# Patient Record
Sex: Male | Born: 1965 | Race: Black or African American | Hispanic: No | Marital: Single | Smoking: Former smoker
Health system: Southern US, Community
[De-identification: ages and names within clinical notes are randomized; demographics above are authoritative.]

## PROBLEM LIST (undated history)

## (undated) DIAGNOSIS — Z8679 Personal history of other diseases of the circulatory system: Secondary | ICD-10-CM

## (undated) DIAGNOSIS — I639 Cerebral infarction, unspecified: Secondary | ICD-10-CM

## (undated) DIAGNOSIS — I429 Cardiomyopathy, unspecified: Secondary | ICD-10-CM

## (undated) DIAGNOSIS — Z9889 Other specified postprocedural states: Secondary | ICD-10-CM

## (undated) DIAGNOSIS — I4729 Other ventricular tachycardia: Secondary | ICD-10-CM

## (undated) DIAGNOSIS — I10 Essential (primary) hypertension: Secondary | ICD-10-CM

## (undated) DIAGNOSIS — E785 Hyperlipidemia, unspecified: Secondary | ICD-10-CM

## (undated) HISTORY — DX: Cerebral infarction, unspecified: I63.9

## (undated) HISTORY — DX: Other ventricular tachycardia: I47.29

## (undated) HISTORY — DX: Cardiomyopathy, unspecified: I42.9

## (undated) HISTORY — DX: Personal history of other diseases of the circulatory system: Z98.890

## (undated) HISTORY — DX: Personal history of other diseases of the circulatory system: Z86.79

---

## 1998-10-24 ENCOUNTER — Emergency Department (HOSPITAL_COMMUNITY): Admission: EM | Admit: 1998-10-24 | Discharge: 1998-10-24 | Payer: Self-pay | Admitting: Emergency Medicine

## 1999-05-28 ENCOUNTER — Encounter: Payer: Self-pay | Admitting: Internal Medicine

## 1999-05-28 ENCOUNTER — Emergency Department (HOSPITAL_COMMUNITY): Admission: EM | Admit: 1999-05-28 | Discharge: 1999-05-28 | Payer: Self-pay | Admitting: Emergency Medicine

## 2010-05-07 ENCOUNTER — Ambulatory Visit (HOSPITAL_COMMUNITY)
Admission: RE | Admit: 2010-05-07 | Discharge: 2010-05-07 | Disposition: A | Discharge status: Home / Self Care | Payer: BC Managed Care – PPO | Source: Ambulatory Visit | Attending: Family Medicine | Admitting: Family Medicine

## 2010-05-07 ENCOUNTER — Other Ambulatory Visit (HOSPITAL_COMMUNITY): Payer: Self-pay | Admitting: Family Medicine

## 2010-05-07 DIAGNOSIS — R05 Cough: Secondary | ICD-10-CM

## 2010-05-07 DIAGNOSIS — R0600 Dyspnea, unspecified: Secondary | ICD-10-CM

## 2010-05-07 DIAGNOSIS — R059 Cough, unspecified: Secondary | ICD-10-CM

## 2010-05-07 DIAGNOSIS — R079 Chest pain, unspecified: Secondary | ICD-10-CM | POA: Insufficient documentation

## 2010-05-07 DIAGNOSIS — F17210 Nicotine dependence, cigarettes, uncomplicated: Secondary | ICD-10-CM

## 2010-05-07 DIAGNOSIS — R0602 Shortness of breath: Secondary | ICD-10-CM | POA: Insufficient documentation

## 2016-11-02 ENCOUNTER — Other Ambulatory Visit: Payer: Self-pay | Admitting: Podiatry

## 2016-11-04 NOTE — Patient Instructions (Addendum)
Erik Parker  11/04/2016     @   Your procedure is scheduled on  11/10/2016   Report to [facility] at  850  A.M.  Call this number if you have problems the morning of surgery:  [phone]   Remember:  Do not eat food or drink liquids after midnight.  Take these medicines the morning of surgery with A SIP OF WATER  Amlodipine   Do not wear jewelry, make-up or nail polish.  Do not wear lotions, powders, or perfumes, or deoderant.  Do not shave 48 hours prior to surgery.  Men may shave face and neck.  Do not bring valuables to the hospital.  [facility] is not responsible for any belongings or valuables.  Contacts, dentures or bridgework may not be worn into surgery.  Leave your suitcase in the car.  After surgery it may be brought to your room.  For patients admitted to the hospital, discharge time will be determined by your treatment team.  Patients discharged the day of surgery will not be allowed to drive home.   Name and phone number of your driver:   family Special instructions:  None  Please read over the following fact sheets that you were given. Anesthesia Post-op Instructions and Care and Recovery After Surgery       Heel Spur A heel spur is a bony growth that forms on the bottom of your heel bone (calcaneus). Heel spurs are common and do not always cause pain. However, heel spurs often cause inflammation in the strong band of tissue that runs underneath the bone of your foot (plantar fascia). When this happens, you may feel pain on the bottom of your foot, near your heel. What are the causes? The cause of heel spurs is not completely understood. They may be caused by pressure on the heel. Or, they may stem from the muscle attachments (tendons) near the spur pulling on the heel. What increases the risk? You may be at risk for a heel spur if you:  Are older than 40.  Are overweight.  Have wear and tear arthritis  (osteoarthritis).  Have plantar fascia inflammation.  What are the signs or symptoms? Some people have heel spurs but no symptoms. If you do have symptoms, they may include:  Pain in the bottom of your heel.  Pain that is worse when you first get out of bed.  Pain that gets worse after walking or standing.  How is this diagnosed? Your health care provider may diagnose a heel spur based on your symptoms and a physical exam. You may also have an X-ray of your foot to check for a bony growth coming from the calcaneus. How is this treated? Treatment aims to relieve the pain from the heel spur. This may include:  Stretching exercises.  Losing weight.  Wearing specific shoes, inserts, or orthotics for comfort and support.  Wearing splints at night to properly position your feet.  Taking over-the-counter medicine to relieve pain.  Being treated with high-intensity sound waves to break up the heel spur (extracorporeal shock wave therapy).  Getting steroid injections in your heel to reduce swelling and ease pain.  Having surgery if your heel spur causes long-term (chronic) pain.  Follow these instructions at home:  Take medicines only as directed by your health care provider.  Ask your health care provider if you should use ice or cold packs on the painful areas  of your heel or foot.  Avoid activities that cause you pain until you recover or as directed by your health care provider.  Stretch before exercising or being physically active.  Wear supportive shoes that fit well as directed by your health care provider. You might need to buy new shoes. Wearing old shoes or shoes that do not fit correctly may not provide the support that you need.  Lose weight if your health care provider thinks you should. This can relieve pressure on your foot that may be causing pain and discomfort. Contact a health care provider if:  Your pain continues or gets worse. This information is not  intended to replace advice given to you by your health care provider. Make sure you discuss any questions you have with your health care provider. Document Released: 03/24/2005 Document Revised: 07/24/2015 Document Reviewed: 04/18/2013 Elsevier Interactive Patient Education  2018 [employer].  [place] of the Foot, Care After This sheet gives you information about how to care for yourself after your procedure. Your health care provider may also give you more specific instructions. If you have problems or questions, contact your health care provider. What can I expect after the procedure? After the procedure, it is common to have:  Pain.  Swelling.  Stiffness.  Follow these instructions at home: If you have a splint or supportive shoe:  Wear the splint or shoe as told by your health care provider. Remove it only as told by your health care provider.  Loosen the splint or shoe if your toes tingle, become numb, or turn cold and blue.  Keep the splint or shoe clean.  If the splint or shoe is not waterproof: ? Do not let it get wet. ? Cover it with a watertight covering when you take a bath or a shower. If you have a cast:  Do not stick anything inside the cast to scratch your skin. Doing that increases your risk of infection.  Check the skin around the cast every day. Tell your health care provider about any concerns.  You may put lotion on dry skin around the edges of the cast. Do not put lotion on the skin underneath the cast.  Keep the cast clean.  If the cast is not waterproof: ? Do not let it get wet. ? Cover it with a watertight covering when you take a bath or a shower. Bathing  Do not take baths, swim, or use a hot tub until your health care provider approves. Ask your health care provider if you may take showers. You may only be allowed to take sponge baths for bathing.  If your cast or splint is not waterproof, cover it with a watertight covering when you take a  bath or a shower.  Keep the bandage (dressing) dry until your health care provider says it can be removed. Incision care  Follow instructions from your health care provider about how to take care of your incision. Make sure you: ? Wash your hands with soap and water before you change your bandage (dressing). If soap and water are not available, use hand sanitizer. ? Change your dressing as told by your health care provider. ? Leave stitches (sutures), skin glue, or adhesive strips in place. These skin closures may need to stay in place for 2 weeks or longer. If adhesive strip edges start to loosen and curl up, you may trim the loose edges. Do not remove adhesive strips completely unless your health care provider tells you to  do that.  Check your incision area every day for signs of infection. Check for: ? Redness, swelling, or pain. ? Fluid or blood. ? Warmth. ? Pus or a bad smell. Managing pain, stiffness, and swelling  If directed, put ice on the affected area. ? If you have a removable splint or shoe, remove it as told by your health care provider. ? Put ice in a plastic bag. ? Place a towel between your skin and the bag or between your cast and the bag. ? Leave the ice on for 20 minutes, 2-3 times a day.  Move your foot and toes often to avoid stiffness and to lessen swelling.  Raise (elevate) your foot area above the level of your heart while you are sitting or lying down. Driving  Do not drive or use heavy machinery while taking prescription pain medicine.  Ask your health care provider when it is safe to drive if you have a cast, splint, or supportive shoe on your foot. Activity  Return to your normal activities as told by your health care provider. Ask your health care provider what activities are safe for you.  Do exercises as told by your health care provider. Safety  Do not use your foot to support your body weight until your health care provider says that you  can.  Use your crutches or walker as told by your health care provider. General instructions  Do not put pressure on any part of the cast or splint until it is fully hardened. This may take several hours.  Do not use any products that contain nicotine or tobacco, such as cigarettes and e-cigarettes. These can delay bone healing. If you need help quitting, ask your health care provider.  Take over-the-counter and prescription medicines only as told by your health care provider.  Keep all follow-up visits as told by your health care provider. This is important. Contact a health care provider if:  You have chills or a fever.  Your pain is not controlled by your pain medicine.  You have redness, swelling, or pain around your incision.  You have fluid or blood coming from your incision.  Your incision feels warm to the touch.  You have pus or a bad smell coming from your incision. Get help right away if:  You have severe pain.  You have new pain, warmth, and swelling in your leg.  You have chest pain or difficulty breathing. Summary  After the procedure, it is common to have some pain, swelling, and stiffness.  Follow instructions from your health care provider about how to take care of your incision. Check the incision area every day for signs of infection.  Do not use your foot to support your body weight until your health care provider says that you can.  Move your foot and toes often to avoid stiffness and to lessen swelling. This information is not intended to replace advice given to you by your health care provider. Make sure you discuss any questions you have with your health care provider. Document Released: 04/06/2016 Document Revised: 04/06/2016 Document Reviewed: 04/06/2016 Elsevier Interactive Patient Education  2018 [employer]  Monitored Anesthesia Care Anesthesia is a term that refers to techniques, procedures, and medicines that help a person stay safe and  comfortable during a medical procedure. Monitored anesthesia care, or sedation, is one type of anesthesia. Your anesthesia specialist may recommend sedation if you will be having a procedure that does not require you to be unconscious, such as:  Cataract surgery.  A dental procedure.  A biopsy.  A colonoscopy.  During the procedure, you may receive a medicine to help you relax (sedative). There are three levels of sedation:  Mild sedation. At this level, you may feel awake and relaxed. You will be able to follow directions.  Moderate sedation. At this level, you will be sleepy. You may not remember the procedure.  Deep sedation. At this level, you will be asleep. You will not remember the procedure.  The more medicine you are given, the deeper your level of sedation will be. Depending on how you respond to the procedure, the anesthesia specialist may change your level of sedation or the type of anesthesia to fit your needs. An anesthesia specialist will monitor you closely during the procedure. Let your health care provider know about:  Any allergies you have.  All medicines you are taking, including vitamins, herbs, eye drops, creams, and over-the-counter medicines.  Any use of steroids (by mouth or as a cream).  Any problems you or family members have had with sedatives and anesthetic medicines.  Any blood disorders you have.  Any surgeries you have had.  Any medical conditions you have, such as sleep apnea.  Whether you are pregnant or may be pregnant.  Any use of cigarettes, alcohol, or street drugs. What are the risks? Generally, this is a safe procedure. However, problems may occur, including:  Getting too much medicine (oversedation).  Nausea.  Allergic reaction to medicines.  Trouble breathing. If this happens, a breathing tube may be used to help with breathing. It will be removed when you are awake and breathing on your own.  Heart trouble.  Lung  trouble.  Before the procedure Staying hydrated Follow instructions from your health care provider about hydration, which may include:  Up to 2 hours before the procedure - you may continue to drink clear liquids, such as water, clear fruit juice, black coffee, and plain tea.  Eating and drinking restrictions Follow instructions from your health care provider about eating and drinking, which may include:  8 hours before the procedure - stop eating heavy meals or foods such as meat, fried foods, or fatty foods.  6 hours before the procedure - stop eating light meals or foods, such as toast or cereal.  6 hours before the procedure - stop drinking milk or drinks that contain milk.  2 hours before the procedure - stop drinking clear liquids.  Medicines Ask your health care provider about:  Changing or stopping your regular medicines. This is especially important if you are taking diabetes medicines or blood thinners.  Taking medicines such as aspirin and ibuprofen. These medicines can thin your blood. Do not take these medicines before your procedure if your health care provider instructs you not to.  Tests and exams  You will have a physical exam.  You may have blood tests done to show: ? How well your kidneys and liver are working. ? How well your blood can clot.  General instructions  Plan to have someone take you home from the hospital or clinic.  If you will be going home right after the procedure, plan to have someone with you for 24 hours.  What happens during the procedure?  Your blood pressure, heart rate, breathing, level of pain and overall condition will be monitored.  An IV tube will be inserted into one of your veins.  Your anesthesia specialist will give you medicines as needed to keep you comfortable during the  procedure. This may mean changing the level of sedation.  The procedure will be performed. After the procedure  Your blood pressure, heart rate,  breathing rate, and blood oxygen level will be monitored until the medicines you were given have worn off.  Do not drive for 24 hours if you received a sedative.  You may: ? Feel sleepy, clumsy, or nauseous. ? Feel forgetful about what happened after the procedure. ? Have a sore throat if you had a breathing tube during the procedure. ? Vomit. This information is not intended to replace advice given to you by your health care provider. Make sure you discuss any questions you have with your health care provider. Document Released: 11/11/2004 Document Revised: 07/25/2015 Document Reviewed: 06/08/2015 Elsevier Interactive Patient Education  2018 [employer] Monitored Anesthesia Care, Care After These instructions provide you with information about caring for yourself after your procedure. Your health care provider may also give you more specific instructions. Your treatment has been planned according to current medical practices, but problems sometimes occur. Call your health care provider if you have any problems or questions after your procedure. What can I expect after the procedure? After your procedure, it is common to:  Feel sleepy for several hours.  Feel clumsy and have poor balance for several hours.  Feel forgetful about what happened after the procedure.  Have poor judgment for several hours.  Feel nauseous or vomit.  Have a sore throat if you had a breathing tube during the procedure.  Follow these instructions at home: For at least 24 hours after the procedure:   Do not: ? Participate in activities in which you could fall or become injured. ? Drive. ? Use heavy machinery. ? Drink alcohol. ? Take sleeping pills or medicines that cause drowsiness. ? Make important decisions or sign legal documents. ? Take care of children on your own.  Rest. Eating and drinking  Follow the diet that is recommended by your health care provider.  If you vomit, drink water,  juice, or soup when you can drink without vomiting.  Make sure you have little or no nausea before eating solid foods. General instructions  Have a responsible adult stay with you until you are awake and alert.  Take over-the-counter and prescription medicines only as told by your health care provider.  If you smoke, do not smoke without supervision.  Keep all follow-up visits as told by your health care provider. This is important. Contact a health care provider if:  You keep feeling nauseous or you keep vomiting.  You feel light-headed.  You develop a rash.  You have a fever. Get help right away if:  You have trouble breathing. This information is not intended to replace advice given to you by your health care provider. Make sure you discuss any questions you have with your health care provider. Document Released: 06/08/2015 Document Revised: 10/08/2015 Document Reviewed: 06/08/2015 Elsevier Interactive Patient Education  [employer].

## 2016-11-08 ENCOUNTER — Ambulatory Visit (HOSPITAL_COMMUNITY)
Admission: RE | Admit: 2016-11-08 | Discharge: 2016-11-08 | Disposition: A | Discharge status: Home / Self Care | Payer: No Typology Code available for payment source | Source: Ambulatory Visit | Attending: Podiatry | Admitting: Podiatry

## 2016-11-08 ENCOUNTER — Encounter (HOSPITAL_COMMUNITY): Payer: Self-pay

## 2016-11-08 ENCOUNTER — Encounter (HOSPITAL_COMMUNITY)
Admission: RE | Admit: 2016-11-08 | Discharge: 2016-11-08 | Disposition: A | Discharge status: Home / Self Care | Payer: No Typology Code available for payment source | Source: Ambulatory Visit | Attending: Podiatry | Admitting: Podiatry

## 2016-11-08 DIAGNOSIS — Z01810 Encounter for preprocedural cardiovascular examination: Secondary | ICD-10-CM | POA: Diagnosis not present

## 2016-11-08 DIAGNOSIS — Z01812 Encounter for preprocedural laboratory examination: Secondary | ICD-10-CM | POA: Diagnosis present

## 2016-11-08 DIAGNOSIS — M19071 Primary osteoarthritis, right ankle and foot: Secondary | ICD-10-CM | POA: Diagnosis not present

## 2016-11-08 DIAGNOSIS — R52 Pain, unspecified: Secondary | ICD-10-CM

## 2016-11-08 DIAGNOSIS — Z01818 Encounter for other preprocedural examination: Secondary | ICD-10-CM

## 2016-11-08 HISTORY — DX: Essential (primary) hypertension: I10

## 2016-11-08 HISTORY — DX: Hyperlipidemia, unspecified: E78.5

## 2016-11-08 LAB — CBC WITH DIFFERENTIAL/PLATELET
Basophils Absolute: 0 10*3/uL (ref 0.0–0.1)
Basophils Relative: 0 %
EOS PCT: 0 %
Eosinophils Absolute: 0 10*3/uL (ref 0.0–0.7)
HEMATOCRIT: 45 % (ref 39.0–52.0)
Hemoglobin: 15.1 g/dL (ref 13.0–17.0)
LYMPHS PCT: 31 %
Lymphs Abs: 2.7 10*3/uL (ref 0.7–4.0)
MCH: 29.3 pg (ref 26.0–34.0)
MCHC: 33.6 g/dL (ref 30.0–36.0)
MCV: 87.4 fL (ref 78.0–100.0)
MONO ABS: 0.4 10*3/uL (ref 0.1–1.0)
Monocytes Relative: 5 %
NEUTROS ABS: 5.6 10*3/uL (ref 1.7–7.7)
Neutrophils Relative %: 64 %
PLATELETS: 366 10*3/uL (ref 150–400)
RBC: 5.15 MIL/uL (ref 4.22–5.81)
RDW: 13.4 % (ref 11.5–15.5)
WBC: 8.7 10*3/uL (ref 4.0–10.5)

## 2016-11-08 LAB — BASIC METABOLIC PANEL
Anion gap: 12 (ref 5–15)
BUN: 16 mg/dL (ref 6–20)
CO2: 24 mmol/L (ref 22–32)
Calcium: 9.4 mg/dL (ref 8.9–10.3)
Chloride: 100 mmol/L — ABNORMAL LOW (ref 101–111)
Creatinine, Ser: 0.85 mg/dL (ref 0.61–1.24)
GFR calc Af Amer: >60 mL/min (ref >60)
GLUCOSE: 138 mg/dL — AB (ref 65–99)
Potassium: 3.5 mmol/L (ref 3.5–5.1)
Sodium: 136 mmol/L (ref 135–145)

## 2016-11-08 MED ORDER — CEFAZOLIN SODIUM-DEXTROSE 2-4 GM/100ML-% IV SOLN: 2.0000 g | INTRAVENOUS | Status: DC

## 2016-11-10 ENCOUNTER — Ambulatory Visit (HOSPITAL_COMMUNITY): Payer: Managed Care, Other (non HMO) | Admitting: Anesthesiology

## 2016-11-10 ENCOUNTER — Ambulatory Visit (HOSPITAL_COMMUNITY)
Admission: RE | Admit: 2016-11-10 | Discharge: 2016-11-10 | Disposition: A | Discharge status: Home / Self Care | Payer: Managed Care, Other (non HMO) | Source: Ambulatory Visit | Attending: Podiatry | Admitting: Podiatry

## 2016-11-10 ENCOUNTER — Encounter (HOSPITAL_COMMUNITY)
Admission: RE | Disposition: A | Discharge status: Home / Self Care | Payer: Self-pay | Source: Ambulatory Visit | Attending: Podiatry

## 2016-11-10 ENCOUNTER — Encounter (HOSPITAL_COMMUNITY): Payer: Self-pay | Admitting: *Deleted

## 2016-11-10 ENCOUNTER — Ambulatory Visit (HOSPITAL_COMMUNITY): Payer: Managed Care, Other (non HMO)

## 2016-11-10 DIAGNOSIS — Z9889 Other specified postprocedural states: Secondary | ICD-10-CM

## 2016-11-10 DIAGNOSIS — Z87891 Personal history of nicotine dependence: Secondary | ICD-10-CM | POA: Insufficient documentation

## 2016-11-10 DIAGNOSIS — M7661 Achilles tendinitis, right leg: Secondary | ICD-10-CM | POA: Insufficient documentation

## 2016-11-10 DIAGNOSIS — I10 Essential (primary) hypertension: Secondary | ICD-10-CM | POA: Diagnosis not present

## 2016-11-10 DIAGNOSIS — M9261 Juvenile osteochondrosis of tarsus, right ankle: Secondary | ICD-10-CM | POA: Diagnosis not present

## 2016-11-10 DIAGNOSIS — M7731 Calcaneal spur, right foot: Secondary | ICD-10-CM | POA: Insufficient documentation

## 2016-11-10 HISTORY — PX: ACHILLES TENDON SURGERY: SHX542

## 2016-11-10 HISTORY — PX: HEEL SPUR RESECTION: SHX6410

## 2016-11-10 SURGERY — EXCISION, BONE SPUR, CALCANEUS
Anesthesia: General | Laterality: Right

## 2016-11-10 MED ORDER — LACTATED RINGERS IV SOLN
INTRAVENOUS | Status: DC
  Administered 2016-11-10 (×3): via INTRAVENOUS

## 2016-11-10 MED ORDER — FENTANYL CITRATE (PF) 100 MCG/2ML IJ SOLN
INTRAMUSCULAR | Status: AC
  Filled 2016-11-10: qty 4

## 2016-11-10 MED ORDER — CEFAZOLIN SODIUM-DEXTROSE 1-4 GM/50ML-% IV SOLN
INTRAVENOUS | Status: AC
  Filled 2016-11-10: qty 50

## 2016-11-10 MED ORDER — LIDOCAINE HCL (PF) 1 % IJ SOLN
INTRAMUSCULAR | Status: AC
  Filled 2016-11-10: qty 30

## 2016-11-10 MED ORDER — ROCURONIUM BROMIDE 50 MG/5ML IV SOLN
INTRAVENOUS | Status: AC
  Filled 2016-11-10: qty 1

## 2016-11-10 MED ORDER — CEFAZOLIN SODIUM-DEXTROSE 2-4 GM/100ML-% IV SOLN
2.0000 g | Freq: Once | INTRAVENOUS | Status: AC
  Administered 2016-11-10: 1 g via INTRAVENOUS
  Administered 2016-11-10: 2 g via INTRAVENOUS
  Filled 2016-11-10: qty 100

## 2016-11-10 MED ORDER — FENTANYL CITRATE (PF) 100 MCG/2ML IJ SOLN
INTRAMUSCULAR | Status: DC | PRN
  Administered 2016-11-10 (×8): 50 ug via INTRAVENOUS

## 2016-11-10 MED ORDER — ONDANSETRON HCL 4 MG/2ML IJ SOLN
4.0000 mg | Freq: Once | INTRAMUSCULAR | Status: AC
  Administered 2016-11-10: 4 mg via INTRAVENOUS

## 2016-11-10 MED ORDER — FENTANYL CITRATE (PF) 100 MCG/2ML IJ SOLN
INTRAMUSCULAR | Status: AC
  Filled 2016-11-10: qty 2

## 2016-11-10 MED ORDER — LIDOCAINE HCL 1 % IJ SOLN
INTRAMUSCULAR | Status: DC | PRN
  Administered 2016-11-10: 18 mL

## 2016-11-10 MED ORDER — FENTANYL CITRATE (PF) 100 MCG/2ML IJ SOLN
25.0000 ug | INTRAMUSCULAR | Status: DC | PRN
  Administered 2016-11-10: 50 ug via INTRAVENOUS
  Filled 2016-11-10: qty 2

## 2016-11-10 MED ORDER — PROPOFOL 10 MG/ML IV BOLUS
INTRAVENOUS | Status: AC
  Filled 2016-11-10: qty 20

## 2016-11-10 MED ORDER — DEXTROSE 5 % IV SOLN: 2000.0000 mg | Freq: Once | INTRAVENOUS | Status: DC

## 2016-11-10 MED ORDER — PROPOFOL 10 MG/ML IV BOLUS
INTRAVENOUS | Status: DC | PRN
  Administered 2016-11-10: 50 mg via INTRAVENOUS
  Administered 2016-11-10: 200 mg via INTRAVENOUS

## 2016-11-10 MED ORDER — ROCURONIUM BROMIDE 100 MG/10ML IV SOLN
INTRAVENOUS | Status: DC | PRN
  Administered 2016-11-10: 10 mg via INTRAVENOUS
  Administered 2016-11-10: 30 mg via INTRAVENOUS
  Administered 2016-11-10: 10 mg via INTRAVENOUS

## 2016-11-10 MED ORDER — MIDAZOLAM HCL 2 MG/2ML IJ SOLN
INTRAMUSCULAR | Status: AC
  Filled 2016-11-10: qty 2

## 2016-11-10 MED ORDER — ONDANSETRON HCL 4 MG/2ML IJ SOLN
INTRAMUSCULAR | Status: AC
  Filled 2016-11-10: qty 2

## 2016-11-10 MED ORDER — SEVOFLURANE IN SOLN
RESPIRATORY_TRACT | Status: AC
  Filled 2016-11-10: qty 250

## 2016-11-10 MED ORDER — SUGAMMADEX SODIUM 200 MG/2ML IV SOLN
INTRAVENOUS | Status: DC | PRN
  Administered 2016-11-10: 245.8 mg via INTRAVENOUS

## 2016-11-10 MED ORDER — GLYCOPYRROLATE 0.2 MG/ML IJ SOLN
INTRAMUSCULAR | Status: DC | PRN
  Administered 2016-11-10: 0.2 mg via INTRAVENOUS

## 2016-11-10 MED ORDER — SUCCINYLCHOLINE CHLORIDE 20 MG/ML IJ SOLN
INTRAMUSCULAR | Status: DC | PRN
  Administered 2016-11-10: 180 mg via INTRAVENOUS

## 2016-11-10 MED ORDER — BUPIVACAINE HCL (PF) 0.5 % IJ SOLN
INTRAMUSCULAR | Status: AC
  Filled 2016-11-10: qty 30

## 2016-11-10 MED ORDER — MIDAZOLAM HCL 2 MG/2ML IJ SOLN
1.0000 mg | INTRAMUSCULAR | Status: AC
  Administered 2016-11-10 (×2): 2 mg via INTRAVENOUS
  Filled 2016-11-10: qty 2

## 2016-11-10 MED ORDER — SUGAMMADEX SODIUM 500 MG/5ML IV SOLN
INTRAVENOUS | Status: AC
  Filled 2016-11-10: qty 5

## 2016-11-10 SURGICAL SUPPLY — 57 items
BAG HAMPER (MISCELLANEOUS) ×3 IMPLANT
BANDAGE ACE 4 STERILE (GAUZE/BANDAGES/DRESSINGS) ×6 IMPLANT
BANDAGE ACE 6X5 VEL STRL LF (GAUZE/BANDAGES/DRESSINGS) ×3 IMPLANT
BANDAGE ELASTIC 4 VELCRO NS (GAUZE/BANDAGES/DRESSINGS) IMPLANT
BANDAGE ESMARK 4X12 BL STRL LF (DISPOSABLE) ×1 IMPLANT
BENZOIN TINCTURE PRP APPL 2/3 (GAUZE/BANDAGES/DRESSINGS) ×3 IMPLANT
BLADE 15 SAFETY STRL DISP (BLADE) IMPLANT
BLADE AVERAGE 25MMX9MM (BLADE) ×1
BLADE AVERAGE 25X9 (BLADE) ×2 IMPLANT
BLADE OSC/SAG 18.5X9 THN (BLADE) IMPLANT
BNDG CONFORM 2 STRL LF (GAUZE/BANDAGES/DRESSINGS) IMPLANT
BNDG ESMARK 4X12 BLUE STRL LF (DISPOSABLE) ×3
BNDG GAUZE ELAST 4 BULKY (GAUZE/BANDAGES/DRESSINGS) ×3 IMPLANT
BNDG GAUZE ROLL STR 2.25X3YD (GAUZE/BANDAGES/DRESSINGS) ×3 IMPLANT
BOOT STEPPER DURA LG (SOFTGOODS) IMPLANT
BOOT STEPPER DURA MED (SOFTGOODS) IMPLANT
BOOT STEPPER DURA SM (SOFTGOODS) IMPLANT
BOOT STEPPER DURA XLG (SOFTGOODS) ×3 IMPLANT
CHLORAPREP W/TINT 26ML (MISCELLANEOUS) ×3 IMPLANT
CLOSURE WOUND 1/2 X4 (GAUZE/BANDAGES/DRESSINGS)
CLOTH BEACON ORANGE TIMEOUT ST (SAFETY) ×3 IMPLANT
COVER LIGHT HANDLE STERIS (MISCELLANEOUS) ×6 IMPLANT
CUFF TOURNIQUET SINGLE 18IN (TOURNIQUET CUFF) IMPLANT
CUFF TOURNIQUET SINGLE 34IN LL (TOURNIQUET CUFF) ×3 IMPLANT
DECANTER SPIKE VIAL GLASS SM (MISCELLANEOUS) ×6 IMPLANT
DRAPE OEC MINIVIEW 54X84 (DRAPES) ×3 IMPLANT
DRSG ADAPTIC 3X8 NADH LF (GAUZE/BANDAGES/DRESSINGS) ×3 IMPLANT
ELECT REM PT RETURN 9FT ADLT (ELECTROSURGICAL) ×3
ELECTRODE REM PT RTRN 9FT ADLT (ELECTROSURGICAL) ×1 IMPLANT
GAUZE SPONGE 4X4 12PLY STRL (GAUZE/BANDAGES/DRESSINGS) ×3 IMPLANT
GAUZE SPONGE 4X4 12PLY STRL LF (GAUZE/BANDAGES/DRESSINGS) ×3 IMPLANT
GLOVE BIO SURGEON STRL SZ7.5 (GLOVE) ×6 IMPLANT
GLOVE BIOGEL PI IND STRL 7.0 (GLOVE) ×1 IMPLANT
GLOVE BIOGEL PI INDICATOR 7.0 (GLOVE) ×2
GOWN STRL REUS W/TWL LRG LVL3 (GOWN DISPOSABLE) ×12 IMPLANT
IMPL SYS BIOCOMP ACH SPEED (Anchor) ×1 IMPLANT
IMPLANT SYS BIOCOMP ACH SPEED (Anchor) ×3 IMPLANT
KIT ROOM TURNOVER AP CYSTO (KITS) IMPLANT
KIT ROOM TURNOVER APOR (KITS) ×3 IMPLANT
MANIFOLD NEPTUNE II (INSTRUMENTS) ×3 IMPLANT
NEEDLE HYPO 18GX1.5 BLUNT FILL (NEEDLE) IMPLANT
NEEDLE HYPO 27GX1-1/4 (NEEDLE) IMPLANT
NS IRRIG 1000ML POUR BTL (IV SOLUTION) ×3 IMPLANT
PACK BASIC LIMB (CUSTOM PROCEDURE TRAY) ×3 IMPLANT
PAD ARMBOARD 7.5X6 YLW CONV (MISCELLANEOUS) ×3 IMPLANT
RASP SM TEAR CROSS CUT (RASP) ×3 IMPLANT
SET BASIN LINEN APH (SET/KITS/TRAYS/PACK) ×3 IMPLANT
SPONGE LAP 18X18 X RAY DECT (DISPOSABLE) ×3 IMPLANT
STAPLER VISISTAT (STAPLE) ×3 IMPLANT
STRIP CLOSURE SKIN 1/2X4 (GAUZE/BANDAGES/DRESSINGS) IMPLANT
SUT ETHIBOND 3 0 (SUTURE) IMPLANT
SUT PROLENE 4 0 PS 2 18 (SUTURE) ×6 IMPLANT
SUT VIC AB 2-0 CT2 27 (SUTURE) ×3 IMPLANT
SUT VIC AB 4-0 PS2 27 (SUTURE) ×3 IMPLANT
SUT VICRYL AB 3-0 FS1 BRD 27IN (SUTURE) ×3 IMPLANT
SYR CONTROL 10ML LL (SYRINGE) ×9 IMPLANT
TOWEL OR 17X26 4PK STRL BLUE (TOWEL DISPOSABLE) ×3 IMPLANT

## 2016-11-10 NOTE — Op Note (Signed)
2:03 PM  PATIENT:  Erik Parker  51 y.o. male  PRE-OPERATIVE DIAGNOSIS:  painful haglunds deformity right foot, achilles tendonitis right foot, retrocalcaneal heel spur right foot  POST-OPERATIVE DIAGNOSIS:  painful haglunds deformity right foot, achilles tendonitis right foot, retrocalcaneal heel spur right foot  PROCEDURE:  Procedure(s): HEEL SPUR RESECTION (HAGLUNDS DEFORMITY) (Right) ACHILLES TENDON REPAIR VIA SPEED BRIDGE (Right)  SURGEON:  Surgeon(s) and Role:    * [name], DPM - Primary    * Ferman Hamming, DPM - Assisting  PHYSICIAN ASSISTANT:   ASSISTANTS: none   ANESTHESIA:   general  EBL:  Total I/O In: 1600 [I.V.:1600] Out: 2 [Blood:2]  BLOOD ADMINISTERED:none  DRAINS: none   LOCAL MEDICATIONS USED:  BUPIVICAINE   SPECIMEN:  No Specimen  DISPOSITION OF SPECIMEN:  N/A  COUNTS:  YES  TOURNIQUET:   Total Tourniquet Time Documented: area (laterality) - 105 minutes Total: area (laterality) - 105 minutes   DICTATION: .Dragon Dictation  PLAN OF CARE: Discharge to home after PACU  PATIENT DISPOSITION:  PACU - hemodynamically stable.   Delay start of Pharmacological VTE agent (>24hrs) due to surgical blood loss or risk of bleeding: no  Patient was brought into the operating room. Following IV sedation and General anesthesia patient was laid prone on to the operating table. Care was made to pad all the bony prominence.  Thigh tourniquet was applied to the surgical extremity. The foot and ankle were the prepped, scrubbed and draped in aseptic manner. Using an esmarch band the tourniquet on the surgical site was inflatted at .   Attention was directed toward right posterior heel. There was prominent bump visualized with skin discoloration at the posterior superior aspect of the calcaneus. A midline incision approach was planned. A cm midline incision to achilles tendon was made down to skin and subcutaneous tissue with full flap at the  inferior aspect of the incision. Care was made to preserve the paratenon. Next the paratenon was dissected off carefully from achilles tendon. There was fragments of bony exostosis felt with the tendon and most inferior part of the incision. The achilles tendon was now visualized and thickened at the most inferior part. Achilles tendon was split in midline to the incision in full thickness, from posterior to anterior.  All tendinopathic tissue was removed from the tendon and around the tendon.  Achilles tendon distally was now reflected medially and laterally and expose the whole calcaneal tuberosity with a Haglund's prominence. Care was maintained to save medial  and lateral attachments to assist with the accurate restoration of the Achilles' length. At this time the Haglund's prominence using a microsagittal saw. Power rasp was used to rasp down any bony prominence. Fluoroscopy was used to see if any bony prominent left. At this time surgical site was flushed with normal saline.  At this bone was ready to insert the speedbride and reattach the achilles tendon.  Two (2) 4.75 mm BioComposite SwiveLock anchors were drilled about 1cm apart from each other using drill guide from the set. The 2 holes were about 1 cm proximal to the distal insertion of the Achilles tendon and central to each half of the tendon. Then 4.75 mm SwiveLock tap was used to prepare the holes for the 4.75 mm SwiveLock anchors. Inserted two (2) 4.75 mm BioComposite SwiveLock anchors loaded with FiberTape suture, 1 blue and 1 white/black, into the proximal holes. Anchors were noted to be flushed with bone. The anchor driver was then removed and needle attached to  the 2mm fiber tape were sutured through the achilles tendon on each side. At this time the distal holes were drilled with 3.5 mm drill in the same manner as the proximal holes. Holes were tapped using 4.6275mm SwiveLock anchors. After cutting the swedged portion on each proximal  anchor, retrieved 1 FiberTape suture tail from each proximal anchor (1 blue and 1 white/black) and preloaded them through the distal SwiveLock anchor eyelet. Tension of the FiberTape suture was adjusted and inserted the 4.75 mm SwiveLock anchor into the prepared distal bone socket until the anchor body contacts bone. Anchors were flushed to the bone. The tails on the distal row were cut and anchor was flushed to the bone. Fluoroscopy was used to confirm the position of the anchors. Tendon was firmly attached and strength was tested on the table. Remaining achilles tendon was suture using 2-0 fiber tape from the speedbridge set. The paratenon was closed using 4-0 Vicryl in running fashion. Sub cuticular stiches were done to close the skin using 3-0 Vicryl. Staples were also used for the skin. 18cc of mixture of 1:1 1% plain lidocaine with 0.5%marcaine was injected into the surgical site. Dry sterile dressing was applied. Fibreglass posterior splint was applied to the surgical extremity in plantar flexion position. Tourniquet was deflated. Capillary refill was immediate to all the toes. Patient was extubated and transferred to PACU. Vitals are stable. Patient to be nonweightbearing for 3 weeks and using crutches on surgical extremity.

## 2016-11-10 NOTE — Anesthesia Preprocedure Evaluation (Signed)
Anesthesia Evaluation  Patient identified by MRN, date of birth, ID band Patient awake    Reviewed: Allergy & Precautions, NPO status , Patient's Chart, lab work & pertinent test results  Airway Mallampati: II  TM Distance: >3 FB     Dental  (+) Teeth Intact   Pulmonary former smoker,    breath sounds clear to auscultation       Cardiovascular hypertension, Pt. on medications  Rhythm:Regular Rate:Normal     Neuro/Psych negative neurological ROS  negative psych ROS   GI/Hepatic negative GI ROS, Neg liver ROS,   Endo/Other  negative endocrine ROS  Renal/GU negative Renal ROS     Musculoskeletal   Abdominal   Peds  Hematology negative hematology ROS (+)   Anesthesia Other Findings   Reproductive/Obstetrics                             Anesthesia Physical Anesthesia Plan  ASA: II  Anesthesia Plan: General   Post-op Pain Management:    Induction: Intravenous  PONV Risk Score and Plan:   Airway Management Planned: Oral ETT  Additional Equipment:   Intra-op Plan:   Post-operative Plan: Extubation in OR  Informed Consent: I have reviewed the patients History and Physical, chart, labs and discussed the procedure including the risks, benefits and alternatives for the proposed anesthesia with the patient or authorized representative who has indicated his/her understanding and acceptance.     Plan Discussed with:   Anesthesia Plan Comments: (Prone position)        Anesthesia Quick Evaluation

## 2016-11-10 NOTE — Anesthesia Procedure Notes (Signed)
Procedure Name: Intubation Date/Time: 11/10/2016 11:17 AM Performed by: Pernell DupreADAMS, Pattricia Weiher A Pre-anesthesia Checklist: Patient identified, Patient being monitored, Timeout performed, Emergency Drugs available and Suction available Patient Re-evaluated:Patient Re-evaluated prior to induction Oxygen Delivery Method: Circle System Utilized Preoxygenation: Pre-oxygenation with 100% oxygen Induction Type: IV induction, Rapid sequence and Cricoid Pressure applied Laryngoscope Size: Miller and 3 Grade View: Grade III Tube type: Oral Tube size: 7.0 mm Number of attempts: 1 Airway Equipment and Method: Stylet Placement Confirmation: ETT inserted through vocal cords under direct vision,  positive ETCO2 and breath sounds checked- equal and bilateral Secured at: 21 cm Tube secured with: Tape Dental Injury: Teeth and Oropharynx as per pre-operative assessment

## 2016-11-10 NOTE — Transfer of Care (Signed)
Immediate Anesthesia Transfer of Care Note  Patient: Erik Parker  Procedure(s) Performed: Procedure(s): HEEL SPUR RESECTION (HAGLUNDS DEFORMITY) (Right) ACHILLES TENDON REPAIR VIA SPEED BRIDGE (Right)  Patient Location: PACU  Anesthesia Type:General  Level of Consciousness: awake, oriented and patient cooperative  Airway & Oxygen Therapy: Patient Spontanous Breathing and Patient connected to nasal cannula oxygen  Post-op Assessment: Report given to RN and Post -op Vital signs reviewed and stable  Post vital signs: Reviewed and stable  Last Vitals:  Vitals:   11/10/16 1100 11/10/16 1105  BP: 124/81 120/79  Pulse:    Resp: (!) 31 (!) 24  Temp:    SpO2: 98% 97%    Last Pain:  Vitals:   11/10/16 0935  TempSrc: Oral  PainSc: 6          Complications: No apparent anesthesia complications

## 2016-11-10 NOTE — H&P (Signed)
.   HISTORY AND PHYSICAL INTERVAL NOTE:  11/10/2016  10:21 AM  Leonides Grillsony V Hufnagle  has presented today for surgery, with the diagnosis of painful haglunds deformity right foot, achilles tendonitis right foot, retrocalcaneal heel spur right foot.  The various methods of treatment have been discussed with the patient.  No guarantees were given.  After consideration of risks, benefits and other options for treatment, the patient has consented to surgery.  I have reviewed the patients' chart and labs.    Patient Vitals for the past 24 hrs:  BP Temp Temp src Pulse SpO2 Height Weight  11/10/16 0935 132/86 98.5 F (36.9 C) Oral 82 98 % 6' (1.829 m) 122.9 kg (271 lb)    A history and physical examination was performed in my office.  The patient was reexamined.  There have been no changes to this history and physical examination.  Erskine EmeryPrayashkumar Gaspard Isbell, DPM

## 2016-11-10 NOTE — Discharge Instructions (Signed)
These instructions will give you an idea of what to expect after surgery and how to manage issues that may arise before your first post op office visit. ° °Pain Management °Pain is best managed by “staying ahead” of it. If pain gets out of control, it is difficult to get it back under control. Local anesthesia that lasts 6-8 hours is used to numb the foot and decrease pain.  For the best pain control, take the pain medication every 4 hours for the first 2 days post op. On the third day pain medication can be taken as needed.  ° °Post Op Nausea °Nausea is common after surgery, so it is managed proactively.  °If prescribed, use the prescribed nausea medication regularly for the first 2 days post op. ° °Bandages °Do not worry if there is blood on the bandage. What looks like a lot of blood on the bandage is actually a small amount. Blood on the dressing spreads out as it is absorbed by the gauze, the same way a drop of water spreads out on a paper towel.  °If the bandages feel wet or dry, stiff and uncomfortable, call the office during office hours and we will schedule a time for you to have the bandage changed.  °Unless you are specifically told otherwise, we will do the first bandage change in the office.  °Keep your bandage dry. If the bandage becomes wet or soiled, notify the office and we will schedule a time to change the bandage. ° °Activity °It is best to spend most of the first 2 days after surgery lying down with the foot elevated above the level of your heart. °You may put weight on your heel while wearing the surgical shoe.   °You may only get up to go to the restroom. ° °Driving °Do not drive until you are able to respond in an emergency (i.e. slam on the brakes). This usually occurs after the bone has healed - 6 to 8 weeks. ° °Call the Office °If you have a fever over 101°F.  °If you have increasing pain after the initial post op pain has settled down.  °If you have increasing redness, swelling, or  drainage.  °If you have any questions or concerns.  ° ° °PATIENT INSTRUCTIONS °POST-ANESTHESIA ° °IMMEDIATELY FOLLOWING SURGERY:  Do not drive or operate machinery for the first twenty four hours after surgery.  Do not make any important decisions for twenty four hours after surgery or while taking narcotic pain medications or sedatives.  If you develop intractable nausea and vomiting or a severe headache please notify your doctor immediately. ° °FOLLOW-UP:  Please make an appointment with your surgeon as instructed. You do not need to follow up with anesthesia unless specifically instructed to do so. ° °WOUND CARE INSTRUCTIONS (if applicable):  Keep a dry clean dressing on the anesthesia/puncture wound site if there is drainage.  Once the wound has quit draining you may leave it open to air.  Generally you should leave the bandage intact for twenty four hours unless there is drainage.  If the epidural site drains for more than 36-48 hours please call the anesthesia department. ° °QUESTIONS?:  Please feel free to call your physician or the hospital operator if you have any questions, and they will be happy to assist you.    ° ° ° °

## 2016-11-10 NOTE — Brief Op Note (Signed)
11/10/2016  2:03 PM  PATIENT:  Erik Parker  51 y.o. male  PRE-OPERATIVE DIAGNOSIS:  painful haglunds deformity right foot, achilles tendonitis right foot, retrocalcaneal heel spur right foot  POST-OPERATIVE DIAGNOSIS:  painful haglunds deformity right foot, achilles tendonitis right foot, retrocalcaneal heel spur right foot  PROCEDURE:  Procedure(s): HEEL SPUR RESECTION (HAGLUNDS DEFORMITY) (Right) ACHILLES TENDON REPAIR VIA SPEED BRIDGE (Right)  SURGEON:  Surgeon(s) and Role:    * Erskine EmeryPatel, Kaitlyn Skowron, DPM - Primary    * Ferman HammingMcKinney, Benjamin, DPM - Assisting  PHYSICIAN ASSISTANT:   ASSISTANTS: none   ANESTHESIA:   general  EBL:  Total I/O In: 1600 [I.V.:1600] Out: 2 [Blood:2]  BLOOD ADMINISTERED:none  DRAINS: none   LOCAL MEDICATIONS USED:  BUPIVICAINE   SPECIMEN:  No Specimen  DISPOSITION OF SPECIMEN:  N/A  COUNTS:  YES  TOURNIQUET:   Total Tourniquet Time Documented: area (laterality) - 105 minutes Total: area (laterality) - 105 minutes   DICTATION: .Dragon Dictation  PLAN OF CARE: Discharge to home after PACU  PATIENT DISPOSITION:  PACU - hemodynamically stable.   Delay start of Pharmacological VTE agent (>24hrs) due to surgical blood loss or risk of bleeding: no

## 2016-11-10 NOTE — Anesthesia Postprocedure Evaluation (Signed)
Anesthesia Post Note Late Entry 1440  Patient: Erik Parker  Procedure(s) Performed: Procedure(s) (LRB): HEEL SPUR RESECTION (HAGLUNDS DEFORMITY) (Right) ACHILLES TENDON REPAIR VIA SPEED BRIDGE (Right)  Patient location during evaluation: PACU Anesthesia Type: General Level of consciousness: awake and alert, oriented and patient cooperative Pain management: pain level controlled Vital Signs Assessment: post-procedure vital signs reviewed and stable Respiratory status: spontaneous breathing Cardiovascular status: stable Postop Assessment: no signs of nausea or vomiting Anesthetic complications: no     Last Vitals:  Vitals:   11/10/16 1430 11/10/16 1445  BP: 134/86 136/82  Pulse: 79 78  Resp: 20 17  Temp:    SpO2: 95% 97%    Last Pain:  Vitals:   11/10/16 1445  TempSrc:   PainSc: 4                  [name] A

## 2016-11-10 NOTE — Brief Op Note (Signed)
11/10/2016  2:01 PM  PATIENT:  Erik Parker  51 y.o. male  PRE-OPERATIVE DIAGNOSIS:  painful haglunds deformity right foot, achilles tendonitis right foot, retrocalcaneal heel spur right foot  POST-OPERATIVE DIAGNOSIS:  painful haglunds deformity right foot, achilles tendonitis right foot, retrocalcaneal heel spur right foot  PROCEDURE:  Procedure(s): HEEL SPUR RESECTION (HAGLUNDS DEFORMITY) (Right) ACHILLES TENDON REPAIR VIA SPEED BRIDGE (Right)  SURGEON:  Surgeon(s) and Role:    * Erskine EmeryPatel, Sinthia Karabin, DPM - Primary    * Ferman HammingMcKinney, Benjamin, DPM - Assisting  PHYSICIAN ASSISTANT:   ASSISTANTS: none   ANESTHESIA:   general  EBL:  Total I/O In: 1600 [I.V.:1600] Out: 2 [Blood:2]  BLOOD ADMINISTERED:none  DRAINS: none   LOCAL MEDICATIONS USED:  BUPIVICAINE   SPECIMEN:  No Specimen  DISPOSITION OF SPECIMEN:  N/A  COUNTS:  YES  TOURNIQUET:   Total Tourniquet Time Documented: area (laterality) - 105 minutes Total: area (laterality) - 105 minutes   DICTATION: .Dragon Dictation  PLAN OF CARE: Discharge to home after PACU  PATIENT DISPOSITION:  PACU - hemodynamically stable.   Delay start of Pharmacological VTE agent (>24hrs) due to surgical blood loss or risk of bleeding: no

## 2016-11-11 ENCOUNTER — Encounter (HOSPITAL_COMMUNITY): Payer: Self-pay | Admitting: Podiatry

## 2018-11-20 ENCOUNTER — Emergency Department (HOSPITAL_COMMUNITY)
Admission: EM | Admit: 2018-11-20 | Discharge: 2018-11-20 | Disposition: A | Discharge status: Home / Self Care | Payer: 59 | Attending: Emergency Medicine | Admitting: Emergency Medicine

## 2018-11-20 ENCOUNTER — Emergency Department (HOSPITAL_COMMUNITY): Payer: 59

## 2018-11-20 ENCOUNTER — Other Ambulatory Visit: Payer: Self-pay

## 2018-11-20 ENCOUNTER — Encounter (HOSPITAL_COMMUNITY): Payer: Self-pay | Admitting: Emergency Medicine

## 2018-11-20 DIAGNOSIS — Z79899 Other long term (current) drug therapy: Secondary | ICD-10-CM | POA: Diagnosis not present

## 2018-11-20 DIAGNOSIS — I10 Essential (primary) hypertension: Secondary | ICD-10-CM | POA: Diagnosis not present

## 2018-11-20 DIAGNOSIS — M79651 Pain in right thigh: Secondary | ICD-10-CM | POA: Insufficient documentation

## 2018-11-20 DIAGNOSIS — Z87891 Personal history of nicotine dependence: Secondary | ICD-10-CM | POA: Diagnosis not present

## 2018-11-20 DIAGNOSIS — M79604 Pain in right leg: Secondary | ICD-10-CM

## 2018-11-20 LAB — CBC WITH DIFFERENTIAL/PLATELET
Abs Immature Granulocytes: 0.07 10*3/uL (ref 0.00–0.07)
Basophils Absolute: 0.1 10*3/uL (ref 0.0–0.1)
Basophils Relative: 1 %
Eosinophils Absolute: 0.1 10*3/uL (ref 0.0–0.5)
Eosinophils Relative: 1 %
HCT: 46.7 % (ref 39.0–52.0)
Hemoglobin: 14.9 g/dL (ref 13.0–17.0)
Immature Granulocytes: 1 %
Lymphocytes Relative: 17 %
Lymphs Abs: 1.9 10*3/uL (ref 0.7–4.0)
MCH: 27.7 pg (ref 26.0–34.0)
MCHC: 31.9 g/dL (ref 30.0–36.0)
MCV: 87 fL (ref 80.0–100.0)
Monocytes Absolute: 0.7 10*3/uL (ref 0.1–1.0)
Monocytes Relative: 7 %
Neutro Abs: 8.3 10*3/uL — ABNORMAL HIGH (ref 1.7–7.7)
Neutrophils Relative %: 73 %
Platelets: 407 10*3/uL — ABNORMAL HIGH (ref 150–400)
RBC: 5.37 MIL/uL (ref 4.22–5.81)
RDW: 13.2 % (ref 11.5–15.5)
WBC: 11.1 10*3/uL — ABNORMAL HIGH (ref 4.0–10.5)
nRBC: 0 % (ref 0.0–0.2)

## 2018-11-20 LAB — BASIC METABOLIC PANEL
Anion gap: 7 (ref 5–15)
BUN: 15 mg/dL (ref 6–20)
CO2: 25 mmol/L (ref 22–32)
Calcium: 9.3 mg/dL (ref 8.9–10.3)
Chloride: 106 mmol/L (ref 98–111)
Creatinine, Ser: 0.68 mg/dL (ref 0.61–1.24)
GFR calc Af Amer: >60 mL/min (ref >60)
GFR calc non Af Amer: >60 mL/min (ref >60)
Glucose, Bld: 132 mg/dL — ABNORMAL HIGH (ref 70–99)
Potassium: 3.9 mmol/L (ref 3.5–5.1)
Sodium: 138 mmol/L (ref 135–145)

## 2018-11-20 MED ORDER — HYDROMORPHONE HCL 1 MG/ML IJ SOLN
1.0000 mg | Freq: Once | INTRAMUSCULAR | Status: AC
  Administered 2018-11-20: 1 mg via INTRAMUSCULAR
  Filled 2018-11-20: qty 1

## 2018-11-20 MED ORDER — MELOXICAM 15 MG PO TABS: 15.0000 mg | ORAL_TABLET | Freq: Every day | ORAL | 0 refills | Status: DC | PRN

## 2018-11-20 NOTE — ED Notes (Signed)
Pt to ultrasound

## 2018-11-20 NOTE — ED Triage Notes (Signed)
Pt states he thought he pulled a muscle in his R leg a week ago but that pain has gotten worse and travels from his thigh to his ankle.

## 2018-11-20 NOTE — ED Notes (Signed)
Have notified pt about delay of ultrasound. Verbalized understanding.

## 2018-11-20 NOTE — ED Provider Notes (Signed)
[facility] EMERGENCY DEPARTMENT Provider Note   CSN: [id number] Arrival date & time: 11/20/18  0554     History   Chief Complaint Chief Complaint  Patient presents with  . Leg Injury    HPI Erik Parker is a 53 y.o. male.     The history is provided by the patient.  Leg Pain Location:  Leg Leg location:  R upper leg Pain details:    Quality:  Aching   Radiates to:  R leg   Severity:  Severe   Onset quality:  Sudden   Timing:  Constant   Progression:  Worsening Chronicity:  New Relieved by:  Nothing Worsened by:  Bearing weight Associated symptoms: no back pain, no fatigue, no fever and no muscle weakness   Patient with history of hyperlipidemia hypertension presents with right thigh pain.  He reports he got off work last night without any issue.  He woke up in overnight with severe pain in his right anterior thigh that radiates this is lower leg.  No falls or trauma.  No fevers or vomiting.  No Chest pain/shortness of breath.  Reports it hurts to walk.  He has been using crutches to help with ambulation No h/o CAD/VTE Past Medical History:  Diagnosis Date  . Hyperlipidemia   . Hypertension     There are no active problems to display for this patient.   Past Surgical History:  Procedure Laterality Date  . ACHILLES TENDON SURGERY Right 11/10/2016   Procedure: ACHILLES TENDON REPAIR VIA SPEED BRIDGE;  Surgeon: [name], DPM;  Location: [facility];  Service: Podiatry;  Laterality: Right;  . HEEL SPUR RESECTION Right 11/10/2016   Procedure: HEEL SPUR RESECTION (HAGLUNDS DEFORMITY);  Surgeon: [name], DPM;  Location: [facility];  Service: Podiatry;  Laterality: Right;        Home Medications    Prior to Admission medications   Medication Sig Start Date End Date Taking? Authorizing Provider  amLODipine (NORVASC) 10 MG tablet Take 10 mg by mouth daily. 10/12/16   [provider]  hydrochlorothiazide (HYDRODIURIL) 25 MG tablet Take 25 mg by  mouth daily.    [provider]  lovastatin (MEVACOR) 20 MG tablet Take 20 mg by mouth at bedtime.    [provider]    Family History No family history on file.  Social History Social History   Tobacco Use  . Smoking status: Former Smoker    Packs/day: 0.50    Years: 9.00    Pack years: 4.50    Types: Cigarettes    Quit date: 10/08/2016    Years since quitting: 2.1  . Smokeless tobacco: Never Used  Substance Use Topics  . Alcohol use: Yes    Comment: occ  . Drug use: No     Allergies   Patient has no known allergies.   Review of Systems Review of Systems  Constitutional: Negative for fatigue and fever.  Respiratory: Negative for shortness of breath.   Cardiovascular: Negative for chest pain.  Musculoskeletal: Positive for arthralgias and myalgias. Negative for back pain.  Neurological: Negative for weakness.  All other systems reviewed and are negative.    Physical Exam Updated Vital Signs BP (!) 137/94   Pulse 79   Temp 98.2 F (36.8 C)   Resp 18   Ht 1.854 m (6\' 1" )   Wt 114.3 kg   SpO2 99%   BMI 33.25 kg/m   Physical Exam CONSTITUTIONAL: Well developed/well nourished HEAD: Normocephalic/atraumatic EYES: EOMI  ENMT: Mask in place NECK: supple no meningeal signs SPINE/BACK:entire spine nontender CV: S1/S2 noted, no murmurs/rubs/gallops noted LUNGS: Lungs are clear to auscultation bilaterally, no apparent distress ABDOMEN: soft, nontender, no rebound or guarding, bowel sounds noted throughout abdomen, no RLQ tenderness GU:no cva tenderness NEURO: Pt is awake/alert/appropriate, moves all extremitiesx4.  No facial droop.  He has appropriate strength with dorsiflexion and plantar flexion in right foot. He has limitation in range of motion of right hip and right knee due to significant pain in right thigh EXTREMITIES: pulses normal/equal, full ROM, equal femoral pulses noted without any thrill noted.  Distal pulses noted in right foot.   There is significant tenderness throughout the right anterior thigh, but no erythema or overlying skin changes.  No crepitus.  No signs of trauma, no deformities No joint effusions to the right lower extremity No lower extremity edema SKIN: warm, color normal PSYCH: no abnormalities of mood noted, alert and oriented to situation   ED Treatments / Results  Labs (all labs ordered are listed, but only abnormal results are displayed) Labs Reviewed  CBC WITH DIFFERENTIAL/PLATELET - Abnormal; Notable for the following components:      Result Value   WBC 11.1 (*)    Platelets 407 (*)    Neutro Abs 8.3 (*)    All other components within normal limits  BASIC METABOLIC PANEL    EKG None  Radiology No results found.  Procedures Procedures  Medications Ordered in ED Medications  HYDROmorphone (DILAUDID) injection 1 mg (has no administration in time range)     Initial Impression / Assessment and Plan / ED Course  I have reviewed the triage vital signs and the nursing notes.        6:43 AM Patient presents for atraumatic right thigh pain.  He has significant tenderness throughout the thigh.  He is neurovascularly intact Will proceed with DVT study 7:03 AM Signed out to dr Juleen Chinakohut at shift change to f/u on DVT study  Final Clinical Impressions(s) / ED Diagnoses   Final diagnoses:  None    ED Discharge Orders    None       Zadie RhineWickline, Ekaterina Denise, MD 11/20/18 [phone]

## 2019-02-26 ENCOUNTER — Telehealth: Payer: Self-pay | Admitting: Neurology

## 2019-02-26 ENCOUNTER — Other Ambulatory Visit: Payer: Self-pay

## 2019-02-26 ENCOUNTER — Encounter: Payer: Self-pay | Admitting: Neurology

## 2019-02-26 ENCOUNTER — Ambulatory Visit (INDEPENDENT_AMBULATORY_CARE_PROVIDER_SITE_OTHER): Payer: 59 | Admitting: Neurology

## 2019-02-26 VITALS — BP 130/73 | HR 72 | Temp 97.9°F | Ht 73.0 in | Wt 253.0 lb

## 2019-02-26 DIAGNOSIS — R200 Anesthesia of skin: Secondary | ICD-10-CM | POA: Insufficient documentation

## 2019-02-26 DIAGNOSIS — R29898 Other symptoms and signs involving the musculoskeletal system: Secondary | ICD-10-CM

## 2019-02-26 NOTE — Patient Instructions (Signed)
Carpal Tunnel Syndrome  Carpal tunnel syndrome is a condition that causes pain in your hand and arm. The carpal tunnel is a narrow area that is on the palm side of your wrist. Repeated wrist motion or certain diseases may cause swelling in the tunnel. This swelling can pinch the main nerve in the wrist (median nerve). What are the causes? This condition may be caused by:  Repeated wrist motions.  Wrist injuries.  Arthritis.  A sac of fluid (cyst) or abnormal growth (tumor) in the carpal tunnel.  Fluid buildup during pregnancy. Sometimes the cause is not known. What increases the risk? The following factors may make you more likely to develop this condition:  Having a job in which you move your wrist in the same way many times. This includes jobs like being a butcher or a cashier.  Being a woman.  Having other health conditions, such as: ? Diabetes. ? Obesity. ? A thyroid gland that is not active enough (hypothyroidism). ? Kidney failure. What are the signs or symptoms? Symptoms of this condition include:  A tingling feeling in your fingers.  Tingling or a loss of feeling (numbness) in your hand.  Pain in your entire arm. This pain may get worse when you bend your wrist and elbow for a long time.  Pain in your wrist that goes up your arm to your shoulder.  Pain that goes down into your palm or fingers.  A weak feeling in your hands. You may find it hard to grab and hold items. You may feel worse at night. How is this diagnosed? This condition is diagnosed with a medical history and physical exam. You may also have tests, such as:  Electromyogram (EMG). This test checks the signals that the nerves send to the muscles.  Nerve conduction study. This test checks how well signals pass through your nerves.  Imaging tests, such as X-rays, ultrasound, and MRI. These tests check for what might be the cause of your condition. How is this treated? This condition may be treated  with:  Lifestyle changes. You will be asked to stop or change the activity that caused your problem.  Doing exercise and activities that make bones and muscles stronger (physical therapy).  Learning how to use your hand again (occupational therapy).  Medicines for pain and swelling (inflammation). You may have injections in your wrist.  A wrist splint.  Surgery. Follow these instructions at home: If you have a splint:  Wear the splint as told by your doctor. Remove it only as told by your doctor.  Loosen the splint if your fingers: ? Tingle. ? Lose feeling (become numb). ? Turn cold and blue.  Keep the splint clean.  If the splint is not waterproof: ? Do not let it get wet. ? Cover it with a watertight covering when you take a bath or a shower. Managing pain, stiffness, and swelling   If told, put ice on the painful area: ? If you have a removable splint, remove it as told by your doctor. ? Put ice in a plastic bag. ? Place a towel between your skin and the bag. ? Leave the ice on for 20 minutes, 2-3 times per day. General instructions  Take over-the-counter and prescription medicines only as told by your doctor.  Rest your wrist from any activity that may cause pain. If needed, talk with your boss at work about changes that can help your wrist heal.  Do any exercises as told by your doctor,   physical therapist, or occupational therapist.  Keep all follow-up visits as told by your doctor. This is important. Contact a doctor if:  You have new symptoms.  Medicine does not help your pain.  Your symptoms get worse. Get help right away if:  You have very bad numbness or tingling in your wrist or hand. Summary  Carpal tunnel syndrome is a condition that causes pain in your hand and arm.  It is often caused by repeated wrist motions.  Lifestyle changes and medicines are used to treat this problem. Surgery may help in very bad cases.  Follow your doctor's  instructions about wearing a splint, resting your wrist, keeping follow-up visits, and calling for help. This information is not intended to replace advice given to you by your health care provider. Make sure you discuss any questions you have with your health care provider. Document Released: 02/04/2011 Document Revised: 06/24/2017 Document Reviewed: 06/24/2017 [employer] Patient Education  2020 [employer]  

## 2019-02-26 NOTE — Progress Notes (Signed)
Provider:  Melvyn Novasarmen  Conley Pawling, M D  Referring Provider: [facility] Care Physician:  [facility].   Chief Complaint  Patient presents with  . New Patient (Initial Visit)    pt alone, rm 10. pt states he has developed pain in hand, he has decreased in grip strength. this all started about a couple weeks ago. the 2 middle on bilateral hands feels tight  and numbness in bilateral hands.     HPI:  Erik Grillsony V Parker is a 53 y.o. male  Is seen here as a referral from Dr. Lenise HeraldBenjamin Mann at [facility].   Mr. Manson PasseyBrown presents with a rather acute onset of inability to completely make a fist and flex his fingers on both hands.  He has not noted any kind of muscle atrophy, fever, there was no rash.   The only new medication that he takes was given to him on 21 December and that is prednisone 5 mg as a Dosepak- total daily dose for over 3 or 4 days prior to his primary care visit on 15 December. There has been no cough, not fever before this started, just muscle ache and no joint pain. No headaches. Nobody in his family had Coronavirus infection- to his knowledge.   He has been on Tadenafil, sildenafil, lovastatin, hydrochlorothiazide, aspirin and amlodipine for many many months.  There have been no recent change in dose prescription medications.  He did feel however that his pian improved a little with steroids- not much- the pain is worse at night- and tolerable at daytime .    Family and social history. 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  Review of Systems: Out of a complete 14 system review, the patient complains of only the following symptoms, and all other  reviewed systems are negative.  muslcle pain in hands, swelling, inability to make a fist.   Social History   Socioeconomic History  . Marital status: Single    Spouse name: Not on file  . Number of children: Not on file  . Years of education: Not on file  . Highest education level: Not on file  Occupational History  . Not on file  Tobacco Use  . Smoking status: Former Smoker    Packs/day: 0.50    Years: 9.00    Pack years: 4.50    Types: Cigarettes    Quit date: 10/08/2016    Years since quitting: 2.3  . Smokeless tobacco: Never Used  Substance and Sexual Activity  . Alcohol use: Yes    Comment: occ  . Drug use: No  . Sexual activity: Not on file  Other Topics Concern  . Not on file  Social History Narrative  . Not on file   Social Determinants of Health   Financial Resource Strain:   . Difficulty of Paying Living Expenses: Not on file  Food Insecurity:   . Worried About Programme researcher, broadcasting/film/videounning Out of Food in the Last Year: Not on file  . Ran Out of Food in the Last Year: Not on file  Transportation Needs:   . Lack of Transportation (Medical): Not on file  . Lack of Transportation (Non-Medical): Not  on file  Physical Activity:   . Days of Exercise per Week: Not on file  . Minutes of Exercise per Session: Not on file  Stress:   . Feeling of Stress : Not on file  Social Connections:   . Frequency of Communication with Friends and Family: Not on file  . Frequency of Social Gatherings with Friends and Family: Not on file  . Attends Religious Services: Not on file  . Active Member of Clubs or Organizations: Not on file  . Attends Archivist Meetings: Not on file  . Marital Status: Not on file  Intimate Partner Violence:   . Fear of Current or Ex-Partner: Not on file  . Emotionally Abused: Not on file  . Physically Abused: Not on file  . Sexually Abused: Not on file    No family history on file.  Past Medical History:  Diagnosis Date  . Hyperlipidemia   .  Hypertension     Past Surgical History:  Procedure Laterality Date  . ACHILLES TENDON SURGERY Right 11/10/2016   Procedure: ACHILLES TENDON REPAIR VIA SPEED BRIDGE;  Surgeon: [name], DPM;  Location: [facility];  Service: Podiatry;  Laterality: Right;  . HEEL SPUR RESECTION Right 11/10/2016   Procedure: HEEL SPUR RESECTION (HAGLUNDS DEFORMITY);  Surgeon: [name], DPM;  Location: [facility];  Service: Podiatry;  Laterality: Right;    Current Outpatient Medications  Medication Sig Dispense Refill  . amLODipine (NORVASC) 10 MG tablet Take 10 mg by mouth daily.  0  . aspirin EC 81 MG tablet Take 81 mg by mouth daily.    . hydrochlorothiazide (HYDRODIURIL) 25 MG tablet Take 25 mg by mouth daily.    Marland Kitchen lovastatin (MEVACOR) 40 MG tablet Take 20 mg by mouth at bedtime.     . predniSONE (DELTASONE) 5 MG tablet TAKE 6 TABS X 2 DAYS, 5 TAB X 2 DAYS, 4 TAB X 2 DAYS, 3 TAB X 2 DAYS, 2 TAB X 2 DAYS, 1 TAB X 2 DAYS     No current facility-administered medications for this visit.    Allergies as of 02/26/2019  . (No Known Allergies)    Vitals: BP 130/73   Pulse 72   Temp 97.9 F (36.6 C)   Ht 6\' 1"  (1.854 m)   Wt 253 lb (114.8 kg)   BMI 33.38 kg/m  Last Weight:  Wt Readings from Last 1 Encounters:  02/26/19 253 lb (114.8 kg)   Last Height:   Ht Readings from Last 1 Encounters:  02/26/19 6\' 1"  (1.854 m)    Physical exam:  General: The patient is awake, alert and appears not in acute distress. The patient is well groomed. Head: Normocephalic, atraumatic. Neck is supple. Cardiovascular:  Regular rate and rhythm, without  murmurs or carotid bruit, and without distended neck veins. Respiratory: Lungs are clear to auscultation. Skin:  Without evidence of edema, or rash - fingers appear swollen.  Trunk: BMI is 33,  elevated and patient  has normal posture.  Neurologic exam : The patient is awake and alert, oriented to place and time.  Memory subjective  described as intact.  There is a normal attention span & concentration ability. Speech is fluent without dysarthria, dysphonia or aphasia. Mood and affect are appropriate.  Cranial nerves: No LOSS of smell or taste reported.  Pupils are equal and briskly reactive to light. Funduscopic exam deferred. Extraocular movements  in vertical and horizontal planes intact and without nystagmus.  Visual fields by finger perimetry  are intact. Hearing to finger rub intact.  Facial sensation intact to fine touch. Facial motor strength is symmetric and tongue and uvula move midline. Tongue protrusion into either cheek is normal. Shoulder shrug is normal.   Motor exam:   Normal tone ,muscle bulk and symmetric strength in all proximal extremities, and both feet. Restricted grip strength. .  Sensory:  Fine touch, pinprick and vibration were tested in all extremities. Both hands are numb to fine touch and feel hot to the touch, he reports pain when I closed his hand, passive ROM is not restricted but painful. Proprioception was normal.  Coordination: Rapid alternating movements in the fingers/hands were slightly slowed. Finger-to-nose maneuver normal without evidence of ataxia, dysmetria or tremor.  Gait and station: Patient walks without assistive device and is able unassisted to climb up to the exam table. Strength within normal limits. Stance is stable and normal. Tandem gait is unfragmented. Romberg testing is negative   Deep tendon reflexes: in the  upper and lower extremities are symmetric and intact. Babinski maneuver response is downgoing.   Assessment:  After physical and neurologic examination, review of laboratory studies, imaging, neurophysiology testing and pre-existing records, assessment is that of :   Bilateral hand swelling and numbness, painful restriction of movements. Myositis?  Has positive  C-tunnel symptoms, tinnel sign  but he can move freely at both wrists - concerning is the acute onset in all 5 fingers , both  hands.   Nightly pain shooting up to the area below elbow- this patient  is right side dominant.   He has taken a multivitamin and now is still on prednisone. He had started on 6 tabs of 5 mg , reduced to 5 tabs and then 4  tab of 5 mg, now on 3 of 5 mg in AM only.    Plan:  Treatment plan and additional workup :  Soft wrist braces at night, may be at work.  Continue and finish steroids.  EMG and NCV for both hands ASAP.  CK CKMB and sed rate < C reactive protein.  Thyroid function and CMET , ruling out endocrinological disorder.       Porfirio Mylar Reyanna Baley MD 02/26/2019

## 2019-02-26 NOTE — Telephone Encounter (Signed)
[facility] called and relayed CT CERVICAL SPINE W WO CONTRAST  Only can be done with out contrast . If any question's you can call Wells Guiles and [facility] CT Tech's [phone] .  Please re- order if Dr. Brett Fairy agree's . Thanks Hinton Dyer

## 2019-02-27 ENCOUNTER — Other Ambulatory Visit: Payer: Self-pay | Admitting: Neurology

## 2019-02-27 DIAGNOSIS — R29898 Other symptoms and signs involving the musculoskeletal system: Secondary | ICD-10-CM

## 2019-02-27 DIAGNOSIS — R200 Anesthesia of skin: Secondary | ICD-10-CM

## 2019-02-27 LAB — COMPREHENSIVE METABOLIC PANEL
ALT: 38 IU/L (ref 0–44)
AST: 16 IU/L (ref 0–40)
Albumin/Globulin Ratio: 1.6 (ref 1.2–2.2)
Albumin: 4.8 g/dL (ref 3.8–4.9)
Alkaline Phosphatase: 98 IU/L (ref 39–117)
BUN/Creatinine Ratio: 21 — ABNORMAL HIGH (ref 9–20)
BUN: 16 mg/dL (ref 6–24)
Bilirubin Total: 0.3 mg/dL (ref 0.0–1.2)
CO2: 23 mmol/L (ref 20–29)
Calcium: 10.2 mg/dL (ref 8.7–10.2)
Chloride: 99 mmol/L (ref 96–106)
Creatinine, Ser: 0.77 mg/dL (ref 0.76–1.27)
GFR calc Af Amer: 121 mL/min/{1.73_m2} (ref >59)
GFR calc non Af Amer: 105 mL/min/{1.73_m2} (ref >59)
Globulin, Total: 3 g/dL (ref 1.5–4.5)
Glucose: 112 mg/dL — ABNORMAL HIGH (ref 65–99)
Potassium: 4.5 mmol/L (ref 3.5–5.2)
Sodium: 140 mmol/L (ref 134–144)
Total Protein: 7.8 g/dL (ref 6.0–8.5)

## 2019-02-27 LAB — SEDIMENTATION RATE: Sed Rate: 45 mm/hr — ABNORMAL HIGH (ref 0–30)

## 2019-02-27 LAB — C-REACTIVE PROTEIN: CRP: 6 mg/L (ref 0–10)

## 2019-02-27 NOTE — Telephone Encounter (Signed)
I have corrected the order to reflect CT cervical neck without contrast.

## 2019-02-28 ENCOUNTER — Telehealth: Payer: Self-pay | Admitting: Neurology

## 2019-02-28 NOTE — Telephone Encounter (Signed)
-----   Message from Larey Seat, MD sent at 02/27/2019 12:03 PM EST ----- Elevated sed rate, normal kidney function.   Please share with PCP.

## 2019-02-28 NOTE — Telephone Encounter (Signed)
Called the patient to inform that lab work results were completed and read. Advised that kidney function looked good but the patient sed rate was elevated. Advised that Dr Brett Fairy wanted me to send the results to A PCP and he informed me to send the results to Dr Collene Mares at [facility]. Pt also has a apt coming up with rheumatology.

## 2019-03-07 ENCOUNTER — Ambulatory Visit
Admission: RE | Admit: 2019-03-07 | Discharge: 2019-03-07 | Disposition: A | Discharge status: Home / Self Care | Payer: 59 | Source: Ambulatory Visit | Attending: Neurology | Admitting: Neurology

## 2019-03-07 ENCOUNTER — Other Ambulatory Visit: Payer: Self-pay

## 2019-03-07 DIAGNOSIS — R29898 Other symptoms and signs involving the musculoskeletal system: Secondary | ICD-10-CM

## 2019-03-07 DIAGNOSIS — R200 Anesthesia of skin: Secondary | ICD-10-CM

## 2019-03-09 ENCOUNTER — Telehealth: Payer: Self-pay | Admitting: Neurology

## 2019-03-09 NOTE — Telephone Encounter (Signed)
Called the patient. There was no answer. LVM detailed informing the result of the CT findings and encouraged the patient to call back with any questions he may have.

## 2019-03-09 NOTE — Telephone Encounter (Signed)
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 Mild left foraminal narrowing C7-T1 due to facet hypertrophy and spurring. Expected findings in a 54 year old male.

## 2019-04-12 ENCOUNTER — Encounter (INDEPENDENT_AMBULATORY_CARE_PROVIDER_SITE_OTHER): Payer: 59 | Admitting: Diagnostic Neuroimaging

## 2019-04-12 ENCOUNTER — Ambulatory Visit (INDEPENDENT_AMBULATORY_CARE_PROVIDER_SITE_OTHER): Payer: 59 | Admitting: Diagnostic Neuroimaging

## 2019-04-12 ENCOUNTER — Other Ambulatory Visit: Payer: Self-pay

## 2019-04-12 DIAGNOSIS — R29898 Other symptoms and signs involving the musculoskeletal system: Secondary | ICD-10-CM

## 2019-04-12 DIAGNOSIS — Z0289 Encounter for other administrative examinations: Secondary | ICD-10-CM

## 2019-04-12 DIAGNOSIS — R200 Anesthesia of skin: Secondary | ICD-10-CM

## 2019-04-12 NOTE — Procedures (Signed)
[facility]  NCS (NERVE CONDUCTION STUDY) WITH EMG (ELECTROMYOGRAPHY) REPORT   STUDY DATE: 04/12/19 PATIENT NAME: Erik Parker DOB: 02/23/1966 MRN: [id number]  ORDERING CLINICIAN: C Dohmeier  TECHNOLOGIST: Sherre Scarlet ELECTROMYOGRAPHER: Earlean Polka. Aryannah Mohon, MD  CLINICAL INFORMATION: 54 year old male with bilateral hand numbness.  FINDINGS: NERVE CONDUCTION STUDY:  Bilateral median motor responses are prolonged distal latencies (right 11.1 ms, left 9.1 ms, normal less than equal to 4.4 ms). Right median motor response has decreased amplitude and slow conduction velocity.  Left median motor response has normal amplitude and conduction velocity.  Bilateral ulnar motor responses are normal.  Bilateral ulnar F-wave latencies are normal.  Bilateral median sensory sponsors could not be obtained.  Bilateral ulnar sensory responses are normal.   NEEDLE ELECTROMYOGRAPHY:  Needle examination of right upper extremity is normal.   IMPRESSION:   Abnormal study demonstrating: - Bilateral median neuropathies at the wrist consistent with bilateral carpal tunnel syndrome.   INTERPRETING PHYSICIAN:  Penni Bombard, MD Certified in Neurology, Neurophysiology and Neuroimaging  [facility] [address] [place], [place] [address] [phone]  [facility]    Nerve / Sites Muscle Latency Ref. Amplitude Ref. Rel Amp Segments Distance Velocity Ref. Area    ms ms mV mV %  cm m/s m/s mVms  L Median - APB     Wrist APB 9.1 ?4.4 4.8 ?4.0 100 Wrist - APB 7   16.9     Upper arm APB 14.0  4.6  94.1 Upper arm - Wrist 24 49 ?49 15.7  R Median - APB     Wrist APB 11.1 ?4.4 0.8 ?4.0 100 Wrist - APB 7   2.3     Upper arm APB 16.4  0.9  119 Upper arm - Wrist 24 45 ?49 2.9  L Ulnar - ADM     Wrist ADM 2.6 ?3.3 10.5 ?6.0 100 Wrist - ADM 7   33.3     B.Elbow ADM 6.9  9.7  91.6 B.Elbow - Wrist 24 57 ?49 32.8     A.Elbow ADM 8.7  9.1  94.7 A.Elbow - B.Elbow  10 55 ?49 32.1         A.Elbow - Wrist      R Ulnar - ADM     Wrist ADM 2.7 ?3.3 11.0 ?6.0 100 Wrist - ADM 7   31.6     B.Elbow ADM 6.7  10.6  96.4 B.Elbow - Wrist 24 60 ?49 30.5     A.Elbow ADM 8.3  10.1  95.1 A.Elbow - B.Elbow 10 60 ?49 30.1         A.Elbow - Wrist                 SNC    Nerve / Sites Rec. Site Peak Lat Ref.  Amp Ref. Segments Distance    ms ms V V  cm  L Median - Orthodromic (Dig II, Mid palm)     Dig II Wrist NR ?3.4 NR ?10 Dig II - Wrist 13  R Median - Orthodromic (Dig II, Mid palm)     Dig II Wrist NR ?3.4 NR ?10 Dig II - Wrist 13  L Ulnar - Orthodromic, (Dig V, Mid palm)     Dig V Wrist 2.8 ?3.1 9 ?5 Dig V - Wrist 11  R Ulnar - Orthodromic, (Dig V, Mid palm)     Dig V Wrist 2.7 ?3.1 7 ?5 Dig V - Wrist 11  F  Wave    Nerve F Lat Ref.   ms ms  L Ulnar - ADM 31.3 ?32.0  R Ulnar - ADM 30.7 ?32.0         EMG Summary Table    Spontaneous MUAP Recruitment  Muscle IA Fib PSW Fasc Other Amp Dur. Poly Pattern  R. Deltoid Normal None None None _______ Normal Normal Normal Normal  R. Biceps brachii Normal None None None _______ Normal Normal Normal Normal  R. Triceps brachii Normal None None None _______ Normal Normal Normal Normal  R. Flexor carpi radialis Normal None None None _______ Normal Normal Normal Normal  R. First dorsal interosseous Normal None None None _______ Normal Normal Normal Normal

## 2019-04-13 NOTE — Progress Notes (Signed)
Bilateral median neuropathies at the wrist consistent with  bilateral carpal tunnel syndrome.   I recommend wrist splints, no need for surgery or other intervention.

## 2019-04-16 ENCOUNTER — Telehealth: Payer: Self-pay | Admitting: Neurology

## 2019-04-16 NOTE — Telephone Encounter (Signed)
Called the patient to review the Nerve conduction study results. There was no answer. LVM asking for the patient to call back.   **If pt calls back please advise that the nerve conduction study did show that he had areas on both wrist areas that appear to be consistent with carpal tunnel syndrome. At this time she doesn't feel that surgery is needed but would recommend the patient purchases splints for both wrist to help with this. We will review in more detail at his follow up visit 2/25 with Amy.

## 2019-04-16 NOTE — Telephone Encounter (Signed)
-----   Message from Melvyn Novas, MD sent at 04/13/2019  9:33 AM EST ----- Bilateral median neuropathies at the wrist consistent with  bilateral carpal tunnel syndrome.   I recommend wrist splints, no need for surgery or other intervention.

## 2019-04-26 ENCOUNTER — Ambulatory Visit (INDEPENDENT_AMBULATORY_CARE_PROVIDER_SITE_OTHER): Payer: 59 | Admitting: Family Medicine

## 2019-04-26 ENCOUNTER — Encounter: Payer: Self-pay | Admitting: Family Medicine

## 2019-04-26 ENCOUNTER — Other Ambulatory Visit: Payer: Self-pay

## 2019-04-26 VITALS — BP 130/81 | HR 65 | Temp 97.4°F | Ht 72.0 in | Wt 262.2 lb

## 2019-04-26 DIAGNOSIS — G5603 Carpal tunnel syndrome, bilateral upper limbs: Secondary | ICD-10-CM

## 2019-04-26 NOTE — Patient Instructions (Signed)
Wear wrist brace as often as you can. I recommend referral to neurosurgery for consideration of carpal tunnel release  Follow up as needed   Carpal Tunnel Syndrome  Carpal tunnel syndrome is a condition that causes pain in your hand and arm. The carpal tunnel is a narrow area that is on the palm side of your wrist. Repeated wrist motion or certain diseases may cause swelling in the tunnel. This swelling can pinch the main nerve in the wrist (median nerve). What are the causes? This condition may be caused by:  Repeated wrist motions.  Wrist injuries.  Arthritis.  A sac of fluid (cyst) or abnormal growth (tumor) in the carpal tunnel.  Fluid buildup during pregnancy. Sometimes the cause is not known. What increases the risk? The following factors may make you more likely to develop this condition:  Having a job in which you move your wrist in the same way many times. This includes jobs like being a Software engineer or a Scientist, water quality.  Being a woman.  Having other health conditions, such as: ? Diabetes. ? Obesity. ? A thyroid gland that is not active enough (hypothyroidism). ? Kidney failure. What are the signs or symptoms? Symptoms of this condition include:  A tingling feeling in your fingers.  Tingling or a loss of feeling (numbness) in your hand.  Pain in your entire arm. This pain may get worse when you bend your wrist and elbow for a long time.  Pain in your wrist that goes up your arm to your shoulder.  Pain that goes down into your palm or fingers.  A weak feeling in your hands. You may find it hard to grab and hold items. You may feel worse at night. How is this diagnosed? This condition is diagnosed with a medical history and physical exam. You may also have tests, such as:  Electromyogram (EMG). This test checks the signals that the nerves send to the muscles.  Nerve conduction study. This test checks how well signals pass through your nerves.  Imaging tests, such as  X-rays, ultrasound, and MRI. These tests check for what might be the cause of your condition. How is this treated? This condition may be treated with:  Lifestyle changes. You will be asked to stop or change the activity that caused your problem.  Doing exercise and activities that make bones and muscles stronger (physical therapy).  Learning how to use your hand again (occupational therapy).  Medicines for pain and swelling (inflammation). You may have injections in your wrist.  A wrist splint.  Surgery. Follow these instructions at home: If you have a splint:  Wear the splint as told by your doctor. Remove it only as told by your doctor.  Loosen the splint if your fingers: ? Tingle. ? Lose feeling (become numb). ? Turn cold and blue.  Keep the splint clean.  If the splint is not waterproof: ? Do not let it get wet. ? Cover it with a watertight covering when you take a bath or a shower. Managing pain, stiffness, and swelling   If told, put ice on the painful area: ? If you have a removable splint, remove it as told by your doctor. ? Put ice in a plastic bag. ? Place a towel between your skin and the bag. ? Leave the ice on for 20 minutes, 2-3 times per day. General instructions  Take over-the-counter and prescription medicines only as told by your doctor.  Rest your wrist from any activity that may cause  pain. If needed, talk with your boss at work about changes that can help your wrist heal.  Do any exercises as told by your doctor, physical therapist, or occupational therapist.  Keep all follow-up visits as told by your doctor. This is important. Contact a doctor if:  You have new symptoms.  Medicine does not help your pain.  Your symptoms get worse. Get help right away if:  You have very bad numbness or tingling in your wrist or hand. Summary  Carpal tunnel syndrome is a condition that causes pain in your hand and arm.  It is often caused by repeated  wrist motions.  Lifestyle changes and medicines are used to treat this problem. Surgery may help in very bad cases.  Follow your doctor's instructions about wearing a splint, resting your wrist, keeping follow-up visits, and calling for help. This information is not intended to replace advice given to you by your health care provider. Make sure you discuss any questions you have with your health care provider. Document Revised: 06/24/2017 Document Reviewed: 06/24/2017 Elsevier Patient Education  2020 [employer].

## 2019-04-26 NOTE — Progress Notes (Signed)
PATIENT: Erik Parker DOB: [birth year]  REASON FOR VISIT: follow up HISTORY FROM: patient  Chief Complaint  Patient presents with  . Follow-up    Rm 7. alone. still having numbness in BL hands. States that it seems like he is holding something but cannot feel it.     HISTORY OF PRESENT ILLNESS: Today 04/26/19 Erik Parker is a 54 y.o. male here today for follow up of bilateral hand numbness. EMG/NCS showed bilateral carpal tunnel syndrome. MR cervical spine showed "Mild disc degeneration throughout the cervical spine. Mild left foraminal narrowing C7-T1 due to facet hypertrophy and spurring.". He reports that prednisone did not help at all. He is wearing wrist braces every day and when he sleeps that have not helped much at all. He does report taking a "pain medication" given by rheumatology that has helped with pain but numbness seems to be worsening. Right > left. He is having difficulty buttoning his shirts and tying his shoes. He feels that is grip is weakening.    HISTORY: (copied from Dr Dohmeier's note on 02/26/2019)  HPI:  Erik Parker is a 54 y.o. male  Is seen here as a referral from Dr. Lenise Herald at [facility].   Erik Parker presents with a rather acute onset of inability to completely make a fist and flex his fingers on both hands.  He has not noted any kind of muscle atrophy, fever, there was no rash.   The only new medication that he takes was given to him on 21 December and that is prednisone 5 mg as a Dosepak- total daily dose for over 3 or 4 days prior to his primary care visit on 15 December. There has been no cough, not fever before this started, just muscle ache and no joint pain. No headaches. Nobody in his family had Coronavirus infection- to his knowledge.   He has been on Tadenafil, sildenafil, lovastatin, hydrochlorothiazide, aspirin and amlodipine for many many months.  There have been no recent change in dose prescription medications.  He  did feel however that his pian improved a little with steroids- not much- the pain is worse at night- and tolerable at daytime .    Family and social history. The patient is married with 3 children lives with his spouse his youngest child is still living at home, pets are present in the house, he is a full-time full employed in a Associate Professor, Merchandiser, retail, the Writer.e is a current smoker, but he drinks very little alcohol, caffeine use restricted to 1 coffee in the morning no sodas nor tea.   REVIEW OF SYSTEMS: Out of a complete 14 system review of symptoms, the patient complains only of the following symptoms, bilateral hand numbness and all other reviewed systems are negative.  ALLERGIES: No Known Allergies  HOME MEDICATIONS: Outpatient Medications Prior to Visit  Medication Sig Dispense Refill  . amLODipine (NORVASC) 10 MG tablet Take 10 mg by mouth daily.  0  . aspirin EC 81 MG tablet Take 81 mg by mouth daily.    . folic acid (FOLVITE) 1 MG tablet Take 1 mg by mouth daily.    . hydrochlorothiazide (HYDRODIURIL) 25 MG tablet Take 25 mg by mouth daily.    Marland Kitchen lovastatin (MEVACOR) 40 MG tablet Take 20 mg by mouth at bedtime.     . Methotrexate Sodium (METHOTREXATE, PF,) 50 MG/2ML injection INJECT 0.6 ML ONCE A WEEK    . predniSONE (DELTASONE) 5 MG  tablet TAKE 6 TABS X 2 DAYS, 5 TAB X 2 DAYS, 4 TAB X 2 DAYS, 3 TAB X 2 DAYS, 2 TAB X 2 DAYS, 1 TAB X 2 DAYS     No facility-administered medications prior to visit.    PAST MEDICAL HISTORY: Past Medical History:  Diagnosis Date  . Hyperlipidemia   . Hypertension     PAST SURGICAL HISTORY: Past Surgical History:  Procedure Laterality Date  . ACHILLES TENDON SURGERY Right 11/10/2016   Procedure: ACHILLES TENDON REPAIR VIA SPEED BRIDGE;  Surgeon: [name], DPM;  Location: [facility];  Service: Podiatry;  Laterality: Right;  . HEEL SPUR RESECTION Right 11/10/2016   Procedure: HEEL SPUR RESECTION (HAGLUNDS  DEFORMITY);  Surgeon: [name], DPM;  Location: [facility];  Service: Podiatry;  Laterality: Right;    FAMILY HISTORY: No family history on file.  SOCIAL HISTORY: Social History   Socioeconomic History  . Marital status: Single    Spouse name: Not on file  . Number of children: Not on file  . Years of education: Not on file  . Highest education level: Not on file  Occupational History  . Not on file  Tobacco Use  . Smoking status: Former Smoker    Packs/day: 0.50    Years: 9.00    Pack years: 4.50    Types: Cigarettes    Quit date: 10/08/2016    Years since quitting: 2.5  . Smokeless tobacco: Never Used  Substance and Sexual Activity  . Alcohol use: Yes    Comment: occ  . Drug use: No  . Sexual activity: Not on file  Other Topics Concern  . Not on file  Social History Narrative  . Not on file   Social Determinants of Health   Financial Resource Strain:   . Difficulty of Paying Living Expenses: Not on file  Food Insecurity:   . Worried About Charity fundraiser in the Last Year: Not on file  . Ran Out of Food in the Last Year: Not on file  Transportation Needs:   . Lack of Transportation (Medical): Not on file  . Lack of Transportation (Non-Medical): Not on file  Physical Activity:   . Days of Exercise per Week: Not on file  . Minutes of Exercise per Session: Not on file  Stress:   . Feeling of Stress : Not on file  Social Connections:   . Frequency of Communication with Friends and Family: Not on file  . Frequency of Social Gatherings with Friends and Family: Not on file  . Attends Religious Services: Not on file  . Active Member of Clubs or Organizations: Not on file  . Attends Archivist Meetings: Not on file  . Marital Status: Not on file  Intimate Partner Violence:   . Fear of Current or Ex-Partner: Not on file  . Emotionally Abused: Not on file  . Physically Abused: Not on file  . Sexually Abused: Not on file      PHYSICAL  EXAM  Vitals:   04/26/19 0918  BP: 130/81  Pulse: 65  Temp: (!) 97.4 F (36.3 C)  Weight: 262 lb 3.2 oz (118.9 kg)  Height: 6' (1.829 m)   Body mass index is 35.56 kg/m.  Generalized: Well developed, in no acute distress  Cardiology: normal rate and rhythm, no murmur noted Respiratory: clear to auscultation bilaterally  Neurological examination  Mentation: Alert oriented to time, place, history taking. Follows all commands speech and language fluent Cranial nerve II-XII:  Pupils were equal round reactive to light. Extraocular movements were full, visual field were full  Motor: The motor testing reveals 5 over 5 strength of all 4 extremities. Good symmetric motor tone is noted throughout.  Sensory: Sensory testing is intact to soft touch on all 4 extremities with exception of decreased bilateral thumb, 1 and 2 digit. No obvious worsening with flexion of wrists, negative tinel today, No evidence of extinction is noted.  Gait and station: Gait is normal.   DIAGNOSTIC DATA (LABS, IMAGING, TESTING) - I reviewed patient records, labs, notes, testing and imaging myself where available.  No flowsheet data found.   Lab Results  Component Value Date   WBC 11.1 (H) 11/20/2018   HGB 14.9 11/20/2018   HCT 46.7 11/20/2018   MCV 87.0 11/20/2018   PLT 407 (H) 11/20/2018      Component Value Date/Time   NA 140 02/26/2019 1107   K 4.5 02/26/2019 1107   CL 99 02/26/2019 1107   CO2 23 02/26/2019 1107   GLUCOSE 112 (H) 02/26/2019 1107   GLUCOSE 132 (H) 11/20/2018 0648   BUN 16 02/26/2019 1107   CREATININE 0.77 02/26/2019 1107   CALCIUM 10.2 02/26/2019 1107   PROT 7.8 02/26/2019 1107   ALBUMIN 4.8 02/26/2019 1107   AST 16 02/26/2019 1107   ALT 38 02/26/2019 1107   ALKPHOS 98 02/26/2019 1107   BILITOT 0.3 02/26/2019 1107   GFRNONAA 105 02/26/2019 1107   GFRAA 121 02/26/2019 1107   No results found for: CHOL, HDL, LDLCALC, LDLDIRECT, TRIG, CHOLHDL No results found for: SEGB1D No  results found for: VITAMINB12 No results found for: TSH     ASSESSMENT AND PLAN 54 y.o. year old male  has a past medical history of Hyperlipidemia and Hypertension. here with     ICD-10-CM   1. Bilateral carpal tunnel syndrome  G56.03 Ambulatory referral to Neurosurgery    Mr. Hetland continues to experience bilateral upper extremity numbness.  Cervical spine MRI with mild degenerative changes.  Nerve conduction study/EMG consistent with bilateral carpal tunnel.  Mr. Vickerman reports that although pain has improved, numbness seems to be worsening.  He is also concerned as he feels like grip strength is decreasing.  He is having more difficulty buttoning his shirt or tying his shoes.  He works with his Firefighter as a Location manager.  We have discussed conservative treatment options with continuing to wear braces as much as possible.  We have also discussed considering gabapentin.  He does not feel that gabapentin would be beneficial at this point and request to have a surgical consultation.  That this is reasonable considering the progressive nature and interference with performing ADLs.  We will place an order today to neurosurgery for evaluation.  He was advised to continue using wrist braces as often as possible, specifically when he sleeps.  He will call with any worsening of symptoms.  He will follow-up with me as needed pending neurosurgical evaluation.  He verbalizes understanding and agreement with this plan.   Orders Placed This Encounter  Procedures  . Ambulatory referral to Neurosurgery    Referral Priority:   Routine    Referral Type:   Surgical    Referral Reason:   Specialty Services Required    Requested Specialty:   Neurosurgery    Number of Visits Requested:   1     No orders of the defined types were placed in this encounter.     I spent 15 minutes with the  patient. 50% of this time was spent counseling and educating patient on plan of care and medications.    Shawnie Dapper,  FNP-C 04/26/2019, 10:00 AM [facility] [address], [address] [place], [place] [address] [phone]

## 2019-04-30 ENCOUNTER — Emergency Department (HOSPITAL_COMMUNITY)
Admission: EM | Admit: 2019-04-30 | Discharge: 2019-04-30 | Disposition: A | Discharge status: Home / Self Care | Payer: 59 | Source: Home / Self Care | Attending: Emergency Medicine | Admitting: Emergency Medicine

## 2019-04-30 ENCOUNTER — Encounter (HOSPITAL_COMMUNITY): Payer: Self-pay | Admitting: Emergency Medicine

## 2019-04-30 ENCOUNTER — Emergency Department (HOSPITAL_COMMUNITY): Payer: 59

## 2019-04-30 ENCOUNTER — Other Ambulatory Visit: Payer: Self-pay

## 2019-04-30 DIAGNOSIS — R079 Chest pain, unspecified: Secondary | ICD-10-CM | POA: Diagnosis not present

## 2019-04-30 DIAGNOSIS — Z79899 Other long term (current) drug therapy: Secondary | ICD-10-CM | POA: Insufficient documentation

## 2019-04-30 DIAGNOSIS — R0789 Other chest pain: Secondary | ICD-10-CM

## 2019-04-30 DIAGNOSIS — R0602 Shortness of breath: Secondary | ICD-10-CM | POA: Insufficient documentation

## 2019-04-30 DIAGNOSIS — F17210 Nicotine dependence, cigarettes, uncomplicated: Secondary | ICD-10-CM | POA: Insufficient documentation

## 2019-04-30 DIAGNOSIS — I10 Essential (primary) hypertension: Secondary | ICD-10-CM | POA: Insufficient documentation

## 2019-04-30 DIAGNOSIS — Z7982 Long term (current) use of aspirin: Secondary | ICD-10-CM | POA: Insufficient documentation

## 2019-04-30 DIAGNOSIS — I7101 Dissection of thoracic aorta: Secondary | ICD-10-CM | POA: Diagnosis not present

## 2019-04-30 LAB — CBC WITH DIFFERENTIAL/PLATELET
Abs Immature Granulocytes: 0.6 10*3/uL — ABNORMAL HIGH (ref 0.00–0.07)
Basophils Absolute: 0.1 10*3/uL (ref 0.0–0.1)
Basophils Relative: 1 %
Eosinophils Absolute: 0 10*3/uL (ref 0.0–0.5)
Eosinophils Relative: 0 %
HCT: 45.4 % (ref 39.0–52.0)
Hemoglobin: 14.9 g/dL (ref 13.0–17.0)
Immature Granulocytes: 4 %
Lymphocytes Relative: 19 %
Lymphs Abs: 3 10*3/uL (ref 0.7–4.0)
MCH: 28.7 pg (ref 26.0–34.0)
MCHC: 32.8 g/dL (ref 30.0–36.0)
MCV: 87.5 fL (ref 80.0–100.0)
Monocytes Absolute: 0.8 10*3/uL (ref 0.1–1.0)
Monocytes Relative: 5 %
Neutro Abs: 11.1 10*3/uL — ABNORMAL HIGH (ref 1.7–7.7)
Neutrophils Relative %: 71 %
Platelets: 307 10*3/uL (ref 150–400)
RBC: 5.19 MIL/uL (ref 4.22–5.81)
RDW: 16.2 % — ABNORMAL HIGH (ref 11.5–15.5)
WBC: 15.6 10*3/uL — ABNORMAL HIGH (ref 4.0–10.5)
nRBC: 0 % (ref 0.0–0.2)

## 2019-04-30 LAB — COMPREHENSIVE METABOLIC PANEL
ALT: 33 U/L (ref 0–44)
AST: 20 U/L (ref 15–41)
Albumin: 4.3 g/dL (ref 3.5–5.0)
Alkaline Phosphatase: 52 U/L (ref 38–126)
Anion gap: 10 (ref 5–15)
BUN: 18 mg/dL (ref 6–20)
CO2: 22 mmol/L (ref 22–32)
Calcium: 9.4 mg/dL (ref 8.9–10.3)
Chloride: 106 mmol/L (ref 98–111)
Creatinine, Ser: 0.8 mg/dL (ref 0.61–1.24)
GFR calc Af Amer: >60 mL/min (ref >60)
GFR calc non Af Amer: >60 mL/min (ref >60)
Glucose, Bld: 123 mg/dL — ABNORMAL HIGH (ref 70–99)
Potassium: 3.7 mmol/L (ref 3.5–5.1)
Sodium: 138 mmol/L (ref 135–145)
Total Bilirubin: 0.6 mg/dL (ref 0.3–1.2)
Total Protein: 7.2 g/dL (ref 6.5–8.1)

## 2019-04-30 LAB — TROPONIN I (HIGH SENSITIVITY)
Troponin I (High Sensitivity): 7 ng/L (ref <18)
Troponin I (High Sensitivity): 8 ng/L (ref <18)

## 2019-04-30 LAB — PROTIME-INR
INR: 1 (ref 0.8–1.2)
Prothrombin Time: 12.6 seconds (ref 11.4–15.2)

## 2019-04-30 LAB — BRAIN NATRIURETIC PEPTIDE: B Natriuretic Peptide: 31 pg/mL (ref 0.0–100.0)

## 2019-04-30 LAB — LIPASE, BLOOD: Lipase: 47 U/L (ref 11–51)

## 2019-04-30 LAB — MAGNESIUM: Magnesium: 1.8 mg/dL (ref 1.7–2.4)

## 2019-04-30 LAB — CBG MONITORING, ED: Glucose-Capillary: 121 mg/dL — ABNORMAL HIGH (ref 70–99)

## 2019-04-30 MED ORDER — FAMOTIDINE IN NACL 20-0.9 MG/50ML-% IV SOLN
20.0000 mg | Freq: Once | INTRAVENOUS | Status: AC
  Administered 2019-04-30: 20 mg via INTRAVENOUS
  Filled 2019-04-30: qty 50

## 2019-04-30 MED ORDER — NITROGLYCERIN 0.4 MG SL SUBL
0.4000 mg | SUBLINGUAL_TABLET | SUBLINGUAL | Status: DC | PRN
  Administered 2019-04-30: 0.4 mg via SUBLINGUAL
  Filled 2019-04-30: qty 1

## 2019-04-30 MED ORDER — FENTANYL CITRATE (PF) 100 MCG/2ML IJ SOLN
50.0000 ug | Freq: Once | INTRAMUSCULAR | Status: AC
  Administered 2019-04-30: 50 ug via INTRAVENOUS
  Filled 2019-04-30: qty 2

## 2019-04-30 MED ORDER — ASPIRIN 81 MG PO CHEW
324.0000 mg | CHEWABLE_TABLET | Freq: Once | ORAL | Status: AC
  Administered 2019-04-30: 243 mg via ORAL
  Filled 2019-04-30: qty 4

## 2019-04-30 MED ORDER — FAMOTIDINE 20 MG PO TABS: 20.0000 mg | ORAL_TABLET | Freq: Two times a day (BID) | ORAL | 0 refills | Status: DC

## 2019-04-30 MED ORDER — SUCRALFATE 1 GM/10ML PO SUSP
1.0000 g | Freq: Once | ORAL | Status: AC
  Administered 2019-04-30: 1 g via ORAL
  Filled 2019-04-30: qty 10

## 2019-04-30 MED ORDER — SUCRALFATE 1 G PO TABS: 1.0000 g | ORAL_TABLET | Freq: Three times a day (TID) | ORAL | 0 refills | Status: DC

## 2019-04-30 NOTE — ED Provider Notes (Signed)
[facility] EMERGENCY DEPARTMENT Provider Note   CSN: [id number] Arrival date & time: 04/30/19  1232     History Chief Complaint  Patient presents with  . Chest Pain    Erik Parker is a 54 y.o. male.  HPI     Patient presents with chest pain.  Patient notes that he is generally well was so, at work, when he developed sternal discomfort.  Pain is there, nonradiating, not improved with OTC medication/Tums. Pain is possibly worse with walking, but also with palpation.  No associated dyspnea, no fever, no other complaints. Since onset, beyond Tums, no other medication taken for relief.  He denies a history of cardiac disease, prior evaluation with stress test, echocardiogram, catheterization.  Past Medical History:  Diagnosis Date  . Hyperlipidemia   . Hypertension     Patient Active Problem List   Diagnosis Date Noted  . Bilateral hand numbness 02/26/2019  . Decreased grip strength 02/26/2019    Past Surgical History:  Procedure Laterality Date  . ACHILLES TENDON SURGERY Right 11/10/2016   Procedure: ACHILLES TENDON REPAIR VIA SPEED BRIDGE;  Surgeon: [name], DPM;  Location: [facility];  Service: Podiatry;  Laterality: Right;  . HEEL SPUR RESECTION Right 11/10/2016   Procedure: HEEL SPUR RESECTION (HAGLUNDS DEFORMITY);  Surgeon: [name], DPM;  Location: [facility];  Service: Podiatry;  Laterality: Right;       History reviewed. No pertinent family history.  Social History   Tobacco Use  . Smoking status: Current Every Day Smoker    Packs/day: 0.50    Years: 9.00    Pack years: 4.50    Types: Cigarettes  . Smokeless tobacco: Never Used  Substance Use Topics  . Alcohol use: Yes    Comment: occ  . Drug use: No    Home Medications Prior to Admission medications   Medication Sig Start Date End Date Taking? Authorizing Provider  amLODipine (NORVASC) 10 MG tablet Take 10 mg by mouth daily. 10/12/16   [provider]  aspirin EC 81 MG  tablet Take 81 mg by mouth daily.    [provider]  folic acid (FOLVITE) 1 MG tablet Take 1 mg by mouth daily. 03/22/19   [provider]  hydrochlorothiazide (HYDRODIURIL) 25 MG tablet Take 25 mg by mouth daily.    [provider]  lovastatin (MEVACOR) 40 MG tablet Take 20 mg by mouth at bedtime.     [provider]  Methotrexate Sodium (METHOTREXATE, PF,) 50 MG/2ML injection INJECT 0.6 ML ONCE A WEEK 03/22/19   [provider]  predniSONE (DELTASONE) 5 MG tablet TAKE 6 TABS X 2 DAYS, 5 TAB X 2 DAYS, 4 TAB X 2 DAYS, 3 TAB X 2 DAYS, 2 TAB X 2 DAYS, 1 TAB X 2 DAYS 02/19/19   [provider]    Allergies    Patient has no known allergies.  Review of Systems   Review of Systems  Constitutional:       Per HPI, otherwise negative  HENT:       Per HPI, otherwise negative  Respiratory:       Per HPI, otherwise negative  Cardiovascular:       Per HPI, otherwise negative  Gastrointestinal: Negative for vomiting.  Endocrine:       Negative aside from HPI  Genitourinary:       Neg aside from HPI   Musculoskeletal:       Per HPI, otherwise negative  Skin: Negative.  Neurological: Negative for syncope.    Physical Exam Updated Vital Signs BP (!) 103/52   Pulse (!) 56   Temp (!) 97.5 F (36.4 C) (Oral)   Resp 18   Ht 6' (1.829 m)   Wt 115.7 kg   SpO2 95%   BMI 34.58 kg/m   Physical Exam Vitals and nursing note reviewed.  Constitutional:      General: He is not in acute distress.    Appearance: He is well-developed.  HENT:     Head: Normocephalic and atraumatic.  Eyes:     Conjunctiva/sclera: Conjunctivae normal.  Cardiovascular:     Rate and Rhythm: Normal rate and regular rhythm.  Pulmonary:     Effort: Pulmonary effort is normal. No respiratory distress.     Breath sounds: No stridor.     Comments: Ttp, sternum and epigastrum Abdominal:     General: There is no distension.  Skin:    General: Skin is warm and  dry.  Neurological:     Mental Status: He is alert and oriented to person, place, and time.     ED Results / Procedures / Treatments   Labs (all labs ordered are listed, but only abnormal results are displayed) Labs Reviewed  COMPREHENSIVE METABOLIC PANEL - Abnormal; Notable for the following components:      Result Value   Glucose, Bld 123 (*)    All other components within normal limits  CBC WITH DIFFERENTIAL/PLATELET - Abnormal; Notable for the following components:   WBC 15.6 (*)    RDW 16.2 (*)    Neutro Abs 11.1 (*)    Abs Immature Granulocytes 0.60 (*)    All other components within normal limits  CBG MONITORING, ED - Abnormal; Notable for the following components:   Glucose-Capillary 121 (*)    All other components within normal limits  MAGNESIUM  BRAIN NATRIURETIC PEPTIDE  PROTIME-INR  LIPASE, BLOOD  TROPONIN I (HIGH SENSITIVITY)  TROPONIN I (HIGH SENSITIVITY)    EKG EKG Interpretation  Date/Time:  Monday April 30 2019 12:41:15 EST Ventricular Rate:  58 PR Interval:    QRS Duration: 101 QT Interval:  430 QTC Calculation: 423 R Axis:   -11 Text Interpretation: Sinus rhythm LAE, consider biatrial enlargement Left ventricular hypertrophy Abnormal ECG Confirmed by Gerhard Munch [phone]) on 04/30/2019 12:43:59 PM   Radiology DG Chest Portable 1 View  Result Date: 04/30/2019 CLINICAL DATA:  Onset chest pain today. EXAM: PORTABLE CHEST 1 VIEW COMPARISON:  PA and lateral chest 05/07/2010. FINDINGS: Lungs clear. Heart size normal. No pneumothorax or pleural fluid. No acute or focal bony abnormality. IMPRESSION: Negative chest. Electronically Signed   By: Drusilla Kanner M.D.   On: 04/30/2019 13:03    Procedures Procedures (including critical care time)  Medications Ordered in ED Medications  nitroGLYCERIN (NITROSTAT) SL tablet 0.4 mg (0.4 mg Sublingual Given 04/30/19 1326)  aspirin chewable tablet 324 mg (243 mg Oral Given 04/30/19 1302)  fentaNYL (SUBLIMAZE)  injection 50 mcg (50 mcg Intravenous Given 04/30/19 1356)  sucralfate (CARAFATE) 1 GM/10ML suspension 1 g (1 g Oral Given 04/30/19 1435)  famotidine (PEPCID) IVPB 20 mg premix (20 mg Intravenous New Bag/Given 04/30/19 1436)    ED Course  I have reviewed the triage vital signs and the nursing notes.  Pertinent labs & imaging results that were available during my care of the patient were reviewed by me and considered in my medical decision making (see chart for details).  Update: Patient appears calm, is hemodynamically unremarkable  initial troponin unremarkable. 3:53 PM Patient sleeping, awakens easily.  He notes that his pain is improved. After initial evaluation patient received Pepcid, Carafate.  Now, 2 normal troponin, nonischemic EKG, no smoking history, and some improvement after GI directed therapy, patient is appropriate for discharge.  Discussed return precautions and follow-up instructions.  Only complains of chest pain, the burning sensation, improvement here reassuring, low suspicion for atypical ACS, no evidence of PE, pneumothorax, pneumonia or other infectious etiology either. Final Clinical Impression(s) / ED Diagnoses Final diagnoses:  Atypical chest pain    Rx / DC Orders ED Discharge Orders         Ordered    sucralfate (CARAFATE) 1 g tablet  3 times daily with meals & bedtime    Note to Pharmacy: Take for one week   04/30/19 1555    famotidine (PEPCID) 20 MG tablet  2 times daily     04/30/19 1555           Carmin Muskrat, MD 04/30/19 1556

## 2019-04-30 NOTE — Discharge Instructions (Signed)
As discussed, your evaluation today has been largely reassuring.  But, it is important that you monitor your condition carefully, and do not hesitate to return to the ED if you develop new, or concerning changes in your condition. ? ?Otherwise, please follow-up with your physician for appropriate ongoing care. ? ?

## 2019-04-30 NOTE — ED Triage Notes (Signed)
PT reports about 2 hours ago he was spraying the floor at work and began having pain in center of chest that felt like heartburn. Took some tums with no relief and decided to come be evaluated.

## 2019-05-01 ENCOUNTER — Encounter (HOSPITAL_COMMUNITY)
Admission: EM | Disposition: A | Discharge status: Rehabilitation Facility | Payer: Self-pay | Source: Home / Self Care | Attending: Cardiothoracic Surgery

## 2019-05-01 ENCOUNTER — Emergency Department (HOSPITAL_COMMUNITY): Payer: 59 | Admitting: Certified Registered Nurse Anesthetist

## 2019-05-01 ENCOUNTER — Emergency Department (HOSPITAL_COMMUNITY): Payer: 59

## 2019-05-01 ENCOUNTER — Encounter (HOSPITAL_COMMUNITY): Payer: Self-pay | Admitting: Cardiothoracic Surgery

## 2019-05-01 ENCOUNTER — Inpatient Hospital Stay (HOSPITAL_COMMUNITY): Payer: 59

## 2019-05-01 ENCOUNTER — Other Ambulatory Visit: Payer: Self-pay

## 2019-05-01 ENCOUNTER — Inpatient Hospital Stay (HOSPITAL_COMMUNITY)
Admission: EM | Admit: 2019-05-01 | Discharge: 2019-05-09 | DRG: 219 | Disposition: A | Discharge status: Rehabilitation Facility | Payer: 59 | Attending: Cardiothoracic Surgery | Admitting: Cardiothoracic Surgery

## 2019-05-01 DIAGNOSIS — R079 Chest pain, unspecified: Secondary | ICD-10-CM

## 2019-05-01 DIAGNOSIS — F17210 Nicotine dependence, cigarettes, uncomplicated: Secondary | ICD-10-CM | POA: Diagnosis present

## 2019-05-01 DIAGNOSIS — R0682 Tachypnea, not elsewhere classified: Secondary | ICD-10-CM

## 2019-05-01 DIAGNOSIS — I6340 Cerebral infarction due to embolism of unspecified cerebral artery: Secondary | ICD-10-CM | POA: Insufficient documentation

## 2019-05-01 DIAGNOSIS — R1030 Lower abdominal pain, unspecified: Secondary | ICD-10-CM | POA: Diagnosis not present

## 2019-05-01 DIAGNOSIS — I2510 Atherosclerotic heart disease of native coronary artery without angina pectoris: Secondary | ICD-10-CM | POA: Diagnosis present

## 2019-05-01 DIAGNOSIS — D72829 Elevated white blood cell count, unspecified: Secondary | ICD-10-CM | POA: Diagnosis not present

## 2019-05-01 DIAGNOSIS — Z79899 Other long term (current) drug therapy: Secondary | ICD-10-CM

## 2019-05-01 DIAGNOSIS — I69320 Aphasia following cerebral infarction: Secondary | ICD-10-CM | POA: Diagnosis not present

## 2019-05-01 DIAGNOSIS — R109 Unspecified abdominal pain: Secondary | ICD-10-CM

## 2019-05-01 DIAGNOSIS — I309 Acute pericarditis, unspecified: Secondary | ICD-10-CM | POA: Diagnosis not present

## 2019-05-01 DIAGNOSIS — Z720 Tobacco use: Secondary | ICD-10-CM | POA: Diagnosis not present

## 2019-05-01 DIAGNOSIS — I472 Ventricular tachycardia: Secondary | ICD-10-CM | POA: Diagnosis not present

## 2019-05-01 DIAGNOSIS — I7100 Dissection of unspecified site of aorta: Secondary | ICD-10-CM | POA: Diagnosis present

## 2019-05-01 DIAGNOSIS — Z95828 Presence of other vascular implants and grafts: Secondary | ICD-10-CM

## 2019-05-01 DIAGNOSIS — E669 Obesity, unspecified: Secondary | ICD-10-CM | POA: Diagnosis present

## 2019-05-01 DIAGNOSIS — Z7982 Long term (current) use of aspirin: Secondary | ICD-10-CM

## 2019-05-01 DIAGNOSIS — Z1389 Encounter for screening for other disorder: Secondary | ICD-10-CM

## 2019-05-01 DIAGNOSIS — R1310 Dysphagia, unspecified: Secondary | ICD-10-CM | POA: Diagnosis present

## 2019-05-01 DIAGNOSIS — K219 Gastro-esophageal reflux disease without esophagitis: Secondary | ICD-10-CM | POA: Diagnosis present

## 2019-05-01 DIAGNOSIS — R29810 Facial weakness: Secondary | ICD-10-CM | POA: Diagnosis present

## 2019-05-01 DIAGNOSIS — R1312 Dysphagia, oropharyngeal phase: Secondary | ICD-10-CM | POA: Diagnosis not present

## 2019-05-01 DIAGNOSIS — D62 Acute posthemorrhagic anemia: Secondary | ICD-10-CM | POA: Diagnosis not present

## 2019-05-01 DIAGNOSIS — I7101 Dissection of thoracic aorta: Secondary | ICD-10-CM

## 2019-05-01 DIAGNOSIS — E785 Hyperlipidemia, unspecified: Secondary | ICD-10-CM | POA: Diagnosis not present

## 2019-05-01 DIAGNOSIS — G8321 Monoplegia of upper limb affecting right dominant side: Secondary | ICD-10-CM | POA: Diagnosis present

## 2019-05-01 DIAGNOSIS — R4701 Aphasia: Secondary | ICD-10-CM | POA: Diagnosis present

## 2019-05-01 DIAGNOSIS — Z9889 Other specified postprocedural states: Secondary | ICD-10-CM | POA: Diagnosis not present

## 2019-05-01 DIAGNOSIS — Z20822 Contact with and (suspected) exposure to covid-19: Secondary | ICD-10-CM | POA: Diagnosis present

## 2019-05-01 DIAGNOSIS — I10 Essential (primary) hypertension: Secondary | ICD-10-CM | POA: Diagnosis not present

## 2019-05-01 DIAGNOSIS — R29707 NIHSS score 7: Secondary | ICD-10-CM | POA: Diagnosis present

## 2019-05-01 DIAGNOSIS — I63412 Cerebral infarction due to embolism of left middle cerebral artery: Secondary | ICD-10-CM | POA: Diagnosis not present

## 2019-05-01 DIAGNOSIS — I71019 Dissection of thoracic aorta, unspecified: Secondary | ICD-10-CM

## 2019-05-01 DIAGNOSIS — Z6834 Body mass index (BMI) 34.0-34.9, adult: Secondary | ICD-10-CM

## 2019-05-01 DIAGNOSIS — I69351 Hemiplegia and hemiparesis following cerebral infarction affecting right dominant side: Secondary | ICD-10-CM | POA: Diagnosis not present

## 2019-05-01 DIAGNOSIS — Z4659 Encounter for fitting and adjustment of other gastrointestinal appliance and device: Secondary | ICD-10-CM

## 2019-05-01 DIAGNOSIS — I639 Cerebral infarction, unspecified: Secondary | ICD-10-CM | POA: Diagnosis not present

## 2019-05-01 DIAGNOSIS — I319 Disease of pericardium, unspecified: Secondary | ICD-10-CM

## 2019-05-01 DIAGNOSIS — I308 Other forms of acute pericarditis: Secondary | ICD-10-CM | POA: Diagnosis not present

## 2019-05-01 HISTORY — PX: REPAIR OF ACUTE ASCENDING THORACIC AORTIC DISSECTION: SHX6323

## 2019-05-01 LAB — POCT I-STAT, CHEM 8
BUN: 18 mg/dL (ref 6–20)
BUN: 15 mg/dL (ref 6–20)
BUN: 16 mg/dL (ref 6–20)
BUN: 17 mg/dL (ref 6–20)
BUN: 19 mg/dL (ref 6–20)
BUN: 17 mg/dL (ref 6–20)
BUN: 18 mg/dL (ref 6–20)
BUN: 17 mg/dL (ref 6–20)
Calcium, Ion: 1.29 mmol/L (ref 1.15–1.40)
Calcium, Ion: 1.14 mmol/L — ABNORMAL LOW (ref 1.15–1.40)
Calcium, Ion: 1.24 mmol/L (ref 1.15–1.40)
Calcium, Ion: 1.18 mmol/L (ref 1.15–1.40)
Calcium, Ion: 1.2 mmol/L (ref 1.15–1.40)
Calcium, Ion: 1.12 mmol/L — ABNORMAL LOW (ref 1.15–1.40)
Calcium, Ion: 1.33 mmol/L (ref 1.15–1.40)
Calcium, Ion: 1.31 mmol/L (ref 1.15–1.40)
Chloride: 103 mmol/L (ref 98–111)
Chloride: 104 mmol/L (ref 98–111)
Chloride: 105 mmol/L (ref 98–111)
Chloride: 102 mmol/L (ref 98–111)
Chloride: 103 mmol/L (ref 98–111)
Chloride: 104 mmol/L (ref 98–111)
Chloride: 106 mmol/L (ref 98–111)
Chloride: 108 mmol/L (ref 98–111)
Creatinine, Ser: 0.7 mg/dL (ref 0.61–1.24)
Creatinine, Ser: 0.6 mg/dL — ABNORMAL LOW (ref 0.61–1.24)
Creatinine, Ser: 0.7 mg/dL (ref 0.61–1.24)
Creatinine, Ser: 0.7 mg/dL (ref 0.61–1.24)
Creatinine, Ser: 0.9 mg/dL (ref 0.61–1.24)
Creatinine, Ser: 0.7 mg/dL (ref 0.61–1.24)
Creatinine, Ser: 0.7 mg/dL (ref 0.61–1.24)
Creatinine, Ser: 0.7 mg/dL (ref 0.61–1.24)
Glucose, Bld: 119 mg/dL — ABNORMAL HIGH (ref 70–99)
Glucose, Bld: 111 mg/dL — ABNORMAL HIGH (ref 70–99)
Glucose, Bld: 114 mg/dL — ABNORMAL HIGH (ref 70–99)
Glucose, Bld: 122 mg/dL — ABNORMAL HIGH (ref 70–99)
Glucose, Bld: 162 mg/dL — ABNORMAL HIGH (ref 70–99)
Glucose, Bld: 137 mg/dL — ABNORMAL HIGH (ref 70–99)
Glucose, Bld: 153 mg/dL — ABNORMAL HIGH (ref 70–99)
Glucose, Bld: 140 mg/dL — ABNORMAL HIGH (ref 70–99)
HCT: 40 % (ref 39.0–52.0)
HCT: 33 % — ABNORMAL LOW (ref 39.0–52.0)
HCT: 36 % — ABNORMAL LOW (ref 39.0–52.0)
HCT: 31 % — ABNORMAL LOW (ref 39.0–52.0)
HCT: 33 % — ABNORMAL LOW (ref 39.0–52.0)
HCT: 32 % — ABNORMAL LOW (ref 39.0–52.0)
HCT: 33 % — ABNORMAL LOW (ref 39.0–52.0)
HCT: 36 % — ABNORMAL LOW (ref 39.0–52.0)
Hemoglobin: 13.6 g/dL (ref 13.0–17.0)
Hemoglobin: 11.2 g/dL — ABNORMAL LOW (ref 13.0–17.0)
Hemoglobin: 12.2 g/dL — ABNORMAL LOW (ref 13.0–17.0)
Hemoglobin: 10.5 g/dL — ABNORMAL LOW (ref 13.0–17.0)
Hemoglobin: 11.2 g/dL — ABNORMAL LOW (ref 13.0–17.0)
Hemoglobin: 10.9 g/dL — ABNORMAL LOW (ref 13.0–17.0)
Hemoglobin: 11.2 g/dL — ABNORMAL LOW (ref 13.0–17.0)
Hemoglobin: 12.2 g/dL — ABNORMAL LOW (ref 13.0–17.0)
Potassium: 4.1 mmol/L (ref 3.5–5.1)
Potassium: 4.1 mmol/L (ref 3.5–5.1)
Potassium: 4.2 mmol/L (ref 3.5–5.1)
Potassium: 3.8 mmol/L (ref 3.5–5.1)
Potassium: 4.1 mmol/L (ref 3.5–5.1)
Potassium: 4.1 mmol/L (ref 3.5–5.1)
Potassium: 4.4 mmol/L (ref 3.5–5.1)
Potassium: 4.5 mmol/L (ref 3.5–5.1)
Sodium: 139 mmol/L (ref 135–145)
Sodium: 138 mmol/L (ref 135–145)
Sodium: 139 mmol/L (ref 135–145)
Sodium: 135 mmol/L (ref 135–145)
Sodium: 136 mmol/L (ref 135–145)
Sodium: 137 mmol/L (ref 135–145)
Sodium: 139 mmol/L (ref 135–145)
Sodium: 137 mmol/L (ref 135–145)
TCO2: 27 mmol/L (ref 22–32)
TCO2: 29 mmol/L (ref 22–32)
TCO2: 29 mmol/L (ref 22–32)
TCO2: 27 mmol/L (ref 22–32)
TCO2: 31 mmol/L (ref 22–32)
TCO2: 27 mmol/L (ref 22–32)
TCO2: 27 mmol/L (ref 22–32)
TCO2: 24 mmol/L (ref 22–32)

## 2019-05-01 LAB — CBC
HCT: 42.7 % (ref 39.0–52.0)
HCT: 37.4 % — ABNORMAL LOW (ref 39.0–52.0)
HCT: 35.9 % — ABNORMAL LOW (ref 39.0–52.0)
Hemoglobin: 13.9 g/dL (ref 13.0–17.0)
Hemoglobin: 12.1 g/dL — ABNORMAL LOW (ref 13.0–17.0)
Hemoglobin: 11.7 g/dL — ABNORMAL LOW (ref 13.0–17.0)
MCH: 28.5 pg (ref 26.0–34.0)
MCH: 28.3 pg (ref 26.0–34.0)
MCH: 28.5 pg (ref 26.0–34.0)
MCHC: 32.6 g/dL (ref 30.0–36.0)
MCHC: 32.4 g/dL (ref 30.0–36.0)
MCHC: 32.6 g/dL (ref 30.0–36.0)
MCV: 87.5 fL (ref 80.0–100.0)
MCV: 87.4 fL (ref 80.0–100.0)
MCV: 87.6 fL (ref 80.0–100.0)
Platelets: 281 10*3/uL (ref 150–400)
Platelets: 166 10*3/uL (ref 150–400)
Platelets: 211 10*3/uL (ref 150–400)
RBC: 4.88 MIL/uL (ref 4.22–5.81)
RBC: 4.28 MIL/uL (ref 4.22–5.81)
RBC: 4.1 MIL/uL — ABNORMAL LOW (ref 4.22–5.81)
RDW: 16.6 % — ABNORMAL HIGH (ref 11.5–15.5)
RDW: 16.3 % — ABNORMAL HIGH (ref 11.5–15.5)
RDW: 16.4 % — ABNORMAL HIGH (ref 11.5–15.5)
WBC: 16.2 10*3/uL — ABNORMAL HIGH (ref 4.0–10.5)
WBC: 17.7 10*3/uL — ABNORMAL HIGH (ref 4.0–10.5)
WBC: 18.3 10*3/uL — ABNORMAL HIGH (ref 4.0–10.5)
nRBC: 0 % (ref 0.0–0.2)
nRBC: 0 % (ref 0.0–0.2)
nRBC: 0 % (ref 0.0–0.2)

## 2019-05-01 LAB — POCT I-STAT 7, (LYTES, BLD GAS, ICA,H+H)
Acid-base deficit: 3 mmol/L — ABNORMAL HIGH (ref 0.0–2.0)
Acid-base deficit: 1 mmol/L (ref 0.0–2.0)
Acid-base deficit: 4 mmol/L — ABNORMAL HIGH (ref 0.0–2.0)
Bicarbonate: 25.1 mmol/L (ref 20.0–28.0)
Bicarbonate: 25.2 mmol/L (ref 20.0–28.0)
Bicarbonate: 24.9 mmol/L (ref 20.0–28.0)
Bicarbonate: 22.1 mmol/L (ref 20.0–28.0)
Calcium, Ion: 1.14 mmol/L — ABNORMAL LOW (ref 1.15–1.40)
Calcium, Ion: 1.19 mmol/L (ref 1.15–1.40)
Calcium, Ion: 1.37 mmol/L (ref 1.15–1.40)
Calcium, Ion: 1.27 mmol/L (ref 1.15–1.40)
HCT: 35 % — ABNORMAL LOW (ref 39.0–52.0)
HCT: 34 % — ABNORMAL LOW (ref 39.0–52.0)
HCT: 33 % — ABNORMAL LOW (ref 39.0–52.0)
HCT: 42 % (ref 39.0–52.0)
Hemoglobin: 11.9 g/dL — ABNORMAL LOW (ref 13.0–17.0)
Hemoglobin: 11.6 g/dL — ABNORMAL LOW (ref 13.0–17.0)
Hemoglobin: 11.2 g/dL — ABNORMAL LOW (ref 13.0–17.0)
Hemoglobin: 14.3 g/dL (ref 13.0–17.0)
O2 Saturation: 100 %
O2 Saturation: 100 %
O2 Saturation: 100 %
O2 Saturation: 95 %
Patient temperature: 35.3
Potassium: 4.1 mmol/L (ref 3.5–5.1)
Potassium: 4 mmol/L (ref 3.5–5.1)
Potassium: 4.1 mmol/L (ref 3.5–5.1)
Potassium: 4.5 mmol/L (ref 3.5–5.1)
Sodium: 139 mmol/L (ref 135–145)
Sodium: 136 mmol/L (ref 135–145)
Sodium: 139 mmol/L (ref 135–145)
Sodium: 140 mmol/L (ref 135–145)
TCO2: 26 mmol/L (ref 22–32)
TCO2: 27 mmol/L (ref 22–32)
TCO2: 26 mmol/L (ref 22–32)
TCO2: 23 mmol/L (ref 22–32)
pCO2 arterial: 41.5 mmHg (ref 32.0–48.0)
pCO2 arterial: 59.7 mmHg — ABNORMAL HIGH (ref 32.0–48.0)
pCO2 arterial: 48.5 mmHg — ABNORMAL HIGH (ref 32.0–48.0)
pCO2 arterial: 38.3 mmHg (ref 32.0–48.0)
pH, Arterial: 7.39 (ref 7.350–7.450)
pH, Arterial: 7.233 — ABNORMAL LOW (ref 7.350–7.450)
pH, Arterial: 7.319 — ABNORMAL LOW (ref 7.350–7.450)
pH, Arterial: 7.362 (ref 7.350–7.450)
pO2, Arterial: 253 mmHg — ABNORMAL HIGH (ref 83.0–108.0)
pO2, Arterial: 430 mmHg — ABNORMAL HIGH (ref 83.0–108.0)
pO2, Arterial: 244 mmHg — ABNORMAL HIGH (ref 83.0–108.0)
pO2, Arterial: 72 mmHg — ABNORMAL LOW (ref 83.0–108.0)

## 2019-05-01 LAB — I-STAT CHEM 8, ED
BUN: 19 mg/dL (ref 6–20)
Calcium, Ion: 1.24 mmol/L (ref 1.15–1.40)
Chloride: 106 mmol/L (ref 98–111)
Creatinine, Ser: 0.8 mg/dL (ref 0.61–1.24)
Glucose, Bld: 132 mg/dL — ABNORMAL HIGH (ref 70–99)
HCT: 43 % (ref 39.0–52.0)
Hemoglobin: 14.6 g/dL (ref 13.0–17.0)
Potassium: 3.9 mmol/L (ref 3.5–5.1)
Sodium: 139 mmol/L (ref 135–145)
TCO2: 24 mmol/L (ref 22–32)

## 2019-05-01 LAB — HEMOGLOBIN AND HEMATOCRIT, BLOOD
HCT: 32 % — ABNORMAL LOW (ref 39.0–52.0)
Hemoglobin: 10.5 g/dL — ABNORMAL LOW (ref 13.0–17.0)

## 2019-05-01 LAB — PROTIME-INR
INR: 1 (ref 0.8–1.2)
INR: 1.3 — ABNORMAL HIGH (ref 0.8–1.2)
Prothrombin Time: 13.1 seconds (ref 11.4–15.2)
Prothrombin Time: 15.6 seconds — ABNORMAL HIGH (ref 11.4–15.2)

## 2019-05-01 LAB — PREPARE RBC (CROSSMATCH)

## 2019-05-01 LAB — COMPREHENSIVE METABOLIC PANEL
ALT: 27 U/L (ref 0–44)
AST: 15 U/L (ref 15–41)
Albumin: 3.9 g/dL (ref 3.5–5.0)
Alkaline Phosphatase: 47 U/L (ref 38–126)
Anion gap: 9 (ref 5–15)
BUN: 20 mg/dL (ref 6–20)
CO2: 24 mmol/L (ref 22–32)
Calcium: 9.4 mg/dL (ref 8.9–10.3)
Chloride: 105 mmol/L (ref 98–111)
Creatinine, Ser: 0.82 mg/dL (ref 0.61–1.24)
GFR calc Af Amer: >60 mL/min (ref >60)
GFR calc non Af Amer: >60 mL/min (ref >60)
Glucose, Bld: 137 mg/dL — ABNORMAL HIGH (ref 70–99)
Potassium: 3.9 mmol/L (ref 3.5–5.1)
Sodium: 138 mmol/L (ref 135–145)
Total Bilirubin: 0.9 mg/dL (ref 0.3–1.2)
Total Protein: 6.9 g/dL (ref 6.5–8.1)

## 2019-05-01 LAB — DIFFERENTIAL
Abs Immature Granulocytes: 0.47 10*3/uL — ABNORMAL HIGH (ref 0.00–0.07)
Basophils Absolute: 0.1 10*3/uL (ref 0.0–0.1)
Basophils Relative: 1 %
Eosinophils Absolute: 0 10*3/uL (ref 0.0–0.5)
Eosinophils Relative: 0 %
Immature Granulocytes: 3 %
Lymphocytes Relative: 24 %
Lymphs Abs: 3.8 10*3/uL (ref 0.7–4.0)
Monocytes Absolute: 1.5 10*3/uL — ABNORMAL HIGH (ref 0.1–1.0)
Monocytes Relative: 9 %
Neutro Abs: 10.4 10*3/uL — ABNORMAL HIGH (ref 1.7–7.7)
Neutrophils Relative %: 63 %

## 2019-05-01 LAB — BASIC METABOLIC PANEL
Anion gap: 8 (ref 5–15)
BUN: 17 mg/dL (ref 6–20)
CO2: 20 mmol/L — ABNORMAL LOW (ref 22–32)
Calcium: 8.5 mg/dL — ABNORMAL LOW (ref 8.9–10.3)
Chloride: 111 mmol/L (ref 98–111)
Creatinine, Ser: 0.76 mg/dL (ref 0.61–1.24)
GFR calc Af Amer: >60 mL/min (ref >60)
GFR calc non Af Amer: >60 mL/min (ref >60)
Glucose, Bld: 156 mg/dL — ABNORMAL HIGH (ref 70–99)
Potassium: 4.5 mmol/L (ref 3.5–5.1)
Sodium: 139 mmol/L (ref 135–145)

## 2019-05-01 LAB — GLUCOSE, CAPILLARY
Glucose-Capillary: 122 mg/dL — ABNORMAL HIGH (ref 70–99)
Glucose-Capillary: 133 mg/dL — ABNORMAL HIGH (ref 70–99)
Glucose-Capillary: 121 mg/dL — ABNORMAL HIGH (ref 70–99)

## 2019-05-01 LAB — CBG MONITORING, ED: Glucose-Capillary: 145 mg/dL — ABNORMAL HIGH (ref 70–99)

## 2019-05-01 LAB — PLATELET COUNT: Platelets: 169 10*3/uL (ref 150–400)

## 2019-05-01 LAB — TRIGLYCERIDES: Triglycerides: 101 mg/dL (ref <150)

## 2019-05-01 LAB — APTT
aPTT: 27 seconds (ref 24–36)
aPTT: 30 seconds (ref 24–36)

## 2019-05-01 LAB — FIBRINOGEN: Fibrinogen: 269 mg/dL (ref 210–475)

## 2019-05-01 LAB — RESPIRATORY PANEL BY RT PCR (FLU A&B, COVID)
Influenza A by PCR: NEGATIVE
Influenza B by PCR: NEGATIVE
SARS Coronavirus 2 by RT PCR: NEGATIVE

## 2019-05-01 LAB — MAGNESIUM: Magnesium: 2.6 mg/dL — ABNORMAL HIGH (ref 1.7–2.4)

## 2019-05-01 LAB — ETHANOL: Alcohol, Ethyl (B): <10 mg/dL (ref <10)

## 2019-05-01 LAB — ABO/RH: ABO/RH(D): AB POS

## 2019-05-01 SURGERY — REPAIR, AORTIC DISSECTION, ASCENDING
Anesthesia: General | Site: Chest

## 2019-05-01 MED ORDER — PROTAMINE SULFATE 10 MG/ML IV SOLN
INTRAVENOUS | Status: AC
  Filled 2019-05-01: qty 5

## 2019-05-01 MED ORDER — PLASMA-LYTE 148 IV SOLN
INTRAVENOUS | Status: DC
  Filled 2019-05-01 (×2): qty 2.5

## 2019-05-01 MED ORDER — FAMOTIDINE 20 MG PO TABS: 20.0000 mg | ORAL_TABLET | Freq: Two times a day (BID) | ORAL | Status: DC

## 2019-05-01 MED ORDER — SODIUM CHLORIDE 0.9% FLUSH: 3.0000 mL | INTRAVENOUS | Status: DC | PRN

## 2019-05-01 MED ORDER — ONDANSETRON HCL 4 MG/2ML IJ SOLN
4.0000 mg | Freq: Four times a day (QID) | INTRAMUSCULAR | Status: DC | PRN
  Administered 2019-05-06: 4 mg via INTRAVENOUS
  Filled 2019-05-01: qty 2

## 2019-05-01 MED ORDER — LIDOCAINE 2% (20 MG/ML) 5 ML SYRINGE
INTRAMUSCULAR | Status: AC
  Filled 2019-05-01: qty 5

## 2019-05-01 MED ORDER — METOPROLOL TARTRATE 12.5 MG HALF TABLET: 12.5000 mg | ORAL_TABLET | Freq: Two times a day (BID) | ORAL | Status: DC

## 2019-05-01 MED ORDER — MIDAZOLAM HCL 5 MG/5ML IJ SOLN
INTRAMUSCULAR | Status: DC | PRN
  Administered 2019-05-01 (×4): 2 mg via INTRAVENOUS

## 2019-05-01 MED ORDER — SODIUM CHLORIDE 0.9 % IV SOLN
1.5000 g | INTRAVENOUS | Status: AC
  Administered 2019-05-01: 1.5 g via INTRAVENOUS
  Filled 2019-05-01 (×5): qty 1.5

## 2019-05-01 MED ORDER — ETOMIDATE 2 MG/ML IV SOLN
INTRAVENOUS | Status: DC | PRN
  Administered 2019-05-01: 20 mg via INTRAVENOUS

## 2019-05-01 MED ORDER — METOPROLOL TARTRATE 5 MG/5ML IV SOLN: 2.5000 mg | INTRAVENOUS | Status: DC | PRN

## 2019-05-01 MED ORDER — VANCOMYCIN HCL 1000 MG IV SOLR
INTRAVENOUS | Status: DC | PRN
  Administered 2019-05-01: 1000 mg
  Administered 2019-05-01: 2000 mg

## 2019-05-01 MED ORDER — PLASMA-LYTE 148 IV SOLN
INTRAVENOUS | Status: DC | PRN
  Administered 2019-05-01: 500 mL via INTRAVASCULAR

## 2019-05-01 MED ORDER — HEMOSTATIC AGENTS (NO CHARGE) OPTIME
TOPICAL | Status: DC | PRN
  Administered 2019-05-01 (×4): 1 via TOPICAL

## 2019-05-01 MED ORDER — BISACODYL 10 MG RE SUPP
10.0000 mg | Freq: Every day | RECTAL | Status: DC
  Filled 2019-05-01: qty 1

## 2019-05-01 MED ORDER — TRANEXAMIC ACID 1000 MG/10ML IV SOLN
1.5000 mg/kg/h | INTRAVENOUS | Status: AC
  Administered 2019-05-01 (×2): 1.5 mg/kg/h via INTRAVENOUS
  Filled 2019-05-01 (×2): qty 25

## 2019-05-01 MED ORDER — ALBUMIN HUMAN 5 % IV SOLN: INTRAVENOUS | Status: DC | PRN

## 2019-05-01 MED ORDER — MIDAZOLAM HCL (PF) 10 MG/2ML IJ SOLN
INTRAMUSCULAR | Status: AC
  Filled 2019-05-01: qty 2

## 2019-05-01 MED ORDER — 0.9 % SODIUM CHLORIDE (POUR BTL) OPTIME
TOPICAL | Status: DC | PRN
  Administered 2019-05-01: 6000 mL

## 2019-05-01 MED ORDER — MORPHINE SULFATE (PF) 2 MG/ML IV SOLN
1.0000 mg | INTRAVENOUS | Status: DC | PRN
  Administered 2019-05-01 – 2019-05-05 (×19): 2 mg via INTRAVENOUS
  Filled 2019-05-01: qty 2
  Filled 2019-05-01 (×18): qty 1

## 2019-05-01 MED ORDER — FOLIC ACID 1 MG PO TABS
1.0000 mg | ORAL_TABLET | Freq: Every day | ORAL | Status: DC
  Administered 2019-05-04 – 2019-05-09 (×6): 1 mg via ORAL
  Filled 2019-05-01 (×6): qty 1

## 2019-05-01 MED ORDER — NOREPINEPHRINE 4 MG/250ML-% IV SOLN
0.0000 ug/min | INTRAVENOUS | Status: DC
  Filled 2019-05-01: qty 250

## 2019-05-01 MED ORDER — LACTATED RINGERS IV SOLN: INTRAVENOUS | Status: DC

## 2019-05-01 MED ORDER — SODIUM CHLORIDE 0.9 % IV SOLN
INTRAVENOUS | Status: DC
  Filled 2019-05-01 (×2): qty 30

## 2019-05-01 MED ORDER — ALBUMIN HUMAN 5 % IV SOLN
250.0000 mL | INTRAVENOUS | Status: AC | PRN
  Administered 2019-05-01 (×4): 12.5 g via INTRAVENOUS
  Filled 2019-05-01: qty 500

## 2019-05-01 MED ORDER — TRANEXAMIC ACID 1000 MG/10ML IV SOLN
1.5000 mg/kg/h | INTRAVENOUS | Status: DC
  Filled 2019-05-01: qty 25

## 2019-05-01 MED ORDER — POTASSIUM CHLORIDE 2 MEQ/ML IV SOLN
80.0000 meq | INTRAVENOUS | Status: DC
  Filled 2019-05-01 (×2): qty 40

## 2019-05-01 MED ORDER — MILRINONE LACTATE IN DEXTROSE 20-5 MG/100ML-% IV SOLN
0.3000 ug/kg/min | INTRAVENOUS | Status: DC
  Filled 2019-05-01 (×2): qty 100

## 2019-05-01 MED ORDER — SODIUM CHLORIDE 0.9 % IV SOLN: 250.0000 mL | INTRAVENOUS | Status: DC

## 2019-05-01 MED ORDER — LACTATED RINGERS IV SOLN: INTRAVENOUS | Status: DC | PRN

## 2019-05-01 MED ORDER — PROPOFOL 1000 MG/100ML IV EMUL
INTRAVENOUS | Status: AC
  Administered 2019-05-01: 20 ug/kg/min via INTRAVENOUS
  Filled 2019-05-01: qty 100

## 2019-05-01 MED ORDER — CHLORHEXIDINE GLUCONATE CLOTH 2 % EX PADS
6.0000 | MEDICATED_PAD | Freq: Every day | CUTANEOUS | Status: DC
  Administered 2019-05-01 – 2019-05-08 (×7): 6 via TOPICAL

## 2019-05-01 MED ORDER — LACTATED RINGERS IV SOLN
500.0000 mL | Freq: Once | INTRAVENOUS | Status: AC | PRN
  Administered 2019-05-02: 500 mL via INTRAVENOUS

## 2019-05-01 MED ORDER — FENTANYL CITRATE (PF) 250 MCG/5ML IJ SOLN
INTRAMUSCULAR | Status: AC
  Filled 2019-05-01: qty 25

## 2019-05-01 MED ORDER — TRAMADOL HCL 50 MG PO TABS
50.0000 mg | ORAL_TABLET | ORAL | Status: DC | PRN
  Administered 2019-05-05 – 2019-05-06 (×4): 100 mg via ORAL
  Filled 2019-05-01 (×4): qty 2

## 2019-05-01 MED ORDER — ORAL CARE MOUTH RINSE
15.0000 mL | OROMUCOSAL | Status: DC
  Administered 2019-05-01 (×2): 15 mL via OROMUCOSAL

## 2019-05-01 MED ORDER — MIDAZOLAM HCL 2 MG/2ML IJ SOLN: 2.0000 mg | INTRAMUSCULAR | Status: DC | PRN

## 2019-05-01 MED ORDER — VANCOMYCIN HCL 1000 MG IV SOLR
INTRAVENOUS | Status: AC
  Filled 2019-05-01: qty 3000

## 2019-05-01 MED ORDER — DEXMEDETOMIDINE HCL IN NACL 400 MCG/100ML IV SOLN
0.1000 ug/kg/h | INTRAVENOUS | Status: AC
  Administered 2019-05-01 (×2): .7 ug/kg/h via INTRAVENOUS
  Filled 2019-05-01 (×2): qty 100

## 2019-05-01 MED ORDER — ROCURONIUM BROMIDE 10 MG/ML (PF) SYRINGE
PREFILLED_SYRINGE | INTRAVENOUS | Status: DC | PRN
  Administered 2019-05-01: 100 mg via INTRAVENOUS
  Administered 2019-05-01: 30 mg via INTRAVENOUS
  Administered 2019-05-01: 10 mg via INTRAVENOUS
  Administered 2019-05-01: 20 mg via INTRAVENOUS
  Administered 2019-05-01 (×2): 30 mg via INTRAVENOUS
  Administered 2019-05-01 (×2): 50 mg via INTRAVENOUS

## 2019-05-01 MED ORDER — PROTAMINE SULFATE 10 MG/ML IV SOLN
INTRAVENOUS | Status: DC | PRN
  Administered 2019-05-01: 400 mg via INTRAVENOUS

## 2019-05-01 MED ORDER — ROCURONIUM BROMIDE 10 MG/ML (PF) SYRINGE
PREFILLED_SYRINGE | INTRAVENOUS | Status: AC
  Filled 2019-05-01: qty 10

## 2019-05-01 MED ORDER — SODIUM CHLORIDE 0.9% FLUSH
3.0000 mL | Freq: Two times a day (BID) | INTRAVENOUS | Status: DC
  Administered 2019-05-02 – 2019-05-08 (×14): 3 mL via INTRAVENOUS

## 2019-05-01 MED ORDER — SODIUM CHLORIDE 0.9% IV SOLUTION: Freq: Once | INTRAVENOUS | Status: DC

## 2019-05-01 MED ORDER — VANCOMYCIN HCL IN DEXTROSE 1-5 GM/200ML-% IV SOLN
1000.0000 mg | Freq: Once | INTRAVENOUS | Status: AC
  Administered 2019-05-01: 1000 mg via INTRAVENOUS
  Filled 2019-05-01: qty 200

## 2019-05-01 MED ORDER — PHENYLEPHRINE HCL-NACL 20-0.9 MG/250ML-% IV SOLN
30.0000 ug/min | INTRAVENOUS | Status: AC
  Administered 2019-05-01: 25 ug/min via INTRAVENOUS
  Administered 2019-05-01: 5 ug/min via INTRAVENOUS
  Filled 2019-05-01 (×2): qty 250

## 2019-05-01 MED ORDER — FAMOTIDINE IN NACL 20-0.9 MG/50ML-% IV SOLN
20.0000 mg | Freq: Two times a day (BID) | INTRAVENOUS | Status: AC
  Administered 2019-05-01 – 2019-05-02 (×2): 20 mg via INTRAVENOUS
  Filled 2019-05-01 (×2): qty 50

## 2019-05-01 MED ORDER — PRAVASTATIN SODIUM 40 MG PO TABS
40.0000 mg | ORAL_TABLET | Freq: Every day | ORAL | Status: DC
  Administered 2019-05-03: 40 mg via ORAL
  Filled 2019-05-01: qty 1

## 2019-05-01 MED ORDER — OXYCODONE HCL 5 MG PO TABS
5.0000 mg | ORAL_TABLET | ORAL | Status: DC | PRN
  Administered 2019-05-03 – 2019-05-04 (×3): 5 mg via ORAL
  Administered 2019-05-05 – 2019-05-09 (×11): 10 mg via ORAL
  Filled 2019-05-01: qty 1
  Filled 2019-05-01 (×10): qty 2
  Filled 2019-05-01: qty 1
  Filled 2019-05-01: qty 2
  Filled 2019-05-01 (×2): qty 1

## 2019-05-01 MED ORDER — HEPARIN SODIUM (PORCINE) 1000 UNIT/ML IJ SOLN
INTRAMUSCULAR | Status: AC
  Filled 2019-05-01: qty 1

## 2019-05-01 MED ORDER — ACETAMINOPHEN 500 MG PO TABS
1000.0000 mg | ORAL_TABLET | Freq: Four times a day (QID) | ORAL | Status: AC
  Administered 2019-05-03 – 2019-05-06 (×10): 1000 mg via ORAL
  Filled 2019-05-01 (×10): qty 2

## 2019-05-01 MED ORDER — SODIUM CHLORIDE 0.45 % IV SOLN: INTRAVENOUS | Status: DC | PRN

## 2019-05-01 MED ORDER — BISACODYL 5 MG PO TBEC
10.0000 mg | DELAYED_RELEASE_TABLET | Freq: Every day | ORAL | Status: DC
  Administered 2019-05-04 – 2019-05-09 (×5): 10 mg via ORAL
  Filled 2019-05-01 (×6): qty 2

## 2019-05-01 MED ORDER — NITROGLYCERIN IN D5W 200-5 MCG/ML-% IV SOLN
2.0000 ug/min | INTRAVENOUS | Status: AC
  Administered 2019-05-01: 10 ug/min via INTRAVENOUS
  Filled 2019-05-01 (×2): qty 250

## 2019-05-01 MED ORDER — DEXMEDETOMIDINE HCL IN NACL 400 MCG/100ML IV SOLN
0.0000 ug/kg/h | INTRAVENOUS | Status: DC
  Administered 2019-05-01: 0.4 ug/kg/h via INTRAVENOUS
  Administered 2019-05-01: 0.7 ug/kg/h via INTRAVENOUS
  Filled 2019-05-01: qty 100

## 2019-05-01 MED ORDER — FENTANYL CITRATE (PF) 250 MCG/5ML IJ SOLN
INTRAMUSCULAR | Status: DC | PRN
  Administered 2019-05-01: 150 ug via INTRAVENOUS
  Administered 2019-05-01: 100 ug via INTRAVENOUS
  Administered 2019-05-01: 150 ug via INTRAVENOUS
  Administered 2019-05-01: 100 ug via INTRAVENOUS
  Administered 2019-05-01: 50 ug via INTRAVENOUS
  Administered 2019-05-01: 250 ug via INTRAVENOUS
  Administered 2019-05-01: 50 ug via INTRAVENOUS
  Administered 2019-05-01: 100 ug via INTRAVENOUS
  Administered 2019-05-01 (×2): 150 ug via INTRAVENOUS

## 2019-05-01 MED ORDER — PROTAMINE SULFATE 10 MG/ML IV SOLN
INTRAVENOUS | Status: AC
  Filled 2019-05-01: qty 25

## 2019-05-01 MED ORDER — SUCRALFATE 1 G PO TABS: 1.0000 g | ORAL_TABLET | Freq: Three times a day (TID) | ORAL | Status: DC

## 2019-05-01 MED ORDER — ASPIRIN EC 325 MG PO TBEC
325.0000 mg | DELAYED_RELEASE_TABLET | Freq: Every day | ORAL | Status: DC
  Administered 2019-05-05 – 2019-05-09 (×3): 325 mg via ORAL
  Filled 2019-05-01 (×3): qty 1

## 2019-05-01 MED ORDER — EPINEPHRINE HCL 5 MG/250ML IV SOLN IN NS
0.0000 ug/min | INTRAVENOUS | Status: AC
  Administered 2019-05-01: 3 ug/min via INTRAVENOUS
  Filled 2019-05-01 (×2): qty 250

## 2019-05-01 MED ORDER — ACETAMINOPHEN 650 MG RE SUPP: 650.0000 mg | Freq: Once | RECTAL | Status: DC

## 2019-05-01 MED ORDER — NITROGLYCERIN IN D5W 200-5 MCG/ML-% IV SOLN
0.0000 ug/min | INTRAVENOUS | Status: DC
  Administered 2019-05-01: 10 ug/min via INTRAVENOUS
  Filled 2019-05-01: qty 250

## 2019-05-01 MED ORDER — MANNITOL 20 % IV SOLN
INTRAVENOUS | Status: DC
  Filled 2019-05-01: qty 13

## 2019-05-01 MED ORDER — DOCUSATE SODIUM 100 MG PO CAPS
200.0000 mg | ORAL_CAPSULE | Freq: Every day | ORAL | Status: DC
  Administered 2019-05-06 – 2019-05-09 (×4): 200 mg via ORAL
  Filled 2019-05-01 (×5): qty 2

## 2019-05-01 MED ORDER — VANCOMYCIN HCL 1500 MG/300ML IV SOLN
1500.0000 mg | INTRAVENOUS | Status: AC
  Administered 2019-05-01: 1500 mg via INTRAVENOUS
  Filled 2019-05-01 (×2): qty 300

## 2019-05-01 MED ORDER — TRANEXAMIC ACID 1000 MG/10ML IV SOLN
1.5000 mg/kg/h | Freq: Once | INTRAVENOUS | Status: AC
  Administered 2019-05-01: 1.5 mg/kg/h via INTRAVENOUS

## 2019-05-01 MED ORDER — ASPIRIN 81 MG PO CHEW
324.0000 mg | CHEWABLE_TABLET | Freq: Every day | ORAL | Status: DC
  Administered 2019-05-02 – 2019-05-08 (×5): 324 mg
  Filled 2019-05-01 (×7): qty 4

## 2019-05-01 MED ORDER — SODIUM CHLORIDE 0.9 % IV SOLN
20.0000 ug | Freq: Once | INTRAVENOUS | Status: AC
  Administered 2019-05-01: 20 ug via INTRAVENOUS
  Filled 2019-05-01: qty 5

## 2019-05-01 MED ORDER — METOCLOPRAMIDE HCL 5 MG/ML IJ SOLN
10.0000 mg | Freq: Four times a day (QID) | INTRAMUSCULAR | Status: AC
  Administered 2019-05-01 – 2019-05-02 (×3): 10 mg via INTRAVENOUS
  Filled 2019-05-01 (×3): qty 2

## 2019-05-01 MED ORDER — DEXTROSE 50 % IV SOLN: 0.0000 mL | INTRAVENOUS | Status: DC | PRN

## 2019-05-01 MED ORDER — PROPOFOL 1000 MG/100ML IV EMUL
5.0000 ug/kg/min | INTRAVENOUS | Status: DC
  Filled 2019-05-01: qty 100

## 2019-05-01 MED ORDER — PHENYLEPHRINE HCL-NACL 20-0.9 MG/250ML-% IV SOLN: 0.0000 ug/min | INTRAVENOUS | Status: DC

## 2019-05-01 MED ORDER — ACETAMINOPHEN 160 MG/5ML PO SOLN
1000.0000 mg | Freq: Four times a day (QID) | ORAL | Status: AC
  Administered 2019-05-01 – 2019-05-05 (×3): 1000 mg
  Filled 2019-05-01 (×4): qty 40.6

## 2019-05-01 MED ORDER — METOPROLOL TARTRATE 25 MG/10 ML ORAL SUSPENSION: 12.5000 mg | Freq: Two times a day (BID) | ORAL | Status: DC

## 2019-05-01 MED ORDER — PANTOPRAZOLE SODIUM 40 MG PO TBEC: 40.0000 mg | DELAYED_RELEASE_TABLET | Freq: Every day | ORAL | Status: DC

## 2019-05-01 MED ORDER — ACETAMINOPHEN 160 MG/5ML PO SOLN: 650.0000 mg | Freq: Once | ORAL | Status: DC

## 2019-05-01 MED ORDER — VECURONIUM BROMIDE 10 MG IV SOLR: INTRAVENOUS | Status: DC | PRN

## 2019-05-01 MED ORDER — SODIUM CHLORIDE (PF) 0.9 % IJ SOLN
INTRAMUSCULAR | Status: AC
  Filled 2019-05-01: qty 10

## 2019-05-01 MED ORDER — HEPARIN SODIUM (PORCINE) 1000 UNIT/ML IJ SOLN
INTRAMUSCULAR | Status: DC | PRN
  Administered 2019-05-01: 35000 [IU] via INTRAVENOUS
  Administered 2019-05-01: 5000 [IU] via INTRAVENOUS

## 2019-05-01 MED ORDER — DEXMEDETOMIDINE HCL IN NACL 200 MCG/50ML IV SOLN
INTRAVENOUS | Status: AC
  Filled 2019-05-01: qty 50

## 2019-05-01 MED ORDER — ETOMIDATE 2 MG/ML IV SOLN
INTRAVENOUS | Status: AC
  Filled 2019-05-01: qty 20

## 2019-05-01 MED ORDER — NITROGLYCERIN 0.2 MG/ML ON CALL CATH LAB
INTRAVENOUS | Status: DC | PRN
  Administered 2019-05-01 (×2): 40 ug via INTRAVENOUS
  Administered 2019-05-01: 20 ug via INTRAVENOUS
  Administered 2019-05-01: 40 ug via INTRAVENOUS
  Administered 2019-05-01: 60 ug via INTRAVENOUS
  Administered 2019-05-01 (×2): 40 ug via INTRAVENOUS

## 2019-05-01 MED ORDER — POTASSIUM CHLORIDE 10 MEQ/50ML IV SOLN: 10.0000 meq | INTRAVENOUS | Status: AC

## 2019-05-01 MED ORDER — INSULIN REGULAR(HUMAN) IN NACL 100-0.9 UT/100ML-% IV SOLN
INTRAVENOUS | Status: AC
  Administered 2019-05-01: 1 [IU]/h via INTRAVENOUS
  Filled 2019-05-01: qty 100

## 2019-05-01 MED ORDER — MAGNESIUM SULFATE 50 % IJ SOLN
40.0000 meq | INTRAMUSCULAR | Status: DC
  Filled 2019-05-01 (×2): qty 9.85

## 2019-05-01 MED ORDER — TRANEXAMIC ACID (OHS) BOLUS VIA INFUSION
15.0000 mg/kg | INTRAVENOUS | Status: AC
  Administered 2019-05-01: 1725 mg via INTRAVENOUS
  Filled 2019-05-01 (×2): qty 1725

## 2019-05-01 MED ORDER — NICARDIPINE HCL IN NACL 20-0.86 MG/200ML-% IV SOLN
3.0000 mg/h | INTRAVENOUS | Status: DC
  Filled 2019-05-01: qty 200

## 2019-05-01 MED ORDER — CHLORHEXIDINE GLUCONATE 0.12 % MT SOLN
15.0000 mL | OROMUCOSAL | Status: AC
  Administered 2019-05-01: 15 mL via OROMUCOSAL

## 2019-05-01 MED ORDER — MAGNESIUM SULFATE 4 GM/100ML IV SOLN
4.0000 g | Freq: Once | INTRAVENOUS | Status: AC
  Administered 2019-05-01: 4 g via INTRAVENOUS
  Filled 2019-05-01: qty 100

## 2019-05-01 MED ORDER — SODIUM CHLORIDE 0.9 % IV SOLN: INTRAVENOUS | Status: DC | PRN

## 2019-05-01 MED ORDER — SODIUM CHLORIDE 0.9 % IV SOLN: INTRAVENOUS | Status: DC

## 2019-05-01 MED ORDER — METHYLPREDNISOLONE SODIUM SUCC 125 MG IJ SOLR
125.0000 mg | INTRAMUSCULAR | Status: AC
  Administered 2019-05-01: 125 mg via INTRAVENOUS
  Filled 2019-05-01: qty 2

## 2019-05-01 MED ORDER — SODIUM CHLORIDE 0.9 % IV SOLN
1.5000 g | Freq: Two times a day (BID) | INTRAVENOUS | Status: AC
  Administered 2019-05-01 – 2019-05-03 (×4): 1.5 g via INTRAVENOUS
  Filled 2019-05-01 (×4): qty 1.5

## 2019-05-01 MED ORDER — SODIUM CHLORIDE 0.9 % IV SOLN
750.0000 mg | INTRAVENOUS | Status: AC
  Administered 2019-05-01: 750 mg via INTRAVENOUS
  Filled 2019-05-01 (×2): qty 750

## 2019-05-01 MED ORDER — TRANEXAMIC ACID (OHS) PUMP PRIME SOLUTION
2.0000 mg/kg | INTRAVENOUS | Status: DC
  Filled 2019-05-01 (×2): qty 2.3

## 2019-05-01 MED ORDER — INSULIN REGULAR(HUMAN) IN NACL 100-0.9 UT/100ML-% IV SOLN
INTRAVENOUS | Status: DC
  Administered 2019-05-01: 1.3 [IU]/h via INTRAVENOUS

## 2019-05-01 MED ORDER — PROPOFOL 10 MG/ML IV BOLUS
INTRAVENOUS | Status: DC | PRN
  Administered 2019-05-01 (×3): 50 mg via INTRAVENOUS
  Administered 2019-05-01: 20 mg via INTRAVENOUS

## 2019-05-01 MED ORDER — CHLORHEXIDINE GLUCONATE 0.12% ORAL RINSE (MEDLINE KIT): 15.0000 mL | Freq: Two times a day (BID) | OROMUCOSAL | Status: DC

## 2019-05-01 MED ORDER — IOHEXOL 350 MG/ML SOLN
150.0000 mL | Freq: Once | INTRAVENOUS | Status: AC | PRN
  Administered 2019-05-01: 150 mL via INTRAVENOUS

## 2019-05-01 MED ORDER — SODIUM CHLORIDE (PF) 0.9 % IJ SOLN
OROMUCOSAL | Status: DC | PRN
  Administered 2019-05-01 (×3): 4 mL via TOPICAL

## 2019-05-01 MED ORDER — ROCURONIUM BROMIDE 10 MG/ML (PF) SYRINGE
PREFILLED_SYRINGE | INTRAVENOUS | Status: AC
  Filled 2019-05-01: qty 40

## 2019-05-01 SURGICAL SUPPLY — 123 items
ADAPTER CARDIO PERF ANTE/RETRO (ADAPTER) ×4 IMPLANT
APPLICATOR TIP STD SYR BGAT-SY (MISCELLANEOUS) ×2 IMPLANT
BAG DECANTER FOR FLEXI CONT (MISCELLANEOUS) ×4 IMPLANT
BASKET HEART  (ORDER IN 25'S) (MISCELLANEOUS)
BASKET HEART (ORDER IN 25'S) (MISCELLANEOUS)
BASKET HEART (ORDER IN 25S) (MISCELLANEOUS) ×2 IMPLANT
BATTERY MAXDRIVER (MISCELLANEOUS) ×2 IMPLANT
BLADE CLIPPER SURG (BLADE) ×4 IMPLANT
BLADE STERNUM SYSTEM 6 (BLADE) ×4 IMPLANT
BNDG ELASTIC 4X5.8 VLCR STR LF (GAUZE/BANDAGES/DRESSINGS) ×2 IMPLANT
BNDG ELASTIC 6X5.8 VLCR STR LF (GAUZE/BANDAGES/DRESSINGS) ×2 IMPLANT
BNDG GAUZE ELAST 4 BULKY (GAUZE/BANDAGES/DRESSINGS) ×2 IMPLANT
CANISTER SUCT 3000ML PPV (MISCELLANEOUS) ×4 IMPLANT
CANNULA SUMP PERICARDIAL (CANNULA) ×2 IMPLANT
CATH CPB KIT HENDRICKSON (MISCELLANEOUS) ×4 IMPLANT
CATH HEART VENT LEFT (CATHETERS) IMPLANT
CATH ROBINSON RED A/P 18FR (CATHETERS) ×10 IMPLANT
CLIP RETRACTION 3.0MM CORONARY (MISCELLANEOUS) ×2 IMPLANT
CLIP VESOCCLUDE LG 6/CT (CLIP) ×2 IMPLANT
CLIP VESOCCLUDE SM WIDE 24/CT (CLIP) ×6 IMPLANT
CONN ST 1/4X3/8  BEN (MISCELLANEOUS) ×3
CONN ST 1/4X3/8 BEN (MISCELLANEOUS) IMPLANT
DERMABOND ADHESIVE PROPEN (GAUZE/BANDAGES/DRESSINGS) ×2
DERMABOND ADVANCED (GAUZE/BANDAGES/DRESSINGS) ×4
DERMABOND ADVANCED .7 DNX12 (GAUZE/BANDAGES/DRESSINGS) ×2 IMPLANT
DERMABOND ADVANCED .7 DNX6 (GAUZE/BANDAGES/DRESSINGS) IMPLANT
DRAIN CHANNEL 28F RND 3/8 FF (WOUND CARE) ×8 IMPLANT
DRAIN CHANNEL 32F RND 10.7 FF (WOUND CARE) ×4 IMPLANT
DRAPE CARDIOVASCULAR INCISE (DRAPES) ×4
DRAPE SLUSH/WARMER DISC (DRAPES) ×4 IMPLANT
DRAPE SRG 135X102X78XABS (DRAPES) ×2 IMPLANT
DRSG AQUACEL AG ADV 3.5X14 (GAUZE/BANDAGES/DRESSINGS) ×4 IMPLANT
ELECT BLADE 6.5 EXT (BLADE) ×2 IMPLANT
ELECT CAUTERY BLADE 6.4 (BLADE) ×4 IMPLANT
ELECT REM PT RETURN 9FT ADLT (ELECTROSURGICAL) ×8
ELECTRODE REM PT RTRN 9FT ADLT (ELECTROSURGICAL) ×4 IMPLANT
FELT TEFLON 1X6 (MISCELLANEOUS) ×12 IMPLANT
FELT TEFLON 6X6 (MISCELLANEOUS) ×2 IMPLANT
GAUZE SPONGE 4X4 12PLY STRL (GAUZE/BANDAGES/DRESSINGS) ×6 IMPLANT
GAUZE SPONGE 4X4 12PLY STRL LF (GAUZE/BANDAGES/DRESSINGS) ×2 IMPLANT
GLOVE BIO SURGEON STRL SZ 6.5 (GLOVE) ×3 IMPLANT
GLOVE BIO SURGEON STRL SZ8.5 (GLOVE) ×2 IMPLANT
GLOVE BIO SURGEONS STRL SZ 6.5 (GLOVE) ×3
GLOVE BIOGEL PI IND STRL 6.5 (GLOVE) IMPLANT
GLOVE BIOGEL PI IND STRL 7.5 (GLOVE) IMPLANT
GLOVE BIOGEL PI INDICATOR 6.5 (GLOVE) ×4
GLOVE BIOGEL PI INDICATOR 7.5 (GLOVE) ×2
GLOVE NEODERM STRL 7.5  LF PF (GLOVE) ×10
GLOVE NEODERM STRL 7.5 LF PF (GLOVE) ×6 IMPLANT
GLOVE SURG NEODERM 7.5  LF PF (GLOVE) ×10
GOWN STRL REUS W/ TWL LRG LVL3 (GOWN DISPOSABLE) ×8 IMPLANT
GOWN STRL REUS W/TWL LRG LVL3 (GOWN DISPOSABLE) ×36
GRAFT HEMASHIELD 10MM (Graft) ×4 IMPLANT
GRAFT VASC STRG 30X10STRL (Graft) IMPLANT
GRAFT WOVEN D/V 28DX30L (Vascular Products) ×2 IMPLANT
HEMOSTAT POWDER SURGIFOAM 1G (HEMOSTASIS) ×10 IMPLANT
INSERT FOGARTY SM (MISCELLANEOUS) ×2 IMPLANT
INSERT FOGARTY XLG (MISCELLANEOUS) ×2 IMPLANT
IV ADAPTER SYR DOUBLE MALE LL (MISCELLANEOUS) ×2 IMPLANT
KIT BASIN OR (CUSTOM PROCEDURE TRAY) ×4 IMPLANT
KIT SUCTION CATH 14FR (SUCTIONS) ×4 IMPLANT
KIT TURNOVER KIT B (KITS) ×4 IMPLANT
KIT VASOVIEW HEMOPRO 2 VH 4000 (KITS) ×2 IMPLANT
LINE VENT (MISCELLANEOUS) ×2 IMPLANT
MARKER GRAFT CORONARY BYPASS (MISCELLANEOUS) ×6 IMPLANT
NDL 18GX1X1/2 (RX/OR ONLY) (NEEDLE) ×2 IMPLANT
NEEDLE 18GX1X1/2 (RX/OR ONLY) (NEEDLE) IMPLANT
NS IRRIG 1000ML POUR BTL (IV SOLUTION) ×22 IMPLANT
PACK E OPEN HEART (SUTURE) ×4 IMPLANT
PACK OPEN HEART (CUSTOM PROCEDURE TRAY) ×4 IMPLANT
PACK SPY-PHI (KITS) IMPLANT
PAD ARMBOARD 7.5X6 YLW CONV (MISCELLANEOUS) ×8 IMPLANT
PAD ELECT DEFIB RADIOL ZOLL (MISCELLANEOUS) ×4 IMPLANT
PENCIL BUTTON HOLSTER BLD 10FT (ELECTRODE) ×4 IMPLANT
PLATE STERNAL 2.3X208 14H 2-PK (Plate) ×2 IMPLANT
POSITIONER HEAD DONUT 9IN (MISCELLANEOUS) ×4 IMPLANT
POWDER SURGICEL 3.0 GRAM (HEMOSTASIS) ×2 IMPLANT
SCREW LOCKING TI 2.3X13MM (Screw) ×4 IMPLANT
SCREW STERNAL 2.3X17MM (Screw) ×12 IMPLANT
SCREW STERNAL LOCK 2.3MM (Screw) ×8 IMPLANT
SEALANT SURG COSEAL 8ML (VASCULAR PRODUCTS) ×2 IMPLANT
SET CARDIOPLEGIA MPS 5001102 (MISCELLANEOUS) ×2 IMPLANT
SPONGE LAP 18X18 RF (DISPOSABLE) ×2 IMPLANT
SPONGE LAP 4X18 RFD (DISPOSABLE) ×2 IMPLANT
SUT BONE WAX W31G (SUTURE) ×4 IMPLANT
SUT ETHIBOND X763 2 0 SH 1 (SUTURE) ×4 IMPLANT
SUT MNCRL AB 3-0 PS2 18 (SUTURE) ×10 IMPLANT
SUT PDS AB 1 CTX 36 (SUTURE) ×8 IMPLANT
SUT PROLENE 3 0 SH 48 (SUTURE) ×6 IMPLANT
SUT PROLENE 3 0 SH DA (SUTURE) ×14 IMPLANT
SUT PROLENE 4 0 RB 1 (SUTURE) ×32
SUT PROLENE 4 0 SH DA (SUTURE) ×4 IMPLANT
SUT PROLENE 4-0 RB1 .5 CRCL 36 (SUTURE) IMPLANT
SUT PROLENE 5 0 C 1 36 (SUTURE) ×2 IMPLANT
SUT PROLENE 6 0 C 1 30 (SUTURE) ×16 IMPLANT
SUT PROLENE 8 0 BV175 6 (SUTURE) IMPLANT
SUT PROLENE BLUE 7 0 (SUTURE) ×4 IMPLANT
SUT SILK 2 0 SH CR/8 (SUTURE) IMPLANT
SUT SILK 3 0 SH CR/8 (SUTURE) IMPLANT
SUT STEEL 6MS V (SUTURE) ×4 IMPLANT
SUT STEEL SZ 6 DBL 3X14 BALL (SUTURE) ×6 IMPLANT
SUT VIC AB 2-0 CT1 27 (SUTURE) ×4
SUT VIC AB 2-0 CT1 TAPERPNT 27 (SUTURE) IMPLANT
SUT VIC AB 2-0 CTX 27 (SUTURE) IMPLANT
SUT VIC AB 3-0 X1 27 (SUTURE) IMPLANT
SYR 10ML KIT SKIN ADHESIVE (MISCELLANEOUS) ×2 IMPLANT
SYR 10ML LL (SYRINGE) IMPLANT
SYR 30ML LL (SYRINGE) ×2 IMPLANT
SYR 3ML LL SCALE MARK (SYRINGE) ×2 IMPLANT
SYSTEM SAHARA CHEST DRAIN ATS (WOUND CARE) ×4 IMPLANT
TAPE CLOTH SURG 4X10 WHT LF (GAUZE/BANDAGES/DRESSINGS) ×4 IMPLANT
TAPE PAPER 2X10 WHT MICROPORE (GAUZE/BANDAGES/DRESSINGS) ×2 IMPLANT
TOWEL GREEN STERILE (TOWEL DISPOSABLE) ×4 IMPLANT
TOWEL GREEN STERILE FF (TOWEL DISPOSABLE) ×4 IMPLANT
TRAY FOLEY SLVR 16FR TEMP STAT (SET/KITS/TRAYS/PACK) ×4 IMPLANT
TUBE SUCT INTRACARD DLP 20F (MISCELLANEOUS) ×2 IMPLANT
TUBING ART PRESS 48 MALE/FEM (TUBING) ×4 IMPLANT
TUBING LAP HI FLOW INSUFFLATIO (TUBING) ×2 IMPLANT
UNDERPAD 30X30 (UNDERPADS AND DIAPERS) ×4 IMPLANT
VENT LEFT HEART 12002 (CATHETERS) ×4
WATER STERILE IRR 1000ML POUR (IV SOLUTION) ×8 IMPLANT
WATER STERILE IRR 1000ML UROMA (IV SOLUTION) IMPLANT
YANKAUER SUCT BULB TIP NO VENT (SUCTIONS) ×2 IMPLANT

## 2019-05-01 NOTE — ED Notes (Signed)
Patient left unit with [facility] 

## 2019-05-01 NOTE — Anesthesia Preprocedure Evaluation (Addendum)
Anesthesia Evaluation  Patient identified by MRN, date of birth, ID band Patient confused    Reviewed: Allergy & Precautions, NPO status , Patient's Chart, lab work & pertinent test results, Unable to perform ROS - Chart review onlyPreop documentation limited or incomplete due to emergent nature of procedure.  Airway Mallampati: II  TM Distance: >3 FB Neck ROM: Full    Dental  (+) Teeth Intact, Dental Advisory Given   Pulmonary Current Smoker and Patient abstained from smoking.,    Pulmonary exam normal breath sounds clear to auscultation       Cardiovascular hypertension, Pt. on medications Normal cardiovascular exam Rhythm:Regular Rate:Normal  AORTIC DISSECTION   Neuro/Psych negative neurological ROS     GI/Hepatic Neg liver ROS, GERD  Medicated,  Endo/Other  Obesity   Renal/GU negative Renal ROS     Musculoskeletal negative musculoskeletal ROS (+)   Abdominal   Peds  Hematology negative hematology ROS (+)   Anesthesia Other Findings Day of surgery medications reviewed with the patient.  Reproductive/Obstetrics                            Anesthesia Physical Anesthesia Plan  ASA: IV and emergent  Anesthesia Plan: General   Post-op Pain Management:    Induction: Intravenous  PONV Risk Score and Plan: 1 and Treatment may vary due to age or medical condition  Airway Management Planned: Oral ETT  Additional Equipment: Arterial line, CVP, PA Cath, TEE and Ultrasound Guidance Line Placement  Intra-op Plan:   Post-operative Plan: Post-operative intubation/ventilation  Informed Consent:     History available from chart only and Only emergency history available  Plan Discussed with: CRNA  Anesthesia Plan Comments: (Emergency consent. Patient non-verbal. Ardelia Mems to contact wife. Lines placed emergently in pre-op and patient taken to OR.)       Anesthesia Quick Evaluation

## 2019-05-01 NOTE — Anesthesia Procedure Notes (Signed)
Procedure Name: Intubation Date/Time: 05/01/2019 10:03 AM Performed by: Colin Benton, CRNA Pre-anesthesia Checklist: Patient identified, Emergency Drugs available, Suction available and Patient being monitored Patient Re-evaluated:Patient Re-evaluated prior to induction Oxygen Delivery Method: Circle system utilized Preoxygenation: Pre-oxygenation with 100% oxygen Induction Type: IV induction Ventilation: Mask ventilation without difficulty Laryngoscope Size: Mac and 4 Grade View: Grade I Tube type: Oral Tube size: 8.0 mm Number of attempts: 1 Airway Equipment and Method: Stylet Placement Confirmation: ETT inserted through vocal cords under direct vision,  positive ETCO2 and breath sounds checked- equal and bilateral Secured at: 23 cm Tube secured with: Tape Dental Injury: Teeth and Oropharynx as per pre-operative assessment

## 2019-05-01 NOTE — Brief Op Note (Signed)
05/01/2019  4:31 PM  PATIENT:  Erik Parker  54 y.o. 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   Delay start of Pharmacological VTE agent (>24hrs) due to surgical blood loss or risk of bleeding: yes

## 2019-05-01 NOTE — Progress Notes (Signed)
Initiated heart wean per protocol after patient has rested some and become more awake.  40% and rate of 4 started at this time.

## 2019-05-01 NOTE — Progress Notes (Signed)
This note also relates to the following rows which could not be included: SpO2 - Cannot attach notes to unvalidated device data  Patient started on Heart Wean Step 1.

## 2019-05-01 NOTE — Anesthesia Procedure Notes (Signed)
Central Venous Catheter Insertion Performed by: Catalina Gravel, MD, anesthesiologist Start/End3/04/2019 9:15 AM, 05/01/2019 9:25 AM Patient location: Pre-op. Emergency situation Preanesthetic checklist: patient identified, IV checked, site marked, risks and benefits discussed, surgical consent, monitors and equipment checked, pre-op evaluation, timeout performed and anesthesia consent Position: Trendelenburg Lidocaine 1% used for infiltration and patient sedated Hand hygiene performed , maximum sterile barriers used  and Seldinger technique used Catheter size: 9 Fr Central line was placed.MAC introducer Procedure performed using ultrasound guided technique. Ultrasound Notes:anatomy identified, needle tip was noted to be adjacent to the nerve/plexus identified, no ultrasound evidence of intravascular and/or intraneural injection and image(s) printed for medical record Attempts: 1 Following insertion, line sutured, dressing applied and Biopatch. Post procedure assessment: free fluid flow, blood return through all ports and no air  Patient tolerated the procedure well with no immediate complications.

## 2019-05-01 NOTE — Consult Note (Addendum)
[facility] Consult Services   Date of Service:   05/01/2019 07:11:00  Impression:     .  I37.048 - Cerebrovascular accident (CVA) due to thrombosis of left middle cerebral artery (HCCC)  Comments/Sign-Out: Pt with hypertension and history of chest pain yesterday is nonverbal but has intact comprehension. Last well at bedtime yesterday 1800. Out of window for alteplase. CTA/P ordered and are pending, will review for LVO. If no LVO admission and addition of plavix to ASA recommended, MRI brain, TTE, telemetry.  Metrics: Last Known Well: 04/30/2019 18:00:00 [facility] Notification Time: 05/01/2019 07:11:00 Arrival Time: 05/01/2019 06:56:00 Stamp Time: 05/01/2019 07:11:00 Time First Login Attempt: 05/01/2019 07:14:44 Symptoms: speech changes, right sided weakness NIHSS Start Assessment Time: 05/01/2019 07:20:59 Patient is not a candidate for Alteplase/Activase. Alteplase Medical Decision: 05/01/2019 07:20:00 Patient was not deemed candidate for Alteplase/Activase thrombolytics because of Last Well Known Above 4.5 Hours.  CT head was reviewed and results were: IMPRESSION: 1. Left frontal operculum acute nonhemorrhagic infarct. 2. ASPECTS is 8/10 3. CTA head and neck and CT perfusion would be useful for further evaluation potential for intervention in this patient with an acute infarct.    Lower Likelihood of Large Vessel Occlusion but Following Stat Studies are Recommended  CTA Head and Neck. CT Perfusion- pending, will review when completed.    ED Physician notified of diagnostic impression and management plan on 05/01/2019 07:30:39  Our recommendations are outlined below.  Recommendations:     .  Activate Stroke Protocol Admission/Order Set     .  Stroke/Telemetry Floor     .  Neuro Checks     .  Bedside Swallow Eval     .  DVT Prophylaxis     .  IV Fluids, Normal Saline     .  Head of Bed 30 Degrees     .  Euglycemia and Avoid  Hyperthermia (PRN Acetaminophen)     .  Initiate Plavix 75 MG Daily     .  The etiology of the patient's symptoms is likely an acute cerebral infarction (stroke) in the left frontal region as identified on CT head. CTA/P pending, will review for LVO/NIR options.     .  Admission for further evaluation and workup is recommended, including MRI brain, Transthoracic Echocardiogram (TTE), fasting lipids, telemetry, blood pressure monitoring, swallow evaluation for dysphagia, and neurological followup.     .  Additionally an antiplatelet and high doses statin are recommended for stroke/TIA prophylaxis.     Marland Kitchen  Permissive hypertension is recommended for 24 hours followed by a goal of normotension.     .  Stroke risk factor modification should include a goal of LDL <70, BP <130/80, a heart-healthy diet, evaluation for the possibility of sleep apnea, and at least 40 mins of cardiovascular exercise 5 days per week.  Routine Consultation with Inhouse Neurology for Follow up Care  Sign Out:     .  Discussed with Emergency Department Provider    ------------------------------------------------------------------------------  History of Present Illness: Patient is a 54 year old Male.  Patient was brought by private transportation with symptoms of speech changes, right sided weakness  Pt awoke at 0730 with right sided weakness this morning, last known well at 6 p.m. yesterday when he went to bed. He had chest pain yesterday and was seen at the ER for that but no neurological symptoms. He has a history of hypertension and takes ASA. No prior similar symptoms.   Past Medical History:     .  Hypertension     . Hyperlipidemia     . There is NO history of Diabetes Mellitus     . There is NO history of Atrial Fibrillation     . There is NO history of Stroke   Antiplatelet use: asa 81    Examination: BP(127/51), Pulse(60), Blood Glucose(145) 1A: Level of Consciousness - Alert; keenly responsive +  0 1B: Ask Month and Age - Both Questions Right + 0 1C: Blink Eyes & Squeeze Hands - Performs Both Tasks + 0 2: Test Horizontal Extraocular Movements - Normal + 0 3: Test Visual Fields - No Visual Loss + 0 4: Test Facial Palsy (Use Grimace if Obtunded) - Partial paralysis (lower face) + 2 5A: Test Left Arm Motor Drift - No Drift for 10 Seconds + 0 5B: Test Right Arm Motor Drift - Drift, but doesn't hit bed + 1 6A: Test Left Leg Motor Drift - No Drift for 5 Seconds + 0 6B: Test Right Leg Motor Drift - No Drift for 5 Seconds + 0 7: Test Limb Ataxia (FNF/Heel-Shin) - No Ataxia + 0 8: Test Sensation - Normal; No sensory loss + 0 9: Test Language/Aphasia - Severe Aphasia: Fragmentary Expression, Inference Needed, Cannot Identify Materials + 2 10: Test Dysarthria - Mute/Anarthric + 2 11: Test Extinction/Inattention - No abnormality + 0  NIHSS Score: 7  Pre-Morbid Modified Ranking Scale: 0 Points = No symptoms at all   Patient/Family was informed the Neurology Consult would occur via TeleHealth consult by way of interactive audio and video telecommunications and consented to receiving care in this manner.   Due to the immediate potential for life-threatening deterioration due to underlying acute neurologic illness, I spent 40 minutes providing critical care. This time includes time for face to face visit via telemedicine, review of medical records, imaging studies and discussion of findings with providers, the patient and/or family.   Dr Jeralene Huff   [facility] [phone]  Case [id number]  *Addendum: CTA/P reviewed. Aortic dissection present with dissection involving bilateral carotid arteries resulting in high grade stenosis vs occlusion right carotid. CTP withTmax> 6 seconds 23 ml in left frontal region. Discussed with ER MD and pt will be transferred for CV surgery.

## 2019-05-01 NOTE — Progress Notes (Signed)
  Echocardiogram Echocardiogram Transesophageal has been performed.  Gerda Diss 05/01/2019, 10:42 AM

## 2019-05-01 NOTE — ED Triage Notes (Signed)
Patient arrived via [facility] due to right side facial droop, right side weakness.

## 2019-05-01 NOTE — Progress Notes (Signed)
CT surgery p.m. Rounds  Patient sedated on ventilator after emergency repair of type a dissection Chest tubes 100 cc/h Hemodynamics stable with good urine output Patient's preoperative neuro status was with right upper extremity weakness and aphasia. With sedation wearing off patient's blood pressure became very high- will be cautious about weaning sedation associated with vent wean tonight

## 2019-05-01 NOTE — Progress Notes (Signed)
CODE STROKE CT TIMES 0638 CALL TIME 0648 BEEPER TIME 0658 EXAM STARTED 0659 EXAM FINISHED 700 IMAGES SENT TO SOC 700 EXAM COMPLETED 701 [facility] CALLED.

## 2019-05-01 NOTE — Progress Notes (Signed)
Wife called and updated told abput visiting only one person per hospital stay.  Passcode obtained. . Patient awoke an hour after OR moved all extremities. Had a high BP, decreased with combination of NTG, Morphine and Propofol. Dr. Donata Clay on tonight. Wants to be notified before awaking patient and weaning. Increase in CT output ( he is awakre) will monitor . Night shfit aware of plan with awaking patient and weaning.

## 2019-05-01 NOTE — Brief Op Note (Signed)
05/01/2019  7:41 AM  PATIENT:  Erik Parker  54 y.o. male  PRE-OPERATIVE DIAGNOSIS:  AORTIC DISSECTION  POST-OPERATIVE DIAGNOSIS:  AORTIC DISSECTION  PROCEDURE:  Procedure(s):  REPAIR OF ACUTE ASCENDING THORACIC AORTIC DISSECTION  - Hypothermic Circulatory Arrest - Replacement of Ascending Aorta with a 28 mm Hemashield Graft with Hemi Arch Replacement  SURGEON:  Surgeon(s) and Role:    * Linden Dolin, MD - Primary  PHYSICIAN ASSISTANT: Harlow Basley PA-C  ANESTHESIA:   general  EBL:  2131 mL   BLOOD ADMINISTERED:  DRAINS:  Mediastinal Chest Drains    LOCAL MEDICATIONS USED:  NONE  SPECIMEN:  Source of Specimen:  Ascening Aorta  DISPOSITION OF SPECIMEN:  PATHOLOGY  COUNTS:  YES  TOURNIQUET:  * No tourniquets in log *  DICTATION: .Dragon Dictation  PLAN OF CARE: Admit to inpatient   PATIENT DISPOSITION:  ICU - intubated and hemodynamically stable.   Delay start of Pharmacological VTE agent (>24hrs) due to surgical blood loss or risk of bleeding: yes

## 2019-05-01 NOTE — ED Provider Notes (Signed)
[facility] EMERGENCY DEPARTMENT Provider Note   CSN: [id number] Arrival date & time: 05/01/19  2725     History Chief Complaint  Patient presents with  . Facial Droop    right    Erik Parker is a 54 y.o. male.  5 caveat secondary to nonverbal.  He has a history of hypertension hyperlipidemia who is here yesterday with chest pain.  Ultimately was discharged.  Last known well was last night when wife left to go to work on third shift.  Unclear what time this was.  Wife found him this morning with right facial droop inability to speak right arm weakness.  Patient denies any headache.  Denies chest pain.  The history is provided by the EMS personnel and the patient. The history is limited by a language barrier.  Cerebrovascular Accident This is a new problem. The current episode started 6 to 12 hours ago. The problem occurs constantly. The problem has not changed since onset.Pertinent negatives include no chest pain, no abdominal pain, no headaches and no shortness of breath. Nothing aggravates the symptoms. Nothing relieves the symptoms. He has tried nothing for the symptoms. The treatment provided no relief.       Past Medical History:  Diagnosis Date  . Hyperlipidemia   . Hypertension     Patient Active Problem List   Diagnosis Date Noted  . Bilateral hand numbness 02/26/2019  . Decreased grip strength 02/26/2019    Past Surgical History:  Procedure Laterality Date  . ACHILLES TENDON SURGERY Right 11/10/2016   Procedure: ACHILLES TENDON REPAIR VIA SPEED BRIDGE;  Surgeon: [name], DPM;  Location: [facility];  Service: Podiatry;  Laterality: Right;  . HEEL SPUR RESECTION Right 11/10/2016   Procedure: HEEL SPUR RESECTION (HAGLUNDS DEFORMITY);  Surgeon: [name], DPM;  Location: [facility];  Service: Podiatry;  Laterality: Right;       No family history on file.  Social History   Tobacco Use  . Smoking status: Current Every Day Smoker    Packs/day: 0.50    Years: 9.00    Pack years: 4.50    Types: Cigarettes  . Smokeless tobacco: Never Used  Substance Use Topics  . Alcohol use: Yes    Comment: occ  . Drug use: No    Home Medications Prior to Admission medications   Medication Sig Start Date End Date Taking? Authorizing Provider  amLODipine (NORVASC) 10 MG tablet Take 10 mg by mouth daily. 10/12/16   [provider]  aspirin EC 81 MG tablet Take 81 mg by mouth daily.    [provider]  famotidine (PEPCID) 20 MG tablet Take 1 tablet (20 mg total) by mouth 2 (two) times daily. 04/30/19   Gerhard Munch, MD  folic acid (FOLVITE) 1 MG tablet Take 1 mg by mouth daily. 03/22/19   [provider]  hydrochlorothiazide (HYDRODIURIL) 25 MG tablet Take 25 mg by mouth daily.    [provider]  lovastatin (MEVACOR) 40 MG tablet Take 20 mg by mouth at bedtime.     [provider]  Methotrexate Sodium (METHOTREXATE, PF,) 50 MG/2ML injection INJECT 0.6 ML ONCE A WEEK 03/22/19   [provider]  predniSONE (DELTASONE) 5 MG tablet TAKE 6 TABS X 2 DAYS, 5 TAB X 2 DAYS, 4 TAB X 2 DAYS, 3 TAB X 2 DAYS, 2 TAB X 2 DAYS, 1 TAB X 2 DAYS 02/19/19   [provider]  sucralfate (CARAFATE) 1 g tablet Take 1 tablet (1  g total) by mouth 4 (four) times daily -  with meals and at bedtime. 04/30/19   Gerhard Munch, MD    Allergies    Patient has no known allergies.  Review of Systems   Review of Systems  Unable to perform ROS: Patient nonverbal  Respiratory: Negative for shortness of breath.   Cardiovascular: Negative for chest pain.  Gastrointestinal: Negative for abdominal pain.  Neurological: Negative for headaches.    Physical Exam Updated Vital Signs BP (!) 127/51   Pulse (!) 57   Temp 97.7 F (36.5 C) (Oral)   Resp 20   Ht 6' (1.829 m)   Wt 115 kg   SpO2 99%   BMI 34.38 kg/m   Physical Exam Vitals and nursing note reviewed.  Constitutional:      Appearance: He is well-developed.    HENT:     Head: Normocephalic and atraumatic.  Eyes:     Conjunctiva/sclera: Conjunctivae normal.  Cardiovascular:     Rate and Rhythm: Normal rate and regular rhythm.     Heart sounds: No murmur.  Pulmonary:     Effort: Pulmonary effort is normal. No respiratory distress.     Breath sounds: Normal breath sounds.  Abdominal:     Palpations: Abdomen is soft.     Tenderness: There is no abdominal tenderness.  Musculoskeletal:        General: No deformity or signs of injury. Normal range of motion.     Cervical back: Neck supple.  Skin:    General: Skin is warm and dry.     Capillary Refill: Capillary refill takes less than 2 seconds.  Neurological:     Mental Status: He is alert.     Comments: Patient is awake and alert following commands.  Right-sided facial droop.  Extraocular movements intact and visual fields intact.  Weakness right arm with some drift.  Weakness right leg.  No gross neglect.     ED Results / Procedures / Treatments   Labs (all labs ordered are listed, but only abnormal results are displayed) Labs Reviewed  CBC - Abnormal; Notable for the following components:      Result Value   WBC 16.2 (*)    RDW 16.6 (*)    All other components within normal limits  DIFFERENTIAL - Abnormal; Notable for the following components:   Neutro Abs 10.4 (*)    Monocytes Absolute 1.5 (*)    Abs Immature Granulocytes 0.47 (*)    All other components within normal limits  COMPREHENSIVE METABOLIC PANEL - Abnormal; Notable for the following components:   Glucose, Bld 137 (*)    All other components within normal limits  I-STAT CHEM 8, ED - Abnormal; Notable for the following components:   Glucose, Bld 132 (*)    All other components within normal limits  CBG MONITORING, ED - Abnormal; Notable for the following components:   Glucose-Capillary 145 (*)    All other components within normal limits  RESPIRATORY PANEL BY RT PCR (FLU A&B, COVID)  ETHANOL  PROTIME-INR  APTT   RAPID URINE DRUG SCREEN, HOSP PERFORMED  URINALYSIS, ROUTINE W REFLEX MICROSCOPIC    EKG EKG Interpretation  Date/Time:  Tuesday May 01 2019 07:04:43 EST Ventricular Rate:  59 PR Interval:    QRS Duration: 101 QT Interval:  427 QTC Calculation: 423 R Axis:   3 Text Interpretation: Sinus rhythm ST elev, probable normal early repol pattern No significant change since prior yesterday Confirmed by Meridee Score [phone]) on  05/01/2019 7:12:26 AM   Radiology CT Angio Head W or Wo Contrast  Result Date: 05/01/2019 CLINICAL DATA:  54 year old male code stroke presentation with right side weakness. EXAM: CT ANGIOGRAPHY HEAD AND NECK CT PERFUSION BRAIN TECHNIQUE: Multidetector CT imaging of the head and neck was performed using the standard protocol during bolus administration of intravenous contrast. Multiplanar CT image reconstructions and MIPs were obtained to evaluate the vascular anatomy. Carotid stenosis measurements (when applicable) are obtained utilizing NASCET criteria, using the distal internal carotid diameter as the denominator. Multiphase CT imaging of the brain was performed following IV bolus contrast injection. Subsequent parametric perfusion maps were calculated using RAPID software. CONTRAST:  OMNIPAQUE IOHEXOL 350 MG/ML SOLN COMPARISON:  Plain head CT 0658 hours today. FINDINGS: CT Brain Perfusion Findings: ASPECTS: 8/10 CBF (<30%) Volume: 34mL (underestimated) Perfusion (Tmax>6.0s) volume: 21mL - which appears to correspond to the area of left operculum cytotoxic edema by plain CT. Mismatch Volume: Unclear Infarction Location:Left operculum CTA NECK Skeleton: No acute osseous abnormality identified. Upper chest: No superior mediastinal lymphadenopathy. Negative visible upper lungs. Other neck: Negative for neck mass or lymphadenopathy. Aortic arch: Positive for a Type A Aortic Dissection, with intimal flap in the ascending aorta on series 5, image 184. the dissection continues  through the arch into the visible proximal descending aorta, and involves although great vessel origins. Right carotid system: Dissected brachiocephalic artery and subtotal thrombosed right CCA origin (series 5, image 155). Dissection and/or thrombus related RADIOGRAPHIC STRING SIGN stenosis of the right CCA at the level of the supraglottic larynx (series 5, image 119). However, the dissection then appears to terminates proximal to the right carotid bifurcation which appears widely patent. Some soft plaque suspected in the proximal right ICA, no definite right ICA dissection and no significant stenosis. Left carotid system: Dissection tracks through the origin of the left CCA and through the left carotid bifurcation into the left ICA (series 5, image 103). The flap then appears to terminates at a level just distal to the left ICA bulb. Overall moderate stenosis of the left CCA and ICA results. The left ICA appears fairly normal at the skull base. Vertebral arteries: The right subclavian artery origin is less affected by the dissection. Normal right vertebral artery origin. The right vertebral artery appears patent and normal to the skull base. The dissection tracks through the proximal left subclavian artery but appears to terminates proximal to the left vertebral artery origin. The left vertebral origin appears within normal limits. And the left vertebral artery appears patent and normal to the skull base. CTA HEAD Posterior circulation: Codominant distal vertebral arteries with no stenosis. Patent vertebrobasilar junction. Both a ICAs appear dominant. Patent basilar artery without stenosis. Patent SCA origins. Fetal type left PCA. Patent right PCA origin. Diminutive right posterior communicating artery. Bilateral PCA branches are within normal limits. Anterior circulation: Both ICA siphons are patent. On the left there is no significant plaque or stenosis with normal left ophthalmic and posterior communicating  artery origins. On the right there is mild ICA siphon plaque but no significant stenosis. Normal right ophthalmic and posterior communicating artery origins. Patent carotid termini. Anterior communicating artery and bilateral ACA branches are within normal limits. Right MCA M1 segment and bifurcation are patent without stenosis. Right MCA branches are within normal limits. Left MCA M1 segment and bifurcation are patent without stenosis. No left MCA M2 branch occlusion is identified. There is a subtle paucity of left anterior operculum branches, but no discrete branch occlusion can  be identified. Venous sinuses: Patent. Anatomic variants: Fetal type left PCA origin. Review of the MIP images confirms the above findings IMPRESSION: 1. Positive for a Stanford Type A Aortic Dissection, which severely involves both carotid arteries. The dissection extends extending farther into the Left Carotid (left ICA bulb) but results in a Radiographic-String-Sign stenosis of the Right CCA. Critical Value/emergent results were called by telephone to Dr. Meridee ScoreMICHAEL Celestino Ackerman on 05/01/2019 at 08:12 . 2. No superimposed intracranial emergent large vessel occlusion. 3. CT Brain Perfusion may be unreliable secondary to #1, but does not suggest ischemic penumbra outside of the left operculum core infarct visible by plain CT (ASPECTS 8). Electronically Signed   By: Odessa FlemingH  Hall M.D.   On: 05/01/2019 08:26   CT Angio Neck W and/or Wo Contrast  Result Date: 05/01/2019 CLINICAL DATA:  54 year old male code stroke presentation with right side weakness. EXAM: CT ANGIOGRAPHY HEAD AND NECK CT PERFUSION BRAIN TECHNIQUE: Multidetector CT imaging of the head and neck was performed using the standard protocol during bolus administration of intravenous contrast. Multiplanar CT image reconstructions and MIPs were obtained to evaluate the vascular anatomy. Carotid stenosis measurements (when applicable) are obtained utilizing NASCET criteria, using the distal  internal carotid diameter as the denominator. Multiphase CT imaging of the brain was performed following IV bolus contrast injection. Subsequent parametric perfusion maps were calculated using RAPID software. CONTRAST:  150mL OMNIPAQUE IOHEXOL 350 MG/ML SOLN COMPARISON:  Plain head CT 0658 hours today. FINDINGS: CT Brain Perfusion Findings: ASPECTS: 8/10 CBF (<30%) Volume: 0mL (underestimated) Perfusion (Tmax>6.0s) volume: 23mL - which appears to correspond to the area of left operculum cytotoxic edema by plain CT. Mismatch Volume: Unclear Infarction Location:Left operculum CTA NECK Skeleton: No acute osseous abnormality identified. Upper chest: No superior mediastinal lymphadenopathy. Negative visible upper lungs. Other neck: Negative for neck mass or lymphadenopathy. Aortic arch: Positive for a Type A Aortic Dissection, with intimal flap in the ascending aorta on series 5, image 184. the dissection continues through the arch into the visible proximal descending aorta, and involves although great vessel origins. Right carotid system: Dissected brachiocephalic artery and subtotal thrombosed right CCA origin (series 5, image 155). Dissection and/or thrombus related RADIOGRAPHIC STRING SIGN stenosis of the right CCA at the level of the supraglottic larynx (series 5, image 119). However, the dissection then appears to terminates proximal to the right carotid bifurcation which appears widely patent. Some soft plaque suspected in the proximal right ICA, no definite right ICA dissection and no significant stenosis. Left carotid system: Dissection tracks through the origin of the left CCA and through the left carotid bifurcation into the left ICA (series 5, image 103). The flap then appears to terminates at a level just distal to the left ICA bulb. Overall moderate stenosis of the left CCA and ICA results. The left ICA appears fairly normal at the skull base. Vertebral arteries: The right subclavian artery origin is less  affected by the dissection. Normal right vertebral artery origin. The right vertebral artery appears patent and normal to the skull base. The dissection tracks through the proximal left subclavian artery but appears to terminates proximal to the left vertebral artery origin. The left vertebral origin appears within normal limits. And the left vertebral artery appears patent and normal to the skull base. CTA HEAD Posterior circulation: Codominant distal vertebral arteries with no stenosis. Patent vertebrobasilar junction. Both a ICAs appear dominant. Patent basilar artery without stenosis. Patent SCA origins. Fetal type left PCA. Patent right PCA origin. Diminutive right  posterior communicating artery. Bilateral PCA branches are within normal limits. Anterior circulation: Both ICA siphons are patent. On the left there is no significant plaque or stenosis with normal left ophthalmic and posterior communicating artery origins. On the right there is mild ICA siphon plaque but no significant stenosis. Normal right ophthalmic and posterior communicating artery origins. Patent carotid termini. Anterior communicating artery and bilateral ACA branches are within normal limits. Right MCA M1 segment and bifurcation are patent without stenosis. Right MCA branches are within normal limits. Left MCA M1 segment and bifurcation are patent without stenosis. No left MCA M2 branch occlusion is identified. There is a subtle paucity of left anterior operculum branches, but no discrete branch occlusion can be identified. Venous sinuses: Patent. Anatomic variants: Fetal type left PCA origin. Review of the MIP images confirms the above findings IMPRESSION: 1. Positive for a Stanford Type A Aortic Dissection, which severely involves both carotid arteries. The dissection extends extending farther into the Left Carotid (left ICA bulb) but results in a Radiographic-String-Sign stenosis of the Right CCA. Critical Value/emergent results were  called by telephone to Dr. Meridee Score on 05/01/2019 at 08:12 . 2. No superimposed intracranial emergent large vessel occlusion. 3. CT Brain Perfusion may be unreliable secondary to #1, but does not suggest ischemic penumbra outside of the left operculum core infarct visible by plain CT (ASPECTS 8). Electronically Signed   By: Odessa Fleming M.D.   On: 05/01/2019 08:26   CT CEREBRAL PERFUSION W CONTRAST  Result Date: 05/01/2019 CLINICAL DATA:  54 year old male code stroke presentation with right side weakness. EXAM: CT ANGIOGRAPHY HEAD AND NECK CT PERFUSION BRAIN TECHNIQUE: Multidetector CT imaging of the head and neck was performed using the standard protocol during bolus administration of intravenous contrast. Multiplanar CT image reconstructions and MIPs were obtained to evaluate the vascular anatomy. Carotid stenosis measurements (when applicable) are obtained utilizing NASCET criteria, using the distal internal carotid diameter as the denominator. Multiphase CT imaging of the brain was performed following IV bolus contrast injection. Subsequent parametric perfusion maps were calculated using RAPID software. CONTRAST:  OMNIPAQUE IOHEXOL 350 MG/ML SOLN COMPARISON:  Plain head CT 0658 hours today. FINDINGS: CT Brain Perfusion Findings: ASPECTS: 8/10 CBF (<30%) Volume: 79mL (underestimated) Perfusion (Tmax>6.0s) volume: 53mL - which appears to correspond to the area of left operculum cytotoxic edema by plain CT. Mismatch Volume: Unclear Infarction Location:Left operculum CTA NECK Skeleton: No acute osseous abnormality identified. Upper chest: No superior mediastinal lymphadenopathy. Negative visible upper lungs. Other neck: Negative for neck mass or lymphadenopathy. Aortic arch: Positive for a Type A Aortic Dissection, with intimal flap in the ascending aorta on series 5, image 184. the dissection continues through the arch into the visible proximal descending aorta, and involves although great vessel origins.  Right carotid system: Dissected brachiocephalic artery and subtotal thrombosed right CCA origin (series 5, image 155). Dissection and/or thrombus related RADIOGRAPHIC STRING SIGN stenosis of the right CCA at the level of the supraglottic larynx (series 5, image 119). However, the dissection then appears to terminates proximal to the right carotid bifurcation which appears widely patent. Some soft plaque suspected in the proximal right ICA, no definite right ICA dissection and no significant stenosis. Left carotid system: Dissection tracks through the origin of the left CCA and through the left carotid bifurcation into the left ICA (series 5, image 103). The flap then appears to terminates at a level just distal to the left ICA bulb. Overall moderate stenosis of the left CCA and  ICA results. The left ICA appears fairly normal at the skull base. Vertebral arteries: The right subclavian artery origin is less affected by the dissection. Normal right vertebral artery origin. The right vertebral artery appears patent and normal to the skull base. The dissection tracks through the proximal left subclavian artery but appears to terminates proximal to the left vertebral artery origin. The left vertebral origin appears within normal limits. And the left vertebral artery appears patent and normal to the skull base. CTA HEAD Posterior circulation: Codominant distal vertebral arteries with no stenosis. Patent vertebrobasilar junction. Both a ICAs appear dominant. Patent basilar artery without stenosis. Patent SCA origins. Fetal type left PCA. Patent right PCA origin. Diminutive right posterior communicating artery. Bilateral PCA branches are within normal limits. Anterior circulation: Both ICA siphons are patent. On the left there is no significant plaque or stenosis with normal left ophthalmic and posterior communicating artery origins. On the right there is mild ICA siphon plaque but no significant stenosis. Normal right  ophthalmic and posterior communicating artery origins. Patent carotid termini. Anterior communicating artery and bilateral ACA branches are within normal limits. Right MCA M1 segment and bifurcation are patent without stenosis. Right MCA branches are within normal limits. Left MCA M1 segment and bifurcation are patent without stenosis. No left MCA M2 branch occlusion is identified. There is a subtle paucity of left anterior operculum branches, but no discrete branch occlusion can be identified. Venous sinuses: Patent. Anatomic variants: Fetal type left PCA origin. Review of the MIP images confirms the above findings IMPRESSION: 1. Positive for a Stanford Type A Aortic Dissection, which severely involves both carotid arteries. The dissection extends extending farther into the Left Carotid (left ICA bulb) but results in a Radiographic-String-Sign stenosis of the Right CCA. Critical Value/emergent results were called by telephone to Dr. Meridee ScoreMICHAEL Maximilliano Kersh on 05/01/2019 at 08:12 . 2. No superimposed intracranial emergent large vessel occlusion. 3. CT Brain Perfusion may be unreliable secondary to #1, but does not suggest ischemic penumbra outside of the left operculum core infarct visible by plain CT (ASPECTS 8). Electronically Signed   By: Odessa FlemingH  Hall M.D.   On: 05/01/2019 08:26   DG Chest Portable 1 View  Result Date: 04/30/2019 CLINICAL DATA:  Onset chest pain today. EXAM: PORTABLE CHEST 1 VIEW COMPARISON:  PA and lateral chest 05/07/2010. FINDINGS: Lungs clear. Heart size normal. No pneumothorax or pleural fluid. No acute or focal bony abnormality. IMPRESSION: Negative chest. Electronically Signed   By: Drusilla Kannerhomas  Dalessio M.D.   On: 04/30/2019 13:03   CT HEAD CODE STROKE WO CONTRAST  Result Date: 05/01/2019 CLINICAL DATA:  Code stroke.  Right-sided weakness and facial droop. EXAM: CT HEAD WITHOUT CONTRAST TECHNIQUE: Contiguous axial images were obtained from the base of the skull through the vertex without intravenous  contrast. COMPARISON:  None. FINDINGS: Brain: Focal loss of gray-white differentiation is present in the left frontal operculum both the level the basal ganglia and the super ganglionic level. The insular ribbon is intact. Basal ganglia are normal. No acute hemorrhage or mass lesion is present. Ventricles are normal. No significant extraaxial fluid collection is present. The brainstem and cerebellum are within normal limits. Vascular: No hyperdense vessel or unexpected calcification. Skull: Calvarium is intact. No focal lytic or blastic lesions are present. No significant extracranial soft tissue lesion is present. Sinuses/Orbits: The paranasal sinuses and mastoid air cells are clear. The globes and orbits are within normal limits. ASPECTS [facility]([place] Stroke Program Early CT Score) - Ganglionic level infarction (caudate,  lentiform nuclei, internal capsule, insula, M1-M3 cortex): 6/7 - Supraganglionic infarction (M4-M6 cortex): 2/3 Total score (0-10 with 10 being normal): 8/10 IMPRESSION: 1. Left frontal operculum acute nonhemorrhagic infarct. 2. ASPECTS is 8/10 3. CTA head and neck and CT perfusion would be useful for further evaluation potential for intervention in this patient with an acute infarct. These results were called by telephone at the time of interpretation on 05/01/2019 at 7:17 am to provider Dr. Dayna Barker, who verbally acknowledged these results. Electronically Signed   By: San Morelle M.D.   On: 05/01/2019 07:17    Procedures .Critical Care Performed by: Hayden Rasmussen, MD Authorized by: Hayden Rasmussen, MD   Critical care provider statement:    Critical care time (minutes):  80   Critical care time was exclusive of:  Separately billable procedures and treating other patients   Critical care was necessary to treat or prevent imminent or life-threatening deterioration of the following conditions:  CNS failure or compromise and circulatory failure   Critical care was time spent  personally by me on the following activities:  Discussions with consultants, evaluation of patient's response to treatment, examination of patient, ordering and performing treatments and interventions, ordering and review of laboratory studies, ordering and review of radiographic studies, pulse oximetry, re-evaluation of patient's condition, obtaining history from patient or surrogate, review of old charts and development of treatment plan with patient or surrogate   I assumed direction of critical care for this patient from another provider in my specialty: no     (including critical care time)  Medications Ordered in ED Medications - No data to display  ED Course  I have reviewed the triage vital signs and the nursing notes.  Pertinent labs & imaging results that were available during my care of the patient were reviewed by me and considered in my medical decision making (see chart for details).  Clinical Course as of Apr 30 1699  Tue May 01, 4358  715 54 year old male was here yesterday for chest pain here now with strokelike symptoms.  Probably outside window for TPA but could be considered a van candidate.  Differential includes dissection, bleed, ischemic stroke, embolic   [MB]  3419 Code stroke activation on arrival.  Initial CT noncontrast radiology called with results of the left frontal operculum infarct.  Teleneurology at bedside interviewing and examining patient.  Dr. Vickki Muff recommending CTA head and neck along with CT perfusion.  This is to see if patient has any large vessel occlusion and may benefit from interventional radiology.   [MB]  [id number] Labs showing a elevation of white blood cell count of 16.2. Glucose mildly elevated at 137. Otherwise unremarkable.   [MB]  [id number] Received stat call from radiology.  Patient has a type a dissection that goes up each carotid.  Emergently paged cardiothoracic surgery Dr. Darcey Nora who is accepting the patient to [facility] preop holding area.   I updated the patient on the new diagnosis.  Currently heart rate in the 50s blood pressure 120/47.  [facility] here beginning to package patient.   [MB]  [phone] Was able to update his wife Helene Kelp and gave her the current information and his transfer to [facility].   [MB]    Clinical Course User Index [MB] Hayden Rasmussen, MD   MDM Rules/Calculators/A&P                       Final Clinical Impression(s) / ED Diagnoses Final diagnoses:  Dissection of thoracic aorta (HCC)  Acute CVA (cerebrovascular accident) [facility])    Rx / DC Orders ED Discharge Orders    None       Terrilee Files, MD 05/01/19 1702

## 2019-05-01 NOTE — Op Note (Signed)
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 Lorayne Marek, MD  Assistant: Raford Pitcher, E PA-C  Anesthesia: get  Operative Findings:  normal biventricular systolic function pre/post bypass  Primary tear at the level of anterior STJ  Moderate-to-severe severe AI pre-repair; no AI after procedure    BRIEF CLINICAL NOTE AND INDICATIONS FOR SURGERY  54 yo man with h/o HTN presented yesterday with nonspecific chest pain and was discharged home. Returned early this am with aphasia, new onset. Evaluation demonstrated left brain CVA and dissection within ascending aorta on lower cuts of head/neck CT. Transferred urgently for repair of Stanford Type A dissection.   DETAILS OF THE OPERATIVE PROCEDURE  Preparation:  The patient is brought to the operating room on the above mentioned date and central monitoring was established by the anesthesia team including placement of Swan-Ganz catheter and bilateral radial arterial lines. The patient is placed in the supine position on the operating table.  Intravenous antibiotics are administered. General endotracheal anesthesia is induced uneventfully. A Foley catheter is placed.  Baseline transesophageal echocardiogram was performed.  Findings were notable for moderate to severe aortic valve insufficiency with a dissection flap near the STJ.   The patient's chest, abdomen, both groins, and both lower extremities are prepared and draped in a sterile manner. A time out procedure is  performed.   Surgical Approach:  An incision was made overlying the right deltopectoral groove. Dissection was carried down through the subcutaneous tissue with electrocautery. The pectoral muscles were divided. The right axillary artery was encountered. 5,000 units of unfractionated heparin were delivered centrally. A 10-mm Dacron graft was sutured end-to-side to the opened axillary artery with a running suture of 5-0 prolene. The graft was then de-aired and attached to the arterial limb of the cardiopulmonary bypass instrument. A  median sternotomy incision was performed. The chest was entered safely. The pericardium was opened and there was a moderate, serous effusion encountered. The ascending aorta is grossly abnormal in appearance and a palpable flap appreciated as well. Pericardial sutures are placed to create a well. Full dose heparin is delivered centrally. The right atrium is cannulated for cardiopulmonary bypass.  Adequate heparinization is verified.   The entire pre-bypass portion of the operation was notable for stable hemodynamics.  Cardiopulmonary bypass was begun and the surface of the heart is inspected.  A retrograde cardioplegia cannula is placed through the right atrium into the coronary sinus. The patient is  cooled  to goal of 22C bladder temperature.A left ventricular vent is placed through the right superior pulmonary vein. Soon after beginning to cool, the heart fibrillated as expected. The aortic cross clamp is gently applied and cold blood cardioplegia is delivered initially in an retrograde fashion as the ascending aorta is opened sharply. Coronary ostial cardioplegia is then given with Spencer cannulae such that myocardial arrest is achieved in short order.  Iced saline slush is applied for topical hypothermia.  Repeat doses of cardioplegia are administered intermittently throughout the entire cross clamp portion of the operation through the coronary sinus catheter in order to  maintain completely flat electrocardiogram. CO2 was blown into the field.   Replacement  of ascending aorta: The ascending aorta is resected to approximately 5 mm above the aortic commissures, taking care to leave enough margin above each coronary ostium for subsequent graft implantation. The aortic valve is trileaflet and normal in appearance. 4-0 prolene sutures are placed just above each aortic commissure. The aortic annulus is sized at approximately 25 mm, so a 28 mm straight dacron graft is selected for repair. Neo-media creation is performed between the left and right commissure due to extension of the false lumen in this area. The 28 mm Dacron graft is sutured end-to-end to the remnant ascending aorta with running 3-0 prolene. When this is completed, the patient had been cooling for one hour and the core temperature was 22 deg C.  Attention was then turned to the distal ascending repair. Circulatory arrest was undertaken after delivery of IV steroids and packing the head in ice.  The Innominate artery and left carotic artery were clamped as perfusion through the right axillary artery was resumed at approximately [phone] mL/min as antegrade cerebral perfusion. Blood was seen to vigorously return to the arch via the left subclavian artery. The remainder of the ascending aorta was resected to the level underneath the arch. A second tear was identified in the proximal descending aorta, approximately 4 cm distal to the left subclavian artery orifice. There was no evidence of flap within the orifice of the left carotid or innominate artery. A beveled piece of the 28 mm dacron graft was sewn to the underside of the arch with 3-0 prolene to create a hemi-arch reconstruction. Once completed, deairing of the arch was performed, and the aortic cross clamp was reapplied to the graft just proximal to the hemiarch anastomosis. Rewarming was undertaken after flow to the body was fully reestablished. Graft-to-graft  anastomosis was performed with running 4-0 prolene. A hot shot dose of cardioplegia was delivered antegrade and retrograde; deairing procedures were performed, and the aortic cross clamp was removed.    Procedure Completion:   Epicardial pacing wires are fixed to the right ventricular outflow tract and to the right atrial appendage. The patient is rewarmed to 36C temperature. The patient is weaned and disconnected from cardiopulmonary bypass.  The patient's rhythm at separation from bypass was sinus bradycardia.  The patient was weaned from cardiopulmonary bypass with low-dose inotropic support.  Followup transesophageal echocardiogram performed after separation from bypass revealed good biventricular function and no aortic valve insufficiency.  The axillary and venous cannula were removed uneventfully. Protamine was administered to reverse the anticoagulation. The mediastinum was inspected for hemostasis and irrigated with saline solution. The mediastinum was drained using 32 Fr fluted chest tubes placed through separate stab incisions inferiorly.  The soft tissues anterior to the aorta were reapproximated loosely. The sternum is closed in a rigid sternal manner with linear plating system and with double strength sternal wire. The soft tissues anterior to the sternum were closed in multiple layers and the skin is closed with a running subcuticular skin closure.  The post-bypass portion of the operation was notable for stable rhythm and hemodynamics.    Disposition:  The patient tolerated the procedure well and is transported to the surgical intensive care in stable condition. There are no intraoperative complications. All sponge instrument and needle counts are verified correct at completion of the operation.    Jayme Cloud, MD 05/01/2019 5:06 PM

## 2019-05-01 NOTE — Anesthesia Procedure Notes (Signed)
Central Venous Catheter Insertion Performed by: Cecile Hearing, MD, anesthesiologist Start/End3/04/2019 9:25 AM, 05/01/2019 9:30 AM Patient location: Pre-op. Emergency situation Preanesthetic checklist: patient identified, IV checked, site marked, risks and benefits discussed, surgical consent, monitors and equipment checked, pre-op evaluation and timeout performed Position: Trendelenburg Hand hygiene performed  and maximum sterile barriers used  Total catheter length 100. PA cath was placed.Swan type:thermodilution PA Cath depth:58 Procedure performed without using ultrasound guided technique. Attempts: 1 Patient tolerated the procedure well with no immediate complications.

## 2019-05-01 NOTE — Anesthesia Procedure Notes (Signed)
Arterial Line Insertion Start/End3/04/2019 9:25 AM, 05/01/2019 9:30 AM Performed by: Sheppard Evens, CRNA, CRNA  Patient location: Pre-op. Preanesthetic checklist: patient identified, IV checked, monitors and equipment checked and pre-op evaluation Lidocaine 1% used for infiltration and patient sedated Right, radial was placed Catheter size: 20 G Hand hygiene performed  and maximum sterile barriers used   Attempts: 1 Procedure performed without using ultrasound guided technique. Ultrasound Notes:anatomy identified Following insertion, Biopatch and dressing applied.

## 2019-05-01 NOTE — Transfer of Care (Signed)
Immediate Anesthesia Transfer of Care Note  Patient: Erik Parker  Procedure(s) Performed: REPAIR OF ACUTE ASCENDING THORACIC AORTIC DISSECTION USING HEMOSHIELD GRAFT SIZE (N/A Chest)  Patient Location: ICU  Anesthesia Type:General  Level of Consciousness: sedated and Patient remains intubated per anesthesia plan  Airway & Oxygen Therapy: Patient remains intubated per anesthesia plan and Patient placed on Ventilator (see vital sign flow sheet for setting)  Post-op Assessment: Report given to RN and Post -op Vital signs reviewed and stable  Post vital signs: Reviewed and stable  Last Vitals:  Vitals Value Taken Time  BP    Temp    Pulse    Resp    SpO2 99 % 05/01/19 1643    Last Pain:  Vitals:   05/01/19 0704  TempSrc:   PainSc: 0-No pain         Complications: No apparent anesthesia complications

## 2019-05-01 NOTE — Anesthesia Procedure Notes (Signed)
Arterial Line Insertion Start/End3/04/2019 9:15 AM, 11/19/2019 9:20 AM Performed by: Sheppard Evens, CRNA, CRNA  Patient location: Pre-op. Preanesthetic checklist: patient identified, IV checked, monitors and equipment checked and pre-op evaluation Lidocaine 1% used for infiltration and patient sedated Left, radial was placed Catheter size: 20 G Hand hygiene performed  and maximum sterile barriers used   Attempts: 1 Procedure performed without using ultrasound guided technique. Ultrasound Notes:anatomy identified Following insertion, Biopatch and dressing applied.

## 2019-05-02 ENCOUNTER — Inpatient Hospital Stay (HOSPITAL_COMMUNITY): Payer: 59

## 2019-05-02 ENCOUNTER — Encounter (HOSPITAL_COMMUNITY): Payer: Self-pay | Admitting: Cardiothoracic Surgery

## 2019-05-02 LAB — CBC
HCT: 33.8 % — ABNORMAL LOW (ref 39.0–52.0)
HCT: 34.4 % — ABNORMAL LOW (ref 39.0–52.0)
Hemoglobin: 10.9 g/dL — ABNORMAL LOW (ref 13.0–17.0)
Hemoglobin: 11.1 g/dL — ABNORMAL LOW (ref 13.0–17.0)
MCH: 28.5 pg (ref 26.0–34.0)
MCH: 28.2 pg (ref 26.0–34.0)
MCHC: 32.2 g/dL (ref 30.0–36.0)
MCHC: 32.3 g/dL (ref 30.0–36.0)
MCV: 88.5 fL (ref 80.0–100.0)
MCV: 87.5 fL (ref 80.0–100.0)
Platelets: 195 10*3/uL (ref 150–400)
Platelets: 221 10*3/uL (ref 150–400)
RBC: 3.82 MIL/uL — ABNORMAL LOW (ref 4.22–5.81)
RBC: 3.93 MIL/uL — ABNORMAL LOW (ref 4.22–5.81)
RDW: 16.6 % — ABNORMAL HIGH (ref 11.5–15.5)
RDW: 16.5 % — ABNORMAL HIGH (ref 11.5–15.5)
WBC: 16.3 10*3/uL — ABNORMAL HIGH (ref 4.0–10.5)
WBC: 18.9 10*3/uL — ABNORMAL HIGH (ref 4.0–10.5)
nRBC: 0 % (ref 0.0–0.2)
nRBC: 0 % (ref 0.0–0.2)

## 2019-05-02 LAB — POCT I-STAT 7, (LYTES, BLD GAS, ICA,H+H)
Acid-base deficit: 1 mmol/L (ref 0.0–2.0)
Acid-base deficit: 2 mmol/L (ref 0.0–2.0)
Acid-base deficit: 2 mmol/L (ref 0.0–2.0)
Bicarbonate: 21.4 mmol/L (ref 20.0–28.0)
Bicarbonate: 21.8 mmol/L (ref 20.0–28.0)
Bicarbonate: 22.1 mmol/L (ref 20.0–28.0)
Bicarbonate: 23.1 mmol/L (ref 20.0–28.0)
Calcium, Ion: 1.2 mmol/L (ref 1.15–1.40)
Calcium, Ion: 1.2 mmol/L (ref 1.15–1.40)
Calcium, Ion: 1.2 mmol/L (ref 1.15–1.40)
Calcium, Ion: 1.22 mmol/L (ref 1.15–1.40)
HCT: 33 % — ABNORMAL LOW (ref 39.0–52.0)
HCT: 33 % — ABNORMAL LOW (ref 39.0–52.0)
HCT: 34 % — ABNORMAL LOW (ref 39.0–52.0)
HCT: 35 % — ABNORMAL LOW (ref 39.0–52.0)
Hemoglobin: 11.2 g/dL — ABNORMAL LOW (ref 13.0–17.0)
Hemoglobin: 11.2 g/dL — ABNORMAL LOW (ref 13.0–17.0)
Hemoglobin: 11.6 g/dL — ABNORMAL LOW (ref 13.0–17.0)
Hemoglobin: 11.9 g/dL — ABNORMAL LOW (ref 13.0–17.0)
O2 Saturation: 89 %
O2 Saturation: 95 %
O2 Saturation: 96 %
O2 Saturation: 97 %
Patient temperature: 37.5
Patient temperature: 37.6
Patient temperature: 37.8
Patient temperature: 37.8
Potassium: 3.8 mmol/L (ref 3.5–5.1)
Potassium: 4 mmol/L (ref 3.5–5.1)
Potassium: 4.2 mmol/L (ref 3.5–5.1)
Potassium: 4.2 mmol/L (ref 3.5–5.1)
Sodium: 140 mmol/L (ref 135–145)
Sodium: 140 mmol/L (ref 135–145)
Sodium: 140 mmol/L (ref 135–145)
Sodium: 141 mmol/L (ref 135–145)
TCO2: 22 mmol/L (ref 22–32)
TCO2: 23 mmol/L (ref 22–32)
TCO2: 23 mmol/L (ref 22–32)
TCO2: 24 mmol/L (ref 22–32)
pCO2 arterial: 31.4 mmHg — ABNORMAL LOW (ref 32.0–48.0)
pCO2 arterial: 33.1 mmHg (ref 32.0–48.0)
pCO2 arterial: 34.1 mmHg (ref 32.0–48.0)
pCO2 arterial: 34.6 mmHg (ref 32.0–48.0)
pH, Arterial: 7.411 (ref 7.350–7.450)
pH, Arterial: 7.421 (ref 7.350–7.450)
pH, Arterial: 7.441 (ref 7.350–7.450)
pH, Arterial: 7.458 — ABNORMAL HIGH (ref 7.350–7.450)
pO2, Arterial: 55 mmHg — ABNORMAL LOW (ref 83.0–108.0)
pO2, Arterial: 73 mmHg — ABNORMAL LOW (ref 83.0–108.0)
pO2, Arterial: 79 mmHg — ABNORMAL LOW (ref 83.0–108.0)
pO2, Arterial: 97 mmHg (ref 83.0–108.0)

## 2019-05-02 LAB — PREPARE FRESH FROZEN PLASMA
Unit division: 0
Unit division: 0

## 2019-05-02 LAB — BASIC METABOLIC PANEL
Anion gap: 9 (ref 5–15)
Anion gap: 7 (ref 5–15)
BUN: 13 mg/dL (ref 6–20)
BUN: 10 mg/dL (ref 6–20)
CO2: 21 mmol/L — ABNORMAL LOW (ref 22–32)
CO2: 23 mmol/L (ref 22–32)
Calcium: 8.5 mg/dL — ABNORMAL LOW (ref 8.9–10.3)
Calcium: 8.4 mg/dL — ABNORMAL LOW (ref 8.9–10.3)
Chloride: 107 mmol/L (ref 98–111)
Chloride: 106 mmol/L (ref 98–111)
Creatinine, Ser: 0.71 mg/dL (ref 0.61–1.24)
Creatinine, Ser: 0.71 mg/dL (ref 0.61–1.24)
GFR calc Af Amer: >60 mL/min (ref >60)
GFR calc Af Amer: >60 mL/min (ref >60)
GFR calc non Af Amer: >60 mL/min (ref >60)
GFR calc non Af Amer: >60 mL/min (ref >60)
Glucose, Bld: 123 mg/dL — ABNORMAL HIGH (ref 70–99)
Glucose, Bld: 145 mg/dL — ABNORMAL HIGH (ref 70–99)
Potassium: 3.9 mmol/L (ref 3.5–5.1)
Potassium: 3.8 mmol/L (ref 3.5–5.1)
Sodium: 137 mmol/L (ref 135–145)
Sodium: 136 mmol/L (ref 135–145)

## 2019-05-02 LAB — BPAM PLATELET PHERESIS
Blood Product Expiration Date: 202103042359
ISSUE DATE / TIME: 202103021407
Unit Type and Rh: 6200

## 2019-05-02 LAB — PREPARE PLATELET PHERESIS: Unit division: 0

## 2019-05-02 LAB — BPAM FFP
Blood Product Expiration Date: 202103072359
Blood Product Expiration Date: 202103072359
ISSUE DATE / TIME: 202103021435
ISSUE DATE / TIME: 202103021440
Unit Type and Rh: 8400
Unit Type and Rh: 8400

## 2019-05-02 LAB — MRSA PCR SCREENING: MRSA by PCR: NEGATIVE

## 2019-05-02 LAB — SURGICAL PATHOLOGY

## 2019-05-02 LAB — GLUCOSE, CAPILLARY
Glucose-Capillary: 116 mg/dL — ABNORMAL HIGH (ref 70–99)
Glucose-Capillary: 118 mg/dL — ABNORMAL HIGH (ref 70–99)
Glucose-Capillary: 127 mg/dL — ABNORMAL HIGH (ref 70–99)
Glucose-Capillary: 129 mg/dL — ABNORMAL HIGH (ref 70–99)
Glucose-Capillary: 130 mg/dL — ABNORMAL HIGH (ref 70–99)
Glucose-Capillary: 131 mg/dL — ABNORMAL HIGH (ref 70–99)
Glucose-Capillary: 133 mg/dL — ABNORMAL HIGH (ref 70–99)
Glucose-Capillary: 134 mg/dL — ABNORMAL HIGH (ref 70–99)
Glucose-Capillary: 139 mg/dL — ABNORMAL HIGH (ref 70–99)
Glucose-Capillary: 149 mg/dL — ABNORMAL HIGH (ref 70–99)

## 2019-05-02 LAB — MAGNESIUM
Magnesium: 2.1 mg/dL (ref 1.7–2.4)
Magnesium: 2.1 mg/dL (ref 1.7–2.4)

## 2019-05-02 MED ORDER — PNEUMOCOCCAL VAC POLYVALENT 25 MCG/0.5ML IJ INJ: 0.5000 mL | INJECTION | INTRAMUSCULAR | Status: DC | PRN

## 2019-05-02 MED ORDER — ACETAMINOPHEN 10 MG/ML IV SOLN
1000.0000 mg | Freq: Four times a day (QID) | INTRAVENOUS | Status: AC
  Administered 2019-05-02 – 2019-05-03 (×4): 1000 mg via INTRAVENOUS
  Filled 2019-05-02 (×3): qty 100

## 2019-05-02 MED ORDER — SODIUM CHLORIDE 0.9% FLUSH: 10.0000 mL | INTRAVENOUS | Status: DC | PRN

## 2019-05-02 MED ORDER — ORAL CARE MOUTH RINSE
15.0000 mL | Freq: Two times a day (BID) | OROMUCOSAL | Status: DC
  Administered 2019-05-02 – 2019-05-05 (×8): 15 mL via OROMUCOSAL

## 2019-05-02 MED ORDER — SODIUM CHLORIDE 0.9% FLUSH
10.0000 mL | Freq: Two times a day (BID) | INTRAVENOUS | Status: DC
  Administered 2019-05-02 – 2019-05-03 (×3): 10 mL
  Administered 2019-05-03: 20 mL
  Administered 2019-05-04 – 2019-05-05 (×3): 10 mL

## 2019-05-02 MED ORDER — SODIUM CHLORIDE 0.9 % IV SOLN
INTRAVENOUS | Status: DC | PRN
  Administered 2019-05-02: 500 mL via INTRAVENOUS

## 2019-05-02 MED ORDER — CHLORHEXIDINE GLUCONATE 0.12 % MT SOLN
15.0000 mL | Freq: Two times a day (BID) | OROMUCOSAL | Status: DC
  Administered 2019-05-02 – 2019-05-05 (×8): 15 mL via OROMUCOSAL
  Filled 2019-05-02 (×6): qty 15

## 2019-05-02 MED ORDER — INFLUENZA VAC SPLIT QUAD 0.5 ML IM SUSY: 0.5000 mL | PREFILLED_SYRINGE | INTRAMUSCULAR | Status: DC | PRN

## 2019-05-02 MED ORDER — IOHEXOL 350 MG/ML SOLN
80.0000 mL | Freq: Once | INTRAVENOUS | Status: AC | PRN
  Administered 2019-05-02: 80 mL via INTRAVENOUS

## 2019-05-02 MED ORDER — PNEUMOCOCCAL VAC POLYVALENT 25 MCG/0.5ML IJ INJ: 0.5000 mL | INJECTION | INTRAMUSCULAR | Status: DC

## 2019-05-02 MED ORDER — INSULIN ASPART 100 UNIT/ML ~~LOC~~ SOLN
0.0000 [IU] | SUBCUTANEOUS | Status: DC
  Administered 2019-05-02 – 2019-05-04 (×8): 2 [IU] via SUBCUTANEOUS

## 2019-05-02 MED ORDER — ORAL CARE MOUTH RINSE
15.0000 mL | Freq: Two times a day (BID) | OROMUCOSAL | Status: DC
  Administered 2019-05-02 (×2): 15 mL via OROMUCOSAL

## 2019-05-02 NOTE — Evaluation (Signed)
Clinical/Bedside Swallow Evaluation Patient Details  Name: Erik Parker MRN: [id number] Date of Birth: 1965-09-21  Today's Date: 05/02/2019 Time: SLP Start Time (ACUTE ONLY): 1525 SLP Stop Time (ACUTE ONLY): 9892 SLP Time Calculation (min) (ACUTE ONLY): 39 min  Past Medical History:  Past Medical History:  Diagnosis Date  . Hyperlipidemia   . Hypertension    Past Surgical History:  Past Surgical History:  Procedure Laterality Date  . ACHILLES TENDON SURGERY Right 11/10/2016   Procedure: ACHILLES TENDON REPAIR VIA SPEED BRIDGE;  Surgeon: [name], DPM;  Location: [facility];  Service: Podiatry;  Laterality: Right;  . HEEL SPUR RESECTION Right 11/10/2016   Procedure: HEEL SPUR RESECTION (HAGLUNDS DEFORMITY);  Surgeon: [name], DPM;  Location: [facility];  Service: Podiatry;  Laterality: Right;   HPI:  Erik Parker is a 54 y.o. male with history of hypertension, hyperlipidemia.  Patient apparently was seen in the emergency department on 05/01/2019 for right facial droop and inability to speak along with right arm weakness. Currently presnts with expressive aphasia. No h/o swallowing difficulties.   Assessment / Plan / Recommendation Clinical Impression   Pt upright in bed for session. Pt's oral mech revealing reduced lingual ROM and weakness (CN XII). Pt observed with ice chips, thin liquids, and purees. Pt initially coughing prior to administering PO's, requiring use of suction. Following all PO trials, pt noted with multiple swallows and inconsistent immediate/delayed coughing. With single cup sips, pt noted to have more consistent immediate coughing. Given s/sx of aspiration, recommend proceeding with MBS, NPO with few ice chips following oral care and only necessary meds crushed in puree until study.   SLP Visit Diagnosis: Dysphagia, unspecified (R13.10)    Aspiration Risk  Mild aspiration risk;Moderate aspiration risk    Diet Recommendation NPO;Ice chips PRN after oral  care   Medication Administration: Other (Comment)(via alternative means; necessary meds crushed with puree) Postural Changes: Seated upright at 90 degrees    Other  Recommendations Oral Care Recommendations: Oral care BID;Oral care prior to ice chip/H20 Other Recommendations: Have oral suction available   Follow up Recommendations Other (comment)(TBA)      Frequency and Duration min 2x/week  2 weeks       Prognosis Prognosis for Safe Diet Advancement: Good      Swallow Study   General Date of Onset: 05/01/19 HPI: Erik Parker is a 54 y.o. male with history of hypertension, hyperlipidemia.  Patient apparently was seen in the emergency department on 05/01/2019 for right facial droop and inability to speak along with right arm weakness. Currently presnts with expressive aphasia. No h/o swallowing difficulties. Type of Study: Bedside Swallow Evaluation Previous Swallow Assessment: none found in chart Diet Prior to this Study: NPO Temperature Spikes Noted: No Respiratory Status: Nasal cannula History of Recent Intubation: Yes Length of Intubations (days): 1 days Date extubated: 05/01/19 Behavior/Cognition: Alert;Cooperative;Pleasant mood Oral Cavity Assessment: Within Functional Limits Oral Care Completed by SLP: No Oral Cavity - Dentition: Adequate natural dentition Vision: Functional for self-feeding Self-Feeding Abilities: Able to feed self Patient Positioning: Upright in bed Baseline Vocal Quality: Aphonic Volitional Swallow: Able to elicit    Oral/Motor/Sensory Function Overall Oral Motor/Sensory Function: Mild impairment Facial ROM: Reduced right Facial Symmetry: Within Functional Limits Facial Strength: Within Functional Limits Facial Sensation: Within Functional Limits Lingual ROM: Reduced right;Reduced left Lingual Symmetry: Within Functional Limits Lingual Strength: Reduced Lingual Sensation: Within Functional Limits Velum: Within Functional Limits Mandible:  Within Functional Limits   Ice Chips Ice chips:  Impaired Presentation: Spoon Pharyngeal Phase Impairments: Multiple swallows;Cough - Delayed   Thin Liquid Thin Liquid: Impaired Presentation: Cup;Self Fed;Spoon Pharyngeal  Phase Impairments: Multiple swallows;Cough - Delayed;Cough - Immediate    Nectar Thick Nectar Thick Liquid: Not tested   Honey Thick Honey Thick Liquid: Not tested   Puree Puree: Impaired Presentation: Spoon Pharyngeal Phase Impairments: Multiple swallows;Cough - Delayed   Solid     Solid: Not tested     Maudry Mayhew, Student SLP Office: [phone]  05/02/2019,4:23 PM

## 2019-05-02 NOTE — Progress Notes (Signed)
Chaplain engaged in initial visit with Erik Parker. Chaplain offered her support to Erik Parker.  Erik Parker was trying to rest, but allowed chaplain to explain spiritual care services she offers.    Chaplain will continue to follow-up.

## 2019-05-02 NOTE — Progress Notes (Signed)
Patient to have a modified barium study tomorrow, speech recommends waiting to place NG tube until after study is completed. Per speech we can give very small bites of applesauce with crushed meds. Dr. Vickey Sages informed of this and stated it was fine to hold off placing NG tube for now.  Leanna Battles, RN

## 2019-05-02 NOTE — Progress Notes (Signed)
Patient passed all weaning parameters NIF -25 VC 875

## 2019-05-02 NOTE — Procedures (Signed)
Extubation Procedure Note  Patient Details:   Name: Erik Parker DOB: May 17, 1965 MRN: [id number]   Airway Documentation:  Airway 8 mm (Active)  Secured at (cm) 23 cm 05/02/19 0000  Measured From Lips 05/02/19 0000  Secured Location Right 05/01/19 2350  Secured By Pink Tape 05/01/19 2350  Site Condition Dry 05/01/19 2016   Vent end date: 05/02/19 Vent end time: 0050   Evaluation  O2 sats: stable throughout Complications: No apparent complications Patient did tolerate procedure well. Bilateral Breath Sounds: Clear, Diminished   Yes  Pt does not speak loudly.  He is oriented to time and place and understands how to do incentive spirometer exercise.  Letitia Caul [facility] 05/02/2019, 12:50 AM

## 2019-05-02 NOTE — Anesthesia Postprocedure Evaluation (Signed)
Anesthesia Post Note  Patient: Erik Parker  Procedure(s) Performed: REPAIR OF ACUTE ASCENDING THORACIC AORTIC DISSECTION USING HEMOSHIELD GRAFT SIZE (N/A Chest)     Patient location during evaluation: SICU Anesthesia Type: General Level of consciousness: awake and alert, patient cooperative and oriented Pain management: pain level controlled Vital Signs Assessment: post-procedure vital signs reviewed and stable Respiratory status: spontaneous breathing, nonlabored ventilation, respiratory function stable and patient connected to nasal cannula oxygen Cardiovascular status: blood pressure returned to baseline and stable Postop Assessment: no apparent nausea or vomiting Anesthetic complications: no Comments: Pt still aphasic, but alert, aware, oriented, moving all extrem    Last Vitals:  Vitals:   05/02/19 0700 05/02/19 0715  BP: 111/71 123/66  Pulse: 81 82  Resp: (!) 21 (!) 25  Temp: 37.8 C 37.8 C  SpO2: 100% 100%    Last Pain:  Vitals:   05/02/19 0400  TempSrc: Core  PainSc:                  Jamonta Goerner,E. Tehilla Coffel

## 2019-05-02 NOTE — Progress Notes (Signed)
1 Day Post-Op Procedure(s) (LRB): REPAIR OF ACUTE ASCENDING THORACIC AORTIC DISSECTION USING HEMOSHIELD GRAFT SIZE (N/A) Subjective: Nods that he is not in pain  Objective: Vital signs in last 24 hours: Temp:  [94.3 F (34.6 C)-100 F (37.8 C)] 100 F (37.8 C) (03/03 0715) Pulse Rate:  [69-116] 82 (03/03 0715) Cardiac Rhythm: Normal sinus rhythm (03/03 0400) Resp:  [10-31] 25 (03/03 0715) BP: (86-132)/(59-89) 123/66 (03/03 0715) SpO2:  [1 %-100 %] 100 % (03/03 0715) Arterial Line BP: (88-171)/(45-104) 144/64 (03/03 0715) FiO2 (%):  [5 %-50 %] 40 % (03/03 0000) Weight:  [117.1 kg] 117.1 kg (03/03 0500)  Hemodynamic parameters for last 24 hours: PAP: (13-37)/(1-19) 14/5 CO:  [5 L/min-7.3 L/min] 7.3 L/min CI:  [2.1 L/min/m2-3.1 L/min/m2] 3.1 L/min/m2  Intake/Output from previous day: 03/02 0701 - 03/03 0700 In: 2706 [I.V.:3903.8; Blood:2077; IV Piggyback:1852.2] Out: 5361 [Urine:2550; Blood:2131; Chest Tube:680] Intake/Output this shift: No intake/output data recorded.  General appearance: alert, cooperative and no distress Neurologic: left facial droop Heart: regular rate and rhythm, S1, S2 normal, no murmur, click, rub or gallop Lungs: clear to auscultation bilaterally Abdomen: soft, non-tender; bowel sounds normal; no masses,  no organomegaly Extremities: edema mild edema; motor intact Wound: dressed, dry  Lab Results: Recent Labs    05/01/19 2229 05/02/19 0032 05/02/19 0405 05/02/19 0405 05/02/19 0409 05/02/19 0706  WBC 18.3*  --  16.3*  --   --   --   HGB 11.7*   < > 10.9*   < > 11.2* 11.6*  HCT 35.9*   < > 33.8*   < > 33.0* 34.0*  PLT 211  --  195  --   --   --    < > = values in this interval not displayed.   BMET:  Recent Labs    05/01/19 2229 05/02/19 0032 05/02/19 0405 05/02/19 0405 05/02/19 0409 05/02/19 0706  NA 139   < > 137   < > 140 140  K 4.5   < > 3.9   < > 4.0 3.8  CL 111  --  107  --   --   --   CO2 20*  --  21*  --   --   --    GLUCOSE 156*  --  123*  --   --   --   BUN 17  --  13  --   --   --   CREATININE 0.76  --  0.71  --   --   --   CALCIUM 8.5*  --  8.5*  --   --   --    < > = values in this interval not displayed.    PT/INR:  Recent Labs    05/01/19 1716  LABPROT 15.6*  INR 1.3*   ABG    Component Value Date/Time   PHART 7.441 05/02/2019 0706   HCO3 23.1 05/02/2019 0706   TCO2 24 05/02/2019 0706   ACIDBASEDEF 1.0 05/02/2019 0409   O2SAT 96.0 05/02/2019 0706   CBG (last 3)  Recent Labs    05/02/19 0302 05/02/19 0501 05/02/19 0656  GLUCAP 118* 131* 130*    Assessment/Plan: S/P Procedure(s) (LRB): REPAIR OF ACUTE ASCENDING THORACIC AORTIC DISSECTION USING HEMOSHIELD GRAFT SIZE (N/A) Mobilize d/c pacing wires neurology consult   [employer] SLP oob to chair after PA catheter removed   LOS: 1 day    Linden Dolin 05/02/2019

## 2019-05-02 NOTE — Plan of Care (Signed)
  Problem: Clinical Measurements: Goal: Ability to maintain clinical measurements within normal limits will improve Outcome: Progressing Goal: Respiratory complications will improve Outcome: Progressing Goal: Cardiovascular complication will be avoided Outcome: Progressing   Problem: Activity: Goal: Risk for activity intolerance will decrease Outcome: Progressing   Problem: Pain Managment: Goal: General experience of comfort will improve Outcome: Progressing   Problem: Coping: Goal: Will identify appropriate support needs Outcome: Progressing   Problem: Nutrition: Goal: Adequate nutrition will be maintained Outcome: Not Progressing   Problem: Self-Care: Goal: Ability to communicate needs accurately will improve Outcome: Not Progressing

## 2019-05-02 NOTE — Evaluation (Signed)
Speech Language Pathology Evaluation Patient Details Name: Erik Parker MRN: [id number] DOB: April 21, 1965 Today's Date: 05/02/2019 Time: 0932-3557 SLP Time Calculation (min) (ACUTE ONLY): 39 min  Problem List:  Patient Active Problem List   Diagnosis Date Noted  . Aortic dissection ([place]) 05/01/2019  . 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 Bilateral hand numbness 02/26/2019  . Decreased grip strength 02/26/2019   Past Medical History:  Past Medical History:  Diagnosis Date  . Hyperlipidemia   . Hypertension    Past Surgical History:  Past Surgical History:  Procedure Laterality Date  . ACHILLES TENDON SURGERY Right 11/10/2016   Procedure: ACHILLES TENDON REPAIR VIA SPEED BRIDGE;  Surgeon: [name], DPM;  Location: [facility];  Service: Podiatry;  Laterality: Right;  . HEEL SPUR RESECTION Right 11/10/2016   Procedure: HEEL SPUR RESECTION (HAGLUNDS DEFORMITY);  Surgeon: [name], DPM;  Location: [facility];  Service: Podiatry;  Laterality: Right;   HPI:  Erik Parker is a 54 y.o. male with history of hypertension, hyperlipidemia.  Patient apparently was seen in the emergency department on 05/01/2019 for right facial droop and inability to speak along with right arm weakness. Currently presnts with expressive aphasia. No h/o swallowing difficulties.   Assessment / Plan / Recommendation Clinical Impression  Pt presents with significantly impaired expressive language but with much more intact receptive language. He follows commands and answers yes/no questions well and can read at least up to the sentence level with accurate comprehension. He cannot read outloud or generate written words. When given the first letter of his first and last name he was able to generate both. He was not able to finish other words in this manner but could copy them. Pt did not produce any clear words, but did seem to approximate his articulators when trying to count and he repeated a few  sounds. Pt will benefit from additional SLP f/u to maximize functional communication.    SLP Assessment  SLP Recommendation/Assessment: Patient needs continued Speech Lanaguage Pathology Services SLP Visit Diagnosis: Aphasia (R47.01)    Follow Up Recommendations  Inpatient Rehab    Frequency and Duration min 2x/week  2 weeks      SLP Evaluation Cognition  Overall Cognitive Status: Difficult to assess(but appears functional)       Comprehension  Auditory Comprehension Overall Auditory Comprehension: Appears within functional limits for tasks assessed Visual Recognition/Discrimination Discrimination: Within Function Limits Reading Comprehension Reading Status: Within funtional limits    Expression Expression Primary Mode of Expression: Verbal Verbal Expression Overall Verbal Expression: Impaired Initiation: Impaired Level of Generative/Spontaneous Verbalization: (sounds) Repetition: Impaired Level of Impairment: Word level Pragmatics: No impairment Interfering Components: Speech intelligibility Non-Verbal Means of Communication: Writing;Communication board Written Expression Written Expression: Exceptions to [facility] Copy Ability: Word Self Formulation Ability: (name) Effective Techniques: Tactile/Visual cues   Oral / Motor  Oral Motor/Sensory Function Overall Oral Motor/Sensory Function: Mild impairment Facial ROM: Reduced right;Suspected CN VII (facial) dysfunction Facial Symmetry: Abnormal symmetry right;Suspected CN VII (facial) dysfunction Facial Strength: Reduced right;Suspected CN VII (facial) dysfunction Facial Sensation: Within Functional Limits Lingual ROM: Reduced right;Reduced left Lingual Symmetry: Within Functional Limits Lingual Strength: Reduced Lingual Sensation: Within Functional Limits Velum: Within Functional Limits Mandible: Within Functional Limits Motor Speech Overall Motor Speech: Impaired Phonation: Normal Articulation: Impaired Level of  Impairment: Word   GO                     Osie Bond., M.A. [place] Acute Environmental education officer [phone] Office (  [phone]  05/02/2019, 5:43 PM

## 2019-05-02 NOTE — Consult Note (Signed)
Neurology Consultation  Reason for Consult: Stroke Referring Physician: Linden Dolin, MD  CC: Stroke and expressive aphasia  History is obtained from: Chart  HPI: Erik Parker is a 54 y.o. male with history of hypertension, hyperlipidemia.  Patient apparently was seen in the emergency department on 05/01/2019 for right facial droop and inability to speak along with right arm weakness.  Per teleneurology's notes patient was last seen normal at 6 PM on 3 /03/2019 at that time teleneurology was called.  CTA head showed a left frontal operculum acute nonhemorrhagic infarct. At that time patient scored a 7 on the NIH stroke scale.    While stroke work-up was being done patient was noted to also have a dissection within the a sending aorta on the lower cuts of the head/neck CT.  Patient was transferred immediately for urgent repair of Stanford type a dissection.  After surgery and patient was stable.  Neurology was consulted for patient's stroke symptoms.  At current time patient is able to understand my commands but has expressive aphasia.  Due to the stroke neurology was consulted today.    ED course  CT head shows-left frontal operculum acute nonhemorrhagic infarct CT angio of head and neck-has a for Stanford type a aortic dissection, which severely involves both carotid arteries.  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  LKW: 6 PM on 3 /03/2019. tpa given?: no, patient was out of window for TPA Premorbid modified Rankin scale (mRS): 0 NIH stroke score: 10   Past Medical  History:  Diagnosis Date  . Hyperlipidemia   . Hypertension     Family History  Problem Relation Age of Onset  . Hypertension Mother   . Hypertension Father    Social History:   reports that he has been smoking cigarettes. He has a 4.50 pack-year smoking history. He has never used smokeless tobacco. He reports current alcohol use. He reports that he does not use drugs.  Medications  Current Facility-Administered Medications:  .  0.45 % sodium chloride infusion, , Intravenous, Continuous PRN, Barrett, Erin R, PA-C, Stopped at 05/02/19 0829 .  0.9 %  sodium chloride infusion (Manually program via Guardrails IV Fluids), , Intravenous, Once, Waynard Edwards, CRNA .  0.9 %  sodium chloride infusion, 250 mL, Intravenous, Continuous, Barrett, Erin R, PA-C .  0.9 %  sodium chloride infusion, , Intravenous, Continuous, Barrett, Erin R, PA-C .  0.9 %  sodium chloride infusion, , Intravenous, PRN, Linden Dolin, MD, Stopped at 05/02/19 0957 .  acetaminophen (TYLENOL) tablet 1,000 mg, 1,000 mg, Oral, Q6H **OR** acetaminophen (TYLENOL) 160 MG/5ML solution 1,000 mg, 1,000 mg, Per Tube, Q6H, Barrett, Erin R, PA-C, 1,000 mg at 05/01/19 2357 .  acetaminophen (TYLENOL) 160 MG/5ML solution 650 mg, 650 mg, Per Tube, Once **OR** acetaminophen (TYLENOL) suppository 650 mg, 650 mg, Rectal, Once, Barrett, Erin R, PA-C .  aspirin EC tablet 325 mg, 325 mg, Oral, Daily **OR** aspirin chewable tablet 324 mg, 324 mg, Per Tube, Daily, Barrett, Erin R, PA-C .  bisacodyl (DULCOLAX) EC tablet 10 mg, 10 mg, Oral, Daily **OR** bisacodyl (DULCOLAX) suppository 10  mg, 10 mg, Rectal, Daily, Barrett, Erin R, PA-C .  cefUROXime (ZINACEF) 1.5 g in sodium chloride 0.9 % 100 mL IVPB, 1.5 g, Intravenous, Q12H, Barrett, Erin R, PA-C, Stopped at 05/01/19 2140 .  Chlorhexidine Gluconate Cloth 2 % PADS 6 each, 6 each, Topical, Daily, Linden Dolin, MD, 6 each at 05/01/19 2209 .  dextrose 50 % solution 0-50 mL, 0-50 mL,  Intravenous, PRN, Barrett, Erin R, PA-C .  docusate sodium (COLACE) capsule 200 mg, 200 mg, Oral, Daily, Barrett, Erin R, PA-C .  famotidine (PEPCID) IVPB 20 mg premix, 20 mg, Intravenous, Q12H, Barrett, Erin R, PA-C, Last Rate: 100 mL/hr at 05/02/19 1000, Rate Verify at 05/02/19 1000 .  folic acid (FOLVITE) tablet 1 mg, 1 mg, Oral, Daily, Barrett, Erin R, PA-C .  CBG monitoring, , , Q4H **AND** insulin aspart (novoLOG) injection 0-24 Units, 0-24 Units, Subcutaneous, Q4H, Atkins, Broadus Z, MD .  insulin regular, human (MYXREDLIN) 100 units/ 100 mL infusion, , Intravenous, Continuous, Barrett, Erin R, PA-C, Last Rate: 1.2 mL/hr at 05/02/19 1000, Rate Verify at 05/02/19 1000 .  lactated ringers infusion, , Intravenous, Continuous, Barrett, Erin R, PA-C .  lactated ringers infusion, , Intravenous, Continuous, Barrett, Erin R, PA-C .  MEDLINE mouth rinse, 15 mL, Mouth Rinse, BID, Atkins, Broadus Z, MD, 15 mL at 05/02/19 1000 .  metoCLOPramide (REGLAN) injection 10 mg, 10 mg, Intravenous, Q6H, Barrett, Erin R, PA-C, 10 mg at 05/02/19 0508 .  metoprolol tartrate (LOPRESSOR) injection 2.5-5 mg, 2.5-5 mg, Intravenous, Q2H PRN, Barrett, Erin R, PA-C .  morphine 2 MG/ML injection 1-4 mg, 1-4 mg, Intravenous, Q1H PRN, Barrett, Erin R, PA-C, 2 mg at 05/02/19 0843 .  nicardipine (CARDENE) 20mg  in 0.86% saline IV infusion (0.1 mg/ml), 3-15 mg/hr, Intravenous, Continuous, Atkins, Broadus Z, MD .  nitroGLYCERIN 50 mg in dextrose 5 % 250 mL (0.2 mg/mL) infusion, 0-100 mcg/min, Intravenous, Titrated, Barrett, Erin R, PA-C, Last Rate: 4.5 mL/hr at 05/02/19 1000, 15 mcg/min at 05/02/19 1000 .  ondansetron (ZOFRAN) injection 4 mg, 4 mg, Intravenous, Q6H PRN, Barrett, Erin R, PA-C .  oxyCODONE (Oxy IR/ROXICODONE) immediate release tablet 5-10 mg, 5-10 mg, Oral, Q3H PRN, Barrett, Erin R, PA-C .  [START ON 05/03/2019] pantoprazole (PROTONIX) EC tablet 40 mg, 40 mg, Oral, Daily, Barrett, Erin R, PA-C .  phenylephrine  (NEOSYNEPHRINE) 20-0.9 MG/250ML-% infusion, 0-100 mcg/min, Intravenous, Titrated, Barrett, Erin R, PA-C, Stopped at 05/02/19 0435 .  pravastatin (PRAVACHOL) tablet 40 mg, 40 mg, Oral, q1800, Barrett, Erin R, PA-C .  propofol (DIPRIVAN) 1000 MG/100ML infusion, 5-80 mcg/kg/min, Intravenous, Titrated, 07/02/19, Vickey Sages, MD, Stopped at 05/01/19 2203 .  sodium chloride flush (NS) 0.9 % injection 10-40 mL, 10-40 mL, Intracatheter, Q12H, Atkins, 08-21-1994, MD, 10 mL at 05/02/19 0958 .  sodium chloride flush (NS) 0.9 % injection 10-40 mL, 10-40 mL, Intracatheter, PRN, Atkins, Broadus Z, MD .  sodium chloride flush (NS) 0.9 % injection 3 mL, 3 mL, Intravenous, Q12H, Barrett, Erin R, PA-C, 3 mL at 05/02/19 0958 .  sodium chloride flush (NS) 0.9 % injection 3 mL, 3 mL, Intravenous, PRN, Barrett, Erin R, PA-C .  traMADol (ULTRAM) tablet 50-100 mg, 50-100 mg, Oral, Q4H PRN, Barrett, Erin R, PA-C  ROS:   Unable to obtain due to patient is aphasic  Exam: Current vital signs: BP 135/76   Pulse 87   Temp 99.9 F (37.7 C)   Resp (!) 29   Ht 6' (1.829 m)   Wt 117.1 kg  SpO2 100%   BMI 35.01 kg/m  Vital signs in last 24 hours: Temp:  [94.3 F (34.6 C)-100 F (37.8 C)] 99.9 F (37.7 C) (03/03 0830) Pulse Rate:  [69-116] 87 (03/03 1000) Resp:  [10-31] 29 (03/03 1000) BP: (86-135)/(59-89) 135/76 (03/03 1000) SpO2:  [1 %-100 %] 100 % (03/03 1000) Arterial Line BP: (88-171)/(45-104) 158/64 (03/03 1000) FiO2 (%):  [5 %-50 %] 40 % (03/03 0000) Weight:  [117.1 kg] 117.1 kg (03/03 0500)   Constitutional: Appears well-developed and well-nourished.  Eyes: No scleral injection HENT: No OP obstrucion Head: Normocephalic.  Cardiovascular: Normal rate and regular rhythm.  Respiratory: Effort normal, non-labored breathing GI: Soft.  No distension. There is no tenderness.  Skin: WDI  Neuro: Mental Status: Patient is awake, alert, able to follow visual and verbal simple comman Patient is able to follow  visual and verbal commands-patient is densely expressively aphasic  Cranial Nerves: II: No blink to threat on the right III,IV, VI: EOMI without ptosis or diploplia. Pupils equal, round and reactive to light V: Facial sensation is symmetric to temperature VII: Right facial droop VIII: hearing is intact to voice X: Palat elevates symmetrically XI: Shoulder shrug is symmetric. XII: tongue is midline without atrophy or fasciculations.  Motor: 5/5 strength on left arm and leg.  4/5 strength on right arm and leg.  No drift noted on bilateral arms or legs Sensory: Sensation is symmetric to light touch and temperature in the arms and legs. DSS Deep Tendon Reflexes: 2+ and symmetric in the biceps and patellae.  Plantars: Toes are downgoing bilaterally.  Cerebellar: FNF and HKS are intact bilaterally  Labs I have reviewed labs in epic and the results pertinent to this consultation are:  CBC    Component Value Date/Time   WBC 16.3 (H) 05/02/2019 0405   RBC 3.82 (L) 05/02/2019 0405   HGB 11.6 (L) 05/02/2019 0706   HCT 34.0 (L) 05/02/2019 0706   PLT 195 05/02/2019 0405   MCV 88.5 05/02/2019 0405   MCH 28.5 05/02/2019 0405   MCHC 32.2 05/02/2019 0405   RDW 16.6 (H) 05/02/2019 0405   LYMPHSABS 3.8 05/01/2019 0700   MONOABS 1.5 (H) 05/01/2019 0700   EOSABS 0.0 05/01/2019 0700   BASOSABS 0.1 05/01/2019 0700    CMP     Component Value Date/Time   NA 140 05/02/2019 0706   NA 140 02/26/2019 1107   K 3.8 05/02/2019 0706   CL 107 05/02/2019 0405   CO2 21 (L) 05/02/2019 0405   GLUCOSE 123 (H) 05/02/2019 0405   BUN 13 05/02/2019 0405   BUN 16 02/26/2019 1107   CREATININE 0.71 05/02/2019 0405   CALCIUM 8.5 (L) 05/02/2019 0405   PROT 6.9 05/01/2019 0700   PROT 7.8 02/26/2019 1107   ALBUMIN 3.9 05/01/2019 0700   ALBUMIN 4.8 02/26/2019 1107   AST 15 05/01/2019 0700   ALT 27 05/01/2019 0700   ALKPHOS 47 05/01/2019 0700   BILITOT 0.9 05/01/2019 0700   BILITOT 0.3 02/26/2019 1107    GFRNONAA >60 05/02/2019 0405   GFRAA >60 05/02/2019 0405    Lipid Panel     Component Value Date/Time   TRIG 101 05/01/2019 2229     Imaging I have reviewed the images obtained:  CT head shows-left frontal operculum acute nonhemorrhagic infarct CT angio of head and neck-has a for Stanford type A aortic dissection, which severely involves both carotid arteries.  The dissection extends farther into the left carotid ICA bulb but results in a  radiographic string sign stenosis of the right CCA CT perfusion-CT brain perfusion may be unreliable secondary to dissection, but does not suggest ischemic penumbra outside of the left operculum core infarct   Felicie Morn PA-C [facility] [phone]  M-F  (9:00 am- 5:00 PM)  05/02/2019, 10:13 AM     Assessment:  This is a 54 year old male with history of hypertension hyperlipidemia.  Initially came to the ED secondary to right-sided weakness and expressive aphasia.  During stroke work-up noted to have a aortic dissection which required immediate intervention.  Last known normal at 6 PM on 04/30/2019.  Patient would not of been a TPA candidate nor a interventional radiology candidate at that time even without aortic dissection.  Exam shows right hemianopsia, 4/5 strength on the right and dense expressive aphasia.  Impression: -Type a aortic dissection which required emergent surgery -Left frontal operculum acute nonhemorrhagic infarct -Expressive aphasia  Recommend -MRI of the brain without contrast-once pacing wires are removed -repeat CT angiogram of the head and neck, if CTA shows dissection flap we will consider nonbolus heparin drip -Continue aspirin 325 mg daily  -Start or continue Atorvastatin 80 mg/other high intensity statin -BP goal: Per cardiothoracic surgery -HBAIC and Lipid profile -Telemetry monitoring -Frequent neuro checks -NPO until passes stroke swallow screen -PT/OT and speech therapy  # please page  stroke NP  Or  PA  Or MD from 8am -4 pm  as this patient from this time will be  followed by the stroke.   You can look them up on [email or web address]  Password [email or web address]  NEUROHOSPITALIST ADDENDUM Performed a face to face diagnostic evaluation.   I have reviewed the contents of history and physical exam as documented by PA/ARNP/Resident and agree with above documentation.  I have discussed and formulated the above plan as documented. Edits to the note have been made as needed.  54 year old male with history of hypertension hyperlipidemia presented to [facility] emergency department around 7 AM after waking up with speech changes progressive weakness.  Was complaining of chest pain the previous day and seen in the ER.  He went to bed at 6 PM woke up with symptoms.  He was evaluated by tele neurology-stat CT head showed a left frontal lobe infarct, CT angiogram was positive for type B aortic dissection in the ascending aorta switch involves both carotid arteries. CT perfusion did not show significant area of salvageable penumbra.  Cardiothoracic surgery was consulted and patient went for emergent surgery and transfer to [facility].  Patient extubated post OR, is awake and alert, sitting in chair, he can follow commands but remains nonverbal.  Significant expressive aphasia.  Minimal weakness in the right arm and the right leg, right facial droop.  Plan Repeat CT angiogram of the head and neck-make sure there is no dissection flap/thrombus.  If so may consider heparin drip. Continue aspirin for now, will need statin Will need PT/OT and speech Obtain A1c and lipid profile  Stroke team to follow  Georgiana Spinner  MD [facility] [phone]   If 7pm to 7am, please call on call as listed on AMION.

## 2019-05-02 NOTE — Progress Notes (Signed)
Pt placed on 4 lpm N/C tolerating well.

## 2019-05-02 NOTE — Progress Notes (Signed)
Patient scheduled to go to MRI at 1400. Around 1500, RN received a call from radiology stated they needed a KUB to verify patient did not have any metal in his abdomen. KUB ordered. MRI and CT angio will be obtained once cleared by radiology and he's able to be placed on the schedule.  Leanna Battles, RN

## 2019-05-02 NOTE — Progress Notes (Signed)
      [address]       [place] [address]             [phone]      POD # 1 Repair aortic dissection  Sleeping.   Expressive aphasia unchanged  MAP around 85, SBP 135 per cuff, 163 per A line  On 30 mcg of nitroglycerin  K= 3.8, creatinine 0.7, Hct 34  Continue present care Permissive hypertension Await MR  Viviann Spare C. Dorris Fetch, MD [facility] [phone]

## 2019-05-03 ENCOUNTER — Inpatient Hospital Stay (HOSPITAL_COMMUNITY): Payer: 59

## 2019-05-03 ENCOUNTER — Encounter: Payer: Self-pay | Admitting: *Deleted

## 2019-05-03 DIAGNOSIS — I7101 Dissection of thoracic aorta: Principal | ICD-10-CM

## 2019-05-03 DIAGNOSIS — Z95828 Presence of other vascular implants and grafts: Secondary | ICD-10-CM

## 2019-05-03 DIAGNOSIS — I6340 Cerebral infarction due to embolism of unspecified cerebral artery: Secondary | ICD-10-CM | POA: Insufficient documentation

## 2019-05-03 DIAGNOSIS — I472 Ventricular tachycardia: Secondary | ICD-10-CM

## 2019-05-03 LAB — ECHOCARDIOGRAM LIMITED
Height: 72 in
Weight: 4014.14 oz

## 2019-05-03 LAB — ECHO INTRAOPERATIVE TEE
Height: 72 in
Weight: 4056.46 oz

## 2019-05-03 LAB — CBC
HCT: 33.6 % — ABNORMAL LOW (ref 39.0–52.0)
Hemoglobin: 11.1 g/dL — ABNORMAL LOW (ref 13.0–17.0)
MCH: 28.9 pg (ref 26.0–34.0)
MCHC: 33 g/dL (ref 30.0–36.0)
MCV: 87.5 fL (ref 80.0–100.0)
Platelets: 197 10*3/uL (ref 150–400)
RBC: 3.84 MIL/uL — ABNORMAL LOW (ref 4.22–5.81)
RDW: 16.4 % — ABNORMAL HIGH (ref 11.5–15.5)
WBC: 16.3 10*3/uL — ABNORMAL HIGH (ref 4.0–10.5)
nRBC: 0 % (ref 0.0–0.2)

## 2019-05-03 LAB — BASIC METABOLIC PANEL
Anion gap: 7 (ref 5–15)
Anion gap: 10 (ref 5–15)
BUN: 7 mg/dL (ref 6–20)
BUN: 9 mg/dL (ref 6–20)
CO2: 25 mmol/L (ref 22–32)
CO2: 22 mmol/L (ref 22–32)
Calcium: 8.6 mg/dL — ABNORMAL LOW (ref 8.9–10.3)
Calcium: 8.6 mg/dL — ABNORMAL LOW (ref 8.9–10.3)
Chloride: 106 mmol/L (ref 98–111)
Chloride: 105 mmol/L (ref 98–111)
Creatinine, Ser: 0.59 mg/dL — ABNORMAL LOW (ref 0.61–1.24)
Creatinine, Ser: 0.73 mg/dL (ref 0.61–1.24)
GFR calc Af Amer: >60 mL/min (ref >60)
GFR calc Af Amer: >60 mL/min (ref >60)
GFR calc non Af Amer: >60 mL/min (ref >60)
GFR calc non Af Amer: >60 mL/min (ref >60)
Glucose, Bld: 135 mg/dL — ABNORMAL HIGH (ref 70–99)
Glucose, Bld: 132 mg/dL — ABNORMAL HIGH (ref 70–99)
Potassium: 3.7 mmol/L (ref 3.5–5.1)
Potassium: 3.7 mmol/L (ref 3.5–5.1)
Sodium: 138 mmol/L (ref 135–145)
Sodium: 137 mmol/L (ref 135–145)

## 2019-05-03 LAB — GLUCOSE, CAPILLARY
Glucose-Capillary: 106 mg/dL — ABNORMAL HIGH (ref 70–99)
Glucose-Capillary: 110 mg/dL — ABNORMAL HIGH (ref 70–99)
Glucose-Capillary: 123 mg/dL — ABNORMAL HIGH (ref 70–99)
Glucose-Capillary: 124 mg/dL — ABNORMAL HIGH (ref 70–99)
Glucose-Capillary: 125 mg/dL — ABNORMAL HIGH (ref 70–99)
Glucose-Capillary: 143 mg/dL — ABNORMAL HIGH (ref 70–99)

## 2019-05-03 LAB — TROPONIN I (HIGH SENSITIVITY)
Troponin I (High Sensitivity): 1949 ng/L (ref <18)
Troponin I (High Sensitivity): 1784 ng/L (ref <18)

## 2019-05-03 MED ORDER — POTASSIUM CHLORIDE 10 MEQ/50ML IV SOLN
10.0000 meq | INTRAVENOUS | Status: AC
  Administered 2019-05-03 (×3): 10 meq via INTRAVENOUS
  Filled 2019-05-03 (×3): qty 50

## 2019-05-03 MED ORDER — MAGNESIUM SULFATE 2 GM/50ML IV SOLN
2.0000 g | Freq: Once | INTRAVENOUS | Status: AC
  Administered 2019-05-03: 2 g via INTRAVENOUS
  Filled 2019-05-03: qty 50

## 2019-05-03 MED ORDER — FUROSEMIDE 10 MG/ML IJ SOLN
40.0000 mg | Freq: Once | INTRAMUSCULAR | Status: AC
  Administered 2019-05-03: 40 mg via INTRAVENOUS
  Filled 2019-05-03: qty 4

## 2019-05-03 MED ORDER — COLCHICINE 0.6 MG PO TABS
0.6000 mg | ORAL_TABLET | Freq: Every day | ORAL | Status: DC
  Administered 2019-05-03 – 2019-05-04 (×2): 0.6 mg via ORAL
  Filled 2019-05-03 (×2): qty 1

## 2019-05-03 MED ORDER — KETOROLAC TROMETHAMINE 15 MG/ML IJ SOLN
15.0000 mg | Freq: Four times a day (QID) | INTRAMUSCULAR | Status: AC
  Administered 2019-05-03 – 2019-05-04 (×5): 15 mg via INTRAVENOUS
  Filled 2019-05-03 (×5): qty 1

## 2019-05-03 MED ORDER — METOPROLOL TARTRATE 5 MG/5ML IV SOLN
5.0000 mg | Freq: Four times a day (QID) | INTRAVENOUS | Status: DC
  Administered 2019-05-03 – 2019-05-05 (×7): 5 mg via INTRAVENOUS
  Filled 2019-05-03 (×7): qty 5

## 2019-05-03 MED ORDER — PANTOPRAZOLE SODIUM 40 MG IV SOLR
40.0000 mg | INTRAVENOUS | Status: DC
  Administered 2019-05-03 – 2019-05-09 (×7): 40 mg via INTRAVENOUS
  Filled 2019-05-03 (×7): qty 40

## 2019-05-03 NOTE — Consult Note (Signed)
Physical Medicine and Rehabilitation Consult Reason for Consult: Decreased functional ability with expressive aphasia Referring Physician: Dr. Julien Girt   HPI: Erik Parker is a 54 y.o. right-handed male with history of hypertension, hyperlipidemia, as well as tobacco abuse.  History taken from chart review due to expressive aphasia.  He presented on 05/01/2019 with chest pain.  Work-up was unremarkable and he was discharged home. Patient again returned with right facial droop and inability to speak with right arm weakness.  CTA head showed a left frontal operculum acute nonhemorrhagic infarct.  Upon further work-up, he was found to have aortic dissection and underwent repair on 05/01/2019 per Dr. Orvan Seen.  MRI of the brain personally reviewed, showing left > right embolic infarcts.  Currently on aspirin for CVA prophylaxis.  Complicated by dysphagia, placed on dysphagia #1 nectar thick liquid diet.  Therapy evaluations pending MD has requested physical medicine rehab consult.  Review of Systems  Unable to perform ROS: Patient nonverbal   Past Medical History:  Diagnosis Date   Hyperlipidemia    Hypertension    Past Surgical History:  Procedure Laterality Date   ACHILLES TENDON SURGERY Right 11/10/2016   Procedure: ACHILLES TENDON REPAIR VIA SPEED BRIDGE;  Surgeon: [name], DPM;  Location: [facility];  Service: Podiatry;  Laterality: Right;   HEEL SPUR RESECTION Right 11/10/2016   Procedure: HEEL SPUR RESECTION (HAGLUNDS DEFORMITY);  Surgeon: [name], DPM;  Location: [facility];  Service: Podiatry;  Laterality: Right;   REPAIR OF ACUTE ASCENDING THORACIC AORTIC DISSECTION N/A 05/01/2019   Procedure: REPAIR OF ACUTE ASCENDING THORACIC AORTIC DISSECTION USING HEMOSHIELD GRAFT SIZE 28MM;  Surgeon: Wonda Olds, MD;  Location: [facility];  Service: Cardiothoracic;  Laterality: N/A;   Family History  Problem Relation Age of Onset   Hypertension Mother    Hypertension Father     Social History:  reports that he has been smoking cigarettes. He has a 4.50 pack-year smoking history. He has never used smokeless tobacco. He reports current alcohol use. He reports that he does not use drugs. Allergies: No Known Allergies Medications Prior to Admission  Medication Sig Dispense Refill   amLODipine (NORVASC) 10 MG tablet Take 10 mg by mouth daily.  0   aspirin EC 81 MG tablet Take 81 mg by mouth daily.     famotidine (PEPCID) 20 MG tablet Take 1 tablet (20 mg total) by mouth 2 (two) times daily. 30 tablet 0   folic acid (FOLVITE) 1 MG tablet Take 1 mg by mouth daily.     hydrochlorothiazide (HYDRODIURIL) 25 MG tablet Take 25 mg by mouth daily.     lovastatin (MEVACOR) 40 MG tablet Take 20 mg by mouth at bedtime.      Methotrexate Sodium (METHOTREXATE, PF,) 50 MG/2ML injection Inject 0.8 mg into the muscle once a week.      predniSONE (DELTASONE) 20 MG tablet Take 20 mg by mouth daily with breakfast.     sucralfate (CARAFATE) 1 g tablet Take 1 tablet (1 g total) by mouth 4 (four) times daily -  with meals and at bedtime. 21 tablet 0    Home: Home Living Family/patient expects to be discharged to:: Private residence Living Arrangements: Spouse/significant other  Functional History:   Functional Status:  Mobility:          ADL:    Cognition: Cognition Overall Cognitive Status: Difficult to assess(but appears functional) Orientation Level: Oriented X4 Cognition Overall Cognitive Status: Difficult to assess(but appears functional)  Blood pressure 116/79, pulse 82, temperature 98.1 F (36.7 C), temperature source Axillary, resp. rate 19, height 6' (1.829 m), weight 113.8 kg, SpO2 97 %. Physical Exam  Vitals reviewed. Constitutional: He appears well-developed.  Obese  HENT:  Head: Normocephalic and atraumatic.  Eyes: Right eye exhibits no discharge. Left eye exhibits no discharge. No scleral icterus.  Neck: No tracheal deviation present. No  thyromegaly present.  Respiratory: Effort normal. No respiratory distress.  GI: He exhibits no distension.  Musculoskeletal:     Comments: No edema or tenderness in extremities  Neurological: He is alert.  Expressive aphasia Motor: Bilateral upper extremities proximally 1+/5, distally 4/5 (?positioning) Bilateral lower extremities: 4/5 proximal distal Sensation appears intact to light touch  Skin: Skin is warm and dry.  Psychiatric:  Unable to assess due to expressive aphasia    Results for orders placed or performed during the hospital encounter of 05/01/19 (from the past 24 hour(s))  Glucose, capillary     Status: Abnormal   Collection Time: 05/03/19  8:39 AM  Result Value Ref Range   Glucose-Capillary 124 (H) 70 - 99 mg/dL  Glucose, capillary     Status: Abnormal   Collection Time: 05/03/19 11:15 AM  Result Value Ref Range   Glucose-Capillary 143 (H) 70 - 99 mg/dL  Glucose, capillary     Status: Abnormal   Collection Time: 05/03/19  3:49 PM  Result Value Ref Range   Glucose-Capillary 110 (H) 70 - 99 mg/dL  Basic metabolic panel     Status: Abnormal   Collection Time: 05/03/19  6:00 PM  Result Value Ref Range   Sodium 137 135 - 145 mmol/L   Potassium 3.7 3.5 - 5.1 mmol/L   Chloride 105 98 - 111 mmol/L   CO2 22 22 - 32 mmol/L   Glucose, Bld 132 (H) 70 - 99 mg/dL   BUN 9 6 - 20 mg/dL   Creatinine, Ser 9.09 0.61 - 1.24 mg/dL   Calcium 8.6 (L) 8.9 - 10.3 mg/dL   GFR calc non Af Amer >60 >60 mL/min   GFR calc Af Amer >60 >60 mL/min   Anion gap 10 5 - 15  Troponin I (High Sensitivity)     Status: Abnormal   Collection Time: 05/03/19  6:00 PM  Result Value Ref Range   Troponin I (High Sensitivity) 1,949 (HH) <18 ng/L  Glucose, capillary     Status: Abnormal   Collection Time: 05/03/19  9:03 PM  Result Value Ref Range   Glucose-Capillary 106 (H) 70 - 99 mg/dL  Troponin I (High Sensitivity)     Status: Abnormal   Collection Time: 05/03/19  9:20 PM  Result Value Ref Range     Troponin I (High Sensitivity) 1,784 (HH) <18 ng/L  Glucose, capillary     Status: Abnormal   Collection Time: 05/04/19 12:08 AM  Result Value Ref Range   Glucose-Capillary 124 (H) 70 - 99 mg/dL  Glucose, capillary     Status: Abnormal   Collection Time: 05/04/19  5:05 AM  Result Value Ref Range   Glucose-Capillary 102 (H) 70 - 99 mg/dL   CT ANGIO HEAD W OR WO CONTRAST  Result Date: 05/03/2019 CLINICAL DATA:  Multifocal stroke.  Vascular dissection. EXAM: CT ANGIOGRAPHY HEAD AND NECK TECHNIQUE: Multidetector CT imaging of the head and neck was performed using the standard protocol during bolus administration of intravenous contrast. Multiplanar CT image reconstructions and MIPs were obtained to evaluate the vascular anatomy. Carotid stenosis measurements (when applicable) are  obtained utilizing NASCET criteria, using the distal internal carotid diameter as the denominator. CONTRAST:  80mL OMNIPAQUE IOHEXOL 350 MG/ML SOLN COMPARISON:  MRI same day.  CT studies yesterday. FINDINGS: CTA NECK FINDINGS Aortic arch: The patient has had interval surgical repair of the aorta. Based on the start of the scan, dissection flap remains visible at the arch and proximal descending aorta. The ascending aorta is not included on this scan. As seen previously, the dissection involves also the innominate artery and left common carotid artery as well as the proximal left subclavian artery. Right carotid system: Dissection extension into the right common carotid artery appears the same, ending proximal to the bifurcation. The true lumen is larger than was seen yesterday. Dissection does not extend into the internal carotid artery. Left carotid system: As seen yesterday, the dissection extends throughout the left common carotid artery. The true lumen is mm or 2 larger than was seen yesterday. The dissection extends into the internal carotid artery as high as the C2 level but does not extend intracranial. Vertebral arteries:  Both vertebral arteries perfuse normally. Skeleton: Ordinary spondylosis. Other neck: Otherwise negative Upper chest: Postoperative changes as expected. Review of the MIP images confirms the above findings CTA HEAD FINDINGS Anterior circulation: Both internal carotid arteries are patent through the siphon regions. The anterior and middle cerebral vessels show flow. No proximal stenosis. No discernible large vessel occlusion presently. Some luxury perfusion in the region of the left operculum ir infarction. Posterior circulation: Both vertebral arteries widely patent to the basilar. No basilar stenosis. Posterior circulation branch vessels are normal. Primary fetal origin of both posterior cerebral arteries. Venous sinuses: Patent and normal. Anatomic variants: None other significant. Review of the MIP images confirms the above findings IMPRESSION: Interval aortic repair, not primarily or completely evaluated. Dissection again extensions into both carotid systems but does not appear to have propagated any further. In fact, true luminal size of the common carotid regions is improved. No intracranial large vessel occlusion demonstrable presently. Small region of luxury perfusion related to the left frontal operculum infarction. Electronically Signed   By: Paulina Fusi M.D.   On: 05/03/2019 00:15   DG Chest 1 View  Result Date: 05/03/2019 CLINICAL DATA:  CABG 2 days ago. EXAM: CHEST  1 VIEW COMPARISON:  One-view chest x-ray 05/02/2019 FINDINGS: The heart is enlarged, exaggerated by low lung volumes. Swan-Ganz catheter was removed. Mediastinal drains remain in place. Right-sided chest tube scratched at left-sided chest tube is stable. Mild bibasilar atelectasis is improving. Surgical clips are present at the right axilla. IMPRESSION: 1. Improving bibasilar atelectasis. 2. Interval removal of Swan-Ganz catheter. Electronically Signed   By: Marin Roberts M.D.   On: 05/03/2019 06:52   CT ANGIO NECK W OR WO  CONTRAST  Result Date: 05/03/2019 CLINICAL DATA:  Multifocal stroke.  Vascular dissection. EXAM: CT ANGIOGRAPHY HEAD AND NECK TECHNIQUE: Multidetector CT imaging of the head and neck was performed using the standard protocol during bolus administration of intravenous contrast. Multiplanar CT image reconstructions and MIPs were obtained to evaluate the vascular anatomy. Carotid stenosis measurements (when applicable) are obtained utilizing NASCET criteria, using the distal internal carotid diameter as the denominator. CONTRAST:  80mL OMNIPAQUE IOHEXOL 350 MG/ML SOLN COMPARISON:  MRI same day.  CT studies yesterday. FINDINGS: CTA NECK FINDINGS Aortic arch: The patient has had interval surgical repair of the aorta. Based on the start of the scan, dissection flap remains visible at the arch and proximal descending aorta. The ascending aorta is  not included on this scan. As seen previously, the dissection involves also the innominate artery and left common carotid artery as well as the proximal left subclavian artery. Right carotid system: Dissection extension into the right common carotid artery appears the same, ending proximal to the bifurcation. The true lumen is larger than was seen yesterday. Dissection does not extend into the internal carotid artery. Left carotid system: As seen yesterday, the dissection extends throughout the left common carotid artery. The true lumen is mm or 2 larger than was seen yesterday. The dissection extends into the internal carotid artery as high as the C2 level but does not extend intracranial. Vertebral arteries: Both vertebral arteries perfuse normally. Skeleton: Ordinary spondylosis. Other neck: Otherwise negative Upper chest: Postoperative changes as expected. Review of the MIP images confirms the above findings CTA HEAD FINDINGS Anterior circulation: Both internal carotid arteries are patent through the siphon regions. The anterior and middle cerebral vessels show flow. No  proximal stenosis. No discernible large vessel occlusion presently. Some luxury perfusion in the region of the left operculum ir infarction. Posterior circulation: Both vertebral arteries widely patent to the basilar. No basilar stenosis. Posterior circulation branch vessels are normal. Primary fetal origin of both posterior cerebral arteries. Venous sinuses: Patent and normal. Anatomic variants: None other significant. Review of the MIP images confirms the above findings IMPRESSION: Interval aortic repair, not primarily or completely evaluated. Dissection again extensions into both carotid systems but does not appear to have propagated any further. In fact, true luminal size of the common carotid regions is improved. No intracranial large vessel occlusion demonstrable presently. Small region of luxury perfusion related to the left frontal operculum infarction. Electronically Signed   By: Paulina Fusi M.D.   On: 05/03/2019 00:15   MR BRAIN WO CONTRAST  Result Date: 05/02/2019 CLINICAL DATA:  Right facial droop and speech disturbance with right arm weakness yesterday. Follow-up. Vascular dissection. Left frontal stroke. EXAM: MRI HEAD WITHOUT CONTRAST TECHNIQUE: Multiplanar, multiecho pulse sequences of the brain and surrounding structures were obtained without intravenous contrast. COMPARISON:  CT studies done yesterday. FINDINGS: Brain: Diffusion imaging shows a 3.5-4 cm region of acute infarction in the left frontal operculum as previously seen. There is additionally a 2 cm acute infarction at the left parietooccipital junction, a punctate acute infarction in the left occipital lobe, and a subcentimeter acute infarction in the right frontal lobe. 1 cm cortical infarction at the left posterior frontal vertex, probably the precentral gyrus. Few other punctate foci in the left hemisphere. No evidence of mass effect or hematoma. Minimal petechial blood products in the largest left frontal operculum stroke.  Elsewhere, the brain appears normal. No pre-existing vascular insults. No hydrocephalus or extra-axial collection. Vascular: Major vessels at the base of the brain show flow. Skull and upper cervical spine: Negative Sinuses/Orbits: Sinuses are clear. Tiny left mastoid effusion. Orbits negative. Other: None IMPRESSION: Scattered areas of acute infarction in both hemispheres, more extensive on the left than the right, consistent with embolic infarctions. The largest area of involvement is a 3.5-4 cm stroke in the left frontal operculum, with mild swelling and minimal petechial blood products. Scattered other strokes in the left hemisphere, next largest measuring about 2 cm at the left parietooccipital junction. Single acute infarction in the right hemisphere measuring less than a cm in size in the right frontal lobe. No brainstem or cerebellar stroke. Electronically Signed   By: Paulina Fusi M.D.   On: 05/02/2019 22:22   DG Chest Port 1  View  Result Date: 05/02/2019 CLINICAL DATA:  54 year old male status post ascending aortic replacement. EXAM: PORTABLE CHEST 1 VIEW COMPARISON:  Chest x-ray 05/01/2019. FINDINGS: Patient has been extubated. Mediastinal/pericardial drains are noted. Right IJ Cordis through which a Swan-Ganz catheter has been passed into a descending branch of the pulmonary artery in the right lower lobe. Low lung volumes. Bibasilar opacities which likely reflect mild postoperative subsegmental atelectasis. No pleural effusions. No evidence of pulmonary edema. No pneumothorax. Cardiopericardial silhouette is mildly enlarged, stable compared to the prior study. Upper mediastinal contours are within normal limits. Status post median sternotomy. Surgical clips project over the right axillary region. IMPRESSION: 1. Postoperative changes and support apparatus, as above. 2. Low lung volumes with probable bibasilar subsegmental atelectasis. Electronically Signed   By: Trudie Reedaniel  Entrikin M.D.   On: 05/02/2019  09:48   DG Abd Portable 1V  Result Date: 05/02/2019 CLINICAL DATA:  Screening for metal prior to MRI EXAM: PORTABLE ABDOMEN - 1 VIEW COMPARISON:  None. FINDINGS: Mild gaseous distension of bowel in the left abdomen, both large and small bowel, favor focal ileus. No radiopaque foreign bodies. No organomegaly or free air. IMPRESSION: No metallic/radiopaque foreign body. Electronically Signed   By: Charlett NoseKevin  Dover M.D.   On: 05/02/2019 16:27   DG Swallowing Func-Speech Pathology  Result Date: 05/03/2019 Objective Swallowing Evaluation: Type of Study: MBS-Modified Barium Swallow Study  Patient Details Name: Leonides Grillsony V Swilling MRN: [id number] Date of Birth: [birth year] Today's Date: 05/03/2019 Time: SLP Start Time (ACUTE ONLY): 1040 -SLP Stop Time (ACUTE ONLY): 1100 SLP Time Calculation (min) (ACUTE ONLY): 20 min Past Medical History: Past Medical History: Diagnosis Date  Hyperlipidemia   Hypertension  Past Surgical History: Past Surgical History: Procedure Laterality Date  ACHILLES TENDON SURGERY Right 11/10/2016  Procedure: ACHILLES TENDON REPAIR VIA SPEED BRIDGE;  Surgeon: Erskine EmeryPatel, Prayashkumar, DPM;  Location: [facility];  Service: Podiatry;  Laterality: Right;  HEEL SPUR RESECTION Right 11/10/2016  Procedure: HEEL SPUR RESECTION (HAGLUNDS DEFORMITY);  Surgeon: Erskine EmeryPatel, Prayashkumar, DPM;  Location: [facility];  Service: Podiatry;  Laterality: Right;  REPAIR OF ACUTE ASCENDING THORACIC AORTIC DISSECTION N/A 05/01/2019  Procedure: REPAIR OF ACUTE ASCENDING THORACIC AORTIC DISSECTION USING HEMOSHIELD GRAFT SIZE 28MM;  Surgeon: Linden DolinAtkins, Broadus Z, MD;  Location[facility] OR;  Service: Cardiothoracic;  Laterality: N/A; HPI: Leonides Grillsony V Sturgill is a 54 y.o. male with history of hypertension, hyperlipidemia.  Patient apparently was seen in the emergency department on 05/01/2019 for right facial droop and inability to speak along with right arm weakness. Currently presnts with expressive aphasia. No h/o swallowing difficulties.  Subjective: alert,  cooperative Assessment / Plan / Recommendation CHL IP CLINICAL IMPRESSIONS 05/03/2019 Clinical Impression Pt was seen in radiology suite for modified barium swallow study. Trials of puree solids, dysphagia 2 solids, regular texture solids, thin liquids, and nectar thick liquids via cup and straw were administered. Pt presents with moderate oropharyngeal dysphagia characterized by impaired bolus control, prolonged mastication, a pharyngeal delay, and reduced anterior laryngeal movement. Bolus manipulation was reduced during mastication and mastication was inadequately thorough. He demonstrated premature spillage to the level of valleculae and pyriform sinuses. The swallow was often triggered with the head of the bolus at the level of the pyriform sinuses with thin liquids and at the level of the valleculae with other consistencies. Aspiration (PAS 7) was noted with thin liquids after the swallow and was likely due to residue in the pyriform sinuses. However, this cannot be conclusively stated since the aspiration event occurred between swallows  when the fluoro was off. Coughing was noted throughout the study even in the absence of laryngeal invasion but coughing was ineffective in expelling the aspirant. A puree diet with nectar thick liquids may be started at this time with strict observance of swallowing precautions. However, considering the level of fatigue that he demonstrated and endorsed having at the end of the study, his endurance during meals is questioned and supplemental non-oral nutrition may therefore be needed. SLP will follow to assess diet tolerance and for treatment.  SLP Visit Diagnosis Dysphagia, unspecified (R13.10) Attention and concentration deficit following -- Frontal lobe and executive function deficit following -- Impact on safety and function Mild aspiration risk;Moderate aspiration risk   CHL IP TREATMENT RECOMMENDATION 05/03/2019 Treatment Recommendations Therapy as outlined in treatment plan  below   Prognosis 05/03/2019 Prognosis for Safe Diet Advancement Good Barriers to Reach Goals Language deficits;Severity of deficits Barriers/Prognosis Comment -- CHL IP DIET RECOMMENDATION 05/03/2019 SLP Diet Recommendations Dysphagia 1 (Puree) solids;Nectar thick liquid Liquid Administration via Cup;Straw Medication Administration Crushed with puree Compensations Slow rate;Small sips/bites Postural Changes Remain semi-upright after after feeds/meals (Comment);Seated upright at 90 degrees   CHL IP OTHER RECOMMENDATIONS 05/03/2019 Recommended Consults -- Oral Care Recommendations Oral care BID;Oral care before and after PO Other Recommendations Have oral suction available   CHL IP FOLLOW UP RECOMMENDATIONS 05/03/2019 Follow up Recommendations (No Data)   CHL IP FREQUENCY AND DURATION 05/03/2019 Speech Therapy Frequency (ACUTE ONLY) min 2x/week Treatment Duration 2 weeks      CHL IP ORAL PHASE 05/03/2019 Oral Phase Impaired Oral - Pudding Teaspoon -- Oral - Pudding Cup -- Oral - Honey Teaspoon -- Oral - Honey Cup -- Oral - Nectar Teaspoon -- Oral - Nectar Cup Premature spillage;Decreased bolus cohesion;Delayed oral transit Oral - Nectar Straw Premature spillage;Decreased bolus cohesion;Delayed oral transit Oral - Thin Teaspoon -- Oral - Thin Cup Premature spillage;Decreased bolus cohesion;Delayed oral transit Oral - Thin Straw -- Oral - Puree Decreased bolus cohesion;Delayed oral transit;Piecemeal swallowing Oral - Mech Soft Impaired mastication;Delayed oral transit;Weak lingual manipulation Oral - Regular Impaired mastication;Decreased bolus cohesion Oral - Multi-Consistency -- Oral - Pill -- Oral Phase - Comment --  CHL IP PHARYNGEAL PHASE 05/03/2019 Pharyngeal Phase Impaired Pharyngeal- Pudding Teaspoon -- Pharyngeal -- Pharyngeal- Pudding Cup -- Pharyngeal -- Pharyngeal- Honey Teaspoon -- Pharyngeal -- Pharyngeal- Honey Cup -- Pharyngeal -- Pharyngeal- Nectar Teaspoon -- Pharyngeal -- Pharyngeal- Nectar Cup -- Pharyngeal --  Pharyngeal- Nectar Straw Delayed swallow initiation-vallecula;Pharyngeal residue - pyriform Pharyngeal -- Pharyngeal- Thin Teaspoon -- Pharyngeal -- Pharyngeal- Thin Cup Delayed swallow initiation-pyriform sinuses;Reduced anterior laryngeal mobility Pharyngeal -- Pharyngeal- Thin Straw Delayed swallow initiation-pyriform sinuses;Penetration/Apiration after swallow;Reduced anterior laryngeal mobility Pharyngeal Material enters airway, passes BELOW cords and not ejected out despite cough attempt by patient Pharyngeal- Puree Reduced anterior laryngeal mobility Pharyngeal -- Pharyngeal- Mechanical Soft Reduced anterior laryngeal mobility Pharyngeal -- Pharyngeal- Regular -- Pharyngeal -- Pharyngeal- Multi-consistency -- Pharyngeal -- Pharyngeal- Pill -- Pharyngeal -- Pharyngeal Comment --  CHL IP CERVICAL ESOPHAGEAL PHASE 05/03/2019 Cervical Esophageal Phase WFL Pudding Teaspoon -- Pudding Cup -- Honey Teaspoon -- Honey Cup -- Nectar Teaspoon -- Nectar Cup -- Nectar Straw -- Thin Teaspoon -- Thin Cup -- Thin Straw -- Puree -- Mechanical Soft -- Regular -- Multi-consistency -- Pill -- Cervical Esophageal Comment -- Shanika I. Vear Clock, MS, CCC-SLP Acute Rehabilitation Services Office number [phone] Pager [phone] Scheryl Marten 05/03/2019, 12:23 PM              ECHOCARDIOGRAM LIMITED  Result Date: 05/03/2019    ECHOCARDIOGRAM LIMITED REPORT   Patient Name:   OAK DOREY Date of Exam: 05/03/2019 Medical Rec #:  [id number]    Height:       72.0 in Accession #:    [id number]   Weight:       250.9 lb Date of Birth:  07-18-1965   BSA:          2.346 m Patient Age:    53 years     BP:           134/75 mmHg Patient Gender: M            HR:           83 bpm. Exam Location:  Inpatient Procedure: Limited Echo                      STAT ECHO Reported to: Dr Nicholes Mango on 05/03/2019 6:05:00 PM       Dr. Gala Romney reviewed echo at bedside. Indications:    Ventricular Tachycardia I47.2  History:        Patient has prior  history of Echocardiogram examinations, most                 recent 05/01/2019. Risk Factors:Current Smoker. Aortic dissection.                 S/p ascending aortic replacement.  Sonographer:    Ross Ludwig RDCS (AE) Referring Phys: [id number] DANIEL R BENSIMHON  Sonographer Comments: No subcostal window. Drainage bandages in place subcostally. Large bandage over sternum. IMPRESSIONS  1. Prominent paradoxical septal motion and respiratory displacement of the intraventricular septum is seen. Left ventricular ejection fraction, by estimation, is 45 to 50%. The left ventricle has mildly decreased function. The left ventricle demonstrates global hypokinesis. There is mild concentric left ventricular hypertrophy. Left ventricular diastolic parameters were normal.  2. Right ventricular systolic function not well seen, probably mildly reduced. The right ventricular size is normal. Tricuspid regurgitation signal is inadequate for assessing PA pressure.  3. The mitral valve is normal in structure and function. No evidence of mitral valve regurgitation.  4. Native aortic valve preserved/repaired. The aortic valve is tricuspid. Aortic valve regurgitation is not visualized. Aortic valve mean gradient measures 1.7 mmHg.  5. 28 mm tube graft seen in the ascending aorta, with intact anastomoses. Residual dissection is not identified (on limited aortic views). 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 FINDINGS  Left Ventricle: Prominent paradoxical septal motion and respiratory displacement of the intraventricular septum is seen. Left ventricular ejection fraction, by estimation, is 45 to 50%. The left ventricle has mildly decreased function. The left ventricle demonstrates global hypokinesis. The left ventricular internal cavity size was normal in size. There is mild concentric left ventricular hypertrophy. Abnormal (paradoxical) septal motion consistent with post-operative status. Indeterminate filling pressures. Right  Ventricle: The right ventricular size is normal. Right ventricular systolic function not well seen, probably mildly reduced. Tricuspid regurgitation signal is inadequate for assessing PA pressure. Left Atrium: Left atrial size was normal in size. Right Atrium: Right atrial size was not well visualized. Pericardium: There is no evidence of pericardial effusion. Mitral Valve: The mitral valve is normal in structure and function. Tricuspid Valve: The tricuspid valve is not well visualized. Tricuspid valve regurgitation is not demonstrated. Aortic Valve: Native aortic valve preserved/repaired. The aortic valve is tricuspid. Aortic valve regurgitation is not visualized. Aortic valve mean gradient measures 1.7 mmHg. Aortic valve peak gradient  measures 3.3 mmHg. Aortic valve area, by VTI measures 3.31 cm. Pulmonic Valve: The pulmonic valve was grossly normal. Pulmonic valve regurgitation is not visualized. Aorta: 28 mm tube graft seen in the ascending aorta, with intact anastomoses. Residual dissection is not identified (on limited aortic views). 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  LEFT VENTRICLE PLAX 2D LVIDd:         4.30 cm  Diastology LVIDs:         3.70 cm  LV e' lateral:   7.51 cm/s LV PW:         1.37 cm  LV E/e' lateral: 10.1 LV IVS:        1.34 cm  LV e' medial:    8.49 cm/s LVOT diam:     2.00 cm  LV E/e' medial:  8.9 LV SV:         47 LV SV Index:   20 LVOT Area:     3.14 cm  LEFT ATRIUM         Index LA diam:    3.00 cm 1.28 cm/m  AORTIC VALVE AV Area (Vmax):    3.19 cm AV Area (Vmean):   3.01 cm AV Area (VTI):     3.31 cm AV Vmax:           90.37 cm/s AV Vmean:          60.400 cm/s AV VTI:            0.141 m AV Peak Grad:      3.3 mmHg AV Mean Grad:      1.7 mmHg LVOT Vmax:         91.90 cm/s LVOT Vmean:        57.900 cm/s LVOT VTI:          0.149 m LVOT/AV VTI ratio: 1.05  AORTA Ao Root diam: 3.90 cm MITRAL VALVE MV Area (PHT): 3.53 cm    SHUNTS MV Decel Time: 215 msec     Systemic VTI:  0.15 m MV E velocity: 75.80 cm/s  Systemic Diam: 2.00 cm MV A velocity: 47.10 cm/s MV E/A ratio:  1.61 Mihai Croitoru MD Electronically signed by Thurmon FairMihai Croitoru MD Signature Date/Time: 05/03/2019/7:29:59 PM    Final     Assessment/Plan: 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   Stroke: Continue secondary stroke prophylaxis and Risk Factor Modification listed below:   Antiplatelet therapy:   Blood Pressure Management:  Continue current medication with prn's with permisive HTN per primary team Statin Agent:   Tobacco abuse: Counseled Motor recovery: Fluoxetine Labs and imaging (see above) independently reviewed.  Records reviewed and summated above.  1. Does the need for close, 24 hr/day medical supervision in concert with the patient's rehab needs make it unreasonable for this patient to be served in a less intensive setting? Yes 2. Co-Morbidities requiring supervision/potential complications: HTN (monitor and provide prns in accordance with increased physical exertion and pain), hyperlipidemia, tobacco abuse (counsel), tachypnea (monitor RR and O2 Sats with increased physical exertion), leukocytosis (repeat labs, cont to monitor for signs and symptoms of infection, further workup if indicated) 3. Due to safety, disease management, medication administration and patient education, does the patient require 24 hr/day rehab nursing? Yes 4. Does the patient require coordinated care of a physician, rehab nurse, therapy disciplines of PT/OT/SLP to address physical and functional deficits in the context of the above medical diagnosis(es)? Yes Addressing deficits in the following areas: balance, endurance, locomotion, strength, transferring, bathing, dressing, toileting,  speech, language, swallowing and psychosocial support 5. Can the patient actively participate in an intensive therapy program of at least 3 hrs of therapy per day at least 5 days per week? Likely 6. The potential for patient  to make measurable gains while on inpatient rehab is excellent 7. Anticipated functional outcomes upon discharge from inpatient rehab are TBD  with PT, TBD with OT, supervision with SLP. 8. Estimated rehab length of stay to reach the above functional goals is: TBD 9. Anticipated discharge destination: Home 10. Overall Rehab/Functional Prognosis: good  RECOMMENDATIONS: This patient's condition is appropriate for continued rehabilitative care in the following setting: We will need to await completion of therapy evaluation CIR once medically stable. Patient has agreed to participate in recommended program. Potentially Note that insurance prior authorization may be required for reimbursement for recommended care.  Comment: Rehab Admissions Coordinator to follow up.  I have personally performed a face to face diagnostic evaluation, including, but not limited to relevant history and physical exam findings, of this patient and developed relevant assessment and plan.  Additionally, I have reviewed and concur with the physician assistant's documentation above.   Maryla Morrow, MD, ABPMR Mcarthur Rossetti Angiulli, PA-C 05/04/2019

## 2019-05-03 NOTE — Consult Note (Signed)
Advanced Heart Failure Team Consult Note   Primary Physician: [facility] PCP-Cardiologist:  No primary care provider on file.   Referring: Dr. Vickey Sages  Reason for Consultation: VT  HPI:    Erik Parker is seen today for evaluation of VT at the request of Dr. Vickey Sages.   54 y/o h/o HTN and HL. Presented to [facility] on 3/2 with acute CVA. CTA head showed a left frontal operculum acute nonhemorrhagic infarct. Patient was noted to also have a dissection within the asending aorta on the lower cuts of the head/neck CT.  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 Vickey Sages. (No pre-op cath)  Pre-op TEE reviewed personally EF 55% with LVH. Flap in ascending aorta. No regional WMA. Pre-op hstrop 8.   Has been stable post-op though continues to have severe expressive aphasia and is moaning in bed. BP allowed to drift up with recent CVA.   This evening had 23 beat run VT. ECG obtained and mild inferolateral ST elevation suggestive of acute pericarditis. Started on colchicine.   Given VT and abnormal ECG we were consulted foe help with further management.   Patient is unable to provide any history due to aphasia. K 3.7. mag pending. I ordered stat echo. EF ~50% with post surgical septal changes. Inferior wall moves well. hstrop stable at 1,700-1,900 post-op    Review of Systems: unable to obtain due to aphasia   Home Medications Prior to Admission medications   Medication Sig Start Date End Date Taking? Authorizing Provider  amLODipine (NORVASC) 10 MG tablet Take 10 mg by mouth daily. 10/12/16  Yes [provider]  aspirin EC 81 MG tablet Take 81 mg by mouth daily.   Yes [provider]  famotidine (PEPCID) 20 MG tablet Take 1 tablet (20 mg total) by mouth 2 (two) times daily. 04/30/19  Yes Gerhard Munch, MD  folic acid (FOLVITE) 1 MG tablet Take 1 mg by mouth daily. 03/22/19  Yes  [provider]  hydrochlorothiazide (HYDRODIURIL) 25 MG tablet Take 25 mg by mouth daily.   Yes [provider]  lovastatin (MEVACOR) 40 MG tablet Take 20 mg by mouth at bedtime.    Yes [provider]  Methotrexate Sodium (METHOTREXATE, PF,) 50 MG/2ML injection Inject 0.8 mg into the muscle once a week.  03/22/19  Yes [provider]  predniSONE (DELTASONE) 20 MG tablet Take 20 mg by mouth daily with breakfast.   Yes [provider]  sucralfate (CARAFATE) 1 g tablet Take 1 tablet (1 g total) by mouth 4 (four) times daily -  with meals and at bedtime. 04/30/19  Yes Gerhard Munch, MD    Past Medical History: Past Medical History:  Diagnosis Date  . Hyperlipidemia   . Hypertension     Past Surgical History: Past Surgical History:  Procedure Laterality Date  . ACHILLES TENDON SURGERY Right 11/10/2016   Procedure: ACHILLES TENDON REPAIR VIA SPEED BRIDGE;  Surgeon: [name], DPM;  Location: [facility];  Service: Podiatry;  Laterality: Right;  . HEEL SPUR RESECTION Right 11/10/2016   Procedure: HEEL SPUR RESECTION (HAGLUNDS DEFORMITY);  Surgeon: [name], DPM;  Location: [facility];  Service: Podiatry;  Laterality: Right;  . REPAIR OF ACUTE ASCENDING THORACIC AORTIC DISSECTION N/A 05/01/2019   Procedure: REPAIR OF ACUTE ASCENDING THORACIC AORTIC DISSECTION USING HEMOSHIELD GRAFT SIZE ;  Surgeon: Linden Dolin, MD;  Location: [facility] OR;  Service:  Cardiothoracic;  Laterality: N/A;    Family History: Family History  Problem Relation Age of Onset  . Hypertension Mother   . Hypertension Father     Social History: Social History   Socioeconomic History  . Marital status: Single    Spouse name: Not on file  . Number of children: Not on file  . Years of education: Not on file  . Highest education level: Not on file  Occupational History  . Not on file  Tobacco Use  . Smoking status: Current Every Day Smoker    Packs/day: 0.50     Years: 9.00    Pack years: 4.50    Types: Cigarettes  . Smokeless tobacco: Never Used  Substance and Sexual Activity  . Alcohol use: Yes    Comment: occ  . Drug use: No  . Sexual activity: Not on file  Other Topics Concern  . Not on file  Social History Narrative  . Not on file   Social Determinants of Health   Financial Resource Strain:   . Difficulty of Paying Living Expenses: Not on file  Food Insecurity:   . Worried About Charity fundraiser in the Last Year: Not on file  . Ran Out of Food in the Last Year: Not on file  Transportation Needs:   . Lack of Transportation (Medical): Not on file  . Lack of Transportation (Non-Medical): Not on file  Physical Activity:   . Days of Exercise per Week: Not on file  . Minutes of Exercise per Session: Not on file  Stress:   . Feeling of Stress : Not on file  Social Connections:   . Frequency of Communication with Friends and Family: Not on file  . Frequency of Social Gatherings with Friends and Family: Not on file  . Attends Religious Services: Not on file  . Active Member of Clubs or Organizations: Not on file  . Attends Archivist Meetings: Not on file  . Marital Status: Not on file    Allergies:  No Known Allergies  Objective:    Vital Signs:   Temp:  [96.8 F (36 C)-99.1 F (37.3 C)] 99.1 F (37.3 C) (03/04 2112) Pulse Rate:  [80-98] 87 (03/04 2230) Resp:  [14-31] 21 (03/04 2230) BP: (103-147)/(66-82) 107/80 (03/04 2230) SpO2:  [92 %-99 %] 95 % (03/04 2230) Arterial Line BP: (107-164)/(52-77) 134/60 (03/04 2230) Weight:  [113.8 kg] 113.8 kg (03/04 0500)    Weight change: Filed Weights   05/01/19 0704 05/02/19 0500 05/03/19 0500  Weight: 115 kg 117.1 kg 113.8 kg    Intake/Output:   Intake/Output Summary (Last 24 hours) at 05/03/2019 2251 Last data filed at 05/03/2019 2200 Gross per 24 hour  Intake 948.17 ml  Output 2250 ml  Net -1301.83 ml      Physical Exam    General:  Patient lying in  bed moaning. Does not follow commands well  HEENT: normal Neck: supple. JVP 8-9. Carotids 2+ bilat; no bruits. No lymphadenopathy or thyromegaly appreciated. Cor: Sternal dressing ok PMI nondisplaced. Regular rate & rhythm. No rubs, gallops or murmurs. Lungs: clear Abdomen: soft, nontender, + distended. No hepatosplenomegaly. No bruits or masses. Good bowel sounds. Extremities: no cyanosis, clubbing, rash, edema Neuro: Patient lying in bed moaning. Aphasic Does not follow commands well    Telemetry   Sinus rhythm 23 beat run monomorphic VT Personally reviewed   EKG    NSR with up-sloping inferolateral ST elevation suggestive of acute pericarditis. Personally reviewed  Labs   Basic Metabolic Panel: Recent Labs  Lab 04/30/19 1257 05/01/19 0700 05/01/19 2229 05/02/19 0032 05/02/19 0405 05/02/19 0405 05/02/19 0409 05/02/19 0706 05/02/19 1624 05/03/19 0435 05/03/19 1800  NA 138   < > 139   < > 137   < > 140 140 136 138 137  K 3.7   < > 4.5   < > 3.9   < > 4.0 3.8 3.8 3.7 3.7  CL 106   < > 111  --  107  --   --   --  106 106 105  CO2 22   < > 20*  --  21*  --   --   --  23 25 22   GLUCOSE 123*   < > 156*  --  123*  --   --   --  145* 135* 132*  BUN 18   < > 17  --  13  --   --   --  10 7 9   CREATININE 0.80   < > 0.76  --  0.71  --   --   --  0.71 0.59* 0.73  CALCIUM 9.4   < > 8.5*   < > 8.5*   < >  --   --  8.4* 8.6* 8.6*  MG 1.8  --  2.6*  --  2.1  --   --   --  2.1  --   --    < > = values in this interval not displayed.    Liver Function Tests: Recent Labs  Lab 04/30/19 1257 05/01/19 0700  AST 20 15  ALT 33 27  ALKPHOS 52 47  BILITOT 0.6 0.9  PROT 7.2 6.9  ALBUMIN 4.3 3.9   Recent Labs  Lab 04/30/19 1432  LIPASE 47   No results for input(s): AMMONIA in the last 168 hours.  CBC: Recent Labs  Lab 04/30/19 1257 04/30/19 1257 05/01/19 0700 05/01/19 0716 05/01/19 1716 05/01/19 1807 05/01/19 2229 05/02/19 0032 05/02/19 0405 05/02/19 0409  05/02/19 0706 05/02/19 1624 05/03/19 0435  WBC 15.6*   < > 16.2*   < > 17.7*  --  18.3*  --  16.3*  --   --  18.9* 16.3*  NEUTROABS 11.1*  --  10.4*  --   --   --   --   --   --   --   --   --   --   HGB 14.9   < > 13.9   < > 12.1*   < > 11.7*   < > 10.9* 11.2* 11.6* 11.1* 11.1*  HCT 45.4   < > 42.7   < > 37.4*   < > 35.9*   < > 33.8* 33.0* 34.0* 34.4* 33.6*  MCV 87.5   < > 87.5   < > 87.4  --  87.6  --  88.5  --   --  87.5 87.5  PLT 307   < > 281   < > 166  --  211  --  195  --   --  221 197   < > = values in this interval not displayed.    Cardiac Enzymes: No results for input(s): CKTOTAL, CKMB, CKMBINDEX, TROPONINI in the last 168 hours.  BNP: BNP (last 3 results) Recent Labs    04/30/19 1257  BNP 31.0    ProBNP (last 3 results) No results for input(s): PROBNP in the last 8760 hours.   CBG: Recent Labs  Lab 05/03/19 0434 05/03/19 0839 05/03/19 1115 05/03/19 1549 05/03/19 2103  GLUCAP 125* 124* 143* 110* 106*    Coagulation Studies: Recent Labs    05/01/19 0700 05/01/19 1716  LABPROT 13.1 15.6*  INR 1.0 1.3*     Imaging   CT ANGIO HEAD W OR WO CONTRAST  Result Date: 05/03/2019 CLINICAL DATA:  Multifocal stroke.  Vascular dissection. EXAM: CT ANGIOGRAPHY HEAD AND NECK TECHNIQUE: Multidetector CT imaging of the head and neck was performed using the standard protocol during bolus administration of intravenous contrast. Multiplanar CT image reconstructions and MIPs were obtained to evaluate the vascular anatomy. Carotid stenosis measurements (when applicable) are obtained utilizing NASCET criteria, using the distal internal carotid diameter as the denominator. CONTRAST:  80mL OMNIPAQUE IOHEXOL 350 MG/ML SOLN COMPARISON:  MRI same day.  CT studies yesterday. FINDINGS: CTA NECK FINDINGS Aortic arch: The patient has had interval surgical repair of the aorta. Based on the start of the scan, dissection flap remains visible at the arch and proximal descending aorta. The  ascending aorta is not included on this scan. As seen previously, the dissection involves also the innominate artery and left common carotid artery as well as the proximal left subclavian artery. Right carotid system: Dissection extension into the right common carotid artery appears the same, ending proximal to the bifurcation. The true lumen is larger than was seen yesterday. Dissection does not extend into the internal carotid artery. Left carotid system: As seen yesterday, the dissection extends throughout the left common carotid artery. The true lumen is mm or 2 larger than was seen yesterday. The dissection extends into the internal carotid artery as high as the C2 level but does not extend intracranial. Vertebral arteries: Both vertebral arteries perfuse normally. Skeleton: Ordinary spondylosis. Other neck: Otherwise negative Upper chest: Postoperative changes as expected. Review of the MIP images confirms the above findings CTA HEAD FINDINGS Anterior circulation: Both internal carotid arteries are patent through the siphon regions. The anterior and middle cerebral vessels show flow. No proximal stenosis. No discernible large vessel occlusion presently. Some luxury perfusion in the region of the left operculum ir infarction. Posterior circulation: Both vertebral arteries widely patent to the basilar. No basilar stenosis. Posterior circulation branch vessels are normal. Primary fetal origin of both posterior cerebral arteries. Venous sinuses: Patent and normal. Anatomic variants: None other significant. Review of the MIP images confirms the above findings IMPRESSION: Interval aortic repair, not primarily or completely evaluated. Dissection again extensions into both carotid systems but does not appear to have propagated any further. In fact, true luminal size of the common carotid regions is improved. No intracranial large vessel occlusion demonstrable presently. Small region of luxury perfusion related to the  left frontal operculum infarction. Electronically Signed   By: Paulina FusiMark  Shogry M.D.   On: 05/03/2019 00:15   DG Chest 1 View  Result Date: 05/03/2019 CLINICAL DATA:  CABG 2 days ago. EXAM: CHEST  1 VIEW COMPARISON:  One-view chest x-ray 05/02/2019 FINDINGS: The heart is enlarged, exaggerated by low lung volumes. Swan-Ganz catheter was removed. Mediastinal drains remain in place. Right-sided chest tube scratched at left-sided chest tube is stable. Mild bibasilar atelectasis is improving. Surgical clips are present at the right axilla. IMPRESSION: 1. Improving bibasilar atelectasis. 2. Interval removal of Swan-Ganz catheter. Electronically Signed   By: Marin Robertshristopher  Mattern M.D.   On: 05/03/2019 06:52   CT ANGIO NECK W OR WO CONTRAST  Result Date: 05/03/2019 CLINICAL DATA:  Multifocal stroke.  Vascular dissection. EXAM: CT  ANGIOGRAPHY HEAD AND NECK TECHNIQUE: Multidetector CT imaging of the head and neck was performed using the standard protocol during bolus administration of intravenous contrast. Multiplanar CT image reconstructions and MIPs were obtained to evaluate the vascular anatomy. Carotid stenosis measurements (when applicable) are obtained utilizing NASCET criteria, using the distal internal carotid diameter as the denominator. CONTRAST:  31mL OMNIPAQUE IOHEXOL 350 MG/ML SOLN COMPARISON:  MRI same day.  CT studies yesterday. FINDINGS: CTA NECK FINDINGS Aortic arch: The patient has had interval surgical repair of the aorta. Based on the start of the scan, dissection flap remains visible at the arch and proximal descending aorta. The ascending aorta is not included on this scan. As seen previously, the dissection involves also the innominate artery and left common carotid artery as well as the proximal left subclavian artery. Right carotid system: Dissection extension into the right common carotid artery appears the same, ending proximal to the bifurcation. The true lumen is larger than was seen yesterday.  Dissection does not extend into the internal carotid artery. Left carotid system: As seen yesterday, the dissection extends throughout the left common carotid artery. The true lumen is mm or 2 larger than was seen yesterday. The dissection extends into the internal carotid artery as high as the C2 level but does not extend intracranial. Vertebral arteries: Both vertebral arteries perfuse normally. Skeleton: Ordinary spondylosis. Other neck: Otherwise negative Upper chest: Postoperative changes as expected. Review of the MIP images confirms the above findings CTA HEAD FINDINGS Anterior circulation: Both internal carotid arteries are patent through the siphon regions. The anterior and middle cerebral vessels show flow. No proximal stenosis. No discernible large vessel occlusion presently. Some luxury perfusion in the region of the left operculum ir infarction. Posterior circulation: Both vertebral arteries widely patent to the basilar. No basilar stenosis. Posterior circulation branch vessels are normal. Primary fetal origin of both posterior cerebral arteries. Venous sinuses: Patent and normal. Anatomic variants: None other significant. Review of the MIP images confirms the above findings IMPRESSION: Interval aortic repair, not primarily or completely evaluated. Dissection again extensions into both carotid systems but does not appear to have propagated any further. In fact, true luminal size of the common carotid regions is improved. No intracranial large vessel occlusion demonstrable presently. Small region of luxury perfusion related to the left frontal operculum infarction. Electronically Signed   By: Paulina Fusi M.D.   On: 05/03/2019 00:15   DG Swallowing Func-Speech Pathology  Result Date: 05/03/2019 Objective Swallowing Evaluation: Type of Study: MBS-Modified Barium Swallow Study  Patient Details Name: Erik Parker MRN: [id number] Date of Birth: [birth year] Today's Date: 05/03/2019 Time: SLP Start Time (ACUTE  ONLY): 1040 -SLP Stop Time (ACUTE ONLY): 1100 SLP Time Calculation (min) (ACUTE ONLY): 20 min Past Medical History: Past Medical History: Diagnosis Date . Hyperlipidemia  . Hypertension  Past Surgical History: Past Surgical History: Procedure Laterality Date . ACHILLES TENDON SURGERY Right 11/10/2016  Procedure: ACHILLES TENDON REPAIR VIA SPEED BRIDGE;  Surgeon: [name], DPM;  Location: [facility];  Service: Podiatry;  Laterality: Right; . HEEL SPUR RESECTION Right 11/10/2016  Procedure: HEEL SPUR RESECTION (HAGLUNDS DEFORMITY);  Surgeon: [name], DPM;  Location: [facility];  Service: Podiatry;  Laterality: Right; . REPAIR OF ACUTE ASCENDING THORACIC AORTIC DISSECTION N/A 05/01/2019  Procedure: REPAIR OF ACUTE ASCENDING THORACIC AORTIC DISSECTION USING HEMOSHIELD GRAFT SIZE ;  Surgeon: Linden Dolin, MD;  Location: [facility];  Service: Cardiothoracic;  Laterality: N/A; HPI: Erik Parker is a 54 y.o. male  with history of hypertension, hyperlipidemia.  Patient apparently was seen in the emergency department on 05/01/2019 for right facial droop and inability to speak along with right arm weakness. Currently presnts with expressive aphasia. No h/o swallowing difficulties.  Subjective: alert, cooperative Assessment / Plan / Recommendation CHL IP CLINICAL IMPRESSIONS 05/03/2019 Clinical Impression Pt was seen in radiology suite for modified barium swallow study. Trials of puree solids, dysphagia 2 solids, regular texture solids, thin liquids, and nectar thick liquids via cup and straw were administered. Pt presents with moderate oropharyngeal dysphagia characterized by impaired bolus control, prolonged mastication, a pharyngeal delay, and reduced anterior laryngeal movement. Bolus manipulation was reduced during mastication and mastication was inadequately thorough. He demonstrated premature spillage to the level of valleculae and pyriform sinuses. The swallow was often triggered with the head of the bolus at  the level of the pyriform sinuses with thin liquids and at the level of the valleculae with other consistencies. Aspiration (PAS 7) was noted with thin liquids after the swallow and was likely due to residue in the pyriform sinuses. However, this cannot be conclusively stated since the aspiration event occurred between swallows when the fluoro was off. Coughing was noted throughout the study even in the absence of laryngeal invasion but coughing was ineffective in expelling the aspirant. A puree diet with nectar thick liquids may be started at this time with strict observance of swallowing precautions. However, considering the level of fatigue that he demonstrated and endorsed having at the end of the study, his endurance during meals is questioned and supplemental non-oral nutrition may therefore be needed. SLP will follow to assess diet tolerance and for treatment.  SLP Visit Diagnosis Dysphagia, unspecified (R13.10) Attention and concentration deficit following -- Frontal lobe and executive function deficit following -- Impact on safety and function Mild aspiration risk;Moderate aspiration risk   CHL IP TREATMENT RECOMMENDATION 05/03/2019 Treatment Recommendations Therapy as outlined in treatment plan below   Prognosis 05/03/2019 Prognosis for Safe Diet Advancement Good Barriers to Reach Goals Language deficits;Severity of deficits Barriers/Prognosis Comment -- CHL IP DIET RECOMMENDATION 05/03/2019 SLP Diet Recommendations Dysphagia 1 (Puree) solids;Nectar thick liquid Liquid Administration via Cup;Straw Medication Administration Crushed with puree Compensations Slow rate;Small sips/bites Postural Changes Remain semi-upright after after feeds/meals (Comment);Seated upright at 90 degrees   CHL IP OTHER RECOMMENDATIONS 05/03/2019 Recommended Consults -- Oral Care Recommendations Oral care BID;Oral care before and after PO Other Recommendations Have oral suction available   CHL IP FOLLOW UP RECOMMENDATIONS 05/03/2019 Follow  up Recommendations (No Data)   CHL IP FREQUENCY AND DURATION 05/03/2019 Speech Therapy Frequency (ACUTE ONLY) min 2x/week Treatment Duration 2 weeks      CHL IP ORAL PHASE 05/03/2019 Oral Phase Impaired Oral - Pudding Teaspoon -- Oral - Pudding Cup -- Oral - Honey Teaspoon -- Oral - Honey Cup -- Oral - Nectar Teaspoon -- Oral - Nectar Cup Premature spillage;Decreased bolus cohesion;Delayed oral transit Oral - Nectar Straw Premature spillage;Decreased bolus cohesion;Delayed oral transit Oral - Thin Teaspoon -- Oral - Thin Cup Premature spillage;Decreased bolus cohesion;Delayed oral transit Oral - Thin Straw -- Oral - Puree Decreased bolus cohesion;Delayed oral transit;Piecemeal swallowing Oral - Mech Soft Impaired mastication;Delayed oral transit;Weak lingual manipulation Oral - Regular Impaired mastication;Decreased bolus cohesion Oral - Multi-Consistency -- Oral - Pill -- Oral Phase - Comment --  CHL IP PHARYNGEAL PHASE 05/03/2019 Pharyngeal Phase Impaired Pharyngeal- Pudding Teaspoon -- Pharyngeal -- Pharyngeal- Pudding Cup -- Pharyngeal -- Pharyngeal- Honey Teaspoon -- Pharyngeal -- Pharyngeal- Honey Cup -- Pharyngeal --  Pharyngeal- Nectar Teaspoon -- Pharyngeal -- Pharyngeal- Nectar Cup -- Pharyngeal -- Pharyngeal- Nectar Straw Delayed swallow initiation-vallecula;Pharyngeal residue - pyriform Pharyngeal -- Pharyngeal- Thin Teaspoon -- Pharyngeal -- Pharyngeal- Thin Cup Delayed swallow initiation-pyriform sinuses;Reduced anterior laryngeal mobility Pharyngeal -- Pharyngeal- Thin Straw Delayed swallow initiation-pyriform sinuses;Penetration/Apiration after swallow;Reduced anterior laryngeal mobility Pharyngeal Material enters airway, passes BELOW cords and not ejected out despite cough attempt by patient Pharyngeal- Puree Reduced anterior laryngeal mobility Pharyngeal -- Pharyngeal- Mechanical Soft Reduced anterior laryngeal mobility Pharyngeal -- Pharyngeal- Regular -- Pharyngeal -- Pharyngeal- Multi-consistency --  Pharyngeal -- Pharyngeal- Pill -- Pharyngeal -- Pharyngeal Comment --  CHL IP CERVICAL ESOPHAGEAL PHASE 05/03/2019 Cervical Esophageal Phase WFL Pudding Teaspoon -- Pudding Cup -- Honey Teaspoon -- Honey Cup -- Nectar Teaspoon -- Nectar Cup -- Nectar Straw -- Thin Teaspoon -- Thin Cup -- Thin Straw -- Puree -- Mechanical Soft -- Regular -- Multi-consistency -- Pill -- Cervical Esophageal Comment -- Shanika I. Vear Clock, MS, CCC-SLP Acute Rehabilitation Services Office number [phone] Pager [phone] Scheryl Marten 05/03/2019, 12:23 PM              ECHOCARDIOGRAM LIMITED  Result Date: 05/03/2019    ECHOCARDIOGRAM LIMITED REPORT   Patient Name:   Erik Parker Date of Exam: 05/03/2019 Medical Rec #:  [id number]    Height:       72.0 in Accession #:    [id number]   Weight:       250.9 lb Date of Birth:  June 04, 1965   BSA:          2.346 m Patient Age:    53 years     BP:           134/75 mmHg Patient Gender: M            HR:           83 bpm. Exam Location:  Inpatient Procedure: Limited Echo                      STAT ECHO Reported to: Dr Nicholes Mango on 05/03/2019 6:05:00 PM       Dr. Gala Romney reviewed echo at bedside. Indications:    Ventricular Tachycardia I47.2  History:        Patient has prior history of Echocardiogram examinations, most                 recent 05/01/2019. Risk Factors:Current Smoker. Aortic dissection.                 S/p ascending aortic replacement.  Sonographer:    Ross Ludwig RDCS (AE) Referring Phys: [id number] Augustine Leverette R Darleth Eustache  Sonographer Comments: No subcostal window. Drainage bandages in place subcostally. Large bandage over sternum. IMPRESSIONS  1. Prominent paradoxical septal motion and respiratory displacement of the intraventricular septum is seen. Left ventricular ejection fraction, by estimation, is 45 to 50%. The left ventricle has mildly decreased function. The left ventricle demonstrates global hypokinesis. There is mild concentric left ventricular hypertrophy. Left ventricular  diastolic parameters were normal.  2. Right ventricular systolic function not well seen, probably mildly reduced. The right ventricular size is normal. Tricuspid regurgitation signal is inadequate for assessing PA pressure.  3. The mitral valve is normal in structure and function. No evidence of mitral valve regurgitation.  4. Native aortic valve preserved/repaired. The aortic valve is tricuspid. Aortic valve regurgitation is not visualized. Aortic valve mean gradient measures 1.7 mmHg.  5. 28 mm tube graft seen in the  ascending aorta, with intact anastomoses. Residual dissection is not identified (on limited aortic views). 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 FINDINGS  Left Ventricle: Prominent paradoxical septal motion and respiratory displacement of the intraventricular septum is seen. Left ventricular ejection fraction, by estimation, is 45 to 50%. The left ventricle has mildly decreased function. The left ventricle demonstrates global hypokinesis. The left ventricular internal cavity size was normal in size. There is mild concentric left ventricular hypertrophy. Abnormal (paradoxical) septal motion consistent with post-operative status. Indeterminate filling pressures. Right Ventricle: The right ventricular size is normal. Right ventricular systolic function not well seen, probably mildly reduced. Tricuspid regurgitation signal is inadequate for assessing PA pressure. Left Atrium: Left atrial size was normal in size. Right Atrium: Right atrial size was not well visualized. Pericardium: There is no evidence of pericardial effusion. Mitral Valve: The mitral valve is normal in structure and function. Tricuspid Valve: The tricuspid valve is not well visualized. Tricuspid valve regurgitation is not demonstrated. Aortic Valve: Native aortic valve preserved/repaired. The aortic valve is tricuspid. Aortic valve regurgitation is not visualized. Aortic valve mean gradient measures 1.7 mmHg. Aortic  valve peak gradient measures 3.3 mmHg. Aortic valve area, by VTI measures 3.31 cm. Pulmonic Valve: The pulmonic valve was grossly normal. Pulmonic valve regurgitation is not visualized. Aorta: 28 mm tube graft seen in the ascending aorta, with intact anastomoses. Residual dissection is not identified (on limited aortic views). 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  LEFT VENTRICLE PLAX 2D LVIDd:         4.30 cm  Diastology LVIDs:         3.70 cm  LV e' lateral:   7.51 cm/s LV PW:         1.37 cm  LV E/e' lateral: 10.1 LV IVS:        1.34 cm  LV e' medial:    8.49 cm/s LVOT diam:     2.00 cm  LV E/e' medial:  8.9 LV SV:         47 LV SV Index:   20 LVOT Area:     3.14 cm  LEFT ATRIUM         Index LA diam:    3.00 cm 1.28 cm/m  AORTIC VALVE AV Area (Vmax):    3.19 cm AV Area (Vmean):   3.01 cm AV Area (VTI):     3.31 cm AV Vmax:           90.37 cm/s AV Vmean:          60.400 cm/s AV VTI:            0.141 m AV Peak Grad:      3.3 mmHg AV Mean Grad:      1.7 mmHg LVOT Vmax:         91.90 cm/s LVOT Vmean:        57.900 cm/s LVOT VTI:          0.149 m LVOT/AV VTI ratio: 1.05  AORTA Ao Root diam: 3.90 cm MITRAL VALVE MV Area (PHT): 3.53 cm    SHUNTS MV Decel Time: 215 msec    Systemic VTI:  0.15 m MV E velocity: 75.80 cm/s  Systemic Diam: 2.00 cm MV A velocity: 47.10 cm/s MV E/A ratio:  1.61 Mihai Croitoru MD Electronically signed by Thurmon Fair MD Signature Date/Time: 05/03/2019/7:29:59 PM    Final       Medications:     Current Medications: . acetaminophen  1,000 mg Oral  Q6H   Or  . acetaminophen (TYLENOL) oral liquid 160 mg/5 mL  1,000 mg Per Tube Q6H  . acetaminophen (TYLENOL) oral liquid 160 mg/5 mL  650 mg Per Tube Once   Or  . acetaminophen  650 mg Rectal Once  . aspirin EC  325 mg Oral Daily   Or  . aspirin  324 mg Per Tube Daily  . bisacodyl  10 mg Oral Daily   Or  . bisacodyl  10 mg Rectal Daily  . chlorhexidine  15 mL Mouth Rinse BID  . Chlorhexidine Gluconate  Cloth  6 each Topical Daily  . colchicine  0.6 mg Oral Daily  . docusate sodium  200 mg Oral Daily  . folic acid  1 mg Oral Daily  . insulin aspart  0-24 Units Subcutaneous Q4H  . ketorolac  15 mg Intravenous Q6H  . mouth rinse  15 mL Mouth Rinse q12n4p  . metoprolol tartrate  5 mg Intravenous Q6H  . pantoprazole (PROTONIX) IV  40 mg Intravenous Q24H  . pravastatin  40 mg Oral q1800  . sodium chloride flush  10-40 mL Intracatheter Q12H  . sodium chloride flush  3 mL Intravenous Q12H     Infusions: . sodium chloride    . sodium chloride 10 mL/hr at 05/03/19 2200  . lactated ringers 10 mL/hr at 05/03/19 2200  . niCARDipine    . nitroGLYCERIN Stopped (05/03/19 1915)  . phenylephrine (NEO-SYNEPHRINE) Adult infusion Stopped (05/02/19 0435)      Assessment/Plan   1. Monomorphic VT - obviously the main question here is if this is ischemic-mediated or not as coronaries were not able to be evaluated in setting of acute dissection. On pre-op studies and in OR no evidence of RCA involvement - we did stat echo tonight and no regional wall motion abnormalities to suggest ACS - hstrop moderately elevated but flat in setting of heart surgery, Not c/w ACS - will continue to follow on tele Keep K> 4.0 mg > 2.0 - start b-blocker - no good candidate for invasive eval with recent dissection -> [place] and acute CVA. Can consider cardiac CTA if HR acceptable and he can cooperate with testing  2. 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 - stable by echo   3. Acute post-op pericarditis - agree with colchicine  4. CVA, acute with severe aphasia - Neuro following  5. HTN - Allowing for moderate permissive HTN in setting of recent CVA  CRITICAL CARE Performed by: Arvilla MeresBensimhon, Adrien Shankar  Total critical care time: 55 minutes  Critical care time was exclusive of separately billable procedures and treating other patients.  Critical care was necessary to treat or  prevent imminent or life-threatening deterioration.  Critical care was time spent personally by me (independent of midlevel providers or residents) on the following activities: development of treatment plan with patient and/or surrogate as well as nursing, discussions with consultants, evaluation of patient's response to treatment, examination of patient, obtaining history from patient or surrogate, ordering and performing treatments and interventions, ordering and review of laboratory studies, ordering and review of radiographic studies, pulse oximetry and re-evaluation of patient's condition.  Arvilla Meresaniel Henry Demeritt, MD  11:06 PM   Advanced Heart Failure Team Pager [phone] (M-F; 7a - 4p)  Please contact [facility] for night-coverage after hours (4p -7a ) and weekends on [email or web address]

## 2019-05-03 NOTE — Progress Notes (Signed)
  Echocardiogram 2D Echocardiogram has been performed.  Erik Parker 05/03/2019, 6:36 PM

## 2019-05-03 NOTE — Progress Notes (Signed)
Modified Barium Swallow Progress Note  Patient Details  Name: Erik Parker MRN: [id number] Date of Birth: [birth year]  Today's Date: 05/03/2019  Modified Barium Swallow completed.  Full report located under Chart Review in the Imaging Section.  Brief recommendations include the following:  Clinical Impression  Pt was seen in radiology suite for modified barium swallow study. Trials of puree solids, dysphagia 2 solids, regular texture solids, thin liquids, and nectar thick liquids via cup and straw were administered. Pt presents with moderate oropharyngeal dysphagia characterized by impaired bolus control, prolonged mastication, a pharyngeal delay, and reduced anterior laryngeal movement. Bolus manipulation was reduced during mastication and mastication was inadequately thorough. He demonstrated premature spillage to the level of valleculae and pyriform sinuses. The swallow was often triggered with the head of the bolus at the level of the pyriform sinuses with thin liquids and at the level of the valleculae with other consistencies. Aspiration (PAS 7) was noted with thin liquids after the swallow and was likely due to residue in the pyriform sinuses. However, this cannot be conclusively stated since the aspiration event occurred between swallows when the fluoro was off. Coughing was noted throughout the study even in the absence of laryngeal invasion but coughing was ineffective in expelling the aspirant. A puree diet with nectar thick liquids may be started at this time with strict observance of swallowing precautions. However, considering the level of fatigue that he demonstrated and endorsed having at the end of the study, his endurance during meals is questioned and supplemental non-oral nutrition may therefore be needed. SLP will follow to assess diet tolerance and for treatment.    Swallow Evaluation Recommendations       SLP Diet Recommendations: Dysphagia 1 (Puree) solids;Nectar thick  liquid   Liquid Administration via: Cup;Straw   Medication Administration: Crushed with puree   Supervision: Full supervision/cueing for compensatory strategies;Staff to assist with self feeding   Compensations: Slow rate;Small sips/bites   Postural Changes: Remain semi-upright after after feeds/meals (Comment);Seated upright at 90 degrees   Oral Care Recommendations: Oral care BID;Oral care before and after PO   Other Recommendations: Have oral suction available   Tabetha Haraway I. Vear Clock, MS, CCC-SLP Acute Rehabilitation Services Office number [phone] Pager [phone]  Scheryl Marten 05/03/2019,12:16 PM

## 2019-05-03 NOTE — Progress Notes (Signed)
STROKE TEAM PROGRESS NOTE   INTERVAL HISTORY I personally reviewed history of presenting illness, electronic medical records and imaging films in PACS.  Patient presented with dissection of the ascending aorta and underwent emergent surgery and MRI scan shows multiple bihemispheric embolic infarcts.  He was extubated yesterday.  He is on his way to undergo go barium swallow eval in radiology.  Vitals:   05/03/19 1130 05/03/19 1200 05/03/19 1300 05/03/19 1400  BP: 127/72 124/82 136/74 133/76  Pulse: 92 92 94 93  Resp: (!) 25 (!) 27 (!) 23 (!) 22  Temp:      TempSrc:      SpO2: 96% 97% 97% 96%  Weight:      Height:        CBC:  Recent Labs  Lab 04/30/19 1257 04/30/19 1257 05/01/19 0700 05/01/19 0716 05/02/19 1624 05/03/19 0435  WBC 15.6*   < > 16.2*   < > 18.9* 16.3*  NEUTROABS 11.1*  --  10.4*  --   --   --   HGB 14.9   < > 13.9   < > 11.1* 11.1*  HCT 45.4   < > 42.7   < > 34.4* 33.6*  MCV 87.5   < > 87.5   < > 87.5 87.5  PLT 307   < > 281   < > 221 197   < > = values in this interval not displayed.    Basic Metabolic Panel:  Recent Labs  Lab 05/02/19 0405 05/02/19 0409 05/02/19 1624 05/03/19 0435  NA 137   < > 136 138  K 3.9   < > 3.8 3.7  CL 107   < > 106 106  CO2 21*   < > 23 25  GLUCOSE 123*   < > 145* 135*  BUN 13   < > 10 7  CREATININE 0.71   < > 0.71 0.59*  CALCIUM 8.5*   < > 8.4* 8.6*  MG 2.1  --  2.1  --    < > = values in this interval not displayed.   Lipid Panel:     Component Value Date/Time   TRIG 101 05/01/2019 2229   HgbA1c: No results found for: HGBA1C Urine Drug Screen: No results found for: LABOPIA, COCAINSCRNUR, LABBENZ, AMPHETMU, THCU, LABBARB  Alcohol Level     Component Value Date/Time   ETH <10 05/01/2019 0700    IMAGING past 24 hours CT ANGIO HEAD W OR WO CONTRAST  Result Date: 05/03/2019 CLINICAL DATA:  Multifocal stroke.  Vascular dissection. EXAM: CT ANGIOGRAPHY HEAD AND NECK TECHNIQUE: Multidetector CT imaging of the head  and neck was performed using the standard protocol during bolus administration of intravenous contrast. Multiplanar CT image reconstructions and MIPs were obtained to evaluate the vascular anatomy. Carotid stenosis measurements (when applicable) are obtained utilizing NASCET criteria, using the distal internal carotid diameter as the denominator. CONTRAST:  72mL OMNIPAQUE IOHEXOL 350 MG/ML SOLN COMPARISON:  MRI same day.  CT studies yesterday. FINDINGS: CTA NECK FINDINGS Aortic arch: The patient has had interval surgical repair of the aorta. Based on the start of the scan, dissection flap remains visible at the arch and proximal descending aorta. The ascending aorta is not included on this scan. As seen previously, the dissection involves also the innominate artery and left common carotid artery as well as the proximal left subclavian artery. Right carotid system: Dissection extension into the right common carotid artery appears the same, ending proximal to the bifurcation. The  true lumen is larger than was seen yesterday. Dissection does not extend into the internal carotid artery. Left carotid system: As seen yesterday, the dissection extends throughout the left common carotid artery. The true lumen is mm or 2 larger than was seen yesterday. The dissection extends into the internal carotid artery as high as the C2 level but does not extend intracranial. Vertebral arteries: Both vertebral arteries perfuse normally. Skeleton: Ordinary spondylosis. Other neck: Otherwise negative Upper chest: Postoperative changes as expected. Review of the MIP images confirms the above findings CTA HEAD FINDINGS Anterior circulation: Both internal carotid arteries are patent through the siphon regions. The anterior and middle cerebral vessels show flow. No proximal stenosis. No discernible large vessel occlusion presently. Some luxury perfusion in the region of the left operculum ir infarction. Posterior circulation: Both vertebral  arteries widely patent to the basilar. No basilar stenosis. Posterior circulation branch vessels are normal. Primary fetal origin of both posterior cerebral arteries. Venous sinuses: Patent and normal. Anatomic variants: None other significant. Review of the MIP images confirms the above findings IMPRESSION: Interval aortic repair, not primarily or completely evaluated. Dissection again extensions into both carotid systems but does not appear to have propagated any further. In fact, true luminal size of the common carotid regions is improved. No intracranial large vessel occlusion demonstrable presently. Small region of luxury perfusion related to the left frontal operculum infarction. Electronically Signed   By: Nelson Chimes M.D.   On: 05/03/2019 00:15   DG Chest 1 View  Result Date: 05/03/2019 CLINICAL DATA:  CABG 2 days ago. EXAM: CHEST  1 VIEW COMPARISON:  One-view chest x-ray 05/02/2019 FINDINGS: The heart is enlarged, exaggerated by low lung volumes. Swan-Ganz catheter was removed. Mediastinal drains remain in place. Right-sided chest tube scratched at left-sided chest tube is stable. Mild bibasilar atelectasis is improving. Surgical clips are present at the right axilla. IMPRESSION: 1. Improving bibasilar atelectasis. 2. Interval removal of Swan-Ganz catheter. Electronically Signed   By: San Morelle M.D.   On: 05/03/2019 06:52   CT ANGIO NECK W OR WO CONTRAST  Result Date: 05/03/2019 CLINICAL DATA:  Multifocal stroke.  Vascular dissection. EXAM: CT ANGIOGRAPHY HEAD AND NECK TECHNIQUE: Multidetector CT imaging of the head and neck was performed using the standard protocol during bolus administration of intravenous contrast. Multiplanar CT image reconstructions and MIPs were obtained to evaluate the vascular anatomy. Carotid stenosis measurements (when applicable) are obtained utilizing NASCET criteria, using the distal internal carotid diameter as the denominator. CONTRAST:  72mL OMNIPAQUE  IOHEXOL 350 MG/ML SOLN COMPARISON:  MRI same day.  CT studies yesterday. FINDINGS: CTA NECK FINDINGS Aortic arch: The patient has had interval surgical repair of the aorta. Based on the start of the scan, dissection flap remains visible at the arch and proximal descending aorta. The ascending aorta is not included on this scan. As seen previously, the dissection involves also the innominate artery and left common carotid artery as well as the proximal left subclavian artery. Right carotid system: Dissection extension into the right common carotid artery appears the same, ending proximal to the bifurcation. The true lumen is larger than was seen yesterday. Dissection does not extend into the internal carotid artery. Left carotid system: As seen yesterday, the dissection extends throughout the left common carotid artery. The true lumen is mm or 2 larger than was seen yesterday. The dissection extends into the internal carotid artery as high as the C2 level but does not extend intracranial. Vertebral arteries: Both vertebral arteries  perfuse normally. Skeleton: Ordinary spondylosis. Other neck: Otherwise negative Upper chest: Postoperative changes as expected. Review of the MIP images confirms the above findings CTA HEAD FINDINGS Anterior circulation: Both internal carotid arteries are patent through the siphon regions. The anterior and middle cerebral vessels show flow. No proximal stenosis. No discernible large vessel occlusion presently. Some luxury perfusion in the region of the left operculum ir infarction. Posterior circulation: Both vertebral arteries widely patent to the basilar. No basilar stenosis. Posterior circulation branch vessels are normal. Primary fetal origin of both posterior cerebral arteries. Venous sinuses: Patent and normal. Anatomic variants: None other significant. Review of the MIP images confirms the above findings IMPRESSION: Interval aortic repair, not primarily or completely evaluated.  Dissection again extensions into both carotid systems but does not appear to have propagated any further. In fact, true luminal size of the common carotid regions is improved. No intracranial large vessel occlusion demonstrable presently. Small region of luxury perfusion related to the left frontal operculum infarction. Electronically Signed   By: Paulina Fusi M.D.   On: 05/03/2019 00:15   MR BRAIN WO CONTRAST  Result Date: 05/02/2019 CLINICAL DATA:  Right facial droop and speech disturbance with right arm weakness yesterday. Follow-up. Vascular dissection. Left frontal stroke. EXAM: MRI HEAD WITHOUT CONTRAST TECHNIQUE: Multiplanar, multiecho pulse sequences of the brain and surrounding structures were obtained without intravenous contrast. COMPARISON:  CT studies done yesterday. FINDINGS: Brain: Diffusion imaging shows a 3.5-4 cm region of acute infarction in the left frontal operculum as previously seen. There is additionally a 2 cm acute infarction at the left parietooccipital junction, a punctate acute infarction in the left occipital lobe, and a subcentimeter acute infarction in the right frontal lobe. 1 cm cortical infarction at the left posterior frontal vertex, probably the precentral gyrus. Few other punctate foci in the left hemisphere. No evidence of mass effect or hematoma. Minimal petechial blood products in the largest left frontal operculum stroke. Elsewhere, the brain appears normal. No pre-existing vascular insults. No hydrocephalus or extra-axial collection. Vascular: Major vessels at the base of the brain show flow. Skull and upper cervical spine: Negative Sinuses/Orbits: Sinuses are clear. Tiny left mastoid effusion. Orbits negative. Other: None IMPRESSION: Scattered areas of acute infarction in both hemispheres, more extensive on the left than the right, consistent with embolic infarctions. The largest area of involvement is a 3.5-4 cm stroke in the left frontal operculum, with mild swelling  and minimal petechial blood products. Scattered other strokes in the left hemisphere, next largest measuring about 2 cm at the left parietooccipital junction. Single acute infarction in the right hemisphere measuring less than a cm in size in the right frontal lobe. No brainstem or cerebellar stroke. Electronically Signed   By: Paulina Fusi M.D.   On: 05/02/2019 22:22   DG Abd Portable 1V  Result Date: 05/02/2019 CLINICAL DATA:  Screening for metal prior to MRI EXAM: PORTABLE ABDOMEN - 1 VIEW COMPARISON:  None. FINDINGS: Mild gaseous distension of bowel in the left abdomen, both large and small bowel, favor focal ileus. No radiopaque foreign bodies. No organomegaly or free air. IMPRESSION: No metallic/radiopaque foreign body. Electronically Signed   By: Charlett Nose M.D.   On: 05/02/2019 16:27   DG Swallowing Func-Speech Pathology  Result Date: 05/03/2019 Objective Swallowing Evaluation: Type of Study: MBS-Modified Barium Swallow Study  Patient Details Name: KLINE BULTHUIS MRN: [id number] Date of Birth: 1965/07/30 Today's Date: 05/03/2019 Time: SLP Start Time (ACUTE ONLY): 1040 -SLP Stop Time (ACUTE ONLY):  1100 SLP Time Calculation (min) (ACUTE ONLY): 20 min Past Medical History: Past Medical History: Diagnosis Date . Hyperlipidemia  . Hypertension  Past Surgical History: Past Surgical History: Procedure Laterality Date . ACHILLES TENDON SURGERY Right 11/10/2016  Procedure: ACHILLES TENDON REPAIR VIA SPEED BRIDGE;  Surgeon: [name], DPM;  Location: [facility];  Service: Podiatry;  Laterality: Right; . HEEL SPUR RESECTION Right 11/10/2016  Procedure: HEEL SPUR RESECTION (HAGLUNDS DEFORMITY);  Surgeon: [name], DPM;  Location: [facility];  Service: Podiatry;  Laterality: Right; . REPAIR OF ACUTE ASCENDING THORACIC AORTIC DISSECTION N/A 05/01/2019  Procedure: REPAIR OF ACUTE ASCENDING THORACIC AORTIC DISSECTION USING HEMOSHIELD GRAFT SIZE ;  Surgeon: Linden Dolin, MD;  Location: [facility] OR;  Service:  Cardiothoracic;  Laterality: N/A; HPI: KALEI MCKILLOP is a 54 y.o. male with history of hypertension, hyperlipidemia.  Patient apparently was seen in the emergency department on 05/01/2019 for right facial droop and inability to speak along with right arm weakness. Currently presnts with expressive aphasia. No h/o swallowing difficulties.  Subjective: alert, cooperative Assessment / Plan / Recommendation CHL IP CLINICAL IMPRESSIONS 05/03/2019 Clinical Impression Pt was seen in radiology suite for modified barium swallow study. Trials of puree solids, dysphagia 2 solids, regular texture solids, thin liquids, and nectar thick liquids via cup and straw were administered. Pt presents with moderate oropharyngeal dysphagia characterized by impaired bolus control, prolonged mastication, a pharyngeal delay, and reduced anterior laryngeal movement. Bolus manipulation was reduced during mastication and mastication was inadequately thorough. He demonstrated premature spillage to the level of valleculae and pyriform sinuses. The swallow was often triggered with the head of the bolus at the level of the pyriform sinuses with thin liquids and at the level of the valleculae with other consistencies. Aspiration (PAS 7) was noted with thin liquids after the swallow and was likely due to residue in the pyriform sinuses. However, this cannot be conclusively stated since the aspiration event occurred between swallows when the fluoro was off. Coughing was noted throughout the study even in the absence of laryngeal invasion but coughing was ineffective in expelling the aspirant. A puree diet with nectar thick liquids may be started at this time with strict observance of swallowing precautions. However, considering the level of fatigue that he demonstrated and endorsed having at the end of the study, his endurance during meals is questioned and supplemental non-oral nutrition may therefore be needed. SLP will follow to assess diet tolerance and  for treatment.  SLP Visit Diagnosis Dysphagia, unspecified (R13.10) Attention and concentration deficit following -- Frontal lobe and executive function deficit following -- Impact on safety and function Mild aspiration risk;Moderate aspiration risk   CHL IP TREATMENT RECOMMENDATION 05/03/2019 Treatment Recommendations Therapy as outlined in treatment plan below   Prognosis 05/03/2019 Prognosis for Safe Diet Advancement Good Barriers to Reach Goals Language deficits;Severity of deficits Barriers/Prognosis Comment -- CHL IP DIET RECOMMENDATION 05/03/2019 SLP Diet Recommendations Dysphagia 1 (Puree) solids;Nectar thick liquid Liquid Administration via Cup;Straw Medication Administration Crushed with puree Compensations Slow rate;Small sips/bites Postural Changes Remain semi-upright after after feeds/meals (Comment);Seated upright at 90 degrees   CHL IP OTHER RECOMMENDATIONS 05/03/2019 Recommended Consults -- Oral Care Recommendations Oral care BID;Oral care before and after PO Other Recommendations Have oral suction available   CHL IP FOLLOW UP RECOMMENDATIONS 05/03/2019 Follow up Recommendations (No Data)   CHL IP FREQUENCY AND DURATION 05/03/2019 Speech Therapy Frequency (ACUTE ONLY) min 2x/week Treatment Duration 2 weeks      CHL IP ORAL PHASE 05/03/2019 Oral  Phase Impaired Oral - Pudding Teaspoon -- Oral - Pudding Cup -- Oral - Honey Teaspoon -- Oral - Honey Cup -- Oral - Nectar Teaspoon -- Oral - Nectar Cup Premature spillage;Decreased bolus cohesion;Delayed oral transit Oral - Nectar Straw Premature spillage;Decreased bolus cohesion;Delayed oral transit Oral - Thin Teaspoon -- Oral - Thin Cup Premature spillage;Decreased bolus cohesion;Delayed oral transit Oral - Thin Straw -- Oral - Puree Decreased bolus cohesion;Delayed oral transit;Piecemeal swallowing Oral - Mech Soft Impaired mastication;Delayed oral transit;Weak lingual manipulation Oral - Regular Impaired mastication;Decreased bolus cohesion Oral - Multi-Consistency --  Oral - Pill -- Oral Phase - Comment --  CHL IP PHARYNGEAL PHASE 05/03/2019 Pharyngeal Phase Impaired Pharyngeal- Pudding Teaspoon -- Pharyngeal -- Pharyngeal- Pudding Cup -- Pharyngeal -- Pharyngeal- Honey Teaspoon -- Pharyngeal -- Pharyngeal- Honey Cup -- Pharyngeal -- Pharyngeal- Nectar Teaspoon -- Pharyngeal -- Pharyngeal- Nectar Cup -- Pharyngeal -- Pharyngeal- Nectar Straw Delayed swallow initiation-vallecula;Pharyngeal residue - pyriform Pharyngeal -- Pharyngeal- Thin Teaspoon -- Pharyngeal -- Pharyngeal- Thin Cup Delayed swallow initiation-pyriform sinuses;Reduced anterior laryngeal mobility Pharyngeal -- Pharyngeal- Thin Straw Delayed swallow initiation-pyriform sinuses;Penetration/Apiration after swallow;Reduced anterior laryngeal mobility Pharyngeal Material enters airway, passes BELOW cords and not ejected out despite cough attempt by patient Pharyngeal- Puree Reduced anterior laryngeal mobility Pharyngeal -- Pharyngeal- Mechanical Soft Reduced anterior laryngeal mobility Pharyngeal -- Pharyngeal- Regular -- Pharyngeal -- Pharyngeal- Multi-consistency -- Pharyngeal -- Pharyngeal- Pill -- Pharyngeal -- Pharyngeal Comment --  CHL IP CERVICAL ESOPHAGEAL PHASE 05/03/2019 Cervical Esophageal Phase WFL Pudding Teaspoon -- Pudding Cup -- Honey Teaspoon -- Honey Cup -- Nectar Teaspoon -- Nectar Cup -- Nectar Straw -- Thin Teaspoon -- Thin Cup -- Thin Straw -- Puree -- Mechanical Soft -- Regular -- Multi-consistency -- Pill -- Cervical Esophageal Comment -- Shanika I. Vear ClockPhillips, MS, CCC-SLP Acute Rehabilitation Services Office number [phone] Pager [phone] Scheryl MartenShanika I Phillips 05/03/2019, 12:23 PM               PHYSICAL EXAM Pleasant middle-aged male presently not in distress. . Afebrile. Head is nontraumatic. Neck is supple without bruit.    Cardiac exam no murmur or gallop. Lungs are clear to auscultation. Distal pulses are well felt. Midline chest surgical scar from surgery. Neurological Exam : Awake  alert.  Severe dysarthria with hypophonia and suspect some expressive aphasia as well.  Nods appropriately.  Unable to produce clear sounds.  Follows most commands.  Extraocular movements full range without nystagmus.  Blinks to threat bilaterally.  Face is symmetric.  Tongue midline motor system exam able to move all 4 extremities equally well against gravity without focal weakness.  Diminished fine finger movements on the right compared to the left.  Suspect mild right grip weakness though effort is variable sensation appears intact bilaterally.  Gait not tested ASSESSMENT/PLAN Mr. Leonides Grillsony V Mittman is a 54 y.o. male with history of HTN and HLD presenting 05/01/2019 with R facial droop, inability to speak and R arm weakness. CTA showed a L frontal operculum infarct as well as an aortic dissection involving B ICAs. Taken to OR for repair. Neuro consulted post op.   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   Therapy recommendations:  Recommend PT and OT evals when stable to be up  Disposition:  pending   Hypertension  Stable . From stroke perspective, Permissive hypertension (OK if < 220/120) but gradually normalize in 5-7 days . Long-term BP goal normotensive  Hyperlipidemia  Home meds:  mevacor 40, resumed in hospital as pravachol 40 (formulary change)  LDL pending, goal < 70  Continue statin at discharge  Dysphagia . Secondary to stroke . MBS cleared for D1 nectar thick liquids . Speech on board    Other Stroke Risk Factors  Cigarette smoker, advised to stop smoking  ETOH use, alcohol level <10, advised to drink no more than 2 drink(s) a day  Obesity, Body mass index is 34.03 kg/m., recommend weight loss, diet and exercise as appropriate   Other Active Problems    Hospital day # 2  Patient underwent emergent aortic dissection surgery and MRI scan shows bilateral embolic infarcts.  His neurological exam likely shows some aphasia and mild right-sided weakness though his speech may also have a contribution from his intubation and vocal cord swelling.  Recommend aspirin for stroke prevention if okay okay from surgical standpoint.  Continue ongoing stroke work-up.  Aggressive risk factor modification.  Discussed with patient and his wife at the bedside and answered questions.  Greater than 50% time during this 35-minute visit was spent on counseling and coordination of care and answering questions. Delia Heady, MD  To contact Stroke Continuity provider, please refer to [email or web address]. After hours, contact General Neurology

## 2019-05-03 NOTE — Progress Notes (Signed)
2 Days Post-Op Procedure(s) (LRB): REPAIR OF ACUTE ASCENDING THORACIC AORTIC DISSECTION USING HEMOSHIELD GRAFT SIZE (N/A) Subjective: No complaints  Objective: Vital signs in last 24 hours: Temp:  [96.8 F (36 C)-99.2 F (37.3 C)] 98.3 F (36.8 C) (03/04 1114) Pulse Rate:  [80-96] 93 (03/04 1400) Cardiac Rhythm: Normal sinus rhythm (03/04 1200) Resp:  [15-31] 22 (03/04 1400) BP: (122-147)/(65-82) 133/76 (03/04 1400) SpO2:  [92 %-100 %] 96 % (03/04 1400) Arterial Line BP: (115-180)/(50-77) 134/56 (03/04 1400) Weight:  [113.8 kg] 113.8 kg (03/04 0500)  Hemodynamic parameters for last 24 hours:    Intake/Output from previous day: 03/03 0701 - 03/04 0700 In: 936.5 [I.V.:386.2; IV Piggyback:550.3] Out: 3245 [Urine:2625; Chest Tube:620] Intake/Output this shift: Total I/O In: 451.2 [I.V.:99.9; IV Piggyback:351.3] Out: 650 [Urine:590; Chest Tube:60]  General appearance: alert and cooperative Neurologic: persistent aphasia Heart: regular rate and rhythm, S1, S2 normal, no murmur, click, rub or gallop Lungs: clear to auscultation bilaterally Abdomen: soft, non-tender; bowel sounds normal; no masses,  no organomegaly Extremities: extremities normal, atraumatic, no cyanosis or edema Wound: dressed, dry  Lab Results: Recent Labs    05/02/19 1624 05/03/19 0435  WBC 18.9* 16.3*  HGB 11.1* 11.1*  HCT 34.4* 33.6*  PLT 221 197   BMET:  Recent Labs    05/02/19 1624 05/03/19 0435  NA 136 138  K 3.8 3.7  CL 106 106  CO2 23 25  GLUCOSE 145* 135*  BUN 10 7  CREATININE 0.71 0.59*  CALCIUM 8.4* 8.6*    PT/INR:  Recent Labs    05/01/19 1716  LABPROT 15.6*  INR 1.3*   ABG    Component Value Date/Time   PHART 7.441 05/02/2019 0706   HCO3 23.1 05/02/2019 0706   TCO2 24 05/02/2019 0706   ACIDBASEDEF 1.0 05/02/2019 0409   O2SAT 96.0 05/02/2019 0706   CBG (last 3)  Recent Labs    05/03/19 0434 05/03/19 0839 05/03/19 1115  GLUCAP 125* 124* 143*     Assessment/Plan: S/P Procedure(s) (LRB): REPAIR OF ACUTE ASCENDING THORACIC AORTIC DISSECTION USING HEMOSHIELD GRAFT SIZE (N/A) Mobilize PT/OT   LOS: 2 days    Linden Dolin 05/03/2019

## 2019-05-03 NOTE — Plan of Care (Signed)
  Problem: Clinical Measurements: Goal: Ability to maintain clinical measurements within normal limits will improve Outcome: Progressing Goal: Respiratory complications will improve Outcome: Progressing   Problem: Pain Managment: Goal: General experience of comfort will improve Outcome: Progressing   Problem: Coping: Goal: Will identify appropriate support needs Outcome: Progressing Note: Spiritual care consult placed yesterday.   Problem: Nutrition: Goal: Adequate nutrition will be maintained Outcome: Not Progressing Note: Waiting on swallow study to be completed.

## 2019-05-04 ENCOUNTER — Inpatient Hospital Stay (HOSPITAL_COMMUNITY): Payer: 59

## 2019-05-04 DIAGNOSIS — I639 Cerebral infarction, unspecified: Secondary | ICD-10-CM

## 2019-05-04 DIAGNOSIS — Z720 Tobacco use: Secondary | ICD-10-CM

## 2019-05-04 DIAGNOSIS — E785 Hyperlipidemia, unspecified: Secondary | ICD-10-CM

## 2019-05-04 DIAGNOSIS — I10 Essential (primary) hypertension: Secondary | ICD-10-CM

## 2019-05-04 DIAGNOSIS — D72829 Elevated white blood cell count, unspecified: Secondary | ICD-10-CM

## 2019-05-04 DIAGNOSIS — R0682 Tachypnea, not elsewhere classified: Secondary | ICD-10-CM

## 2019-05-04 LAB — LIPID PANEL
Cholesterol: 148 mg/dL (ref 0–200)
HDL: 38 mg/dL — ABNORMAL LOW (ref >40)
LDL Cholesterol: 88 mg/dL (ref 0–99)
Total CHOL/HDL Ratio: 3.9 RATIO
Triglycerides: 112 mg/dL (ref <150)
VLDL: 22 mg/dL (ref 0–40)

## 2019-05-04 LAB — MAGNESIUM
Magnesium: 2.4 mg/dL (ref 1.7–2.4)
Magnesium: 2.2 mg/dL (ref 1.7–2.4)
Magnesium: 2.4 mg/dL (ref 1.7–2.4)

## 2019-05-04 LAB — BASIC METABOLIC PANEL
Anion gap: 10 (ref 5–15)
BUN: 15 mg/dL (ref 6–20)
CO2: 23 mmol/L (ref 22–32)
Calcium: 8.7 mg/dL — ABNORMAL LOW (ref 8.9–10.3)
Chloride: 107 mmol/L (ref 98–111)
Creatinine, Ser: 0.69 mg/dL (ref 0.61–1.24)
GFR calc Af Amer: >60 mL/min (ref >60)
GFR calc non Af Amer: >60 mL/min (ref >60)
Glucose, Bld: 109 mg/dL — ABNORMAL HIGH (ref 70–99)
Potassium: 3.7 mmol/L (ref 3.5–5.1)
Sodium: 140 mmol/L (ref 135–145)

## 2019-05-04 LAB — CBC
HCT: 33.1 % — ABNORMAL LOW (ref 39.0–52.0)
Hemoglobin: 10.9 g/dL — ABNORMAL LOW (ref 13.0–17.0)
MCH: 28.7 pg (ref 26.0–34.0)
MCHC: 32.9 g/dL (ref 30.0–36.0)
MCV: 87.1 fL (ref 80.0–100.0)
Platelets: 224 10*3/uL (ref 150–400)
RBC: 3.8 MIL/uL — ABNORMAL LOW (ref 4.22–5.81)
RDW: 16.6 % — ABNORMAL HIGH (ref 11.5–15.5)
WBC: 12.5 10*3/uL — ABNORMAL HIGH (ref 4.0–10.5)
nRBC: 0 % (ref 0.0–0.2)

## 2019-05-04 LAB — GLUCOSE, CAPILLARY
Glucose-Capillary: 102 mg/dL — ABNORMAL HIGH (ref 70–99)
Glucose-Capillary: 103 mg/dL — ABNORMAL HIGH (ref 70–99)
Glucose-Capillary: 116 mg/dL — ABNORMAL HIGH (ref 70–99)
Glucose-Capillary: 124 mg/dL — ABNORMAL HIGH (ref 70–99)
Glucose-Capillary: 128 mg/dL — ABNORMAL HIGH (ref 70–99)
Glucose-Capillary: 97 mg/dL (ref 70–99)
Glucose-Capillary: 98 mg/dL (ref 70–99)

## 2019-05-04 LAB — HEMOGLOBIN A1C
Hgb A1c MFr Bld: 5.6 % (ref 4.8–5.6)
Mean Plasma Glucose: 114.02 mg/dL

## 2019-05-04 LAB — PHOSPHORUS
Phosphorus: 3.1 mg/dL (ref 2.5–4.6)
Phosphorus: 4.1 mg/dL (ref 2.5–4.6)

## 2019-05-04 LAB — TRIGLYCERIDES: Triglycerides: 130 mg/dL (ref <150)

## 2019-05-04 MED ORDER — ROSUVASTATIN CALCIUM 20 MG PO TABS
20.0000 mg | ORAL_TABLET | Freq: Every day | ORAL | Status: DC
  Administered 2019-05-04 – 2019-05-08 (×5): 20 mg via ORAL
  Filled 2019-05-04 (×5): qty 1

## 2019-05-04 MED ORDER — POTASSIUM CHLORIDE CRYS ER 20 MEQ PO TBCR
20.0000 meq | EXTENDED_RELEASE_TABLET | ORAL | Status: AC
  Administered 2019-05-04 (×2): 20 meq via ORAL
  Filled 2019-05-04 (×3): qty 1

## 2019-05-04 MED ORDER — COLCHICINE 0.6 MG PO TABS
0.6000 mg | ORAL_TABLET | Freq: Two times a day (BID) | ORAL | Status: DC
  Administered 2019-05-04 – 2019-05-09 (×10): 0.6 mg via ORAL
  Filled 2019-05-04 (×11): qty 1

## 2019-05-04 MED ORDER — PRO-STAT SUGAR FREE PO LIQD
30.0000 mL | Freq: Two times a day (BID) | ORAL | Status: DC
  Administered 2019-05-04 – 2019-05-09 (×11): 30 mL
  Filled 2019-05-04 (×11): qty 30

## 2019-05-04 MED ORDER — OSMOLITE 1.5 CAL PO LIQD
1000.0000 mL | ORAL | Status: DC
  Administered 2019-05-05 – 2019-05-07 (×3): 1000 mL
  Filled 2019-05-04 (×6): qty 1000

## 2019-05-04 MED ORDER — LEVETIRACETAM ER 500 MG PO TB24: 500.0000 mg | ORAL_TABLET | Freq: Every day | ORAL | Status: DC

## 2019-05-04 MED ORDER — FUROSEMIDE 10 MG/ML IJ SOLN
60.0000 mg | Freq: Every day | INTRAMUSCULAR | Status: DC
  Administered 2019-05-04 – 2019-05-05 (×2): 60 mg via INTRAVENOUS
  Filled 2019-05-04 (×2): qty 6

## 2019-05-04 MED FILL — Heparin Sodium (Porcine) Inj 1000 Unit/ML: INTRAMUSCULAR | Qty: 30 | Status: AC

## 2019-05-04 MED FILL — Potassium Chloride Inj 2 mEq/ML: INTRAVENOUS | Qty: 40 | Status: AC

## 2019-05-04 MED FILL — Magnesium Sulfate Inj 50%: INTRAMUSCULAR | Qty: 10 | Status: AC

## 2019-05-04 NOTE — Progress Notes (Signed)
CT surgery p.m. Rounds  Resting comfortably in bed with stable blood pressure off drips We will DC A-line Patient evaluated for swallowing function because of significant aphasia.  Dysphagia 1 diet recommended  Pulmonary status stable

## 2019-05-04 NOTE — Progress Notes (Signed)
  Speech Language Pathology Treatment: Dysphagia;Cognitive-Linquistic  Patient Details Name: Erik Parker MRN: [id number] DOB: 07-Dec-1965 Today's Date: 05/04/2019 Time: 1212-1240 SLP Time Calculation (min) (ACUTE ONLY): 28 min  Assessment / Plan / Recommendation Clinical Impression  Pt was seen for treatment was cooperative throughout the session. He intermittently became tearful during attempts at verbalizing but was easily consoled by SLP's encouragement. He tolerated puree solids, nectar thick liquids, and small pills in apple sauce without overt s/sx of aspiration. Swallowing initiation appears improved compared to that which was noted during the modified barium swallow study and no significant oral residue was noted. He was unable to produce automatic sequences or phrase completion tasks despite max cues. Articulatory imprecision was noted and negatively impacted speech intelligibility but he also appeared to have some difficulty with motor planning, possibly suggesting a component of apraxia of speech. He imitated /p/ with 70% accuracy increasing to 100% with repetition and visual placement cues. He approximated production of /p/(I) words with 40% accuracy given mod cues. He independently approximated "apple" when given the choice of having his smaller pills whole in apple sauce or with nectar thick liquids. He was educated regarding the use of a communication board to augment communication and he was able to accurately identify the correct images given various scenarios. SLP will continue to follow pt.     HPI HPI: Erik Parker is a 54 y.o. male with history of hypertension, hyperlipidemia.  Patient apparently was seen in the emergency department on 05/01/2019 for right facial droop and inability to speak along with right arm weakness. Currently presnts with expressive aphasia. No h/o swallowing difficulties.      SLP Plan  Continue with current plan of care       Recommendations  Diet  recommendations: Dysphagia 1 (puree);Nectar-thick liquid Liquids provided via: Cup;Straw Medication Administration: Whole meds with puree(Crush larger pills) Supervision: Staff to assist with self feeding;Intermittent supervision to cue for compensatory strategies Compensations: Slow rate;Small sips/bites Postural Changes and/or Swallow Maneuvers: Seated upright 90 degrees                Oral Care Recommendations: Oral care BID Follow up Recommendations: Inpatient Rehab SLP Visit Diagnosis: Dysphagia, oropharyngeal phase (R13.12);Aphasia (R47.01) Plan: Continue with current plan of care       Vaiden Adames I. Vear Clock, MS, CCC-SLP Acute Rehabilitation Services Office number [phone] Pager [phone]       Scheryl Marten 05/04/2019, 2:35 PM

## 2019-05-04 NOTE — Progress Notes (Signed)
Inpatient Rehabilitation Admissions Coordinator  Inpatient rehab consult received. I will follow up on Monday with his progress to assist with planning dispo.  Ottie Glazier, RN, MSN Rehab Admissions Coordinator [phone] 05/04/2019 1:05 PM

## 2019-05-04 NOTE — Progress Notes (Signed)
Initial Nutrition Assessment  DOCUMENTATION CODES:   Not applicable  INTERVENTION:   Tube Feeding-Nocturnal x 14 hours:  Osmolite 1.5 at 70 ml/hr x 14 hours (1600 to 0600 daily) Pro-Stat 30 mL BID Provides 1670 kcals, 91 g of protein and 745 mL of free water Meets 80% calorie needs, 90% protein needs Plan to monitor po intake and adjust TF regimen as needed  Magic cup BID with meals, each supplement provides 290 kcal and 9 grams of protein   NUTRITION DIAGNOSIS:   Inadequate oral intake related to dysphagia, inability to eat, acute illness as evidenced by NPO status.  GOAL:   Patient will meet greater than or equal to 90% of their needs  Progressing  MONITOR:   TF tolerance, PO intake, Supplement acceptance, Labs, Weight trends  REASON FOR ASSESSMENT:   Rounds    ASSESSMENT:   54 yo male admitted with acute CVA, acute ascending aortic disection. PMH includes HLD, HTN, current smoker  RD working remotely.  3/02 Admit, Repair of aortic dissection 3/04 Diet advanced to Dysphagia I, Nectar  3/05 Cortrak placed  Off pressors, off nitro drip Pt is alert, aphasic  Unable to obtain diet and weight history from patient at this time  Noted possible plan for CIR, PT/OT recommending  Diet advanced yesterday but pt is not eating well. Ate 25% this AM for breakfast Plan for Cortrak today for supplemental nutrition  Current weight: 112.5 kg Admit weight: 115 kg I/O: net neutral  Labs: reviewed Meds: lasix, ss novolog, folic acid, KCL  Diet Order:   Diet Order            DIET - DYS 1 Room service appropriate? Yes with Assist; Fluid consistency: Nectar Thick  Diet effective now              EDUCATION NEEDS:   Not appropriate for education at this time  Skin:  Skin Assessment: Skin Integrity Issues: Skin Integrity Issues:: Incisions Incisions: chest  Last BM:  no documented BM  Height:   Ht Readings from Last 1 Encounters:  05/01/19 6' (1.829 m)     Weight:   Wt Readings from Last 1 Encounters:  05/04/19 112.5 kg    BMI:  Body mass index is 33.64 kg/m.  Estimated Nutritional Needs:   Kcal:  2000-2200 kcals  Protein:  100-120 g  Fluid:  >/= 2 L   Romelle Starcher MS, RDN, LDN, CNSC RD Pager Number and Weekend/On-Call After Hours Pager Located in [place]

## 2019-05-04 NOTE — Evaluation (Signed)
Occupational Therapy Evaluation Patient Details Name: Erik Parker MRN: [id number] DOB: 05-06-1965 Today's Date: 05/04/2019    History of Present Illness 54 y/o h/o HTN and HL. Presented [facility] on 3/2 with acute CVA. CTA head showed a left frontal operculum acute nonhemorrhagic infarct. Patient was noted to also have a dissection within the asending aorta on the lower cuts of the head/neck CT. Now s/p AAA.   Clinical Impression   Pt admitted with above diagnoses, PLOF limited to yes/no questions from pt due to expressive aphasia. Per report, pt was working and fully independent prior to this admission. At time of eval, pt able to complete sit <> stands with min A and cueing to maintain sternal precautions. He completed functional mobility with RW at min A to sink in bathroom to complete one standing grooming task. He needed x1 rest break with BSC pulled up behind him due to pain and decreased activity tolerance. He reports his LUE feels weaker and numb. Overall noted weakness, poor initiation and motor planning with pt. This improved with tactile cueing. Pt able to answer all yes/no questions appropriately and becoming visibility upset and tearful when trying to communicate and having difficulty. Given current status, recommend CIR at d/c for continued intensive neuro therapy services before d/c home. Will continue to follow per POC listed below.     Follow Up Recommendations  CIR;Supervision/Assistance - 24 hour    Equipment Recommendations  Other (comment)(TBD)    Recommendations for Other Services Rehab consult     Precautions / Restrictions Precautions Precautions: Sternal;Fall Precaution Comments: reviewed verbally and enforced use of pillow; expressive aphasia Restrictions Weight Bearing Restrictions: No Other Position/Activity Restrictions: sternal precautions      Mobility Bed Mobility Overal bed mobility: Needs Assistance Bed Mobility: Sit to Supine       Sit to supine:  Mod assist   General bed mobility comments: up in chair; mod A to guide BLEs back to bed  Transfers Overall transfer level: Needs assistance Equipment used: Rolling walker (2 wheeled) Transfers: Sit to/from Stand Sit to Stand: Min assist         General transfer comment: min A to rise and steady with RW, cues for maintaining sternal precautions    Balance Overall balance assessment: Needs assistance Sitting-balance support: No upper extremity supported;Feet unsupported Sitting balance-Leahy Scale: Good Sitting balance - Comments: minG at edge of bed   Standing balance support: Bilateral upper extremity supported;During functional activity Standing balance-Leahy Scale: Poor Standing balance comment: reliant on external support when standing at sink                           ADL either performed or assessed with clinical judgement   ADL Overall ADL's : Needs assistance/impaired Eating/Feeding: Supervision/ safety   Grooming: Minimal assistance;Sitting Grooming Details (indicate cue type and reason): at sink in bathroom to wash face. Pt needing seated rest break 2/2 sternal pain Upper Body Bathing: Moderate assistance;Sitting;Cueing for UE precautions   Lower Body Bathing: Moderate assistance;Maximal assistance;Sit to/from stand;Sitting/lateral leans   Upper Body Dressing : Moderate assistance;Sitting;Cueing for UE precautions   Lower Body Dressing: Moderate assistance;Maximal assistance;Sitting/lateral leans;Sit to/from stand   Toilet Transfer: Minimal assistance;BSC;RW Armed forces technical officer Details (indicate cue type and reason): cues for sternal precaution with transfer Toileting- Clothing Manipulation and Hygiene: Minimal assistance;Sit to/from stand   Tub/ Shower Transfer: Minimal assistance;Shower seat;Rolling walker;Moderate assistance   Functional mobility during ADLs: Minimal assistance;Cueing for safety;Cueing for  sequencing;Rolling walker General ADL  Comments: pt presents with language deficits, decreased motor planning, and overall poor activity tolerance with sternal pain     Vision Baseline Vision/History: No visual deficits Patient Visual Report: No change from baseline Vision Assessment?: No apparent visual deficits Additional Comments: WFL for assessment this date, will continue to assess     Perception     Praxis      Pertinent Vitals/Pain Pain Assessment: Faces Faces Pain Scale: Hurts little more Pain Location: chest Pain Descriptors / Indicators: Aching;Moaning Pain Intervention(s): Limited activity within patient's tolerance;Monitored during session;Repositioned     Hand Dominance Right   Extremity/Trunk Assessment Upper Extremity Assessment Upper Extremity Assessment: Generalized weakness;LUE deficits/detail LUE Deficits / Details: pt reports this hand feels weaker with strength testing; noted overall weakness with decreased motor initiation   Lower Extremity Assessment Lower Extremity Assessment: Defer to PT evaluation   Cervical / Trunk Assessment Cervical / Trunk Assessment: Normal   Communication Communication Communication: Expressive difficulties   Cognition Arousal/Alertness: Awake/alert Behavior During Therapy: WFL for tasks assessed/performed Overall Cognitive Status: Difficult to assess                                 General Comments: pt with significant expressive difficulties, but able to apporpriately follow commands and answer yes/no questions. Pt becoming tearful throughout session due to difficulty with communication. Attempted writing, but missing some letters.   General Comments  VSS on RA, pt reports some dizziness during standign at sink. Pt frustrated intermittently with communication deficits    Exercises     Shoulder Instructions      Home Living Family/patient expects to be discharged to:: Private residence Living Arrangements: Spouse/significant  other Available Help at Discharge: Family;Other (Comment)(wife works 4 hours a day) Type of Home: House Home Access: Stairs to enter [employer] of Steps: 5 Entrance Stairs-Rails: Can reach both Home Layout: One level     Bathroom Shower/Tub: Producer, television/film/video: Standard     Home Equipment: None          Prior Functioning/Environment Level of Independence: Independent        Comments: able to nod yes that he was working, but unable to state. Was fully independent prior        OT Problem List: Decreased strength;Decreased knowledge of use of DME or AE;Decreased range of motion;Decreased knowledge of precautions;Decreased activity tolerance;Decreased cognition;Impaired balance (sitting and/or standing);Pain;Impaired sensation      OT Treatment/Interventions: Self-care/ADL training;Therapeutic exercise;Patient/family education;Neuromuscular education;Balance training;Energy conservation;Therapeutic activities;DME and/or AE instruction;Cognitive remediation/compensation    OT Goals(Current goals can be found in the care plan section) Acute Rehab OT Goals Patient Stated Goal: return to independence OT Goal Formulation: With patient Time For Goal Achievement: 05/18/19 Potential to Achieve Goals: Good  OT Frequency: Min 2X/week   Barriers to D/C:            Co-evaluation PT/OT/SLP Co-Evaluation/Treatment: Yes Reason for Co-Treatment: For patient/therapist safety;To address functional/ADL transfers   OT goals addressed during session: ADL's and self-care;Proper use of Adaptive equipment and DME;Strengthening/ROM      AM-PAC OT "6 Clicks" Daily Activity     Outcome Measure Help from another person eating meals?: A Little Help from another person taking care of personal grooming?: A Little Help from another person toileting, which includes using toliet, bedpan, or urinal?: A Little Help from another person bathing (including washing, rinsing,  drying)?: A Lot  Help from another person to put on and taking off regular upper body clothing?: A Lot Help from another person to put on and taking off regular lower body clothing?: A Lot 6 Click Score: 15   End of Session Equipment Utilized During Treatment: Gait belt;Rolling walker Nurse Communication: Mobility status  Activity Tolerance: Patient tolerated treatment well Patient left: in bed;with call bell/phone within reach  OT Visit Diagnosis: Unsteadiness on feet (R26.81);Other abnormalities of gait and mobility (R26.89);Muscle weakness (generalized) (M62.81);Cognitive communication deficit (R41.841);Pain Symptoms and signs involving cognitive functions: Cerebral infarction Pain - part of body: (chest)                Time: 6734-1937 OT Time Calculation (min): 35 min Charges:  OT General Charges $OT Visit: 1 Visit OT Evaluation $OT Eval Moderate Complexity: 1 Mod  Dalphine Handing, MSOT, OTR/L Acute Rehabilitation Services [facility] Office Number: [phone] Pager: [phone]  Dalphine Handing 05/04/2019, 11:35 AM

## 2019-05-04 NOTE — Procedures (Signed)
Cortrak  Person Inserting Tube:  Renie Ora, RD Tube Type:  Cortrak - 43 inches Tube Location:  Left nare Initial Placement:  Stomach Secured by: Bridle Technique Used to Measure Tube Placement:  Documented cm marking at nare/ corner of mouth Cortrak Secured At:  62 cm Procedure Comments:  Cortrak Tube Team Note:  Consult received to place a Cortrak feeding tube.    X-ray is required, abdominal x-ray has been ordered by the Cortrak team. Please confirm tube placement before using the Cortrak tube.   If the tube becomes dislodged please keep the tube and contact the Cortrak team at [email or web address] (password [email or web address]) for replacement.  If after hours and replacement cannot be delayed, place a NG tube and confirm placement with an abdominal x-ray.     Trenton Gammon, MS, RD, LDN, CNSC Inpatient Clinical Dietitian RD pager # available in [facility]  After hours/weekend pager # available in [facility]

## 2019-05-04 NOTE — Plan of Care (Signed)
  Problem: Education: Goal: Knowledge of General Education information will improve Description: Including pain rating scale, medication(s)/side effects and non-pharmacologic comfort measures Outcome: Progressing   Problem: Health Behavior/Discharge Planning: Goal: Ability to manage health-related needs will improve Outcome: Progressing   Problem: Clinical Measurements: Goal: Ability to maintain clinical measurements within normal limits will improve Outcome: Progressing Goal: Will remain free from infection Outcome: Progressing Goal: Diagnostic test results will improve Outcome: Progressing Goal: Respiratory complications will improve Outcome: Progressing Goal: Cardiovascular complication will be avoided Outcome: Progressing   Problem: Activity: Goal: Risk for activity intolerance will decrease Outcome: Progressing   Problem: Nutrition: Goal: Adequate nutrition will be maintained Outcome: Progressing   Problem: Coping: Goal: Level of anxiety will decrease Outcome: Progressing   Problem: Elimination: Goal: Will not experience complications related to bowel motility Outcome: Progressing Goal: Will not experience complications related to urinary retention Outcome: Progressing   Problem: Pain Managment: Goal: General experience of comfort will improve Outcome: Progressing   Problem: Safety: Goal: Ability to remain free from injury will improve Outcome: Progressing   Problem: Skin Integrity: Goal: Risk for impaired skin integrity will decrease Outcome: Progressing   Problem: Education: Goal: Knowledge of disease or condition will improve Outcome: Progressing Goal: Knowledge of secondary prevention will improve Outcome: Progressing Goal: Knowledge of patient specific risk factors addressed and post discharge goals established will improve Outcome: Progressing   Problem: Coping: Goal: Will identify appropriate support needs Outcome: Progressing   Problem:  Health Behavior/Discharge Planning: Goal: Ability to manage health-related needs will improve Outcome: Progressing   Problem: Self-Care: Goal: Ability to participate in self-care as condition permits will improve Outcome: Progressing Goal: Verbalization of feelings and concerns over difficulty with self-care will improve Outcome: Progressing Goal: Ability to communicate needs accurately will improve Outcome: Progressing   Problem: Nutrition: Goal: Risk of aspiration will decrease Outcome: Progressing Goal: Dietary intake will improve Outcome: Progressing   

## 2019-05-04 NOTE — Progress Notes (Signed)
Advanced Heart Failure Rounding Note   Subjective:     Sitting up in chair. No further VT. Remains aphasic but denies CP. Appears more comfortable.   Follows commands  Objective:   Weight Range:  Vital Signs:   Temp:  [98.1 F (36.7 C)-99.1 F (37.3 C)] 98.7 F (37.1 C) (03/05 0756) Pulse Rate:  [75-98] 81 (03/05 0700) Resp:  [13-31] 21 (03/05 0700) BP: (93-136)/(66-93) 105/68 (03/05 0700) SpO2:  [93 %-99 %] 96 % (03/05 0700) Arterial Line BP: (102-163)/(53-77) 113/55 (03/05 0645) Weight:  [112.5 kg] 112.5 kg (03/05 0500)    Weight change: Filed Weights   05/02/19 0500 05/03/19 0500 05/04/19 0500  Weight: 117.1 kg 113.8 kg 112.5 kg    Intake/Output:   Intake/Output Summary (Last 24 hours) at 05/04/2019 0926 Last data filed at 05/04/2019 0800 Gross per 24 hour  Intake 879.98 ml  Output 1300 ml  Net -420.02 ml     Physical Exam: General:  Sitting in chair No resp difficulty HEENT: normal Neck: supple. JVP to jaw . Carotids 2+ bilat; no bruits. No lymphadenopathy or thryomegaly appreciated. Cor: sternal wound ok PMI nondisplaced. Regular rate & rhythm. No rubs, gallops or murmurs. + CT Lungs: coarse Abdomen: soft, nontender, nondistended. No hepatosplenomegaly. No bruits or masses. Good bowel sounds. Extremities: no cyanosis, clubbing, rash, 1+ edema Neuro: alert follows commands. Moves all 4  Telemetry: NSR 80-90s no VT Personally reviewed   Labs: Basic Metabolic Panel: Recent Labs  Lab 04/30/19 1257 05/01/19 0700 05/01/19 2229 05/02/19 0032 05/02/19 0405 05/02/19 0405 05/02/19 0409 05/02/19 0706 05/02/19 1624 05/02/19 1624 05/03/19 0435 05/03/19 1800 05/04/19 0621  NA 138   < > 139   < > 137  --    < > 140 136  --  138 137 140  K 3.7   < > 4.5   < > 3.9  --    < > 3.8 3.8  --  3.7 3.7 3.7  CL 106   < > 111   < > 107  --   --   --  106  --  106 105 107  CO2 22   < > 20*   < > 21*  --   --   --  23  --  25 22 23   GLUCOSE 123*   < > 156*   < >  123*  --   --   --  145*  --  135* 132* 109*  BUN 18   < > 17   < > 13  --   --   --  10  --  7 9 15   CREATININE 0.80   < > 0.76   < > 0.71  --   --   --  0.71  --  0.59* 0.73 0.69  CALCIUM 9.4   < > 8.5*   < > 8.5*   < >  --   --  8.4*   < > 8.6* 8.6* 8.7*  MG 1.8  --  2.6*  --  2.1  --   --   --  2.1  --   --   --  2.4   < > = values in this interval not displayed.    Liver Function Tests: Recent Labs  Lab 04/30/19 1257 05/01/19 0700  AST 20 15  ALT 33 27  ALKPHOS 52 47  BILITOT 0.6 0.9  PROT 7.2 6.9  ALBUMIN 4.3 3.9   Recent Labs  Lab 04/30/19 1432  LIPASE 47   No results for input(s): AMMONIA in the last 168 hours.  CBC: Recent Labs  Lab 04/30/19 1257 04/30/19 1257 05/01/19 0700 05/01/19 0716 05/01/19 2229 05/02/19 0032 05/02/19 0405 05/02/19 0405 05/02/19 0409 05/02/19 0706 05/02/19 1624 05/03/19 0435 05/04/19 0621  WBC 15.6*   < > 16.2*   < > 18.3*  --  16.3*  --   --   --  18.9* 16.3* 12.5*  NEUTROABS 11.1*  --  10.4*  --   --   --   --   --   --   --   --   --   --   HGB 14.9   < > 13.9   < > 11.7*   < > 10.9*   < > 11.2* 11.6* 11.1* 11.1* 10.9*  HCT 45.4   < > 42.7   < > 35.9*   < > 33.8*   < > 33.0* 34.0* 34.4* 33.6* 33.1*  MCV 87.5   < > 87.5   < > 87.6  --  88.5  --   --   --  87.5 87.5 87.1  PLT 307   < > 281   < > 211  --  195  --   --   --  221 197 224   < > = values in this interval not displayed.    Cardiac Enzymes: No results for input(s): CKTOTAL, CKMB, CKMBINDEX, TROPONINI in the last 168 hours.  BNP: BNP (last 3 results) Recent Labs    04/30/19 1257  BNP 31.0    ProBNP (last 3 results) No results for input(s): PROBNP in the last 8760 hours.    Other results:  Imaging: CT ANGIO HEAD W OR WO CONTRAST  Result Date: 05/03/2019 CLINICAL DATA:  Multifocal stroke.  Vascular dissection. EXAM: CT ANGIOGRAPHY HEAD AND NECK TECHNIQUE: Multidetector CT imaging of the head and neck was performed using the standard protocol during bolus  administration of intravenous contrast. Multiplanar CT image reconstructions and MIPs were obtained to evaluate the vascular anatomy. Carotid stenosis measurements (when applicable) are obtained utilizing NASCET criteria, using the distal internal carotid diameter as the denominator. CONTRAST:  76mL OMNIPAQUE IOHEXOL 350 MG/ML SOLN COMPARISON:  MRI same day.  CT studies yesterday. FINDINGS: CTA NECK FINDINGS Aortic arch: The patient has had interval surgical repair of the aorta. Based on the start of the scan, dissection flap remains visible at the arch and proximal descending aorta. The ascending aorta is not included on this scan. As seen previously, the dissection involves also the innominate artery and left common carotid artery as well as the proximal left subclavian artery. Right carotid system: Dissection extension into the right common carotid artery appears the same, ending proximal to the bifurcation. The true lumen is larger than was seen yesterday. Dissection does not extend into the internal carotid artery. Left carotid system: As seen yesterday, the dissection extends throughout the left common carotid artery. The true lumen is mm or 2 larger than was seen yesterday. The dissection extends into the internal carotid artery as high as the C2 level but does not extend intracranial. Vertebral arteries: Both vertebral arteries perfuse normally. Skeleton: Ordinary spondylosis. Other neck: Otherwise negative Upper chest: Postoperative changes as expected. Review of the MIP images confirms the above findings CTA HEAD FINDINGS Anterior circulation: Both internal carotid arteries are patent through the siphon regions. The anterior and middle cerebral vessels show flow. No proximal stenosis. No discernible large  vessel occlusion presently. Some luxury perfusion in the region of the left operculum ir infarction. Posterior circulation: Both vertebral arteries widely patent to the basilar. No basilar stenosis.  Posterior circulation branch vessels are normal. Primary fetal origin of both posterior cerebral arteries. Venous sinuses: Patent and normal. Anatomic variants: None other significant. Review of the MIP images confirms the above findings IMPRESSION: Interval aortic repair, not primarily or completely evaluated. Dissection again extensions into both carotid systems but does not appear to have propagated any further. In fact, true luminal size of the common carotid regions is improved. No intracranial large vessel occlusion demonstrable presently. Small region of luxury perfusion related to the left frontal operculum infarction. Electronically Signed   By: Paulina Fusi M.D.   On: 05/03/2019 00:15   DG Chest 1 View  Result Date: 05/03/2019 CLINICAL DATA:  CABG 2 days ago. EXAM: CHEST  1 VIEW COMPARISON:  One-view chest x-ray 05/02/2019 FINDINGS: The heart is enlarged, exaggerated by low lung volumes. Swan-Ganz catheter was removed. Mediastinal drains remain in place. Right-sided chest tube scratched at left-sided chest tube is stable. Mild bibasilar atelectasis is improving. Surgical clips are present at the right axilla. IMPRESSION: 1. Improving bibasilar atelectasis. 2. Interval removal of Swan-Ganz catheter. Electronically Signed   By: Marin Roberts M.D.   On: 05/03/2019 06:52   CT ANGIO NECK W OR WO CONTRAST  Result Date: 05/03/2019 CLINICAL DATA:  Multifocal stroke.  Vascular dissection. EXAM: CT ANGIOGRAPHY HEAD AND NECK TECHNIQUE: Multidetector CT imaging of the head and neck was performed using the standard protocol during bolus administration of intravenous contrast. Multiplanar CT image reconstructions and MIPs were obtained to evaluate the vascular anatomy. Carotid stenosis measurements (when applicable) are obtained utilizing NASCET criteria, using the distal internal carotid diameter as the denominator. CONTRAST:  22mL OMNIPAQUE IOHEXOL 350 MG/ML SOLN COMPARISON:  MRI same day.  CT studies  yesterday. FINDINGS: CTA NECK FINDINGS Aortic arch: The patient has had interval surgical repair of the aorta. Based on the start of the scan, dissection flap remains visible at the arch and proximal descending aorta. The ascending aorta is not included on this scan. As seen previously, the dissection involves also the innominate artery and left common carotid artery as well as the proximal left subclavian artery. Right carotid system: Dissection extension into the right common carotid artery appears the same, ending proximal to the bifurcation. The true lumen is larger than was seen yesterday. Dissection does not extend into the internal carotid artery. Left carotid system: As seen yesterday, the dissection extends throughout the left common carotid artery. The true lumen is mm or 2 larger than was seen yesterday. The dissection extends into the internal carotid artery as high as the C2 level but does not extend intracranial. Vertebral arteries: Both vertebral arteries perfuse normally. Skeleton: Ordinary spondylosis. Other neck: Otherwise negative Upper chest: Postoperative changes as expected. Review of the MIP images confirms the above findings CTA HEAD FINDINGS Anterior circulation: Both internal carotid arteries are patent through the siphon regions. The anterior and middle cerebral vessels show flow. No proximal stenosis. No discernible large vessel occlusion presently. Some luxury perfusion in the region of the left operculum ir infarction. Posterior circulation: Both vertebral arteries widely patent to the basilar. No basilar stenosis. Posterior circulation branch vessels are normal. Primary fetal origin of both posterior cerebral arteries. Venous sinuses: Patent and normal. Anatomic variants: None other significant. Review of the MIP images confirms the above findings IMPRESSION: Interval aortic repair, not primarily or  completely evaluated. Dissection again extensions into both carotid systems but does  not appear to have propagated any further. In fact, true luminal size of the common carotid regions is improved. No intracranial large vessel occlusion demonstrable presently. Small region of luxury perfusion related to the left frontal operculum infarction. Electronically Signed   By: Paulina Fusi M.D.   On: 05/03/2019 00:15   MR BRAIN WO CONTRAST  Result Date: 05/02/2019 CLINICAL DATA:  Right facial droop and speech disturbance with right arm weakness yesterday. Follow-up. Vascular dissection. Left frontal stroke. EXAM: MRI HEAD WITHOUT CONTRAST TECHNIQUE: Multiplanar, multiecho pulse sequences of the brain and surrounding structures were obtained without intravenous contrast. COMPARISON:  CT studies done yesterday. FINDINGS: Brain: Diffusion imaging shows a 3.5-4 cm region of acute infarction in the left frontal operculum as previously seen. There is additionally a 2 cm acute infarction at the left parietooccipital junction, a punctate acute infarction in the left occipital lobe, and a subcentimeter acute infarction in the right frontal lobe. 1 cm cortical infarction at the left posterior frontal vertex, probably the precentral gyrus. Few other punctate foci in the left hemisphere. No evidence of mass effect or hematoma. Minimal petechial blood products in the largest left frontal operculum stroke. Elsewhere, the brain appears normal. No pre-existing vascular insults. No hydrocephalus or extra-axial collection. Vascular: Major vessels at the base of the brain show flow. Skull and upper cervical spine: Negative Sinuses/Orbits: Sinuses are clear. Tiny left mastoid effusion. Orbits negative. Other: None IMPRESSION: Scattered areas of acute infarction in both hemispheres, more extensive on the left than the right, consistent with embolic infarctions. The largest area of involvement is a 3.5-4 cm stroke in the left frontal operculum, with mild swelling and minimal petechial blood products. Scattered other strokes  in the left hemisphere, next largest measuring about 2 cm at the left parietooccipital junction. Single acute infarction in the right hemisphere measuring less than a cm in size in the right frontal lobe. No brainstem or cerebellar stroke. Electronically Signed   By: Paulina Fusi M.D.   On: 05/02/2019 22:22   DG Chest Port 1 View  Result Date: 05/04/2019 CLINICAL DATA:  Status post cardiac surgery with chest soreness. EXAM: PORTABLE CHEST 1 VIEW COMPARISON:  05/02/2019 FINDINGS: Right IJ Cordis sheath is unchanged. Swan-Ganz catheter from an inferior approach terminates in the descending right pulmonary artery branch, similar. Mediastinal drain unchanged. Prior median sternotomy. Midline trachea. Cardiomegaly accentuated by AP portable technique. No pleural effusion or pneumothorax. Similar bibasilar atelectasis. No congestive failure. IMPRESSION: No significant change since one day prior. Cardiomegaly with low lung volumes and bibasilar atelectasis. No pneumothorax or other acute complication. Electronically Signed   By: Jeronimo Greaves M.D.   On: 05/04/2019 08:54   DG Abd Portable 1V  Result Date: 05/02/2019 CLINICAL DATA:  Screening for metal prior to MRI EXAM: PORTABLE ABDOMEN - 1 VIEW COMPARISON:  None. FINDINGS: Mild gaseous distension of bowel in the left abdomen, both large and small bowel, favor focal ileus. No radiopaque foreign bodies. No organomegaly or free air. IMPRESSION: No metallic/radiopaque foreign body. Electronically Signed   By: Charlett Nose M.D.   On: 05/02/2019 16:27   DG Swallowing Func-Speech Pathology  Result Date: 05/03/2019 Objective Swallowing Evaluation: Type of Study: MBS-Modified Barium Swallow Study  Patient Details Name: KENN REKOWSKI MRN: [id number] Date of Birth: [birth year] Today's Date: 05/03/2019 Time: SLP Start Time (ACUTE ONLY): 1040 -SLP Stop Time (ACUTE ONLY): 1100 SLP Time Calculation (min) (ACUTE ONLY): 20 min  Past Medical History: Past Medical History: Diagnosis Date   Hyperlipidemia   Hypertension  Past Surgical History: Past Surgical History: Procedure Laterality Date  ACHILLES TENDON SURGERY Right 11/10/2016  Procedure: ACHILLES TENDON REPAIR VIA SPEED BRIDGE;  Surgeon: [name], DPM;  Location: [facility];  Service: Podiatry;  Laterality: Right;  HEEL SPUR RESECTION Right 11/10/2016  Procedure: HEEL SPUR RESECTION (HAGLUNDS DEFORMITY);  Surgeon: [name], DPM;  Location: [facility];  Service: Podiatry;  Laterality: Right;  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 male with history of hypertension, hyperlipidemia.  Patient apparently was seen in the emergency department on 05/01/2019 for right facial droop and inability to speak along with right arm weakness. Currently presnts with expressive aphasia. No h/o swallowing difficulties.  Subjective: alert, cooperative Assessment / Plan / Recommendation CHL IP CLINICAL IMPRESSIONS 05/03/2019 Clinical Impression Pt was seen in radiology suite for modified barium swallow study. Trials of puree solids, dysphagia 2 solids, regular texture solids, thin liquids, and nectar thick liquids via cup and straw were administered. Pt presents with moderate oropharyngeal dysphagia characterized by impaired bolus control, prolonged mastication, a pharyngeal delay, and reduced anterior laryngeal movement. Bolus manipulation was reduced during mastication and mastication was inadequately thorough. He demonstrated premature spillage to the level of valleculae and pyriform sinuses. The swallow was often triggered with the head of the bolus at the level of the pyriform sinuses with thin liquids and at the level of the valleculae with other consistencies. Aspiration (PAS 7) was noted with thin  liquids after the swallow and was likely due to residue in the pyriform sinuses. However, this cannot be conclusively stated since the aspiration event occurred between swallows when the fluoro was off. Coughing was noted throughout the study even in the absence of laryngeal invasion but coughing was ineffective in expelling the aspirant. A puree diet with nectar thick liquids may be started at this time with strict observance of swallowing precautions. However, considering the level of fatigue that he demonstrated and endorsed having at the end of the study, his endurance during meals is questioned and supplemental non-oral nutrition may therefore be needed. SLP will follow to assess diet tolerance and for treatment.  SLP Visit Diagnosis Dysphagia, unspecified (R13.10) Attention and concentration deficit following -- Frontal lobe and executive function deficit following -- Impact on safety and function Mild aspiration risk;Moderate aspiration risk   CHL IP TREATMENT RECOMMENDATION 05/03/2019 Treatment Recommendations Therapy as outlined in treatment plan below   Prognosis 05/03/2019 Prognosis for Safe Diet Advancement Good Barriers to Reach Goals Language deficits;Severity of deficits Barriers/Prognosis Comment -- CHL IP DIET RECOMMENDATION 05/03/2019 SLP Diet Recommendations Dysphagia 1 (Puree) solids;Nectar thick liquid Liquid Administration via Cup;Straw Medication Administration Crushed with puree Compensations Slow rate;Small sips/bites Postural Changes Remain semi-upright after after feeds/meals (Comment);Seated upright at 90 degrees   CHL IP OTHER RECOMMENDATIONS 05/03/2019 Recommended Consults -- Oral Care Recommendations Oral care BID;Oral care before and after PO Other Recommendations Have oral suction available   CHL IP FOLLOW UP RECOMMENDATIONS 05/03/2019 Follow up Recommendations (No Data)   CHL IP FREQUENCY AND DURATION 05/03/2019 Speech Therapy Frequency (ACUTE ONLY) min 2x/week Treatment Duration 2 weeks       CHL IP ORAL PHASE 05/03/2019 Oral Phase Impaired Oral - Pudding Teaspoon -- Oral -  Pudding Cup -- Oral - Honey Teaspoon -- Oral - Honey Cup -- Oral - Nectar Teaspoon -- Oral - Nectar Cup Premature spillage;Decreased bolus cohesion;Delayed oral transit Oral - Nectar Straw Premature spillage;Decreased bolus cohesion;Delayed oral transit Oral - Thin Teaspoon -- Oral - Thin Cup Premature spillage;Decreased bolus cohesion;Delayed oral transit Oral - Thin Straw -- Oral - Puree Decreased bolus cohesion;Delayed oral transit;Piecemeal swallowing Oral - Mech Soft Impaired mastication;Delayed oral transit;Weak lingual manipulation Oral - Regular Impaired mastication;Decreased bolus cohesion Oral - Multi-Consistency -- Oral - Pill -- Oral Phase - Comment --  CHL IP PHARYNGEAL PHASE 05/03/2019 Pharyngeal Phase Impaired Pharyngeal- Pudding Teaspoon -- Pharyngeal -- Pharyngeal- Pudding Cup -- Pharyngeal -- Pharyngeal- Honey Teaspoon -- Pharyngeal -- Pharyngeal- Honey Cup -- Pharyngeal -- Pharyngeal- Nectar Teaspoon -- Pharyngeal -- Pharyngeal- Nectar Cup -- Pharyngeal -- Pharyngeal- Nectar Straw Delayed swallow initiation-vallecula;Pharyngeal residue - pyriform Pharyngeal -- Pharyngeal- Thin Teaspoon -- Pharyngeal -- Pharyngeal- Thin Cup Delayed swallow initiation-pyriform sinuses;Reduced anterior laryngeal mobility Pharyngeal -- Pharyngeal- Thin Straw Delayed swallow initiation-pyriform sinuses;Penetration/Apiration after swallow;Reduced anterior laryngeal mobility Pharyngeal Material enters airway, passes BELOW cords and not ejected out despite cough attempt by patient Pharyngeal- Puree Reduced anterior laryngeal mobility Pharyngeal -- Pharyngeal- Mechanical Soft Reduced anterior laryngeal mobility Pharyngeal -- Pharyngeal- Regular -- Pharyngeal -- Pharyngeal- Multi-consistency -- Pharyngeal -- Pharyngeal- Pill -- Pharyngeal -- Pharyngeal Comment --  CHL IP CERVICAL ESOPHAGEAL PHASE 05/03/2019 Cervical Esophageal Phase WFL Pudding  Teaspoon -- Pudding Cup -- Honey Teaspoon -- Honey Cup -- Nectar Teaspoon -- Nectar Cup -- Nectar Straw -- Thin Teaspoon -- Thin Cup -- Thin Straw -- Puree -- Mechanical Soft -- Regular -- Multi-consistency -- Pill -- Cervical Esophageal Comment -- Shanika I. Vear Clock, MS, CCC-SLP Acute Rehabilitation Services Office number [phone] Pager [phone] Scheryl Marten 05/03/2019, 12:23 PM              ECHOCARDIOGRAM LIMITED  Result Date: 05/03/2019    ECHOCARDIOGRAM LIMITED REPORT   Patient Name:   Erik Parker Date of Exam: 05/03/2019 Medical Rec #:  [id number]    Height:       72.0 in Accession #:    [id number]   Weight:       250.9 lb Date of Birth:  24-Jul-1965   BSA:          2.346 m Patient Age:    53 years     BP:           134/75 mmHg Patient Gender: M            HR:           83 bpm. Exam Location:  Inpatient Procedure: Limited Echo                      STAT ECHO Reported to: Dr Nicholes Mango on 05/03/2019 6:05:00 PM       Dr. Gala Romney reviewed echo at bedside. Indications:    Ventricular Tachycardia I47.2  History:        Patient has prior history of Echocardiogram examinations, most                 recent 05/01/2019. Risk Factors:Current Smoker. Aortic dissection.                 S/p ascending aortic replacement.  Sonographer:    Ross Ludwig RDCS (AE) Referring Phys: [id number] Sameka Bagent R Rane Blitch  Sonographer Comments: No subcostal window. Drainage bandages in place subcostally. Large bandage over sternum.  IMPRESSIONS  1. Prominent paradoxical septal motion and respiratory displacement of the intraventricular septum is seen. Left ventricular ejection fraction, by estimation, is 45 to 50%. The left ventricle has mildly decreased function. The left ventricle demonstrates global hypokinesis. There is mild concentric left ventricular hypertrophy. Left ventricular diastolic parameters were normal.  2. Right ventricular systolic function not well seen, probably mildly reduced. The right ventricular size is normal.  Tricuspid regurgitation signal is inadequate for assessing PA pressure.  3. The mitral valve is normal in structure and function. No evidence of mitral valve regurgitation.  4. Native aortic valve preserved/repaired. The aortic valve is tricuspid. Aortic valve regurgitation is not visualized. Aortic valve mean gradient measures 1.7 mmHg.  5. 28 mm tube graft seen in the ascending aorta, with intact anastomoses. Residual dissection is not identified (on limited aortic views). 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 FINDINGS  Left Ventricle: Prominent paradoxical septal motion and respiratory displacement of the intraventricular septum is seen. Left ventricular ejection fraction, by estimation, is 45 to 50%. The left ventricle has mildly decreased function. The left ventricle demonstrates global hypokinesis. The left ventricular internal cavity size was normal in size. There is mild concentric left ventricular hypertrophy. Abnormal (paradoxical) septal motion consistent with post-operative status. Indeterminate filling pressures. Right Ventricle: The right ventricular size is normal. Right ventricular systolic function not well seen, probably mildly reduced. Tricuspid regurgitation signal is inadequate for assessing PA pressure. Left Atrium: Left atrial size was normal in size. Right Atrium: Right atrial size was not well visualized. Pericardium: There is no evidence of pericardial effusion. Mitral Valve: The mitral valve is normal in structure and function. Tricuspid Valve: The tricuspid valve is not well visualized. Tricuspid valve regurgitation is not demonstrated. Aortic Valve: Native aortic valve preserved/repaired. The aortic valve is tricuspid. Aortic valve regurgitation is not visualized. Aortic valve mean gradient measures 1.7 mmHg. Aortic valve peak gradient measures 3.3 mmHg. Aortic valve area, by VTI measures 3.31 cm. Pulmonic Valve: The pulmonic valve was grossly normal. Pulmonic valve  regurgitation is not visualized. Aorta: 28 mm tube graft seen in the ascending aorta, with intact anastomoses. Residual dissection is not identified (on limited aortic views). 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  LEFT VENTRICLE PLAX 2D LVIDd:         4.30 cm  Diastology LVIDs:         3.70 cm  LV e' lateral:   7.51 cm/s LV PW:         1.37 cm  LV E/e' lateral: 10.1 LV IVS:        1.34 cm  LV e' medial:    8.49 cm/s LVOT diam:     2.00 cm  LV E/e' medial:  8.9 LV SV:         47 LV SV Index:   20 LVOT Area:     3.14 cm  LEFT ATRIUM         Index LA diam:    3.00 cm 1.28 cm/m  AORTIC VALVE AV Area (Vmax):    3.19 cm AV Area (Vmean):   3.01 cm AV Area (VTI):     3.31 cm AV Vmax:           90.37 cm/s AV Vmean:          60.400 cm/s AV VTI:            0.141 m AV Peak Grad:      3.3 mmHg AV Mean Grad:  1.7 mmHg LVOT Vmax:         91.90 cm/s LVOT Vmean:        57.900 cm/s LVOT VTI:          0.149 m LVOT/AV VTI ratio: 1.05  AORTA Ao Root diam: 3.90 cm MITRAL VALVE MV Area (PHT): 3.53 cm    SHUNTS MV Decel Time: 215 msec    Systemic VTI:  0.15 m MV E velocity: 75.80 cm/s  Systemic Diam: 2.00 cm MV A velocity: 47.10 cm/s MV E/A ratio:  1.61 Mihai Croitoru MD Electronically signed by Thurmon FairMihai Croitoru MD Signature Date/Time: 05/03/2019/7:29:59 PM    Final       Medications:     Scheduled Medications:  acetaminophen  1,000 mg Oral Q6H   Or   acetaminophen (TYLENOL) oral liquid 160 mg/5 mL  1,000 mg Per Tube Q6H   acetaminophen (TYLENOL) oral liquid 160 mg/5 mL  650 mg Per Tube Once   Or   acetaminophen  650 mg Rectal Once   aspirin EC  325 mg Oral Daily   Or   aspirin  324 mg Per Tube Daily   bisacodyl  10 mg Oral Daily   Or   bisacodyl  10 mg Rectal Daily   chlorhexidine  15 mL Mouth Rinse BID   Chlorhexidine Gluconate Cloth  6 each Topical Daily   colchicine  0.6 mg Oral Daily   docusate sodium  200 mg Oral Daily   folic acid  1 mg Oral Daily   furosemide  60  mg Intravenous Daily   insulin aspart  0-24 Units Subcutaneous Q4H   ketorolac  15 mg Intravenous Q6H   mouth rinse  15 mL Mouth Rinse q12n4p   metoprolol tartrate  5 mg Intravenous Q6H   pantoprazole (PROTONIX) IV  40 mg Intravenous Q24H   potassium chloride  20 mEq Oral Q4H   pravastatin  40 mg Oral q1800   sodium chloride flush  10-40 mL Intracatheter Q12H   sodium chloride flush  3 mL Intravenous Q12H     Infusions:  sodium chloride     sodium chloride Stopped (05/04/19 0539)   lactated ringers 10 mL/hr at 05/04/19 0800   niCARDipine     nitroGLYCERIN Stopped (05/03/19 1915)   phenylephrine (NEO-SYNEPHRINE) Adult infusion Stopped (05/02/19 0435)     PRN Medications:  sodium chloride, influenza vac split quadrivalent PF, morphine injection, ondansetron (ZOFRAN) IV, oxyCODONE, pneumococcal 23 valent vaccine, sodium chloride flush, sodium chloride flush, traMADol   Assessment/Plan:   1. Monomorphic VT - obviously the main question here is if this is ischemic-mediated or not as coronaries were not able to be evaluated in setting of acute dissection. On pre-op studies and in OR no evidence of RCA involvement - stat echo on 3/4 EF 45-50%  no regional wall motion abnormalities to suggest ACS - hstrop moderately elevated but flat in setting of heart surgery, Not c/w ACS - has not recurred  - will continue to follow on tele Keep K> 4.0 mg > 2.0 - change metoprolol to po carvedilol  - No evidence of ACS or ongoing ischemia. not good candidate for invasive eval with recent dissection -> [place] and acute CVA. Can consider cardiac CTA if HR acceptable and he can cooperate with testing  2. 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 - stable by echo  - no change.  - needs diuresis. Will give one dose IV lasix   3. Acute post-op pericarditis -  agree with colchicine  4. CVA, acute with severe aphasia - Neuro following - improving  slowly - Neuro and CIR following  5. HTN - Allowing for moderate permissive HTN in setting of recent CVA    Length of Stay: 3   Arvilla Meres MD 05/04/2019, 9:26 AM  Advanced Heart Failure Team Pager [phone] (M-F; 7a - 4p)  Please contact [facility] for night-coverage after hours (4p -7a ) and weekends on [email or web address]

## 2019-05-04 NOTE — Progress Notes (Signed)
STROKE TEAM PROGRESS NOTE   INTERVAL HISTORY Patient is sitting up in bed.  Continues to have severe expressive aphasia with dysarthria.  He makes guttural sounds but is unable to speak clear words or sentences.  He follows commands quite well.  Vital signs are stable.  He did pass barium swallow and is on dysphagia diet Vitals:   05/04/19 1000 05/04/19 1100 05/04/19 1200 05/04/19 1202  BP: 116/74 127/75    Pulse: 87 91 99   Resp: (!) 28 (!) 30 (!) 22   Temp:    98.1 F (36.7 C)  TempSrc:    Oral  SpO2: 94% 91% 93%   Weight:      Height:        CBC:  Recent Labs  Lab 04/30/19 1257 04/30/19 1257 05/01/19 0700 05/01/19 0716 05/03/19 0435 05/04/19 0621  WBC 15.6*   < > 16.2*   < > 16.3* 12.5*  NEUTROABS 11.1*  --  10.4*  --   --   --   HGB 14.9   < > 13.9   < > 11.1* 10.9*  HCT 45.4   < > 42.7   < > 33.6* 33.1*  MCV 87.5   < > 87.5   < > 87.5 87.1  PLT 307   < > 281   < > 197 224   < > = values in this interval not displayed.    Basic Metabolic Panel:  Recent Labs  Lab 05/02/19 1624 05/03/19 1800 05/04/19 0621 05/04/19 1234  NA  --  137 140  --   K  --  3.7 3.7  --   CL  --  105 107  --   CO2  --  22 23  --   GLUCOSE  --  132* 109*  --   BUN  --  9 15  --   CREATININE  --  0.73 0.69  --   CALCIUM  --  8.6* 8.7*  --   MG   < >  --  2.4 2.2  PHOS  --   --   --  3.1   < > = values in this interval not displayed.   Lipid Panel:     Component Value Date/Time   CHOL 148 05/04/2019 0621   TRIG 112 05/04/2019 0621   HDL 38 (L) 05/04/2019 0621   CHOLHDL 3.9 05/04/2019 0621   VLDL 22 05/04/2019 0621   LDLCALC 88 05/04/2019 0621   HgbA1c:  Lab Results  Component Value Date   HGBA1C 5.6 05/04/2019   Urine Drug Screen: No results found for: LABOPIA, COCAINSCRNUR, LABBENZ, AMPHETMU, THCU, LABBARB  Alcohol Level     Component Value Date/Time   ETH <10 05/01/2019 0700    IMAGING past 24 hours DG Chest Port 1 View  Result Date: 05/04/2019 CLINICAL DATA:   Status post cardiac surgery with chest soreness. EXAM: PORTABLE CHEST 1 VIEW COMPARISON:  05/02/2019 FINDINGS: Right IJ Cordis sheath is unchanged. Swan-Ganz catheter from an inferior approach terminates in the descending right pulmonary artery branch, similar. Mediastinal drain unchanged. Prior median sternotomy. Midline trachea. Cardiomegaly accentuated by AP portable technique. No pleural effusion or pneumothorax. Similar bibasilar atelectasis. No congestive failure. IMPRESSION: No significant change since one day prior. Cardiomegaly with low lung volumes and bibasilar atelectasis. No pneumothorax or other acute complication. Electronically Signed   By: Jeronimo Greaves M.D.   On: 05/04/2019 08:54   DG Abd Portable 1V  Result Date: 05/04/2019 CLINICAL DATA:  NG tube placement.  Postop 3 days. EXAM: PORTABLE ABDOMEN - 1 VIEW COMPARISON:  05/02/2019. FINDINGS: Interim placement of feeding tube, its tip is over the stomach. Swan-Ganz catheter noted with tip in the descending right pulmonary artery is in unchanged position. Mediastinal drainage catheter in unchanged position. No bowel distention. Contrast in the colon. IMPRESSION: Interim placement of feeding tube, its tip is over the stomach. Electronically Signed   By: Maisie Fus  Register   On: 05/04/2019 13:03   ECHOCARDIOGRAM LIMITED  Result Date: 05/03/2019    ECHOCARDIOGRAM LIMITED REPORT   Patient Name:   Erik Parker Date of Exam: 05/03/2019 Medical Rec #:  [id number]    Height:       72.0 in Accession #:    [id number]   Weight:       250.9 lb Date of Birth:  [birth year]   BSA:          2.346 m Patient Age:    53 years     BP:           134/75 mmHg Patient Gender: M            HR:           83 bpm. Exam Location:  Inpatient Procedure: Limited Echo                      STAT ECHO Reported to: Dr Nicholes Mango on 05/03/2019 6:05:00 PM       Dr. Gala Romney reviewed echo at bedside. Indications:    Ventricular Tachycardia I47.2  History:        Patient has prior history  of Echocardiogram examinations, most                 recent 05/01/2019. Risk Factors:Current Smoker. Aortic dissection.                 S/p ascending aortic replacement.  Sonographer:    Ross Ludwig RDCS (AE) Referring Phys: [id number] DANIEL R BENSIMHON  Sonographer Comments: No subcostal window. Drainage bandages in place subcostally. Large bandage over sternum. IMPRESSIONS  1. Prominent paradoxical septal motion and respiratory displacement of the intraventricular septum is seen. Left ventricular ejection fraction, by estimation, is 45 to 50%. The left ventricle has mildly decreased function. The left ventricle demonstrates global hypokinesis. There is mild concentric left ventricular hypertrophy. Left ventricular diastolic parameters were normal.  2. Right ventricular systolic function not well seen, probably mildly reduced. The right ventricular size is normal. Tricuspid regurgitation signal is inadequate for assessing PA pressure.  3. The mitral valve is normal in structure and function. No evidence of mitral valve regurgitation.  4. Native aortic valve preserved/repaired. The aortic valve is tricuspid. Aortic valve regurgitation is not visualized. Aortic valve mean gradient measures 1.7 mmHg.  5. 28 mm tube graft seen in the ascending aorta, with intact anastomoses. Residual dissection is not identified (on limited aortic views). 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 FINDINGS  Left Ventricle: Prominent paradoxical septal motion and respiratory displacement of the intraventricular septum is seen. Left ventricular ejection fraction, by estimation, is 45 to 50%. The left ventricle has mildly decreased function. The left ventricle demonstrates global hypokinesis. The left ventricular internal cavity size was normal in size. There is mild concentric left ventricular hypertrophy. Abnormal (paradoxical) septal motion consistent with post-operative status. Indeterminate filling pressures. Right Ventricle:  The right ventricular size is normal. Right ventricular systolic function not well seen, probably mildly reduced. Tricuspid regurgitation signal  is inadequate for assessing PA pressure. Left Atrium: Left atrial size was normal in size. Right Atrium: Right atrial size was not well visualized. Pericardium: There is no evidence of pericardial effusion. Mitral Valve: The mitral valve is normal in structure and function. Tricuspid Valve: The tricuspid valve is not well visualized. Tricuspid valve regurgitation is not demonstrated. Aortic Valve: Native aortic valve preserved/repaired. The aortic valve is tricuspid. Aortic valve regurgitation is not visualized. Aortic valve mean gradient measures 1.7 mmHg. Aortic valve peak gradient measures 3.3 mmHg. Aortic valve area, by VTI measures 3.31 cm. Pulmonic Valve: The pulmonic valve was grossly normal. Pulmonic valve regurgitation is not visualized. Aorta: 28 mm tube graft seen in the ascending aorta, with intact anastomoses. Residual dissection is not identified (on limited aortic views). 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  LEFT VENTRICLE PLAX 2D LVIDd:         4.30 cm  Diastology LVIDs:         3.70 cm  LV e' lateral:   7.51 cm/s LV PW:         1.37 cm  LV E/e' lateral: 10.1 LV IVS:        1.34 cm  LV e' medial:    8.49 cm/s LVOT diam:     2.00 cm  LV E/e' medial:  8.9 LV SV:         47 LV SV Index:   20 LVOT Area:     3.14 cm  LEFT ATRIUM         Index LA diam:    3.00 cm 1.28 cm/m  AORTIC VALVE AV Area (Vmax):    3.19 cm AV Area (Vmean):   3.01 cm AV Area (VTI):     3.31 cm AV Vmax:           90.37 cm/s AV Vmean:          60.400 cm/s AV VTI:            0.141 m AV Peak Grad:      3.3 mmHg AV Mean Grad:      1.7 mmHg LVOT Vmax:         91.90 cm/s LVOT Vmean:        57.900 cm/s LVOT VTI:          0.149 m LVOT/AV VTI ratio: 1.05  AORTA Ao Root diam: 3.90 cm MITRAL VALVE MV Area (PHT): 3.53 cm    SHUNTS MV Decel Time: 215 msec    Systemic VTI:   0.15 m MV E velocity: 75.80 cm/s  Systemic Diam: 2.00 cm MV A velocity: 47.10 cm/s MV E/A ratio:  1.61 Mihai Croitoru MD Electronically signed by Sanda Klein MD Signature Date/Time: 05/03/2019/7:29:59 PM    Final     PHYSICAL EXAM Pleasant middle-aged male presently not in distress. . Afebrile. Head is nontraumatic. Neck is supple without bruit.    Cardiac exam no murmur or gallop. Lungs are clear to auscultation. Distal pulses are well felt. Midline chest surgical scar from surgery. Neurological Exam : Awake alert.  Severe dysarthria with hypophonia and severe expressive aphasia as well.  Nods appropriately.  Unable to produce clear sounds.  Follows all commands.  Extraocular movements full range without nystagmus.  Blinks to threat bilaterally.  Face is symmetric.  Tongue midline motor system exam able to move all 4 extremities equally well against gravity without focal weakness.  Diminished fine finger movements on the right compared to the left.  Suspect mild right  grip weakness though effort is variable sensation appears intact bilaterally.  Gait not tested ASSESSMENT/PLAN Erik Parker is a 54 y.o. male with history of HTN and HLD presenting 05/01/2019 with R facial droop, inability to speak and R arm weakness. CTA showed a L frontal operculum infarct as well as an aortic dissection involving B ICAs. Taken to OR for repair. Neuro consulted post op.   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   CTA head & neck Stanford type aortic dissection involving B ICAs extending into R ICA bulb w/ string sign  CT perfusion may be unreliable in setting of dissection but does not suggest penumbra outside of L operculum core infarct   Intraop TEE 50%   Carotid Doppler see CTA neck   LDL 88 mg percent  HgbA1c 5.6  SCDs for VTE prophylaxis  aspirin 81 mg daily prior to admission, now on aspirin 325 mg daily.    Therapy recommendations:  Recommend PT and OT evals when stable to be up  Disposition:  pending   Hypertension  Stable . From stroke perspective, Permissive hypertension (OK if < 220/120) but gradually normalize in 5-7 days . Long-term BP goal normotensive  Hyperlipidemia  Home meds:  mevacor 40, resumed in hospital as pravachol 40 (formulary change)  LDL pending, goal < 70  Continue statin at discharge  Dysphagia . Secondary to stroke . MBS cleared for D1 nectar thick liquids . Speech on board   Other Stroke Risk Factors  Cigarette smoker, advised to stop smoking  ETOH use, alcohol level <10, advised to drink no more than 2 drink(s) a day  Obesity, Body mass index is 33.64 kg/m., recommend weight loss, diet and exercise as appropriate   Other Active Problems    Hospital day # 3  Patient underwent emergent aortic dissection surgery and MRI scan shows bilateral embolic infarcts.  His neurological exam likely shows severe expressive aphasia and mild right-sided weakness though his speech may also have a contribution from his intubation and vocal cord swelling.  Recommend aspirin for stroke prevention    Aggressive risk factor modification.  Continue physical occupational therapy and expect transfer to rehab in a few days when stable.  Stroke team will sign off.  Follow-up as an outpatient stroke clinic in 6 weeks.  Greater than 50% time during this 25-minute visit was spent on counseling and coordination of care and answering questions. Delia Heady, MD  To contact Stroke Continuity provider, please refer to [email or web address]. After hours, contact General Neurology

## 2019-05-04 NOTE — Evaluation (Signed)
Physical Therapy Evaluation Patient Details Name: Erik Parker MRN: [id number] DOB: [birth year] Today's Date: 05/04/2019   History of Present Illness  54 y/o h/o HTN and HL. Presented to [facility] on 3/2 with acute CVA. CTA head showed a left frontal operculum acute nonhemorrhagic infarct. Patient was noted to also have a dissection within the asending aorta on the lower cuts of the head/neck CT. Now s/p AAA.  Clinical Impression  Pt presents to PT with deficits in functional mobility, gait, balance, endurance, strength, power, communication, motor control and coordination. Pt with significant expressive aphasia, but appears to comprehend verbal and non-verbal communication well. Pt currently requires physical assistance for all functional mobility to reduce falls risk, as well as verbal cues to maintain sternal precautions. PT recommending high intensity inpatient PT services at the time of discharge to aide in a return to independent mobility.    Follow Up Recommendations CIR;Supervision/Assistance - 24 hour    Equipment Recommendations  Rolling walker with 5" wheels;3in1 (PT)    Recommendations for Other Services Rehab consult     Precautions / Restrictions Precautions Precautions: Sternal;Fall Precaution Comments: reviewed verbally and enforced use of pillow; expressive aphasia Restrictions Weight Bearing Restrictions: No Other Position/Activity Restrictions: sternal precautions      Mobility  Bed Mobility Overal bed mobility: Needs Assistance Bed Mobility: Sit to Supine       Sit to supine: Mod assist   General bed mobility comments: up in chair; mod A to guide BLEs back to bed  Transfers Overall transfer level: Needs assistance Equipment used: Rolling walker (2 wheeled) Transfers: Sit to/from Stand Sit to Stand: Min assist         General transfer comment: min A to rise and steady with RW, cues for maintaining sternal precautions  Ambulation/Gait Ambulation/Gait  assistance: Min assist Gait Distance (Feet): 10 Feet(10' x 2 trials) Assistive device: Rolling walker (2 wheeled) Gait Pattern/deviations: Step-to pattern Gait velocity: reduced Gait velocity interpretation: <1.31 ft/sec, indicative of household ambulator General Gait Details: pt with difficulty initiating gait, PT providing tacilte cues at hips to initiate weight shift. Pt with shortened step to gait  Stairs            Wheelchair Mobility    Modified Rankin (Stroke Patients Only) Modified Rankin (Stroke Patients Only) Pre-Morbid Rankin Score: No symptoms Modified Rankin: Moderately severe disability     Balance Overall balance assessment: Needs assistance Sitting-balance support: No upper extremity supported;Feet unsupported Sitting balance-Leahy Scale: Good Sitting balance - Comments: minG at edge of bed   Standing balance support: Bilateral upper extremity supported;During functional activity Standing balance-Leahy Scale: Poor Standing balance comment: reliant on external support when standing at sink                             Pertinent Vitals/Pain Pain Assessment: Faces Faces Pain Scale: Hurts little more Pain Location: chest Pain Descriptors / Indicators: Aching;Moaning Pain Intervention(s): Limited activity within patient's tolerance;Monitored during session;Repositioned    Home Living Family/patient expects to be discharged to:: Private residence Living Arrangements: Spouse/significant other Available Help at Discharge: Family;Other (Comment)(wife works 4 hours a day) Type of Home: House Home Access: Stairs to enter Entrance Stairs-Rails: Can reach both Technical brewer of Steps: 5 Home Layout: One level Home Equipment: None      Prior Function Level of Independence: Independent         Comments: able to nod yes that he was working, but unable to  state. Was fully independent prior     Hand Dominance   Dominant Hand: Right     Extremity/Trunk Assessment   Upper Extremity Assessment Upper Extremity Assessment: Generalized weakness;LUE deficits/detail LUE Deficits / Details: pt reports this hand feels weaker with strength testing; noted overall weakness with decreased motor initiation    Lower Extremity Assessment Lower Extremity Assessment: Defer to PT evaluation    Cervical / Trunk Assessment Cervical / Trunk Assessment: Normal  Communication   Communication: Expressive difficulties  Cognition Arousal/Alertness: Awake/alert Behavior During Therapy: WFL for tasks assessed/performed Overall Cognitive Status: Difficult to assess                                 General Comments: pt with significant expressive difficulties, but able to apporpriately follow commands and answer yes/no questions. Pt becoming tearful throughout session due to difficulty with communication. Attempted writing, but missing some letters.      General Comments General comments (skin integrity, edema, etc.): VSS on RA, pt reports some dizziness during standign at sink. Pt frustrated intermittently with communication deficits    Exercises     Assessment/Plan    PT Assessment Patient needs continued PT services  PT Problem List Decreased strength;Decreased activity tolerance;Decreased balance;Decreased mobility;Decreased coordination;Decreased cognition;Decreased knowledge of use of DME;Decreased safety awareness;Decreased knowledge of precautions;Cardiopulmonary status limiting activity;Pain;Impaired sensation       PT Treatment Interventions DME instruction;Gait training;Stair training;Functional mobility training;Therapeutic activities;Therapeutic exercise;Balance training;Neuromuscular re-education;Cognitive remediation;Patient/family education    PT Goals (Current goals can be found in the Care Plan section)  Acute Rehab PT Goals Patient Stated Goal: To return to independent mobility PT Goal Formulation: With  patient Time For Goal Achievement: 05/18/19 Potential to Achieve Goals: Good    Frequency Min 4X/week   Barriers to discharge        Co-evaluation PT/OT/SLP Co-Evaluation/Treatment: Yes Reason for Co-Treatment: For patient/therapist safety;To address functional/ADL transfers   OT goals addressed during session: ADL's and self-care;Proper use of Adaptive equipment and DME;Strengthening/ROM       AM-PAC PT "6 Clicks" Mobility  Outcome Measure Help needed turning from your back to your side while in a flat bed without using bedrails?: A Lot Help needed moving from lying on your back to sitting on the side of a flat bed without using bedrails?: A Lot Help needed moving to and from a bed to a chair (including a wheelchair)?: A Little Help needed standing up from a chair using your arms (e.g., wheelchair or bedside chair)?: A Little Help needed to walk in hospital room?: A Little Help needed climbing 3-5 steps with a railing? : A Lot 6 Click Score: 15    End of Session   Activity Tolerance: Patient tolerated treatment well Patient left: in bed;with call bell/phone within reach;with bed alarm set Nurse Communication: Mobility status PT Visit Diagnosis: Other abnormalities of gait and mobility (R26.89);Unsteadiness on feet (R26.81);Muscle weakness (generalized) (M62.81);Apraxia (R48.2);Other symptoms and signs involving the nervous system (R29.898)    Time: 5885-0277 PT Time Calculation (min) (ACUTE ONLY): 35 min   Charges:   PT Evaluation $PT Eval High Complexity: 1 High          Arlyss Gandy, PT, DPT Acute Rehabilitation Pager: [phone]   Arlyss Gandy 05/04/2019, 11:34 AM

## 2019-05-05 ENCOUNTER — Inpatient Hospital Stay (HOSPITAL_COMMUNITY): Payer: 59

## 2019-05-05 DIAGNOSIS — I639 Cerebral infarction, unspecified: Secondary | ICD-10-CM

## 2019-05-05 LAB — BASIC METABOLIC PANEL
Anion gap: 9 (ref 5–15)
BUN: 20 mg/dL (ref 6–20)
CO2: 26 mmol/L (ref 22–32)
Calcium: 8.7 mg/dL — ABNORMAL LOW (ref 8.9–10.3)
Chloride: 108 mmol/L (ref 98–111)
Creatinine, Ser: 0.66 mg/dL (ref 0.61–1.24)
GFR calc Af Amer: >60 mL/min (ref >60)
GFR calc non Af Amer: >60 mL/min (ref >60)
Glucose, Bld: 118 mg/dL — ABNORMAL HIGH (ref 70–99)
Potassium: 3.8 mmol/L (ref 3.5–5.1)
Sodium: 143 mmol/L (ref 135–145)

## 2019-05-05 LAB — BPAM RBC
Blood Product Expiration Date: 202103272359
Blood Product Expiration Date: 202103272359
Blood Product Expiration Date: 202103272359
Blood Product Expiration Date: 202103272359
Blood Product Expiration Date: 202103272359
Blood Product Expiration Date: 202104032359
ISSUE DATE / TIME: 202103021035
ISSUE DATE / TIME: 202103021035
ISSUE DATE / TIME: 202103021035
ISSUE DATE / TIME: 202103021035
Unit Type and Rh: 6200
Unit Type and Rh: 6200
Unit Type and Rh: 6200
Unit Type and Rh: 6200
Unit Type and Rh: 8400
Unit Type and Rh: 8400

## 2019-05-05 LAB — CBC
HCT: 33.7 % — ABNORMAL LOW (ref 39.0–52.0)
Hemoglobin: 10.9 g/dL — ABNORMAL LOW (ref 13.0–17.0)
MCH: 28.8 pg (ref 26.0–34.0)
MCHC: 32.3 g/dL (ref 30.0–36.0)
MCV: 88.9 fL (ref 80.0–100.0)
Platelets: 272 10*3/uL (ref 150–400)
RBC: 3.79 MIL/uL — ABNORMAL LOW (ref 4.22–5.81)
RDW: 16.7 % — ABNORMAL HIGH (ref 11.5–15.5)
WBC: 10.8 10*3/uL — ABNORMAL HIGH (ref 4.0–10.5)
nRBC: 0 % (ref 0.0–0.2)

## 2019-05-05 LAB — PHOSPHORUS
Phosphorus: 3.4 mg/dL (ref 2.5–4.6)
Phosphorus: 3.9 mg/dL (ref 2.5–4.6)

## 2019-05-05 LAB — TYPE AND SCREEN
ABO/RH(D): AB POS
Antibody Screen: NEGATIVE
Unit division: 0
Unit division: 0
Unit division: 0
Unit division: 0
Unit division: 0
Unit division: 0

## 2019-05-05 LAB — GLUCOSE, CAPILLARY
Glucose-Capillary: 108 mg/dL — ABNORMAL HIGH (ref 70–99)
Glucose-Capillary: 127 mg/dL — ABNORMAL HIGH (ref 70–99)
Glucose-Capillary: 136 mg/dL — ABNORMAL HIGH (ref 70–99)
Glucose-Capillary: 96 mg/dL (ref 70–99)
Glucose-Capillary: 98 mg/dL (ref 70–99)

## 2019-05-05 LAB — MAGNESIUM
Magnesium: 2.3 mg/dL (ref 1.7–2.4)
Magnesium: 2.1 mg/dL (ref 1.7–2.4)

## 2019-05-05 MED ORDER — RESOURCE THICKENUP CLEAR PO POWD
ORAL | Status: DC | PRN
  Filled 2019-05-05: qty 125

## 2019-05-05 MED ORDER — METOPROLOL TARTRATE 25 MG PO TABS
25.0000 mg | ORAL_TABLET | Freq: Two times a day (BID) | ORAL | Status: DC
  Administered 2019-05-05 – 2019-05-09 (×9): 25 mg via ORAL
  Filled 2019-05-05 (×9): qty 1

## 2019-05-05 MED ORDER — STARCH (THICKENING) PO POWD: ORAL | Status: DC | PRN

## 2019-05-05 NOTE — Progress Notes (Signed)
RN aware of d/c central line order and will complete.

## 2019-05-05 NOTE — Progress Notes (Signed)
CT surgery p.m. Rounds  Patient examined and record reviewed.Hemodynamics stable,labs satisfactory.Patient had stable day.Continue current care. Kathlee Nations Trigt III 05/05/2019

## 2019-05-05 NOTE — Progress Notes (Signed)
Advanced Heart Failure Rounding Note   Subjective:     Sitting up in chair. No further VT on tele. Remains aphasic.Follows commands well. Mildly weak on Right.   Diuresing well.   Objective:   Weight Range:  Vital Signs:   Temp:  [96.9 F (36.1 C)-98.5 F (36.9 C)] 98.5 F (36.9 C) (03/06 0750) Pulse Rate:  [79-99] 83 (03/06 0900) Resp:  [14-30] 17 (03/06 0900) BP: (96-137)/(55-87) 104/65 (03/06 0900) SpO2:  [91 %-100 %] 92 % (03/06 0900) Arterial Line BP: (107-138)/(49-72) 123/49 (03/05 1800) Weight:  [108 kg] 108 kg (03/06 0500) Last BM Date: 05/04/19  Weight change: Filed Weights   05/03/19 0500 05/04/19 0500 05/05/19 0500  Weight: 113.8 kg 112.5 kg 108 kg    Intake/Output:   Intake/Output Summary (Last 24 hours) at 05/05/2019 1004 Last data filed at 05/05/2019 0600 Gross per 24 hour  Intake 379.58 ml  Output 2000 ml  Net -1620.42 ml     Physical Exam: General:  Sitting up in chair. No resp difficulty HEENT: normal mild R facial droop  Neck: supple. no JVD. RIJ cath Carotids 2+ bilat; no bruits. No lymphadenopathy or thryomegaly appreciated. Cor: PMI nondisplaced. Regular rate & rhythm. No rubs, gallops or murmurs. Lungs: clear Abdomen: soft, nontender, nondistended. No hepatosplenomegaly. No bruits or masses. Good bowel sounds. Extremities: no cyanosis, clubbing, rash, edema  Neuro: alert following commands aphasic weak on R   Telemetry: NSR 80-90s no VT Personally reviewed   Labs: Basic Metabolic Panel: Recent Labs  Lab 05/02/19 1624 05/02/19 1624 05/03/19 0435 05/03/19 0435 05/03/19 1800 05/04/19 0621 05/04/19 1234 05/04/19 1711 05/05/19 0343  NA 136  --  138  --  137 140  --   --  143  K 3.8  --  3.7  --  3.7 3.7  --   --  3.8  CL 106  --  106  --  105 107  --   --  108  CO2 23  --  25  --  22 23  --   --  26  GLUCOSE 145*  --  135*  --  132* 109*  --   --  118*  BUN 10  --  7  --  9 15  --   --  20  CREATININE 0.71  --  0.59*  --   0.73 0.69  --   --  0.66  CALCIUM 8.4*   < > 8.6*   < > 8.6* 8.7*  --   --  8.7*  MG 2.1  --   --   --   --  2.4 2.2 2.4 2.3  PHOS  --   --   --   --   --   --  3.1 4.1 3.4   < > = values in this interval not displayed.    Liver Function Tests: Recent Labs  Lab 04/30/19 1257 05/01/19 0700  AST 20 15  ALT 33 27  ALKPHOS 52 47  BILITOT 0.6 0.9  PROT 7.2 6.9  ALBUMIN 4.3 3.9   Recent Labs  Lab 04/30/19 1432  LIPASE 47   No results for input(s): AMMONIA in the last 168 hours.  CBC: Recent Labs  Lab 04/30/19 1257 04/30/19 1257 05/01/19 0700 05/01/19 0716 05/02/19 0405 05/02/19 0409 05/02/19 0706 05/02/19 1624 05/03/19 0435 05/04/19 0621 05/05/19 0343  WBC 15.6*   < > 16.2*   < > 16.3*  --   --  18.9* 16.3*  12.5* 10.8*  NEUTROABS 11.1*  --  10.4*  --   --   --   --   --   --   --   --   HGB 14.9   < > 13.9   < > 10.9*   < > 11.6* 11.1* 11.1* 10.9* 10.9*  HCT 45.4   < > 42.7   < > 33.8*   < > 34.0* 34.4* 33.6* 33.1* 33.7*  MCV 87.5   < > 87.5   < > 88.5  --   --  87.5 87.5 87.1 88.9  PLT 307   < > 281   < > 195  --   --  221 197 224 272   < > = values in this interval not displayed.    Cardiac Enzymes: No results for input(s): CKTOTAL, CKMB, CKMBINDEX, TROPONINI in the last 168 hours.  BNP: BNP (last 3 results) Recent Labs    04/30/19 1257  BNP 31.0    ProBNP (last 3 results) No results for input(s): PROBNP in the last 8760 hours.    Other results:  Imaging: DG Chest Port 1 View  Result Date: 05/05/2019 CLINICAL DATA:  54 year old male with history of chest pain. EXAM: PORTABLE CHEST 1 VIEW COMPARISON:  Chest x-ray 05/04/2019. FINDINGS: There is a right-sided internal jugular central venous catheter with tip terminating in the proximal superior vena cava. A feeding tube is seen extending into the abdomen, however, the tip of the feeding tube extends below the lower margin of the image. Previously noted left-sided chest tube and mediastinal/pericardial  drain have been removed. Lung volumes are low. Linear scarring or subsegmental atelectasis in the left mid to lower lung. No acute consolidative airspace disease. No pleural effusions. No pneumothorax. No evidence of pulmonary edema. Heart size is mildly enlarged. Upper mediastinal contours are within normal limits. Surgical clips project over the level of the thoracic inlet and the right axillary region. Status post median sternotomy. IMPRESSION: 1. Postoperative changes and support apparatus, as above. 2. Low lung volumes with probable subsegmental atelectasis in the left lower lobe. 3. Mild cardiomegaly. Electronically Signed   By: Trudie Reedaniel  Entrikin M.D.   On: 05/05/2019 08:58   DG Chest Port 1 View  Result Date: 05/04/2019 CLINICAL DATA:  Status post cardiac surgery with chest soreness. EXAM: PORTABLE CHEST 1 VIEW COMPARISON:  05/02/2019 FINDINGS: Right IJ Cordis sheath is unchanged. Swan-Ganz catheter from an inferior approach terminates in the descending right pulmonary artery branch, similar. Mediastinal drain unchanged. Prior median sternotomy. Midline trachea. Cardiomegaly accentuated by AP portable technique. No pleural effusion or pneumothorax. Similar bibasilar atelectasis. No congestive failure. IMPRESSION: No significant change since one day prior. Cardiomegaly with low lung volumes and bibasilar atelectasis. No pneumothorax or other acute complication. Electronically Signed   By: Jeronimo GreavesKyle  Talbot M.D.   On: 05/04/2019 08:54   DG Abd Portable 1V  Result Date: 05/04/2019 CLINICAL DATA:  NG tube placement.  Postop 3 days. EXAM: PORTABLE ABDOMEN - 1 VIEW COMPARISON:  05/02/2019. FINDINGS: Interim placement of feeding tube, its tip is over the stomach. Swan-Ganz catheter noted with tip in the descending right pulmonary artery is in unchanged position. Mediastinal drainage catheter in unchanged position. No bowel distention. Contrast in the colon. IMPRESSION: Interim placement of feeding tube, its tip is  over the stomach. Electronically Signed   By: Maisie Fushomas  Register   On: 05/04/2019 13:03   DG Swallowing Func-Speech Pathology  Result Date: 05/03/2019 Objective Swallowing Evaluation: Type of Study:  MBS-Modified Barium Swallow Study  Patient Details Name: WM SAHAGUN MRN: [id number] Date of Birth: 18-Aug-1965 Today's Date: 05/03/2019 Time: SLP Start Time (ACUTE ONLY): 1040 -SLP Stop Time (ACUTE ONLY): 1100 SLP Time Calculation (min) (ACUTE ONLY): 20 min Past Medical History: Past Medical History: Diagnosis Date . Hyperlipidemia  . Hypertension  Past Surgical History: Past Surgical History: Procedure Laterality Date . ACHILLES TENDON SURGERY Right 11/10/2016  Procedure: ACHILLES TENDON REPAIR VIA SPEED BRIDGE;  Surgeon: [name], DPM;  Location: [facility];  Service: Podiatry;  Laterality: Right; . HEEL SPUR RESECTION Right 11/10/2016  Procedure: HEEL SPUR RESECTION (HAGLUNDS DEFORMITY);  Surgeon: [name], DPM;  Location: [facility];  Service: Podiatry;  Laterality: Right; . REPAIR OF ACUTE ASCENDING THORACIC AORTIC DISSECTION N/A 05/01/2019  Procedure: REPAIR OF ACUTE ASCENDING THORACIC AORTIC DISSECTION USING HEMOSHIELD GRAFT SIZE ;  Surgeon: Linden Dolin, MD;  Location: [facility] OR;  Service: Cardiothoracic;  Laterality: N/A; HPI: EIVIN MASCIO is a 54 y.o. male with history of hypertension, hyperlipidemia.  Patient apparently was seen in the emergency department on 05/01/2019 for right facial droop and inability to speak along with right arm weakness. Currently presnts with expressive aphasia. No h/o swallowing difficulties.  Subjective: alert, cooperative Assessment / Plan / Recommendation CHL IP CLINICAL IMPRESSIONS 05/03/2019 Clinical Impression Pt was seen in radiology suite for modified barium swallow study. Trials of puree solids, dysphagia 2 solids, regular texture solids, thin liquids, and nectar thick liquids via cup and straw were administered. Pt presents with moderate oropharyngeal dysphagia  characterized by impaired bolus control, prolonged mastication, a pharyngeal delay, and reduced anterior laryngeal movement. Bolus manipulation was reduced during mastication and mastication was inadequately thorough. He demonstrated premature spillage to the level of valleculae and pyriform sinuses. The swallow was often triggered with the head of the bolus at the level of the pyriform sinuses with thin liquids and at the level of the valleculae with other consistencies. Aspiration (PAS 7) was noted with thin liquids after the swallow and was likely due to residue in the pyriform sinuses. However, this cannot be conclusively stated since the aspiration event occurred between swallows when the fluoro was off. Coughing was noted throughout the study even in the absence of laryngeal invasion but coughing was ineffective in expelling the aspirant. A puree diet with nectar thick liquids may be started at this time with strict observance of swallowing precautions. However, considering the level of fatigue that he demonstrated and endorsed having at the end of the study, his endurance during meals is questioned and supplemental non-oral nutrition may therefore be needed. SLP will follow to assess diet tolerance and for treatment.  SLP Visit Diagnosis Dysphagia, unspecified (R13.10) Attention and concentration deficit following -- Frontal lobe and executive function deficit following -- Impact on safety and function Mild aspiration risk;Moderate aspiration risk   CHL IP TREATMENT RECOMMENDATION 05/03/2019 Treatment Recommendations Therapy as outlined in treatment plan below   Prognosis 05/03/2019 Prognosis for Safe Diet Advancement Good Barriers to Reach Goals Language deficits;Severity of deficits Barriers/Prognosis Comment -- CHL IP DIET RECOMMENDATION 05/03/2019 SLP Diet Recommendations Dysphagia 1 (Puree) solids;Nectar thick liquid Liquid Administration via Cup;Straw Medication Administration Crushed with puree Compensations  Slow rate;Small sips/bites Postural Changes Remain semi-upright after after feeds/meals (Comment);Seated upright at 90 degrees   CHL IP OTHER RECOMMENDATIONS 05/03/2019 Recommended Consults -- Oral Care Recommendations Oral care BID;Oral care before and after PO Other Recommendations Have oral suction available   CHL IP FOLLOW UP RECOMMENDATIONS 05/03/2019 Follow up Recommendations (  No Data)   CHL IP FREQUENCY AND DURATION 05/03/2019 Speech Therapy Frequency (ACUTE ONLY) min 2x/week Treatment Duration 2 weeks      CHL IP ORAL PHASE 05/03/2019 Oral Phase Impaired Oral - Pudding Teaspoon -- Oral - Pudding Cup -- Oral - Honey Teaspoon -- Oral - Honey Cup -- Oral - Nectar Teaspoon -- Oral - Nectar Cup Premature spillage;Decreased bolus cohesion;Delayed oral transit Oral - Nectar Straw Premature spillage;Decreased bolus cohesion;Delayed oral transit Oral - Thin Teaspoon -- Oral - Thin Cup Premature spillage;Decreased bolus cohesion;Delayed oral transit Oral - Thin Straw -- Oral - Puree Decreased bolus cohesion;Delayed oral transit;Piecemeal swallowing Oral - Mech Soft Impaired mastication;Delayed oral transit;Weak lingual manipulation Oral - Regular Impaired mastication;Decreased bolus cohesion Oral - Multi-Consistency -- Oral - Pill -- Oral Phase - Comment --  CHL IP PHARYNGEAL PHASE 05/03/2019 Pharyngeal Phase Impaired Pharyngeal- Pudding Teaspoon -- Pharyngeal -- Pharyngeal- Pudding Cup -- Pharyngeal -- Pharyngeal- Honey Teaspoon -- Pharyngeal -- Pharyngeal- Honey Cup -- Pharyngeal -- Pharyngeal- Nectar Teaspoon -- Pharyngeal -- Pharyngeal- Nectar Cup -- Pharyngeal -- Pharyngeal- Nectar Straw Delayed swallow initiation-vallecula;Pharyngeal residue - pyriform Pharyngeal -- Pharyngeal- Thin Teaspoon -- Pharyngeal -- Pharyngeal- Thin Cup Delayed swallow initiation-pyriform sinuses;Reduced anterior laryngeal mobility Pharyngeal -- Pharyngeal- Thin Straw Delayed swallow initiation-pyriform sinuses;Penetration/Apiration after  swallow;Reduced anterior laryngeal mobility Pharyngeal Material enters airway, passes BELOW cords and not ejected out despite cough attempt by patient Pharyngeal- Puree Reduced anterior laryngeal mobility Pharyngeal -- Pharyngeal- Mechanical Soft Reduced anterior laryngeal mobility Pharyngeal -- Pharyngeal- Regular -- Pharyngeal -- Pharyngeal- Multi-consistency -- Pharyngeal -- Pharyngeal- Pill -- Pharyngeal -- Pharyngeal Comment --  CHL IP CERVICAL ESOPHAGEAL PHASE 05/03/2019 Cervical Esophageal Phase WFL Pudding Teaspoon -- Pudding Cup -- Honey Teaspoon -- Honey Cup -- Nectar Teaspoon -- Nectar Cup -- Nectar Straw -- Thin Teaspoon -- Thin Cup -- Thin Straw -- Puree -- Mechanical Soft -- Regular -- Multi-consistency -- Pill -- Cervical Esophageal Comment -- Shanika I. Vear Clock, MS, CCC-SLP Acute Rehabilitation Services Office number [phone] Pager [phone] Scheryl Marten 05/03/2019, 12:23 PM              ECHOCARDIOGRAM LIMITED  Result Date: 05/03/2019    ECHOCARDIOGRAM LIMITED REPORT   Patient Name:   SANDERS MANNINEN Date of Exam: 05/03/2019 Medical Rec #:  [id number]    Height:       72.0 in Accession #:    [id number]   Weight:       250.9 lb Date of Birth:  [birth year]   BSA:          2.346 m Patient Age:    53 years     BP:           134/75 mmHg Patient Gender: M            HR:           83 bpm. Exam Location:  Inpatient Procedure: Limited Echo                      STAT ECHO Reported to: Dr Nicholes Mango on 05/03/2019 6:05:00 PM       Dr. Gala Romney reviewed echo at bedside. Indications:    Ventricular Tachycardia I47.2  History:        Patient has prior history of Echocardiogram examinations, most                 recent 05/01/2019. Risk Factors:Current Smoker. Aortic dissection.  S/p ascending aortic replacement.  Sonographer:    Clayton Lefort RDCS (AE) Referring Phys: [id number] Dennys Traughber R Adolph Clutter  Sonographer Comments: No subcostal window. Drainage bandages in place subcostally. Large bandage over  sternum. IMPRESSIONS  1. Prominent paradoxical septal motion and respiratory displacement of the intraventricular septum is seen. Left ventricular ejection fraction, by estimation, is 45 to 50%. The left ventricle has mildly decreased function. The left ventricle demonstrates global hypokinesis. There is mild concentric left ventricular hypertrophy. Left ventricular diastolic parameters were normal.  2. Right ventricular systolic function not well seen, probably mildly reduced. The right ventricular size is normal. Tricuspid regurgitation signal is inadequate for assessing PA pressure.  3. The mitral valve is normal in structure and function. No evidence of mitral valve regurgitation.  4. Native aortic valve preserved/repaired. The aortic valve is tricuspid. Aortic valve regurgitation is not visualized. Aortic valve mean gradient measures 1.7 mmHg.  5. 28 mm tube graft seen in the ascending aorta, with intact anastomoses. Residual dissection is not identified (on limited aortic views). 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 FINDINGS  Left Ventricle: Prominent paradoxical septal motion and respiratory displacement of the intraventricular septum is seen. Left ventricular ejection fraction, by estimation, is 45 to 50%. The left ventricle has mildly decreased function. The left ventricle demonstrates global hypokinesis. The left ventricular internal cavity size was normal in size. There is mild concentric left ventricular hypertrophy. Abnormal (paradoxical) septal motion consistent with post-operative status. Indeterminate filling pressures. Right Ventricle: The right ventricular size is normal. Right ventricular systolic function not well seen, probably mildly reduced. Tricuspid regurgitation signal is inadequate for assessing PA pressure. Left Atrium: Left atrial size was normal in size. Right Atrium: Right atrial size was not well visualized. Pericardium: There is no evidence of pericardial effusion.  Mitral Valve: The mitral valve is normal in structure and function. Tricuspid Valve: The tricuspid valve is not well visualized. Tricuspid valve regurgitation is not demonstrated. Aortic Valve: Native aortic valve preserved/repaired. The aortic valve is tricuspid. Aortic valve regurgitation is not visualized. Aortic valve mean gradient measures 1.7 mmHg. Aortic valve peak gradient measures 3.3 mmHg. Aortic valve area, by VTI measures 3.31 cm. Pulmonic Valve: The pulmonic valve was grossly normal. Pulmonic valve regurgitation is not visualized. Aorta: 28 mm tube graft seen in the ascending aorta, with intact anastomoses. Residual dissection is not identified (on limited aortic views). 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  LEFT VENTRICLE PLAX 2D LVIDd:         4.30 cm  Diastology LVIDs:         3.70 cm  LV e' lateral:   7.51 cm/s LV PW:         1.37 cm  LV E/e' lateral: 10.1 LV IVS:        1.34 cm  LV e' medial:    8.49 cm/s LVOT diam:     2.00 cm  LV E/e' medial:  8.9 LV SV:         47 LV SV Index:   20 LVOT Area:     3.14 cm  LEFT ATRIUM         Index LA diam:    3.00 cm 1.28 cm/m  AORTIC VALVE AV Area (Vmax):    3.19 cm AV Area (Vmean):   3.01 cm AV Area (VTI):     3.31 cm AV Vmax:           90.37 cm/s AV Vmean:  60.400 cm/s AV VTI:            0.141 m AV Peak Grad:      3.3 mmHg AV Mean Grad:      1.7 mmHg LVOT Vmax:         91.90 cm/s LVOT Vmean:        57.900 cm/s LVOT VTI:          0.149 m LVOT/AV VTI ratio: 1.05  AORTA Ao Root diam: 3.90 cm MITRAL VALVE MV Area (PHT): 3.53 cm    SHUNTS MV Decel Time: 215 msec    Systemic VTI:  0.15 m MV E velocity: 75.80 cm/s  Systemic Diam: 2.00 cm MV A velocity: 47.10 cm/s MV E/A ratio:  1.61 Mihai Croitoru MD Electronically signed by Thurmon Fair MD Signature Date/Time: 05/03/2019/7:29:59 PM    Final      Medications:     Scheduled Medications: . acetaminophen  1,000 mg Oral Q6H   Or  . acetaminophen (TYLENOL) oral liquid 160  mg/5 mL  1,000 mg Per Tube Q6H  . acetaminophen (TYLENOL) oral liquid 160 mg/5 mL  650 mg Per Tube Once   Or  . acetaminophen  650 mg Rectal Once  . aspirin EC  325 mg Oral Daily   Or  . aspirin  324 mg Per Tube Daily  . bisacodyl  10 mg Oral Daily   Or  . bisacodyl  10 mg Rectal Daily  . chlorhexidine  15 mL Mouth Rinse BID  . Chlorhexidine Gluconate Cloth  6 each Topical Daily  . colchicine  0.6 mg Oral BID  . docusate sodium  200 mg Oral Daily  . feeding supplement (OSMOLITE 1.5 CAL)  1,000 mL Per Tube Q24H  . feeding supplement (PRO-STAT SUGAR FREE 64)  30 mL Per Tube BID  . folic acid  1 mg Oral Daily  . furosemide  60 mg Intravenous Daily  . insulin aspart  0-24 Units Subcutaneous Q4H  . mouth rinse  15 mL Mouth Rinse q12n4p  . metoprolol tartrate  5 mg Intravenous Q6H  . pantoprazole (PROTONIX) IV  40 mg Intravenous Q24H  . rosuvastatin  20 mg Oral q1800  . sodium chloride flush  10-40 mL Intracatheter Q12H  . sodium chloride flush  3 mL Intravenous Q12H    Infusions: . sodium chloride    . sodium chloride Stopped (05/04/19 0539)  . lactated ringers 10 mL/hr at 05/05/19 0600  . niCARDipine    . nitroGLYCERIN Stopped (05/03/19 1915)  . phenylephrine (NEO-SYNEPHRINE) Adult infusion Stopped (05/02/19 0435)    PRN Medications: sodium chloride, influenza vac split quadrivalent PF, morphine injection, ondansetron (ZOFRAN) IV, oxyCODONE, pneumococcal 23 valent vaccine, sodium chloride flush, sodium chloride flush, traMADol   Assessment/Plan:   1. Monomorphic VT on 3/4 (23 beats) - obviously the main question here is if this is ischemic-mediated or not as coronaries were not able to be evaluated in setting of acute dissection. On pre-op studies and in OR no evidence of RCA involvement - stat echo on 3/4 EF 45-50%  no regional wall motion abnormalities to suggest ACS - hstrop moderately elevated but flat in setting of heart surgery, Not c/w ACS - VT quiescent - will  continue to follow on tele Keep K> 4.0 mg > 2.0 - continue carvedilol - No evidence of ACS or ongoing ischemia. not good candidate for invasive eval with recent dissection -> [place] and acute CVA. Can consider cardiac CTA at some point  if HR  acceptable and he can cooperate with testing  2. 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 - stable by echo  - no change.  - diuresing well. Continue lasix  3. Acute post-op pericarditis - agree with colchicine  4. CVA, acute with severe aphasia - Neuro following - improving slowly - Neuro and CIR following  5. HTN - BP ok.   D/w Dr. Donata Clay. Can go to [facility]. Remove introducer and Foley.   Length of Stay: 4   Arvilla Meres MD 05/05/2019, 10:04 AM  Advanced Heart Failure Team Pager [phone] (M-F; 7a - 4p)  Please contact [facility] for night-coverage after hours (4p -7a ) and weekends on [email or web address]

## 2019-05-05 NOTE — Progress Notes (Signed)
4 Days Post-Op Procedure(s) (LRB): REPAIR OF ACUTE ASCENDING THORACIC AORTIC DISSECTION USING HEMOSHIELD GRAFT SIZE (N/A) Subjective: NSR BP controlled D-1 diet with supplemental TF Walking well transfer to 2 C CXR clear Objective: Vital signs in last 24 hours: Temp:  [96.9 F (36.1 C)-98.5 F (36.9 C)] 98.5 F (36.9 C) (03/06 0750) Pulse Rate:  [79-99] 83 (03/06 0900) Cardiac Rhythm: Normal sinus rhythm (03/06 0400) Resp:  [14-30] 17 (03/06 0900) BP: (96-137)/(55-87) 104/65 (03/06 0900) SpO2:  [91 %-100 %] 92 % (03/06 0900) Arterial Line BP: (107-138)/(49-72) 123/49 (03/05 1800) Weight:  [818 kg] 108 kg (03/06 0500)  Hemodynamic parameters for last 24 hours:    Intake/Output from previous day: 03/05 0701 - 03/06 0700 In: 619.6 [P.O.:200; I.V.:239.6; NG/GT:180] Out: 2055 [Urine:2055] Intake/Output this shift: No intake/output data recorded.       Exam    General- alert and comfortable    Neck- no JVD, no cervical adenopathy palpable, no carotid bruit   Lungs- clear without rales, wheezes   Cor- regular rate and rhythm, no murmur , gallop   Abdomen- soft, non-tender   Extremities - warm, non-tender, minimal edema   Neuro- oriented, appropriate, no focal weakness    Lab Results: Recent Labs    05/04/19 0621 05/05/19 0343  WBC 12.5* 10.8*  HGB 10.9* 10.9*  HCT 33.1* 33.7*  PLT 224 272   BMET:  Recent Labs    05/04/19 0621 05/05/19 0343  NA 140 143  K 3.7 3.8  CL 107 108  CO2 23 26  GLUCOSE 109* 118*  BUN 15 20  CREATININE 0.69 0.66  CALCIUM 8.7* 8.7*    PT/INR: No results for input(s): LABPROT, INR in the last 72 hours. ABG    Component Value Date/Time   PHART 7.441 05/02/2019 0706   HCO3 23.1 05/02/2019 0706   TCO2 24 05/02/2019 0706   ACIDBASEDEF 1.0 05/02/2019 0409   O2SAT 96.0 05/02/2019 0706   CBG (last 3)  Recent Labs    05/05/19 0031 05/05/19 0431 05/05/19 0748  GLUCAP 96 108* 136*    Assessment/Plan: S/P Procedure(s)  (LRB): REPAIR OF ACUTE ASCENDING THORACIC AORTIC DISSECTION USING HEMOSHIELD GRAFT SIZE (N/A) Mobilize d/c tubes/lines Plan for transfer to step-down: see transfer orders   LOS: 4 days    Erik Parker 05/05/2019

## 2019-05-05 NOTE — Plan of Care (Signed)
  Problem: Education: Goal: Knowledge of General Education information will improve Description: Including pain rating scale, medication(s)/side effects and non-pharmacologic comfort measures Outcome: Progressing   Problem: Health Behavior/Discharge Planning: Goal: Ability to manage health-related needs will improve Outcome: Progressing   Problem: Clinical Measurements: Goal: Ability to maintain clinical measurements within normal limits will improve Outcome: Progressing Goal: Will remain free from infection Outcome: Progressing Goal: Diagnostic test results will improve Outcome: Progressing Goal: Respiratory complications will improve Outcome: Progressing Goal: Cardiovascular complication will be avoided Outcome: Progressing   Problem: Activity: Goal: Risk for activity intolerance will decrease Outcome: Progressing   Problem: Nutrition: Goal: Adequate nutrition will be maintained Outcome: Progressing   Problem: Coping: Goal: Level of anxiety will decrease Outcome: Progressing   Problem: Elimination: Goal: Will not experience complications related to bowel motility Outcome: Progressing Goal: Will not experience complications related to urinary retention Outcome: Progressing   Problem: Pain Managment: Goal: General experience of comfort will improve Outcome: Progressing   Problem: Safety: Goal: Ability to remain free from injury will improve Outcome: Progressing   Problem: Skin Integrity: Goal: Risk for impaired skin integrity will decrease Outcome: Progressing   Problem: Education: Goal: Knowledge of disease or condition will improve Outcome: Progressing Goal: Knowledge of secondary prevention will improve Outcome: Progressing Goal: Knowledge of patient specific risk factors addressed and post discharge goals established will improve Outcome: Progressing   Problem: Coping: Goal: Will identify appropriate support needs Outcome: Progressing   Problem:  Health Behavior/Discharge Planning: Goal: Ability to manage health-related needs will improve Outcome: Progressing   Problem: Self-Care: Goal: Ability to participate in self-care as condition permits will improve Outcome: Progressing Goal: Verbalization of feelings and concerns over difficulty with self-care will improve Outcome: Progressing Goal: Ability to communicate needs accurately will improve Outcome: Progressing   Problem: Nutrition: Goal: Risk of aspiration will decrease Outcome: Progressing Goal: Dietary intake will improve Outcome: Progressing

## 2019-05-06 ENCOUNTER — Inpatient Hospital Stay (HOSPITAL_COMMUNITY): Payer: 59

## 2019-05-06 DIAGNOSIS — R1030 Lower abdominal pain, unspecified: Secondary | ICD-10-CM

## 2019-05-06 LAB — BASIC METABOLIC PANEL
Anion gap: 8 (ref 5–15)
BUN: 23 mg/dL — ABNORMAL HIGH (ref 6–20)
CO2: 26 mmol/L (ref 22–32)
Calcium: 8.7 mg/dL — ABNORMAL LOW (ref 8.9–10.3)
Chloride: 109 mmol/L (ref 98–111)
Creatinine, Ser: 0.64 mg/dL (ref 0.61–1.24)
GFR calc Af Amer: >60 mL/min (ref >60)
GFR calc non Af Amer: >60 mL/min (ref >60)
Glucose, Bld: 95 mg/dL (ref 70–99)
Potassium: 3.9 mmol/L (ref 3.5–5.1)
Sodium: 143 mmol/L (ref 135–145)

## 2019-05-06 LAB — CBC
HCT: 34.3 % — ABNORMAL LOW (ref 39.0–52.0)
Hemoglobin: 11.1 g/dL — ABNORMAL LOW (ref 13.0–17.0)
MCH: 28.5 pg (ref 26.0–34.0)
MCHC: 32.4 g/dL (ref 30.0–36.0)
MCV: 88.2 fL (ref 80.0–100.0)
Platelets: 331 10*3/uL (ref 150–400)
RBC: 3.89 MIL/uL — ABNORMAL LOW (ref 4.22–5.81)
RDW: 16.3 % — ABNORMAL HIGH (ref 11.5–15.5)
WBC: 10.5 10*3/uL (ref 4.0–10.5)
nRBC: 0 % (ref 0.0–0.2)

## 2019-05-06 MED ORDER — ORAL CARE MOUTH RINSE
15.0000 mL | Freq: Two times a day (BID) | OROMUCOSAL | Status: DC
  Administered 2019-05-06 – 2019-05-08 (×6): 15 mL via OROMUCOSAL

## 2019-05-06 MED ORDER — METOCLOPRAMIDE HCL 5 MG/ML IJ SOLN
10.0000 mg | Freq: Three times a day (TID) | INTRAMUSCULAR | Status: AC
  Administered 2019-05-06 – 2019-05-07 (×5): 10 mg via INTRAVENOUS
  Filled 2019-05-06 (×5): qty 2

## 2019-05-06 NOTE — Progress Notes (Signed)
Advanced Heart Failure Rounding Note   Subjective:     Sitting up in chair. No further VT on tele. Remains aphasic eith mild R-sided weakness but otherwise intact.   C/o ab pain and nausea this am. Unable to eat. No CP or SOB. Has had BM.   Objective:   Weight Range:  Vital Signs:   Temp:  [97.8 F (36.6 C)-98.6 F (37 C)] 98.2 F (36.8 C) (03/07 0300) Pulse Rate:  [77-91] 88 (03/07 0600) Resp:  [14-23] 19 (03/07 0600) BP: (104-133)/(65-91) 116/81 (03/07 0600) SpO2:  [88 %-99 %] 95 % (03/07 0600) Last BM Date: 05/04/19  Weight change: Filed Weights   05/03/19 0500 05/04/19 0500 05/05/19 0500  Weight: 113.8 kg 112.5 kg 108 kg    Intake/Output:   Intake/Output Summary (Last 24 hours) at 05/06/2019 9798 Last data filed at 05/06/2019 0600 Gross per 24 hour  Intake 1752.69 ml  Output 1125 ml  Net 627.69 ml     Physical Exam: General:  Sitting up in chair. No resp difficulty HEENT: normal mild R facial droop Neck: supple. no JVD. Carotids 2+ bilat; no bruits. No lymphadenopathy or thryomegaly appreciated. Cor: Sternal wound ok PMI nondisplaced. Regular rate & rhythm. No rubs, gallops or murmurs. Lungs: clear Abdomen: soft, mildly tender RLQ no rebound nondistended. No hepatosplenomegaly. No bruits or masses. Good bowel sounds. Extremities: no cyanosis, clubbing, rash, edema Neuro: alert follows commands. Mildly weak on R apahsic   Telemetry: NSR 80-90s no VT Personally reviewed   Labs: Basic Metabolic Panel: Recent Labs  Lab 05/03/19 0435 05/03/19 0435 05/03/19 1800 05/03/19 1800 05/04/19 0621 05/04/19 1234 05/04/19 1711 05/05/19 0343 05/05/19 2123 05/06/19 0322  NA 138  --  137  --  140  --   --  143  --  143  K 3.7  --  3.7  --  3.7  --   --  3.8  --  3.9  CL 106  --  105  --  107  --   --  108  --  109  CO2 25  --  22  --  23  --   --  26  --  26  GLUCOSE 135*  --  132*  --  109*  --   --  118*  --  95  BUN 7  --  9  --  15  --   --  20  --  23*   CREATININE 0.59*  --  0.73  --  0.69  --   --  0.66  --  0.64  CALCIUM 8.6*   < > 8.6*   < > 8.7*  --   --  8.7*  --  8.7*  MG  --   --   --   --  2.4 2.2 2.4 2.3 2.1  --   PHOS  --   --   --   --   --  3.1 4.1 3.4 3.9  --    < > = values in this interval not displayed.    Liver Function Tests: Recent Labs  Lab 04/30/19 1257 05/01/19 0700  AST 20 15  ALT 33 27  ALKPHOS 52 47  BILITOT 0.6 0.9  PROT 7.2 6.9  ALBUMIN 4.3 3.9   Recent Labs  Lab 04/30/19 1432  LIPASE 47   No results for input(s): AMMONIA in the last 168 hours.  CBC: Recent Labs  Lab 04/30/19 1257 04/30/19 1257 05/01/19 0700 05/01/19 9211  05/02/19 1624 05/03/19 0435 05/04/19 0621 05/05/19 0343 05/06/19 0322  WBC 15.6*   < > 16.2*   < > 18.9* 16.3* 12.5* 10.8* 10.5  NEUTROABS 11.1*  --  10.4*  --   --   --   --   --   --   HGB 14.9   < > 13.9   < > 11.1* 11.1* 10.9* 10.9* 11.1*  HCT 45.4   < > 42.7   < > 34.4* 33.6* 33.1* 33.7* 34.3*  MCV 87.5   < > 87.5   < > 87.5 87.5 87.1 88.9 88.2  PLT 307   < > 281   < > 221 197 224 272 331   < > = values in this interval not displayed.    Cardiac Enzymes: No results for input(s): CKTOTAL, CKMB, CKMBINDEX, TROPONINI in the last 168 hours.  BNP: BNP (last 3 results) Recent Labs    04/30/19 1257  BNP 31.0    ProBNP (last 3 results) No results for input(s): PROBNP in the last 8760 hours.    Other results:  Imaging: DG Chest 2 View  Result Date: 05/06/2019 CLINICAL DATA:  54 year old male with history of chest pain. EXAM: CHEST - 2 VIEW COMPARISON:  Chest x-ray 05/05/2019. FINDINGS: Previously noted right IJ Cordis has been removed. Feeding tube extending into the body of the stomach. Lung volumes are normal. No consolidative airspace disease. No pleural effusions. No pneumothorax. No pulmonary nodule or mass noted. Pulmonary vasculature is normal. Heart size is mildly enlarged. Upper mediastinal contours are within normal limits. Status post median  sternotomy. IMPRESSION: 1. Support apparatus, as above. 2. No radiographic evidence of acute cardiopulmonary disease. 3. Mild cardiomegaly. Electronically Signed   By: Vinnie Langton M.D.   On: 05/06/2019 06:11   DG Chest Port 1 View  Result Date: 05/05/2019 CLINICAL DATA:  54 year old male with history of chest pain. EXAM: PORTABLE CHEST 1 VIEW COMPARISON:  Chest x-ray 05/04/2019. FINDINGS: There is a right-sided internal jugular central venous catheter with tip terminating in the proximal superior vena cava. A feeding tube is seen extending into the abdomen, however, the tip of the feeding tube extends below the lower margin of the image. Previously noted left-sided chest tube and mediastinal/pericardial drain have been removed. Lung volumes are low. Linear scarring or subsegmental atelectasis in the left mid to lower lung. No acute consolidative airspace disease. No pleural effusions. No pneumothorax. No evidence of pulmonary edema. Heart size is mildly enlarged. Upper mediastinal contours are within normal limits. Surgical clips project over the level of the thoracic inlet and the right axillary region. Status post median sternotomy. IMPRESSION: 1. Postoperative changes and support apparatus, as above. 2. Low lung volumes with probable subsegmental atelectasis in the left lower lobe. 3. Mild cardiomegaly. Electronically Signed   By: Vinnie Langton M.D.   On: 05/05/2019 08:58   DG Abd Portable 1V  Result Date: 05/04/2019 CLINICAL DATA:  NG tube placement.  Postop 3 days. EXAM: PORTABLE ABDOMEN - 1 VIEW COMPARISON:  05/02/2019. FINDINGS: Interim placement of feeding tube, its tip is over the stomach. Swan-Ganz catheter noted with tip in the descending right pulmonary artery is in unchanged position. Mediastinal drainage catheter in unchanged position. No bowel distention. Contrast in the colon. IMPRESSION: Interim placement of feeding tube, its tip is over the stomach. Electronically Signed   By: Marcello Moores   Register   On: 05/04/2019 13:03     Medications:     Scheduled Medications: . acetaminophen  1,000 mg Oral Q6H   Or  . acetaminophen (TYLENOL) oral liquid 160 mg/5 mL  1,000 mg Per Tube Q6H  . acetaminophen (TYLENOL) oral liquid 160 mg/5 mL  650 mg Per Tube Once   Or  . acetaminophen  650 mg Rectal Once  . aspirin EC  325 mg Oral Daily   Or  . aspirin  324 mg Per Tube Daily  . bisacodyl  10 mg Oral Daily   Or  . bisacodyl  10 mg Rectal Daily  . chlorhexidine  15 mL Mouth Rinse BID  . Chlorhexidine Gluconate Cloth  6 each Topical Daily  . colchicine  0.6 mg Oral BID  . docusate sodium  200 mg Oral Daily  . feeding supplement (OSMOLITE 1.5 CAL)  1,000 mL Per Tube Q24H  . feeding supplement (PRO-STAT SUGAR FREE 64)  30 mL Per Tube BID  . folic acid  1 mg Oral Daily  . mouth rinse  15 mL Mouth Rinse q12n4p  . metoprolol tartrate  25 mg Oral BID  . pantoprazole (PROTONIX) IV  40 mg Intravenous Q24H  . rosuvastatin  20 mg Oral q1800  . sodium chloride flush  10-40 mL Intracatheter Q12H  . sodium chloride flush  3 mL Intravenous Q12H    Infusions: . sodium chloride    . sodium chloride Stopped (05/04/19 0539)  . lactated ringers Stopped (05/05/19 1048)    PRN Medications: sodium chloride, influenza vac split quadrivalent PF, ondansetron (ZOFRAN) IV, oxyCODONE, pneumococcal 23 valent vaccine, Resource ThickenUp Clear, sodium chloride flush, sodium chloride flush, traMADol   Assessment/Plan:   1. Monomorphic VT on 3/4 (23 beats) - obviously the main question here is if this is ischemic-mediated or not as coronaries were not able to be evaluated in setting of acute dissection. On pre-op studies and in OR no evidence of RCA involvement - stat echo on 3/4 EF 45-50%  no regional wall motion abnormalities to suggest ACS - hstrop moderately elevated but flat in setting of heart surgery, Not c/w ACS - VT quiescent.  - will continue to follow on tele Keep K> 4.0 mg > 2.0 -  continue carvedilol - No evidence of ACS or ongoing ischemia. not good candidate for invasive eval with recent dissection -> [place] and acute CVA. Can consider cardiac CTA at some point  if HR acceptable and he can cooperate with testing  2. 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 - stable by echo  - no change.  - has diuresed well. Weight below baseline. Stop lasix  3. Acute post-op pericarditis - agree with colchicine  4. CVA, acute with severe aphasia - Neuro following - improving slowly. Still weak on R - Neuro and CIR following  5. HTN - BP ok.   6. Ab pain - start reglan. Get KUB  Length of Stay: 5   Arvilla Meres MD 05/06/2019, 8:22 AM  Advanced Heart Failure Team Pager [phone] (M-F; 7a - 4p)  Please contact [facility] for night-coverage after hours (4p -7a ) and weekends on [email or web address]

## 2019-05-06 NOTE — Progress Notes (Signed)
      [address]       [place] [address]             [phone]                 5 Days Post-Op Procedure(s) (LRB): REPAIR OF ACUTE ASCENDING THORACIC AORTIC DISSECTION USING HEMOSHIELD GRAFT SIZE (N/A)   Events: No events complains of chest pain _______________________________________________________________ Vitals: BP 116/81   Pulse 88   Temp 98.6 F (37 C) (Oral)   Resp 19   Ht 6' (1.829 m)   Wt 108 kg   SpO2 95%   BMI 32.29 kg/m   - Neuro: arousable, NAD  - Cardiovascular: sinus   - Pulm: EWOB, clear    ABG    Component Value Date/Time   PHART 7.441 05/02/2019 0706   PCO2ART 34.1 05/02/2019 0706   PO2ART 79.0 (L) 05/02/2019 0706   HCO3 23.1 05/02/2019 0706   TCO2 24 05/02/2019 0706   ACIDBASEDEF 1.0 05/02/2019 0409   O2SAT 96.0 05/02/2019 0706    - Abd: sofr - Extremity: trace edema  .Intake/Output      03/06 0701 - 03/07 0700 03/07 0701 - 03/08 0700   P.O. 600    I.V. (mL/kg) 48 (0.4)    NG/GT 1104.7    Total Intake(mL/kg) 1752.7 (16.2)    Urine (mL/kg/hr) 1125 (0.4)    Stool 0    Total Output 1125    Net +627.7         Urine Occurrence 2 x    Stool Occurrence 3 x       _______________________________________________________________ Labs: CBC Latest Ref Rng & Units 05/06/2019 05/05/2019 05/04/2019  WBC 4.0 - 10.5 K/uL 10.5 10.8(H) 12.5(H)  Hemoglobin 13.0 - 17.0 g/dL 11.1(L) 10.9(L) 10.9(L)  Hematocrit 39.0 - 52.0 % 34.3(L) 33.7(L) 33.1(L)  Platelets 150 - 400 K/uL 331 272 224   CMP Latest Ref Rng & Units 05/06/2019 05/05/2019 05/04/2019  Glucose 70 - 99 mg/dL 95 235(T) 732(K)  BUN 6 - 20 mg/dL 02(R) 20 15  Creatinine 0.61 - 1.24 mg/dL 4.27 0.62 3.76  Sodium 135 - 145 mmol/L 143 143 140  Potassium 3.5 - 5.1 mmol/L 3.9 3.8 3.7  Chloride 98 - 111 mmol/L 109 108 107  CO2 22 - 32 mmol/L 26 26 23   Calcium 8.9 - 10.3 mg/dL ) 2.8(B) 1.5(V)  Total Protein 6.5 - 8.1 g/dL - - -  Total Bilirubin 0.3 - 1.2 mg/dL - - -    Alkaline Phos 38 - 126 U/L - - -  AST 15 - 41 U/L - - -  ALT 0 - 44 U/L - - -     _______________________________________________________________  Assessment and Plan: POD 5 s/p type A dissection repair  Neuro: neuro on board for CABG CV: stable, bp management Pulm: continue pulm toilet Renal: stable GI: aphasia, NG tube in place Heme: stable ID: [email or web address] Endo: SSI Dispo: 2C  7.6(H, MD 05/06/2019 11:11 AM

## 2019-05-06 NOTE — Progress Notes (Addendum)
Called Mr. Haas significant other Leatha Gilding to inform of patient transfer to [id number].  Mrs. Tenny Craw' voicemail box is full, unable to leave message to inform of transfer.   1220 - Alternate phone number for Mrs. [name] Ross written down on a piece of paper in patient's room. RN was able to contact Mrs. Ross and inform of transfer.

## 2019-05-06 NOTE — Plan of Care (Signed)
  Problem: Education: Goal: Knowledge of General Education information will improve Description: Including pain rating scale, medication(s)/side effects and non-pharmacologic comfort measures Outcome: Progressing   Problem: Health Behavior/Discharge Planning: Goal: Ability to manage health-related needs will improve Outcome: Progressing   Problem: Clinical Measurements: Goal: Ability to maintain clinical measurements within normal limits will improve Outcome: Progressing Goal: Will remain free from infection Outcome: Progressing Goal: Diagnostic test results will improve Outcome: Progressing Goal: Respiratory complications will improve Outcome: Progressing Goal: Cardiovascular complication will be avoided Outcome: Progressing   Problem: Activity: Goal: Risk for activity intolerance will decrease Outcome: Progressing   Problem: Nutrition: Goal: Adequate nutrition will be maintained Outcome: Progressing   Problem: Coping: Goal: Level of anxiety will decrease Outcome: Progressing   Problem: Elimination: Goal: Will not experience complications related to bowel motility Outcome: Progressing Goal: Will not experience complications related to urinary retention Outcome: Progressing   Problem: Pain Managment: Goal: General experience of comfort will improve Outcome: Progressing   Problem: Safety: Goal: Ability to remain free from injury will improve Outcome: Progressing   Problem: Skin Integrity: Goal: Risk for impaired skin integrity will decrease Outcome: Progressing   Problem: Education: Goal: Knowledge of disease or condition will improve Outcome: Progressing Goal: Knowledge of secondary prevention will improve Outcome: Progressing Goal: Knowledge of patient specific risk factors addressed and post discharge goals established will improve Outcome: Progressing   Problem: Coping: Goal: Will identify appropriate support needs Outcome: Progressing   Problem:  Health Behavior/Discharge Planning: Goal: Ability to manage health-related needs will improve Outcome: Progressing   Problem: Self-Care: Goal: Ability to participate in self-care as condition permits will improve Outcome: Progressing Goal: Verbalization of feelings and concerns over difficulty with self-care will improve Outcome: Progressing Goal: Ability to communicate needs accurately will improve Outcome: Progressing   Problem: Nutrition: Goal: Risk of aspiration will decrease Outcome: Progressing Goal: Dietary intake will improve Outcome: Progressing   Problem: Education: Goal: Will demonstrate proper wound care and an understanding of methods to prevent future damage Outcome: Progressing Goal: Knowledge of disease or condition will improve Outcome: Progressing Goal: Knowledge of the prescribed therapeutic regimen will improve Outcome: Progressing Goal: Individualized Educational Video(s) Outcome: Progressing   Problem: Activity: Goal: Risk for activity intolerance will decrease Outcome: Progressing   Problem: Cardiac: Goal: Will achieve and/or maintain hemodynamic stability Outcome: Progressing   Problem: Clinical Measurements: Goal: Postoperative complications will be avoided or minimized Outcome: Progressing   Problem: Respiratory: Goal: Respiratory status will improve Outcome: Progressing   Problem: Skin Integrity: Goal: Wound healing without signs and symptoms of infection Outcome: Progressing Goal: Risk for impaired skin integrity will decrease Outcome: Progressing   Problem: Urinary Elimination: Goal: Ability to achieve and maintain adequate renal perfusion and functioning will improve Outcome: Progressing

## 2019-05-07 MED ORDER — POTASSIUM CHLORIDE 20 MEQ/15ML (10%) PO SOLN
20.0000 meq | Freq: Once | ORAL | Status: AC
  Administered 2019-05-07: 20 meq via ORAL
  Filled 2019-05-07: qty 15

## 2019-05-07 NOTE — Progress Notes (Addendum)
Inpatient Rehabilitation Admissions Coordinator  I met with patient and his wife at bedside. W discussed goals and expectations of a possible inpt rehab admit. He is an excellent candidate with good family support. I will begin insurance authorization with [employer] for a possible CIR admit pending insurance approval.  Danne Baxter, RN, MSN Rehab Admissions Coordinator [phone] 05/07/2019 1:53 PM

## 2019-05-07 NOTE — Progress Notes (Signed)
-Physical Therapy Treatment Patient Details Name: Erik Parker MRN: [id number] DOB: October 25, 1965 Today's Date: 05/07/2019    History of Present Illness 54 y/o h/o HTN and HL. Presented to [facility] on 3/2 with acute CVA. CTA head showed a left frontal operculum acute nonhemorrhagic infarct. Patient was noted to also have a dissection within the asending aorta on the lower cuts of the head/neck CT. Now s/p AAA.    PT Comments    Patient progressing well towards PT goals. Reports pain along sternum today but motivated to work with therapy. Continues to require Mod A for trunk support during bed mobility while adhering to sternal precautions and Min A to stand from surfaces using momentum and holding heart pillow. Tolerated gait training with Min A with balance deficits noted with head turns and stepping over objects. Noted to have guarded gait with decreased step lengths bilaterally. Right knee instability present after standing at sink for a few minutes but no buckling. Reviewed sternal precautions. Continues to be frustrated with expressive aphasia; attempting to write some to communicate. Would be a great CIR candidate. Will continue to follow.   Follow Up Recommendations  CIR;Supervision/Assistance - 24 hour     Equipment Recommendations  Other (comment)(defer to post acute)    Recommendations for Other Services       Precautions / Restrictions Precautions Precautions: Sternal;Fall Precaution Booklet Issued: No Precaution Comments: Reviewed sternal precautions; expressive aphasia. Able to write a little to communicate Restrictions Weight Bearing Restrictions: Yes Other Position/Activity Restrictions: sternal precautions    Mobility  Bed Mobility Overal bed mobility: Needs Assistance Bed Mobility: Supine to Sit     Supine to sit: Mod assist;HOB elevated     General bed mobility comments: Able to bring LEs to EOB, assist with trunk; pt holding heart pillow. Cues to refrain from using  arms to scoot forwards/backwards.  Transfers Overall transfer level: Needs assistance Equipment used: None Transfers: Sit to/from Stand Sit to Stand: Min assist;Min guard         General transfer comment: Pt using momentum to stand from EOB x2, Min A to steady in standing. Holding heart pillow during transfer. Transferred to chair post ambulation.  Ambulation/Gait Ambulation/Gait assistance: Min guard;Min assist Gait Distance (Feet): 100 Feet Assistive device: None Gait Pattern/deviations: Step-to pattern;Step-through pattern;Decreased step length - right;Decreased step length - left;Narrow base of support Gait velocity: reduced Gait velocity interpretation: <1.31 ft/sec, indicative of household ambulator General Gait Details: Slow, guarded gait with step to pattern progressing to step through pattern; requires Min A for balance challenges (head turns).   Stairs             Wheelchair Mobility    Modified Rankin (Stroke Patients Only) Modified Rankin (Stroke Patients Only) Pre-Morbid Rankin Score: No symptoms Modified Rankin: Moderately severe disability     Balance Overall balance assessment: Needs assistance Sitting-balance support: Feet supported;No upper extremity supported Sitting balance-Leahy Scale: Good Sitting balance - Comments: supervision   Standing balance support: During functional activity Standing balance-Leahy Scale: Fair Standing balance comment: reliant on external support when standing at sink; able to perform ADL task at sink, noted to have right knee instability after standing for a few mins but no buckling.             High level balance activites: Head turns High Level Balance Comments: Pt had difficulty with head turns and stepping over objects requiring Min A for support; decreased gait speed as well.  Cognition Arousal/Alertness: Awake/alert Behavior During Therapy: WFL for tasks assessed/performed Overall Cognitive  Status: Difficult to assess                                 General Comments: pt with significant expressive difficulties, but able to appropriately follow commands and answer yes/no questions. Pt frustrated. Able to write some.      Exercises      General Comments General comments (skin integrity, edema, etc.): VSS on RA.      Pertinent Vitals/Pain Pain Assessment: 0-10 Pain Score: 5  Faces Pain Scale: Hurts little more Pain Location: chest Pain Descriptors / Indicators: Discomfort;Sore Pain Intervention(s): Repositioned;Monitored during session;Patient requesting pain meds-RN notified    Home Living                      Prior Function            PT Goals (current goals can now be found in the care plan section) Progress towards PT goals: Progressing toward goals    Frequency    Min 4X/week      PT Plan Current plan remains appropriate    Co-evaluation PT/OT/SLP Co-Evaluation/Treatment: Yes Reason for Co-Treatment: For patient/therapist safety PT goals addressed during session: Mobility/safety with mobility        AM-PAC PT "6 Clicks" Mobility   Outcome Measure  Help needed turning from your back to your side while in a flat bed without using bedrails?: A Little Help needed moving from lying on your back to sitting on the side of a flat bed without using bedrails?: A Lot Help needed moving to and from a bed to a chair (including a wheelchair)?: A Little Help needed standing up from a chair using your arms (e.g., wheelchair or bedside chair)?: A Little Help needed to walk in hospital room?: A Little Help needed climbing 3-5 steps with a railing? : A Lot 6 Click Score: 16    End of Session Equipment Utilized During Treatment: Gait belt Activity Tolerance: Patient tolerated treatment well Patient left: in chair;with call bell/phone within reach;with chair alarm set Nurse Communication: Mobility status PT Visit Diagnosis: Other  abnormalities of gait and mobility (R26.89);Unsteadiness on feet (R26.81);Muscle weakness (generalized) (M62.81);Hemiplegia and hemiparesis Hemiplegia - Right/Left: Right Hemiplegia - dominant/non-dominant: Dominant Hemiplegia - caused by: Cerebral infarction     Time: 0932-1002 PT Time Calculation (min) (ACUTE ONLY): 30 min  Charges:  $Gait Training: 8-22 mins                     Vale Haven, PT, DPT Acute Rehabilitation Services Pager [phone] Office [phone]       Erik Parker 05/07/2019, 1:32 PM

## 2019-05-07 NOTE — Discharge Summary (Signed)
Physician Discharge Summary  Patient ID: Erik Parker MRN: [id number] DOB/AGE: [id number] 52 y.o.  Admit date: 05/01/2019 Discharge date: 05/09/2019  Admission Diagnoses: Aortic dissection, Stanford Type A Acute CVA  Discharge Diagnoses:   Aortic dissection (HCC) S/P ascending aortic replacement Cerebral embolism with cerebral infarction Acute cerebrovascular accident Monomorphic ventricular tachycardia Essential hypertension Dyslipidemia Tobacco abuse Tachypnea Leukocytosis   Discharged Condition: stable  History of present Illness: Erik Parker is a 54yo male with a history of hypertension, hyperlipidemia, tobacco use, who presented to the ED at [facility] on 04/30/19 with chest pain. His EKG showed no ischemic changes and serial HS troponin was unremarkable. He was discharged from the ED that day and was found by his wife (who was returning home after working 3rd shift) the following morning with a right facial droop and right upper extremity weakness. He was returned to the [facility] ED via EMS. Code stroke was initiated.  Work up included a CTA of the head and neck that incidentally also imaged the aortic arch revealing an aortic dissection.  He was accepted in transfer by Dr. Darcey Nora and was brought to [facility] and immediately prepared for surgery by Dr. Orvan Seen.   Hospital Course:  Erik Parker was prepared and taken to the operating room.  Repair of Stanford type a aortic dissection with aortic valve resuspension and distal arch reconstruction was 28 mm Dacron graft was carried out utilizing hypothermic circulatory arrest and antegrade cerebral perfusion.  Following the procedure, patient separated from cardiopulmonary bypass without difficulty and was sufficiently hemostatic.  He was transferred to the cardiovascular ICU.  He remained hemodynamically stable.  He was weaned from the ventilator and was extubated just after midnight following surgery.  The neurology service  was consulted for assistance with managing his acute CVA.  Physical therapy, Occupational Therapy, and speech therapy also were closely involved with his care postoperatively.  Tube feeding was initiated with plans to continue nocturnal feeding until he was able to support at least 75% of his caloric needs with oral intake.  On the second postoperative day, he had a run of monomorphic ventricular tachycardia lasting several seconds.  Dr. Haroldine Laws of the cardiology service was consulted.  Patient was treated conservatively with beta-blocker therapy.  Potassium and magnesium were also optimized.  He did not have any further arrhythmias.  He was able to resume ambulation with a walker.  He was weaned from supplemental oxygen without difficulty.  He was evaluated by the inpatient rehab team and was felt to be an appropriate candidate for further therapy in the inpatient rehab setting.  Insurance approval was obtained the patient was ready for transfer on 05/09/19.    Consults: Neurology, Cardiology  Significant Diagnostic Studies:  CT ANGIOGRAPHY HEAD AND NECK  CT PERFUSION BRAIN  TECHNIQUE: Multidetector CT imaging of the head and neck was performed using the standard protocol during bolus administration of intravenous contrast. Multiplanar CT image reconstructions and MIPs were obtained to evaluate the vascular anatomy. Carotid stenosis measurements (when applicable) are obtained utilizing NASCET criteria, using the distal internal carotid diameter as the denominator.  Multiphase CT imaging of the brain was performed following IV bolus contrast injection. Subsequent parametric perfusion maps were calculated using RAPID software.  CONTRAST:  131mL OMNIPAQUE IOHEXOL 350 MG/ML SOLN  COMPARISON:  Plain head CT 0658 hours today.  FINDINGS: CT Brain Perfusion Findings:  ASPECTS: 8/10  CBF (<30%) Volume: 60mL (underestimated)  Perfusion (Tmax>6.0s) volume: 68mL - which appears to  correspond  to the area of left operculum cytotoxic edema by plain CT.  Mismatch Volume: Unclear  Infarction Location:Left operculum  CTA NECK  Skeleton: No acute osseous abnormality identified.  Upper chest: No superior mediastinal lymphadenopathy. Negative visible upper lungs.  Other neck: Negative for neck mass or lymphadenopathy.  Aortic arch: Positive for a Type A Aortic Dissection, with intimal flap in the ascending aorta on series 5, image 184. the dissection continues through the arch into the visible proximal descending aorta, and involves although great vessel origins.  Right carotid system: Dissected brachiocephalic artery and subtotal thrombosed right CCA origin (series 5, image 155). Dissection and/or thrombus related RADIOGRAPHIC STRING SIGN stenosis of the right CCA at the level of the supraglottic larynx (series 5, image 119). However, the dissection then appears to terminates proximal to the right carotid bifurcation which appears widely patent. Some soft plaque suspected in the proximal right ICA, no definite right ICA dissection and no significant stenosis.  Left carotid system: Dissection tracks through the origin of the left CCA and through the left carotid bifurcation into the left ICA (series 5, image 103). The flap then appears to terminates at a level just distal to the left ICA bulb. Overall moderate stenosis of the left CCA and ICA results. The left ICA appears fairly normal at the skull base.  Vertebral arteries: The right subclavian artery origin is less affected by the dissection. Normal right vertebral artery origin. The right vertebral artery appears patent and normal to the skull base.  The dissection tracks through the proximal left subclavian artery but appears to terminates proximal to the left vertebral artery origin. The left vertebral origin appears within normal limits. And the left vertebral artery appears patent and  normal to the skull base.  CTA HEAD  Posterior circulation: Codominant distal vertebral arteries with no stenosis. Patent vertebrobasilar junction. Both a ICAs appear dominant. Patent basilar artery without stenosis. Patent SCA origins. Fetal type left PCA. Patent right PCA origin. Diminutive right posterior communicating artery. Bilateral PCA branches are within normal limits.  Anterior circulation: Both ICA siphons are patent. On the left there is no significant plaque or stenosis with normal left ophthalmic and posterior communicating artery origins. On the right there is mild ICA siphon plaque but no significant stenosis. Normal right ophthalmic and posterior communicating artery origins. Patent carotid termini. Anterior communicating artery and bilateral ACA branches are within normal limits. Right MCA M1 segment and bifurcation are patent without stenosis. Right MCA branches are within normal limits.  Left MCA M1 segment and bifurcation are patent without stenosis. No left MCA M2 branch occlusion is identified. There is a subtle paucity of left anterior operculum branches, but no discrete branch occlusion can be identified.  Venous sinuses: Patent.  Anatomic variants: Fetal type left PCA origin.  Review of the MIP images confirms the above findings  IMPRESSION: 1. Positive for a Stanford Type A Aortic Dissection, which severely involves both carotid arteries. The dissection extends extending farther into the Left Carotid (left ICA bulb) but results in a Radiographic-String-Sign stenosis of the Right CCA. Critical Value/emergent results were called by telephone to Dr. Meridee Score on 05/01/2019 at 08:12 .  2. No superimposed intracranial emergent large vessel occlusion.  3. CT Brain Perfusion may be unreliable secondary to #1, but does not suggest ischemic penumbra outside of the left operculum core infarct visible by plain CT (ASPECTS  8).   Electronically Signed   By: Odessa Fleming M.D.   On: 05/01/2019 08:26  Treatments:   CARDIOTHORACIC SURGERY OPERATIVE NOTE  Date of Procedure:    05/01/2019  Preoperative Diagnosis:      Severe 3-vessel Coronary Artery Disease  Postoperative Diagnosis:    Same  Procedure:     Repair of Stanford Type  A aortic dissection with aortic valve resuspension and distal hemiarch reconstruction with 28 mm Dacron graft under hypothermic circulatory arrest (32 minutes) using antegrade cerebral perfusion (31 minutes); axillary artery cannulation; TEE Rigid sternal reconstruction with linear plating system  Surgeon:        B. Lorayne Marek, MD  Assistant:       Raford Pitcher, E PA-C  Anesthesia:    get  Operative Findings: ? normal biventricular systolic function pre/post bypass ? Primary tear at the level of anterior STJ ? Moderate-to-severe severe AI pre-repair; no AI after procedure    BRIEF CLINICAL NOTE AND INDICATIONS FOR SURGERY  54 yo man with h/o HTN presented yesterday with nonspecific chest pain and was discharged home. Returned early this am with aphasia, new onset. Evaluation demonstrated left brain CVA and dissection within ascending aorta on lower cuts of head/neck CT. Transferred urgently for repair of Stanford Type A dissection.   Discharge Exam: Blood pressure 128/81, pulse 98, temperature 98.8 F (37.1 C), temperature source Oral, resp. rate 17, height 6' (1.829 m), weight 105.9 kg, SpO2 98 %.  Physical Exam General appearance:alert, cooperative and no distress Neurologic: right facial droop not as evident today. Still has expressive dysphasia.  Heart:regular rate and rhythm Lungs:Breath sounds are clear. Abdomen:soft and non-tender Extremities:all warm and well perfused with palpable pulses Wound:the sternal incision is well approximated with a few areas of dry bloody drainage.  Disposition:  Mr. Sanderfer is discharged to inpatient rehab in stable  condition.    Allergies as of 05/09/2019   No Known Allergies     Medication List    STOP taking these medications   amLODipine 10 MG tablet Commonly known as: NORVASC   famotidine 20 MG tablet Commonly known as: PEPCID   folic acid 1 MG tablet Commonly known as: FOLVITE   hydrochlorothiazide 25 MG tablet Commonly known as: HYDRODIURIL   lovastatin 40 MG tablet Commonly known as: MEVACOR   methotrexate (PF) 50 MG/2ML injection   predniSONE 20 MG tablet Commonly known as: DELTASONE   sucralfate 1 g tablet Commonly known as: Carafate     TAKE these medications   acetaminophen 160 MG/5ML solution Commonly known as: TYLENOL Place 20.3 mLs (650 mg total) into feeding tube every 6 (six) hours as needed.   aspirin 325 MG EC tablet Take 1 tablet (325 mg total) by mouth daily. What changed:   medication strength  how much to take   colchicine 0.6 MG tablet Take 1 tablet (0.6 mg total) by mouth 2 (two) times daily.   docusate sodium 100 MG capsule Commonly known as: COLACE Take 2 capsules (200 mg total) by mouth daily.   feeding supplement (OSMOLITE 1.5 CAL) Liqd Place 1,000 mLs into feeding tube daily.   feeding supplement (PRO-STAT SUGAR FREE 64) Liqd Place 30 mLs into feeding tube 2 (two) times daily.   influenza vac split quadrivalent PF 0.5 ML injection Commonly known as: FLUARIX Inject 0.5 mLs into the muscle once for 1 dose.   metoprolol tartrate 25 MG tablet Commonly known as: LOPRESSOR Take 1 tablet (25 mg total) by mouth 2 (two) times daily.   pantoprazole 40 MG tablet Commonly known as: Protonix Take 1 tablet (40 mg total) by  mouth daily.   pneumococcal 23 valent vaccine 25 MCG/0.5ML injection Commonly known as: PNEUMOVAX-23 Inject 0.5 mLs into the muscle once for 1 dose.   Resource ThickenUp Clear Powd Take 125 g by mouth as needed.   rosuvastatin 20 MG tablet Commonly known as: CRESTOR Take 1 tablet (20 mg total) by mouth daily at 6  PM.   traMADol 50 MG tablet Commonly known as: ULTRAM Take 1 tablet (50 mg total) by mouth every 4 (four) hours as needed for moderate pain.      Follow-up Information    Linden Dolin, MD. Go on 05/21/2019.   Specialty: Cardiothoracic Surgery Why: You have an apppointment with Dr. Vickey Sages on Monday 05/21/19 at 1:00pm. Please arrive 30 minutes early for a chest xray to be performed at [facility] located on the first floor of the same building.  Contact information: [address] [place] [address] [phone]         A request for a cardiology follow up appointment has been made and is pending at this time. Please assure he has general cardiology follow up prior to discharge from CIR.   Signed: Leary Roca, PA-C 05/09/2019, 9:35 AM

## 2019-05-07 NOTE — Discharge Instructions (Signed)
Discharge Instructions:  1. You may shower, please wash incisions daily with soap and water and keep dry.  If you wish to cover wounds with dressing you may do so but please keep clean and change daily.  No tub baths or swimming until incisions have completely healed.  If your incisions become red or develop any drainage please call our office at [phone]  2. No Driving until cleared by Dr. Orvan Seen' office and you are no longer using narcotic pain medications  3. Monitor your weight daily.. Please use the same scale and weigh at same time... If you gain 5-10 lbs in 48 hours with associated lower extremity swelling, please contact our office at [phone]  4. Fever of 101.5 for at least 24 hours with no source, please contact our office at [phone]  5. Activity- up as tolerated, please walk at least 3 times per day.  Avoid strenuous activity, no lifting, pushing, or pulling with your arms over 8-10 lbs for a minimum of 6 weeks  6. If any questions or concerns arise, please do not hesitate to contact our office at [phone]  Aortic Dissection  Aortic dissection happens when there is a tear in the wall of the body's main blood vessel (aorta). The aorta leads out of the heart (ascending aorta), curves around, and then goes down the chest (descending aorta) and into the abdomen to supply arteries with blood. The wall of the aorta has inner and outer layers. As blood collects along the tear, one part of the aorta continues to carry blood to the body, but blood can also flow into the tear between the layers of the aorta. The torn part of the aorta fills with blood and swells. This can reduce blood flow through the part of the aorta that is still supplying blood to the body. Aortic dissection is a medical emergency. What are the causes? This condition is commonly caused by weakening of the artery wall due to high blood pressure. Other causes may include:  An injury, such as from a car  crash.  A complication from heart surgery or from a diagnostic procedure called coronary catheterization.  Weakness of the artery wall due to birth defects that affect the connective tissues, such as Marfan syndrome. In some cases, the cause is not known. What increases the risk? The following factors may make you more likely to develop this condition:  Having certain medical conditions, such as: ? High blood pressure (hypertension). ? Hardening and narrowing of the arteries (atherosclerosis). ? A condition that causes inflammation of blood vessels, such as giant cell arteritis.  Having two cusps in the aortic valve instead of three (bicuspid aortic valve).  Having a bulge in the wall of the aorta (aortic aneurysm).  Being male.  Being pregnant.  Being older than age 49.  Using cocaine.  Smoking.  Lifting heavy weights or doing other types of strength training (high-intensity resistance training). What are the signs or symptoms? Signs and symptoms of aortic dissection start suddenly. The most common symptoms are:  Severe chest pain that may feel like tearing, stabbing, or sharp pain.  Severe pain that spreads (radiates) to the back, neck, jaw, or abdomen.  Severe pain between the shoulder blades in the back. Other symptoms may include:  Trouble breathing.  Dizziness or fainting.  Sudden weakness on one side of the body.  Nausea or vomiting.  Trouble swallowing.  Coughing up blood.  Vomiting blood.  Clammy skin. How is this diagnosed? This condition may  be diagnosed based on:  Your symptoms and a physical exam. This may include: ? Listening for abnormal blood flow sounds (murmurs) in your chest or abdomen. ? Checking your pulse in your arms and legs. ? Checking your blood pressure to see whether it is low, or whether there is a difference between the measurements (readings) from your right arm and left arm.  Electrocardiogram (ECG). This test measures the  electrical activity in your heart.  Chest X-ray.  CT scan.  MRI.  Echocardiogram. This uses sound waves to make images of your heart.  Blood tests. How is this treated? It is important to treat aortic dissection as quickly as possible. Treatment may start as soon as your health care provider thinks that you have aortic dissection. Treatment depends on where the dissection is, how severe it is, and your overall health. Treatment may include:  Medicines to lower your heart rate and blood pressure.  Surgery to repair your aorta using artificial material (syntheticgraft).  A procedure to insert a stent-graft into the aorta (endovascular procedure). During this procedure: 1. A long, thin tube (stent) is inserted into an artery near the groin (femoral artery). 2. The stent is moved up to the damaged part of the aorta. 3. The stent is opened to help improve blood flow and prevent future dissection. Your health care provider may refer you to a specialized treatment center. Follow these instructions at home: If you had surgery, follow instructions from your health care provider about home care after the procedure. Activity  Do not lift anything that is heavier than 10 lb (4.5 kg), or the limit that you are told, until your health care provider says that it is safe.  Avoid activities that could injure your chest or abdomen. Ask your health care provider what activities are safe for you.  After you have recovered, try to stay active. Ask your health care provider what activities are safe for you after recovery.  Enroll in cardiac rehabilitation. This is a program that helps to improve your health and well-being. It includes exercise training, education, and counseling to help you recover. Lifestyle      Eat a heart-healthy diet, which includes lots of fresh fruits and vegetables, low-fat (lean) protein, and whole grains.  Work with your health care provider to treat any other  conditions that you may have, such as obesity, high blood pressure, or diabetes.  Do not use any products that contain nicotine or tobacco, such as cigarettes, e-cigarettes, and chewing tobacco. If you need help quitting, ask your health care provider. General instructions  Take over-the-counter and prescription medicines only as told by your health care provider.  Talk with your health care provider about how to manage stress.  Keep all follow-up visits as told by your health care provider. This is important. Get help right away if you:  Develop any symptoms of aortic dissection after treatment, including severe pain in your chest, back, or abdomen.  Have pain in your chest.  Have weakness in your arm or leg.  Have pain in your abdomen.  Have trouble breathing or you develop a cough.  Faint.  Develop a racing heartbeat (palpitations). These symptoms may represent a serious problem that is an emergency. Do not wait to see if the symptoms will go away. Get medical help right away. Call your local emergency services (911 in the [place] Do not drive yourself to the hospital. Summary  Aortic dissection happens when there is a tear in the wall  of the body's main blood vessel (aorta). It is a medical emergency.  The most common symptom is severe pain in the chest or pain that spreads (radiates) to the back, neck, jaw, or abdomen.  It is important to treat aortic dissection as quickly as possible. Treatment usually includes medicines and surgery.  Take over-the-counter and prescription medicines only as told by your health care provider. This information is not intended to replace advice given to you by your health care provider. Make sure you discuss any questions you have with your health care provider. Document Revised: 08/24/2017 Document Reviewed: 07/28/2017 Elsevier Patient Education  2020 [employer].

## 2019-05-07 NOTE — Progress Notes (Signed)
[id number] 74 SR, 132/72, 95%RA. Pt walked in hall with gait belt use and one asst and one to follow with chair. Reinforced sternal precautions. Gave IS and pt able to get to 1000-1250 ml with cues. 70LI,103/01, 99%RA. Back to bed after walk. Tired after short walk. Luetta Nutting RN BSN 05/07/2019 1:24 PM

## 2019-05-07 NOTE — Progress Notes (Addendum)
Advanced Heart Failure Rounding Note   Subjective:    Out of bed and up in chair. Remains aphasic. When asked if abdominal pain improved, he nodded "yes".   No further VT on tele.    Objective:   Weight Range:  Vital Signs:   Temp:  [97.7 F (36.5 C)-99 F (37.2 C)] 97.7 F (36.5 C) (03/08 0718) Pulse Rate:  [76-95] 91 (03/08 0718) Resp:  [16-21] 16 (03/08 0718) BP: (107-132)/(70-89) 119/71 (03/08 0718) SpO2:  [95 %-98 %] 95 % (03/08 0718) Weight:  [107.9 kg-108.3 kg] 108.3 kg (03/08 0435) Last BM Date: 05/06/19  Weight change: Filed Weights   05/05/19 0500 05/06/19 1230 05/07/19 0435  Weight: 108 kg 107.9 kg 108.3 kg    Intake/Output:   Intake/Output Summary (Last 24 hours) at 05/07/2019 0954 Last data filed at 05/07/2019 0454 Gross per 24 hour  Intake 2066.83 ml  Output 275 ml  Net 1791.83 ml     Physical Exam: General: Middle aged male, well nourished. Out of bed and in chair. No resp difficulty HEENT: normal mild R facial droop, + NGT Neck: supple. no JVD. Carotids 2+ bilat; no bruits. No lymphadenopathy or thryomegaly appreciated. Cor: Sternal wound ok. Regular rate & rhythm. No rubs, gallops or murmurs.  PMI nondisplaced Lungs: clear Abdomen: soft, nondistended. No hepatosplenomegaly. No bruits or masses. Good bowel sounds. Extremities: no cyanosis, clubbing, rash, edema Neuro: alert follows commands. Mildly weak on R apahsic   Telemetry: NSR 90s, no recurrent VT Personally reviewed   Labs: Basic Metabolic Panel: Recent Labs  Lab 05/03/19 0435 05/03/19 0435 05/03/19 1800 05/03/19 1800 05/04/19 0621 05/04/19 1234 05/04/19 1711 05/05/19 0343 05/05/19 2123 05/06/19 0322  NA 138  --  137  --  140  --   --  143  --  143  K 3.7  --  3.7  --  3.7  --   --  3.8  --  3.9  CL 106  --  105  --  107  --   --  108  --  109  CO2 25  --  22  --  23  --   --  26  --  26  GLUCOSE 135*  --  132*  --  109*  --   --  118*  --  95  BUN 7  --  9  --  15  --    --  20  --  23*  CREATININE 0.59*  --  0.73  --  0.69  --   --  0.66  --  0.64  CALCIUM 8.6*   < > 8.6*   < > 8.7*  --   --  8.7*  --  8.7*  MG  --   --   --   --  2.4 2.2 2.4 2.3 2.1  --   PHOS  --   --   --   --   --  3.1 4.1 3.4 3.9  --    < > = values in this interval not displayed.    Liver Function Tests: Recent Labs  Lab 04/30/19 1257 05/01/19 0700  AST 20 15  ALT 33 27  ALKPHOS 52 47  BILITOT 0.6 0.9  PROT 7.2 6.9  ALBUMIN 4.3 3.9   Recent Labs  Lab 04/30/19 1432  LIPASE 47   No results for input(s): AMMONIA in the last 168 hours.  CBC: Recent Labs  Lab 04/30/19 1257 04/30/19 1257 05/01/19 0700  05/01/19 0716 05/02/19 1624 05/03/19 0435 05/04/19 0621 05/05/19 0343 05/06/19 0322  WBC 15.6*   < > 16.2*   < > 18.9* 16.3* 12.5* 10.8* 10.5  NEUTROABS 11.1*  --  10.4*  --   --   --   --   --   --   HGB 14.9   < > 13.9   < > 11.1* 11.1* 10.9* 10.9* 11.1*  HCT 45.4   < > 42.7   < > 34.4* 33.6* 33.1* 33.7* 34.3*  MCV 87.5   < > 87.5   < > 87.5 87.5 87.1 88.9 88.2  PLT 307   < > 281   < > 221 197 224 272 331   < > = values in this interval not displayed.    Cardiac Enzymes: No results for input(s): CKTOTAL, CKMB, CKMBINDEX, TROPONINI in the last 168 hours.  BNP: BNP (last 3 results) Recent Labs    04/30/19 1257  BNP 31.0    ProBNP (last 3 results) No results for input(s): PROBNP in the last 8760 hours.    Other results:  Imaging: DG Chest 2 View  Result Date: 05/06/2019 CLINICAL DATA:  54 year old male with history of chest pain. EXAM: CHEST - 2 VIEW COMPARISON:  Chest x-ray 05/05/2019. FINDINGS: Previously noted right IJ Cordis has been removed. Feeding tube extending into the body of the stomach. Lung volumes are normal. No consolidative airspace disease. No pleural effusions. No pneumothorax. No pulmonary nodule or mass noted. Pulmonary vasculature is normal. Heart size is mildly enlarged. Upper mediastinal contours are within normal limits.  Status post median sternotomy. IMPRESSION: 1. Support apparatus, as above. 2. No radiographic evidence of acute cardiopulmonary disease. 3. Mild cardiomegaly. Electronically Signed   By: Trudie Reed M.D.   On: 05/06/2019 06:11   DG Abd Portable 1V  Result Date: 05/06/2019 CLINICAL DATA:  Abdominal pain EXAM: PORTABLE ABDOMEN - 1 VIEW COMPARISON:  05/04/2019 FINDINGS: Enteric tube terminates within the stomach. Other lines and tubes are no longer present. Bowel gas pattern is unremarkable. No significant stool burden. Minimal residual contrast within the colon. IMPRESSION: Enteric tube within the stomach.  Unremarkable bowel gas pattern. Electronically Signed   By: Guadlupe Spanish M.D.   On: 05/06/2019 09:28     Medications:     Scheduled Medications: . acetaminophen (TYLENOL) oral liquid 160 mg/5 mL  650 mg Per Tube Once   Or  . acetaminophen  650 mg Rectal Once  . aspirin EC  325 mg Oral Daily   Or  . aspirin  324 mg Per Tube Daily  . bisacodyl  10 mg Oral Daily   Or  . bisacodyl  10 mg Rectal Daily  . Chlorhexidine Gluconate Cloth  6 each Topical Daily  . colchicine  0.6 mg Oral BID  . docusate sodium  200 mg Oral Daily  . feeding supplement (OSMOLITE 1.5 CAL)  1,000 mL Per Tube Q24H  . feeding supplement (PRO-STAT SUGAR FREE 64)  30 mL Per Tube BID  . folic acid  1 mg Oral Daily  . mouth rinse  15 mL Mouth Rinse BID  . metoCLOPramide (REGLAN) injection  10 mg Intravenous Q8H  . metoprolol tartrate  25 mg Oral BID  . pantoprazole (PROTONIX) IV  40 mg Intravenous Q24H  . rosuvastatin  20 mg Oral q1800  . sodium chloride flush  3 mL Intravenous Q12H    Infusions: . sodium chloride    . sodium chloride Stopped (05/04/19 0539)  PRN Medications: sodium chloride, influenza vac split quadrivalent PF, ondansetron (ZOFRAN) IV, oxyCODONE, pneumococcal 23 valent vaccine, Resource ThickenUp Clear, sodium chloride flush, traMADol   Assessment/Plan:   1. Monomorphic VT on 3/4  (23 beats) - obviously the main question here is if this is ischemic-mediated or not as coronaries were not able to be evaluated in setting of acute dissection. On pre-op studies and in OR no evidence of RCA involvement - stat echo on 3/4 EF 45-50%  no regional wall motion abnormalities to suggest ACS - hstrop moderately elevated but flat in setting of heart surgery, Not c/w ACS - VT quiescent.  - will continue to follow on tele Keep K> 4.0, Mg > 2.0 - continue metoprolol 25 mg bid  - No evidence of ACS or ongoing ischemia. not good candidate for invasive eval with recent dissection -> [place] and acute CVA. Can consider cardiac CTA at some point  if HR acceptable and he can cooperate with testing  2. 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 - stable by echo  - no change.  - has diuresed well. Weight below baseline. No diuretic requirement currenlty  3. Acute post-op pericarditis - continue with colchicine  4. CVA, acute with severe aphasia - Neuro following - improving slowly. Still weak on Rt side - Neuro and CIR following  5. HTN - BP ok.   6. Ab pain - improving - continue reglan. Abd [place] 3/7 unremarkable   Length of Stay: 6   Brittainy Delmer Islam 05/07/2019, 9:54 AM  Advanced Heart Failure Team Pager [phone] (M-F; 7a - 4p)  Please contact [facility] for night-coverage after hours (4p -7a ) and weekends on [email or web address]  Patient seen and examined with the above-signed Advanced Practice Provider and/or Housestaff. I personally reviewed laboratory data, imaging studies and relevant notes. I independently examined the patient and formulated the important aspects of the plan. I have edited the note to reflect any of my changes or salient points. I have personally discussed the plan with the patient and/or family.  Feeling better. Looks good today. No further ab pain. BP stable. No further VT. Remains aphasic.  Mild right facial droop and  mild RUE weakness on exam. No JVD or edema.   Continue current regimen. No need for ischemic w/u at this point. OK for CIR from our standpoint.   Arvilla Meres, MD  7:23 PM

## 2019-05-07 NOTE — Progress Notes (Signed)
  Speech Language Pathology Treatment: Dysphagia;Cognitive-Linquistic  Patient Details Name: Erik Parker MRN: [id number] DOB: 09-Nov-1965 Today's Date: 05/07/2019 Time: 1105-1130 SLP Time Calculation (min) (ACUTE ONLY): 25 min  Assessment / Plan / Recommendation Clinical Impression  Pt was seen for skilled ST targeting dysphagia and aphasia/apraxia.  Pt was encountered awake/alert with his wife present at bedside and he was pleasant and cooperative throughout his tx session.  Via yes/no questions, pt reported that he was tolerating his diet fairly well, but that he had intermittent coughing particularly when consuming drinks.  He was seen with trials of nectar-thick liquid, puree, and soft solids.  He exhibited good bolus acceptance and timely AP transport with all trials; however, he exhibited an immediate cough following the soft solid trial and an immediate throat clear following 2/5 trials of puree.  No overt s/sx of aspiration were observed with 4oz of nectar-thick liquid.  Recommend continuation of Dysphagia 1 (puree) solids and nectar-thick liquids with close monitoring by the SLP and strict adherence to compensatory strategies.    Pt completed expressive and receptive language tasks in this session.  Receptive language is a relative strength for the pt. He answered yes/no questions and followed 1-3 step commands with 100% accuracy independently.  He additionally completed an automatic speech task (counting from 1-5) given a visual and auditory model and he was able to state 3 of the numbers correctly across 4 trials.  He completed basic confrontational naming tasks with 2/10 accuracy independently, improving to 5/10 given phonemic and semantic cues.  Pt's wife was educated regarding phonemic and semantic cues in order for her to practice confrontational tasks with the pt throughout the day.  Pt completed repetition tasks with 50% accuracy and phonemic paraphasias were observed.  He additionally was  able to enhance communication via written language.  Pt was encouraged to continue using written language to assist with communication breakdowns.  Recommend additional ST 5x weekly high intensity (CIR) at time of discharge.  SLP will continue to f/u per POC.    HPI HPI: Erik Parker is a 54 y.o. male with history of hypertension, hyperlipidemia.  Patient apparently was seen in the emergency department on 05/01/2019 for right facial droop and inability to speak along with right arm weakness. Currently presnts with expressive aphasia. No h/o swallowing difficulties.      SLP Plan  Continue with current plan of care       Recommendations  Diet recommendations: Dysphagia 1 (puree);Nectar-thick liquid Liquids provided via: Cup;Straw Medication Administration: Whole meds with puree(Crush large pills ) Supervision: Staff to assist with self feeding;Intermittent supervision to cue for compensatory strategies Compensations: Slow rate;Small sips/bites Postural Changes and/or Swallow Maneuvers: Seated upright 90 degrees                General recommendations: Rehab consult Oral Care Recommendations: Oral care BID Follow up Recommendations: Inpatient Rehab SLP Visit Diagnosis: Dysphagia, oropharyngeal phase (R13.12);Aphasia (R47.01) Plan: Continue with current plan of care       GO               Villa Herb., M.S., CCC-SLP Acute Rehabilitation Services Office: [phone]  Shanon Rosser [facility] 05/07/2019, 12:16 PM

## 2019-05-07 NOTE — Progress Notes (Signed)
6 Days Post-Op Procedure(s) (LRB): REPAIR OF ACUTE ASCENDING THORACIC AORTIC DISSECTION USING HEMOSHIELD GRAFT SIZE (N/A) Subjective: Resting bed with his wife at the bedside. They responded that they are pleased with his progress and had no new concerns.  He is tolerating a puree diet and is having bowel movements.  Objective: Vital signs in last 24 hours: Temp:  [97.7 F (36.5 C)-99 F (37.2 C)] 98.2 F (36.8 C) (03/08 1100) Pulse Rate:  [77-95] 77 (03/08 1100) Cardiac Rhythm: Normal sinus rhythm (03/08 1200) Resp:  [14-21] 14 (03/08 1100) BP: (111-132)/(70-89) 117/78 (03/08 1100) SpO2:  [95 %-100 %] 100 % (03/08 1100) Weight:  [108.3 kg] 108.3 kg (03/08 0435)   Intake/Output from previous day: 03/07 0701 - 03/08 0700 In: 2066.8 [P.O.:960; I.V.:20; NG/GT:1086.8] Out: 275 [Urine:275] Intake/Output this shift: Total I/O In: 120 [P.O.:120] Out: -   General appearance: alert, cooperative and no distress Neurologic: mild right facial droop noted. Full neuro exam not performed.  Heart: regular rate and rhythm Lungs: Breath sounds are clear.  Abdomen: soft and non-tender Extremities: all warm and well perfused with palpable pulses Wound: the sternal incision is well approximated and dry.   Lab Results: Recent Labs    05/05/19 0343 05/06/19 0322  WBC 10.8* 10.5  HGB 10.9* 11.1*  HCT 33.7* 34.3*  PLT 272 331   BMET:  Recent Labs    05/05/19 0343 05/06/19 0322  NA 143 143  K 3.8 3.9  CL 108 109  CO2 26 26  GLUCOSE 118* 95  BUN 20 23*  CREATININE 0.66 0.64  CALCIUM 8.7* 8.7*    PT/INR: No results for input(s): LABPROT, INR in the last 72 hours. ABG    Component Value Date/Time   PHART 7.441 05/02/2019 0706   HCO3 23.1 05/02/2019 0706   TCO2 24 05/02/2019 0706   ACIDBASEDEF 1.0 05/02/2019 0409   O2SAT 96.0 05/02/2019 0706   CBG (last 3)  Recent Labs    05/05/19 0748 05/05/19 1152 05/05/19 1605  GLUCAP 136* 127* 98    Assessment/Plan: S/P  Procedure(s) (LRB): REPAIR OF ACUTE ASCENDING THORACIC AORTIC DISSECTION USING HEMOSHIELD GRAFT SIZE (N/A)  -POD6 repair of Type A aortic dissection in a 54yo male presenting with an acute CVA.  He is progressing with mobility and po intake. Ready for transfer to inpatient rehab from surgical standpoint.   -Post-op VT on POD-2- appreciate evaluation by cardiology: "No evidence of ACS or ongoing ischemia".  He has had no further arhythmias. Continuing metoprolol. Recheck K+ and Mg++ in am.   -Expected acute blood loss anemia- Hct trending up   LOS: 6 days    Leary Roca, PA-C [phone] 05/07/2019

## 2019-05-07 NOTE — Progress Notes (Signed)
Occupational Therapy Treatment Patient Details Name: Erik Parker MRN: [id number] DOB: 1965/07/23 Today's Date: 05/07/2019    History of present illness 54 y/o h/o HTN and HL. [facility]ed [facility] on [facility]2 with acute CVA. CTA head showed a left frontal operculum acute nonhemorrhagic infarct. Patient was noted to also have a dissection within the asending aorta on the lower cuts of the head/neck CT. Now s/p AAA.   OT comments  Pt progressing well with therapy. Pt presents with language deficits, decreased motor planning, and overall poor activity tolerance with sternal pain. Pt was standing x6 mins for face washing, brushing teeth and washing hands with overall minguardA for stability. RLE slightly buckling per PT as pt weight bearing at sink in standing. Pt with fair safety awareness in hallway avoiding obstacles with no AD, but minA overall for safety; chair follow due to weakness on R side. Pt opening containers, but continues to require increased assist overall for ADL due to sternal precautions and sternal pain. Pt would benefit from continued OT skilled services for ADL, mobility and safety. OT following acutely.    Follow Up Recommendations  CIR    Equipment Recommendations  Other (comment)(to be determined)    Recommendations for Other Services      Precautions / Restrictions Precautions Precautions: Sternal;Fall Precaution Booklet Issued: No Precaution Comments: Reviewed sternal precautions; expressive aphasia. Able to write a little to communicate Restrictions Weight Bearing Restrictions: Yes Other Position/Activity Restrictions: sternal precautions       Mobility Bed Mobility Overal bed mobility: Needs Assistance Bed Mobility: Supine to Sit     Supine to sit: Mod assist;HOB elevated     General bed mobility comments: Using heart pillow  Transfers Overall transfer level: Needs assistance Equipment used: None Transfers: Sit to/from Stand Sit to Stand: Min assist;Min  guard         General transfer comment: Pt using momentum to stand from EOB x2, Min A to steady in standing. Holding heart pillow during transfer. Transferred to chair post ambulation.    Balance Overall balance assessment: Needs assistance Sitting-balance support: Feet supported;No upper extremity supported Sitting balance-Leahy Scale: Good Sitting balance - Comments: supervision   Standing balance support: During functional activity Standing balance-Leahy Scale: Fair Standing balance comment: using RW for support at sink             High level balance activites: Head turns High Level Balance Comments: Pt had difficulty with head turns and stepping over objects requiring Min A for support; decreased gait speed as well.           ADL either performed or assessed with clinical judgement   ADL Overall ADL's : Needs assistance/impaired     Grooming: Min guard;Standing Grooming Details (indicate cue type and reason): standing x6 mins for face washing, brushing teeth and washing hands. RLE slightly buckling per PT as pt weight bearing.                 Toilet Transfer: Minimal assistance;Cueing for safety;Ambulation;RW           Functional mobility during ADLs: Minimal assistance;+2 for physical assistance;Cueing for sequencing;Cueing for safety;Rolling walker General ADL Comments: pt presents with language deficits, decreased motor planning, and overall poor activity tolerance with sternal pain     Vision   Vision Assessment?: No apparent visual deficits Additional Comments: Continue to assess   Perception     Praxis      Cognition Arousal/Alertness: Awake/alert Behavior During Therapy: [facility] for tasks  assessed/performed Overall Cognitive Status: Difficult to assess                                 General Comments: pt with significant expressive difficulties, but able to appropriately follow commands and answer yes/no questions. Pt  frustrated. Able to write some.        Exercises     Shoulder Instructions       General Comments Pt ambulating in hallway with Rw and chair follow for knee buckling and poor ability to communicate needs.    Pertinent Vitals/ Pain       Pain Assessment: Faces Pain Score: 5  Faces Pain Scale: Hurts little more Pain Location: chest Pain Descriptors / Indicators: Discomfort;Sore Pain Intervention(s): Repositioned  Home Living                                          Prior Functioning/Environment              Frequency  Min 2X/week        Progress Toward Goals  OT Goals(current goals can now be found in the care plan section)  Progress towards OT goals: Progressing toward goals  Acute Rehab OT Goals Patient Stated Goal: return to independence OT Goal Formulation: With patient Time For Goal Achievement: 05/18/19 Potential to Achieve Goals: Good ADL Goals Pt Will Perform Grooming: with supervision;standing Pt Will Perform Lower Body Bathing: with min assist;sit to/from stand;sitting/lateral leans Pt Will Perform Lower Body Dressing: with min assist;sitting/lateral leans;sit to/from stand Pt Will Transfer to Toilet: with min guard assist;ambulating;regular height toilet;grab bars Additional ADL Goal #1: Pt will recall and apply sternal precautions to BADLs with min VC's Additional ADL Goal #2: Pt will begin to make needs known via written or verbal communication to improve independent BADL activity  Plan Discharge plan remains appropriate    Co-evaluation    PT/OT/SLP Co-Evaluation/Treatment: Yes Reason for Co-Treatment: Complexity of the patient's impairments (multi-system involvement);To address functional/ADL transfers PT goals addressed during session: Mobility/safety with mobility OT goals addressed during session: ADL's and self-care      AM-PAC OT "6 Clicks" Daily Activity     Outcome Measure   Help from another person eating  meals?: A Little Help from another person taking care of personal grooming?: A Little Help from another person toileting, which includes using toliet, bedpan, or urinal?: A Little Help from another person bathing (including washing, rinsing, drying)?: A Lot Help from another person to put on and taking off regular upper body clothing?: A Little Help from another person to put on and taking off regular lower body clothing?: A Lot 6 Click Score: 16    End of Session Equipment Utilized During Treatment: Gait belt;Rolling walker  OT Visit Diagnosis: Unsteadiness on feet (R26.81);Other abnormalities of gait and mobility (R26.89);Muscle weakness (generalized) (M62.81);Cognitive communication deficit (R41.841);Pain Symptoms and signs involving cognitive functions: Cerebral infarction Pain - part of body: (chest)   Activity Tolerance Patient tolerated treatment well   Patient Left in chair;with call bell/phone within reach   Nurse Communication Mobility status        Time: 2831-5176 OT Time Calculation (min): 32 min  Charges: OT General Charges $OT Visit: 1 Visit OT Treatments $Self Care/Home Management : 8-22 mins  Flora Lipps, OTR/L Acute Rehabilitation Services Pager: [phone] Office: [phone]  Erik Parker 05/07/2019, 2:01 PM

## 2019-05-07 NOTE — Progress Notes (Signed)
Complained of pain and numbness on left shoulder and arm. Repositioned in bed, oxy 10 mg given and after few min. Claimed that numbness is gone. TCTS PA made aware with no order. Continue to monitor.

## 2019-05-08 LAB — BASIC METABOLIC PANEL
Anion gap: 9 (ref 5–15)
BUN: 16 mg/dL (ref 6–20)
CO2: 23 mmol/L (ref 22–32)
Calcium: 9 mg/dL (ref 8.9–10.3)
Chloride: 109 mmol/L (ref 98–111)
Creatinine, Ser: 0.65 mg/dL (ref 0.61–1.24)
GFR calc Af Amer: >60 mL/min (ref >60)
GFR calc non Af Amer: >60 mL/min (ref >60)
Glucose, Bld: 120 mg/dL — ABNORMAL HIGH (ref 70–99)
Potassium: 4.4 mmol/L (ref 3.5–5.1)
Sodium: 141 mmol/L (ref 135–145)

## 2019-05-08 LAB — MAGNESIUM: Magnesium: 2 mg/dL (ref 1.7–2.4)

## 2019-05-08 NOTE — Progress Notes (Signed)
Physical Therapy Treatment Patient Details Name: Erik Parker MRN: [id number] DOB: April 11, 1965 Today's Date: 05/08/2019    History of Present Illness 54 y/o h/o HTN and HL. Presented to [facility] on 3/2 with acute CVA. CTA head showed a left frontal operculum acute nonhemorrhagic infarct. Patient was noted to also have a dissection within the asending aorta on the lower cuts of the head/neck CT. Now s/p AAA.    PT Comments    Patient progressing well towards PT goals. Reports no pain today. Scored 16/24 on DGI indicating pt is at increased risk for falls. Tolerated higher level balance activities- walking backwards, side stepping, head turns and stepping over objects with some mild deviations in gait needing Min A for support. Difficulty with dual tasking during gait training having to stop and perform tasks. Continues to have expressive aphasia; can write to communicate. Great CIR candidate. Will continue to follow.    Follow Up Recommendations  CIR;Supervision/Assistance - 24 hour     Equipment Recommendations  Other (comment)(defer)    Recommendations for Other Services       Precautions / Restrictions Precautions Precautions: Sternal;Fall Precaution Booklet Issued: No Precaution Comments: Reviewed sternal precautions; expressive aphasia. Able to write a little to communicate Restrictions Weight Bearing Restrictions: Yes Other Position/Activity Restrictions: sternal precautions    Mobility  Bed Mobility Overal bed mobility: Needs Assistance Bed Mobility: Supine to Sit     Supine to sit: Mod assist;HOB elevated     General bed mobility comments: Up in chair upon PT arrival.  Transfers Overall transfer level: Needs assistance Equipment used: None Transfers: Sit to/from Stand Sit to Stand: Min guard         General transfer comment: Pt using momentum to stand from chair, Holding heart pillow during transfer.  Ambulation/Gait Ambulation/Gait assistance: Min assist Gait  Distance (Feet): 120 Feet Assistive device: None Gait Pattern/deviations: Step-through pattern;Decreased step length - right;Decreased step length - left;Drifts right/left Gait velocity: reduced Gait velocity interpretation: 1.31 - 2.62 ft/sec, indicative of limited community ambulator General Gait Details: Slow, guarded gait with decreased arm swing; +walk and talk test. Difficulty dual tasking wtih walking, min A needed for balance challenges.   Stairs             Wheelchair Mobility    Modified Rankin (Stroke Patients Only) Modified Rankin (Stroke Patients Only) Pre-Morbid Rankin Score: No symptoms Modified Rankin: Moderately severe disability     Balance Overall balance assessment: Needs assistance Sitting-balance support: Feet supported;No upper extremity supported Sitting balance-Leahy Scale: Good Sitting balance - Comments: supervision   Standing balance support: During functional activity Standing balance-Leahy Scale: Fair Standing balance comment: using RW for support at sink             High level balance activites: Side stepping;Backward walking;Direction changes;Turns;Sudden stops;Head turns High Level Balance Comments: Tolerated balance activities above with mild deviations in gait needing MIn A at times; needed to stop and talk to process cognitive tasks; decreased gait speed. Standardized Balance Assessment Standardized Balance Assessment : Dynamic Gait Index   Dynamic Gait Index Level Surface: Mild Impairment Change in Gait Speed: Moderate Impairment Gait with Horizontal Head Turns: Mild Impairment Gait with Vertical Head Turns: Mild Impairment Gait and Pivot Turn: Normal Step Over Obstacle: Mild Impairment Step Around Obstacles: Normal Steps: Moderate Impairment Total Score: 16      Cognition Arousal/Alertness: Awake/alert Behavior During Therapy: WFL for tasks assessed/performed Overall Cognitive Status: Difficult to assess  General Comments: Pt with expressive difficulties. While in hallway,worked on dual tasking with mobility (speech with counting, days of week, months of year etc)      Exercises      General Comments General comments (skin integrity, edema, etc.): ambulatory in room and hallway, VSS on RA.      Pertinent Vitals/Pain Pain Assessment: No/denies pain Pain Score: 4  Pain Location: chest Pain Descriptors / Indicators: Discomfort;Sore Pain Intervention(s): Monitored during session    Home Living     Available Help at Discharge: Family;Available 24 hours/day(His Mom is available when wife works)                Prior Function            PT Goals (current goals can now be found in the care plan section) Acute Rehab PT Goals Patient Stated Goal: return to independence Progress towards PT goals: Progressing toward goals    Frequency    Min 4X/week      PT Plan Current plan remains appropriate    Co-evaluation              AM-PAC PT "6 Clicks" Mobility   Outcome Measure  Help needed turning from your back to your side while in a flat bed without using bedrails?: None Help needed moving from lying on your back to sitting on the side of a flat bed without using bedrails?: A Lot Help needed moving to and from a bed to a chair (including a wheelchair)?: None Help needed standing up from a chair using your arms (e.g., wheelchair or bedside chair)?: None Help needed to walk in hospital room?: A Little Help needed climbing 3-5 steps with a railing? : A Lot 6 Click Score: 19    End of Session Equipment Utilized During Treatment: Gait belt Activity Tolerance: Patient tolerated treatment well Patient left: in chair;with call bell/phone within reach;with family/visitor present Nurse Communication: Mobility status PT Visit Diagnosis: Other abnormalities of gait and mobility (R26.89);Unsteadiness on feet (R26.81);Muscle weakness  (generalized) (M62.81);Hemiplegia and hemiparesis Hemiplegia - Right/Left: Right Hemiplegia - dominant/non-dominant: Dominant Hemiplegia - caused by: Cerebral infarction     Time: 1420-1440 PT Time Calculation (min) (ACUTE ONLY): 20 min  Charges:  $Neuromuscular Re-education: 8-22 mins                     Marisa Severin, PT, DPT Acute Rehabilitation Services Pager [phone] Office [phone]       Marguarite Arbour A Sabra Heck 05/08/2019, 3:58 PM

## 2019-05-08 NOTE — Progress Notes (Signed)
7 Days Post-Op Procedure(s) (LRB): REPAIR OF ACUTE ASCENDING THORACIC AORTIC DISSECTION USING HEMOSHIELD GRAFT SIZE (N/A) Subjective: Awake and alert, attempts to communicate but continues to have expressive dysphasia.  Making good progress with mobility.   Objective: Vital signs in last 24 hours: Temp:  [98 F (36.7 C)-98.7 F (37.1 C)] 98.7 F (37.1 C) (03/09 0732) Pulse Rate:  [73-87] 87 (03/09 0300) Cardiac Rhythm: Normal sinus rhythm (03/09 0700) Resp:  [14-20] 20 (03/09 0732) BP: (110-126)/(63-78) 126/68 (03/09 0732) SpO2:  [96 %-100 %] 98 % (03/09 0300) Weight:  [107.2 kg] 107.2 kg (03/09 0300)     Intake/Output from previous day: 03/08 0701 - 03/09 0700 In: 1709.2 [P.O.:560; I.V.:10; NG/GT:1139.2] Out: 600 [Urine:600] Intake/Output this shift: No intake/output data recorded.  Physical Exam General appearance: alert, cooperative and no distress Neurologic: mild right facial droop noted.  Heart: regular rate and rhythm Lungs: Breath sounds are clear.  Abdomen: soft and non-tender Extremities: all warm and well perfused with palpable pulses Wound: the sternal incision is well approximated and dry.  Lab Results: Recent Labs    05/06/19 0322  WBC 10.5  HGB 11.1*  HCT 34.3*  PLT 331   BMET:  Recent Labs    05/06/19 0322 05/08/19 0209  NA 143 141  K 3.9 4.4  CL 109 109  CO2 26 23  GLUCOSE 95 120*  BUN 23* 16  CREATININE 0.64 0.65  CALCIUM 8.7* 9.0    PT/INR: No results for input(s): LABPROT, INR in the last 72 hours. ABG    Component Value Date/Time   PHART 7.441 05/02/2019 0706   HCO3 23.1 05/02/2019 0706   TCO2 24 05/02/2019 0706   ACIDBASEDEF 1.0 05/02/2019 0409   O2SAT 96.0 05/02/2019 0706   CBG (last 3)  Recent Labs    05/05/19 1152 05/05/19 1605  GLUCAP 127* 98    Assessment/Plan: S/P Procedure(s) (LRB): REPAIR OF ACUTE ASCENDING THORACIC AORTIC DISSECTION USING HEMOSHIELD GRAFT SIZE (N/A)  -POD7 repair of Type A  aortic dissection in a 54yo male presenting with an acute CVA. PO intake is fair but says his appetite is poor due to the TF. Would like to discontinue TF and Cortrak if speech therapy agrees.  He is progressing with mobility and po intake. Ready for transfer to inpatient rehab from surgical standpoint. Awaiting insurance approval.  -Post-op VT on POD-2- appreciate evaluation by cardiology: "No evidence of ACS or ongoing ischemia".  He has had no further arhythmias. Continuing metoprolol. K+ 4.4,  Mg++2.0  -Expected acute blood loss anemia- Hct trending up   LOS: 7 days    Leary Roca, PA-C [phone] 05/08/2019

## 2019-05-08 NOTE — H&P (Signed)
Physical Medicine and Rehabilitation Admission H&P    Chief Complaint  Patient presents with  . Facial Droop    right  : HPI: Erik Parker is a 54 year old right-handed male history of hypertension, hyperlipidemia as well as tobacco abuse.  History taken from chart review due to expressive aphasia.  Patient lives with spouse.  Independent prior to admission working full-time.  1 level home 5 steps to entry.  Wife works 4 hours a day.  He presented on 05/01/2019 with right facial droop, chest pain, and right arm weakness. Troponin negative, BNP within normal limits 31, WBC 15,600, chemistries unremarkable.  CT of the head showed left frontal operculum acute nonhemorrhagic infarction.  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  No superimposed intracranial emergent large vessel occlusion.  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 Vickey Sages.  Intraoperative echocardiogram with ejection fraction of 50%. Follow-up MRI of the brain showed scattered areas of acute infarction in both hemispheres, more extensive on the left than right, consistent with embolic infarctions.  The largest area of involvement 3.5-4 cm stroke in the left frontal operculum with mild swelling and minimal petechial blood products.  Scattered other strokes in the left hemisphere, next largest measuring 2 cm at the left parieto-occipital junction.  Single acute infarction in the right hemisphere measuring less than a centimeter in size in the right frontal lobe.  Maintained on aspirin 325 mg daily for CVA prophylaxis.  Acute postoperative pericarditis maintained on colchicine.  Follow-up cardiology services for episode of V. tach 3/4 of 23 beats stat echocardiogram ejection fraction 45 to 50% no regional wall motion abnormalities to suggest ACS.  Patient did remain on metoprolol 25 mg twice daily.  Hospital course further  complicated by post stroke dysphagia.  Dysphagia #1 nectar thick liquid diet as well as a nasogastric tube remained in place and awaiting plan for removal of tube.  Therapy evaluations completed and patient was admitted for a comprehensive rehab program.  Please see preadmission assessment from earlier today as well.  Review of Systems  Constitutional: Positive for malaise/fatigue. Negative for chills and fever.  HENT: Negative for hearing loss.   Eyes: Negative for blurred vision and double vision.  Respiratory: Positive for shortness of breath. Negative for cough.   Cardiovascular: Negative.   Gastrointestinal: Positive for constipation. Negative for heartburn, nausea and vomiting.  Genitourinary: Negative for dysuria, flank pain and hematuria.  Skin: Negative for rash.  Neurological: Positive for speech change and weakness. Negative for focal weakness and seizures.   Past Medical History:  Diagnosis Date  . Hyperlipidemia   . Hypertension    Past Surgical History:  Procedure Laterality Date  . ACHILLES TENDON SURGERY Right 11/10/2016   Procedure: ACHILLES TENDON REPAIR VIA SPEED BRIDGE;  Surgeon: [name], DPM;  Location: [facility];  Service: Podiatry;  Laterality: Right;  . HEEL SPUR RESECTION Right 11/10/2016   Procedure: HEEL SPUR RESECTION (HAGLUNDS DEFORMITY);  Surgeon: [name], DPM;  Location: [facility];  Service: Podiatry;  Laterality: Right;  . REPAIR OF ACUTE ASCENDING THORACIC AORTIC DISSECTION N/A 05/01/2019   Procedure: REPAIR OF ACUTE ASCENDING THORACIC AORTIC DISSECTION USING HEMOSHIELD GRAFT SIZE ;  Surgeon: Linden Dolin, MD;  Location: [facility] OR;  Service: Cardiothoracic;  Laterality: N/A;   Family History  Problem Relation Age of Onset  . Hypertension Mother   . Hypertension Father    Social  History:  reports that he has been smoking cigarettes. He has a 4.50 pack-year smoking history. He has never used smokeless tobacco. He reports current  alcohol use. He reports that he does not use drugs. Allergies: No Known Allergies Medications Prior to Admission  Medication Sig Dispense Refill  . acetaminophen (TYLENOL) 160 MG/5ML solution Place 20.3 mLs (650 mg total) into feeding tube every 6 (six) hours as needed. 120 mL 0  . Amino Acids-Protein Hydrolys (FEEDING SUPPLEMENT, PRO-STAT SUGAR FREE 64,) LIQD Place 30 mLs into feeding tube 2 (two) times daily. 887 mL 0  . aspirin EC 325 MG EC tablet Take 1 tablet (325 mg total) by mouth daily. 30 tablet 0  . colchicine 0.6 MG tablet Take 1 tablet (0.6 mg total) by mouth 2 (two) times daily.    Marland Kitchen docusate sodium (COLACE) 100 MG capsule Take 2 capsules (200 mg total) by mouth daily. 10 capsule 0  . influenza vac split quadrivalent PF (FLUARIX) 0.5 ML injection Inject 0.5 mLs into the muscle once for 1 dose. 0.5 mL 0  . Maltodextrin-Xanthan Gum (RESOURCE THICKENUP CLEAR) POWD Take 125 g by mouth as needed.    . metoprolol tartrate (LOPRESSOR) 25 MG tablet Take 1 tablet (25 mg total) by mouth 2 (two) times daily.    . Nutritional Supplements (FEEDING SUPPLEMENT, OSMOLITE 1.5 CAL,) LIQD Place 1,000 mLs into feeding tube daily.  0  . pantoprazole (PROTONIX) 40 MG tablet Take 1 tablet (40 mg total) by mouth daily.    . pneumococcal 23 valent vaccine (PNEUMOVAX-23) 25 MCG/0.5ML injection Inject 0.5 mLs into the muscle once for 1 dose. 2.5 mL 0  . rosuvastatin (CRESTOR) 20 MG tablet Take 1 tablet (20 mg total) by mouth daily at 6 PM.    . traMADol (ULTRAM) 50 MG tablet Take 1 tablet (50 mg total) by mouth every 4 (four) hours as needed for moderate pain. 30 tablet     Drug Regimen Review Drug regimen was reviewed and remains appropriate with no significant issues identified  Home: Home Living Family/patient expects to be discharged to:: Private residence Living Arrangements: Spouse/significant other Available Help at Discharge: Family, Available 24 hours/day(His Mom is available when wife  works) Type of Home: House Home Access: Stairs to enter Secretary/administrator of Steps: 5 Entrance Stairs-Rails: Can reach both Home Layout: One level Bathroom Shower/Tub: Health visitor: Pharmacist, community: Yes Home Equipment: None  Lives With: Spouse, Son   Functional History: Prior Function Level of Independence: Independent Comments: able to nod yes that he was working, but unable to state. Was fully independent prior  Functional Status:  Mobility: Bed Mobility Overal bed mobility: Needs Assistance Bed Mobility: Supine to Sit Supine to sit: Mod assist, HOB elevated Sit to supine: Mod assist General bed mobility comments: Up in chair upon PT arrival. Transfers Overall transfer level: Needs assistance Equipment used: None Transfers: Sit to/from Stand Sit to Stand: Min guard General transfer comment: Pt using momentum to stand from chair, Holding heart pillow during transfer. Ambulation/Gait Ambulation/Gait assistance: Min assist Gait Distance (Feet): 120 Feet Assistive device: None Gait Pattern/deviations: Step-through pattern, Decreased step length - right, Decreased step length - left, Drifts right/left General Gait Details: Slow, guarded gait with decreased arm swing; +walk and talk test. Difficulty dual tasking wtih walking, min A needed for balance challenges. Gait velocity: reduced Gait velocity interpretation: 1.31 - 2.62 ft/sec, indicative of limited community ambulator    ADL: ADL Overall ADL's : Needs assistance/impaired Eating/Feeding: Supervision/  safety Grooming: Supervision/safety, Min guard, Standing Grooming Details (indicate cue type and reason): standing at sink x7 mins for light ADL Upper Body Bathing: Moderate assistance, Sitting, Cueing for UE precautions Lower Body Bathing: Moderate assistance, Maximal assistance, Sit to/from stand, Sitting/lateral leans Upper Body Dressing : Moderate assistance, Sitting, Cueing  for UE precautions Lower Body Dressing: Moderate assistance Lower Body Dressing Details (indicate cue type and reason): unable to fully perform LB dressing Toilet Transfer: Minimal assistance, Cueing for safety, Ambulation, RW Toilet Transfer Details (indicate cue type and reason): cues for sternal precaution with transfer Toileting- Clothing Manipulation and Hygiene: Minimal assistance, Sit to/from stand Tub/ Shower Transfer: Minimal assistance, Shower seat, Rolling walker, Moderate assistance Functional mobility during ADLs: Min guard, Minimal assistance, Cueing for safety General ADL Comments: Pt's O2 >97%on RA and HR <99 BPM with exertion. Pt performing ADL functional mobility with no AD and safety cues to keep from swaying side to s  Cognition: Cognition Overall Cognitive Status: Difficult to assess Orientation Level: Oriented X4 Cognition Arousal/Alertness: Awake/alert Behavior During Therapy: WFL for tasks assessed/performed Overall Cognitive Status: Difficult to assess General Comments: Pt with expressive difficulties. While in hallway,worked on dual tasking with mobility (speech with counting, days of week, months of year etc) Difficult to assess due to: Impaired communication  Physical Exam: Blood pressure 125/79, pulse 88, temperature 98.7 F (37.1 C), temperature source Oral, resp. rate 17, height 6' (1.829 m), weight 105.9 kg, SpO2 97 %. Physical Exam  Vitals reviewed. Constitutional: He appears well-developed.  Obese.  HENT:  Head: Normocephalic and atraumatic.  + Cortrak  Eyes: EOM are normal. Right eye exhibits no discharge. Left eye exhibits no discharge.  Neck: No tracheal deviation present. No thyromegaly present.  Respiratory: Effort normal. No respiratory distress.  + Emmett  GI: Soft. He exhibits no distension.  Musculoskeletal:     Comments: No edema or tenderness in extremities  Neurological: He is alert.  Patient is alert in no acute distress.   Makes eye  contact with examiner.   Motor: 4+/5 throughout Expressive aphasia Dysarthria  Skin: Skin is warm and dry.  Chest incision C/D/  Psychiatric: He has a normal mood and affect. His behavior is normal.    Results for orders placed or performed during the hospital encounter of 05/01/19 (from the past 48 hour(s))  Basic metabolic panel     Status: Abnormal   Collection Time: 05/08/19  2:09 AM  Result Value Ref Range   Sodium 141 135 - 145 mmol/L   Potassium 4.4 3.5 - 5.1 mmol/L   Chloride 109 98 - 111 mmol/L   CO2 23 22 - 32 mmol/L   Glucose, Bld 120 (H) 70 - 99 mg/dL    Comment: Glucose reference range applies only to samples taken after fasting for at least 8 hours.   BUN 16 6 - 20 mg/dL   Creatinine, Ser 0.65 0.61 - 1.24 mg/dL   Calcium 9.0 8.9 - 10.3 mg/dL   GFR calc non Af Amer >60 >60 mL/min   GFR calc Af Amer >60 >60 mL/min   Anion gap 9 5 - 15    Comment: Performed at [facility] [address] [place], [place] [address]  Magnesium     Status: None   Collection Time: 05/08/19  2:09 AM  Result Value Ref Range   Magnesium 2.0 1.7 - 2.4 mg/dL    Comment: Performed at [facility] [address] [place],  [address]   No  results found.     Medical Problem List and Plan: 1.  Right side weakness with facial droop aphasia/dysphagia secondary to left opercular infarction, thromboembolic in the setting of aortic dissection.S/P repair of ascending thoracic aortic dissection 05/01/2019.Sternal precautions  -patient may not shower  -ELOS/Goals: 7-12 days/Supervision/mod I with PT/OT and supervision/min a with SLP.  Admit to CIR 2.  Antithrombotics: -DVT/anticoagulation: SCDs.  -antiplatelet therapy: Aspirin 325 mg daily 3. Pain Management: Tramadol as needed 4. Mood: Provide emotional support  -antipsychotic agents: N/A 5. Neuropsych: This patient is capable of making decisions on his own behalf. 6. Skin/Wound Care: Routine skin checks 7.  Fluids/Electrolytes/Nutrition: Routine in and outs.  CMP ordered. 8.  Post stroke dysphagia.  Dysphagia #1 nectar thick liquids.  Follow-up speech therapy 9.  Hypertension/V. tach.  Lopressor 25 mg twice daily.  Follow-up cardiology services  Monitor with increased mobility 10.  Postoperative pericarditis.  Continue colchicine 11.  Tobacco abuse.  Counsel 12.  Hyperlipidemia.  Crestor  Lavon Paganini Angiulli, PA-C 05/09/2019  I have personally performed a face to face diagnostic evaluation, including, but not limited to relevant history and physical exam findings, of this patient and developed relevant assessment and plan.  Additionally, I have reviewed and concur with the physician assistant's documentation above.  Delice Lesch, MD, ABPMR

## 2019-05-08 NOTE — Progress Notes (Addendum)
Advanced Heart Failure Rounding Note   Subjective:     Remains aphasic w/ residual LLE numbness.    No further VT on tele. Awaiting CIR bed. No complaints today.    Objective:   Weight Range:  Vital Signs:   Temp:  [98 F (36.7 C)-98.7 F (37.1 C)] 98.7 F (37.1 C) (03/09 0732) Pulse Rate:  [73-87] 87 (03/09 0300) Resp:  [14-20] 20 (03/09 0732) BP: (110-126)/(63-78) 126/68 (03/09 0732) SpO2:  [96 %-100 %] 98 % (03/09 0300) Weight:  [107.2 kg] 107.2 kg (03/09 0300) Last BM Date: 05/07/19  Weight change: Filed Weights   05/06/19 1230 05/07/19 0435 05/08/19 0300  Weight: 107.9 kg 108.3 kg 107.2 kg    Intake/Output:   Intake/Output Summary (Last 24 hours) at 05/08/2019 0958 Last data filed at 05/08/2019 4431 Gross per 24 hour  Intake 1589.17 ml  Output 600 ml  Net 989.17 ml     Physical Exam: General: Well nourished male. No resp difficulty HEENT: normal mild R facial droop, + NGT Neck: supple. no JVD. Carotids 2+ bilat; no bruits. No lymphadenopathy or thryomegaly appreciated. Cor: Sternal wound ok. Regular rate & rhythm. No rubs, gallops or murmurs.  PMI nondisplaced Lungs: clear Abdomen: soft, nondistended. No hepatosplenomegaly. No bruits or masses. Good bowel sounds. Extremities: no cyanosis, clubbing, rash, edema Neuro: alert follows commands. Mildly weak on R and apahsic   Telemetry: NSR 90s, no recurrent VT Personally reviewed   Labs: Basic Metabolic Panel: Recent Labs  Lab 05/03/19 1800 05/03/19 1800 05/04/19 0621 05/04/19 0621 05/04/19 1234 05/04/19 1711 05/05/19 0343 05/05/19 2123 05/06/19 0322 05/08/19 0209  NA 137  --  140  --   --   --  143  --  143 141  K 3.7  --  3.7  --   --   --  3.8  --  3.9 4.4  CL 105  --  107  --   --   --  108  --  109 109  CO2 22  --  23  --   --   --  26  --  26 23  GLUCOSE 132*  --  109*  --   --   --  118*  --  95 120*  BUN 9  --  15  --   --   --  20  --  23* 16  CREATININE 0.73  --  0.69  --   --    --  0.66  --  0.64 0.65  CALCIUM 8.6*   < > 8.7*   < >  --   --  8.7*  --  8.7* 9.0  MG  --   --  2.4   < > 2.2 2.4 2.3 2.1  --  2.0  PHOS  --   --   --   --  3.1 4.1 3.4 3.9  --   --    < > = values in this interval not displayed.    Liver Function Tests: No results for input(s): AST, ALT, ALKPHOS, BILITOT, PROT, ALBUMIN in the last 168 hours. No results for input(s): LIPASE, AMYLASE in the last 168 hours. No results for input(s): AMMONIA in the last 168 hours.  CBC: Recent Labs  Lab 05/02/19 1624 05/03/19 0435 05/04/19 0621 05/05/19 0343 05/06/19 0322  WBC 18.9* 16.3* 12.5* 10.8* 10.5  HGB 11.1* 11.1* 10.9* 10.9* 11.1*  HCT 34.4* 33.6* 33.1* 33.7* 34.3*  MCV 87.5 87.5 87.1 88.9 88.2  PLT 221 197 224 272 331    Cardiac Enzymes: No results for input(s): CKTOTAL, CKMB, CKMBINDEX, TROPONINI in the last 168 hours.  BNP: BNP (last 3 results) Recent Labs    04/30/19 1257  BNP 31.0    ProBNP (last 3 results) No results for input(s): PROBNP in the last 8760 hours.    Other results:  Imaging: No results found.   Medications:     Scheduled Medications: . acetaminophen (TYLENOL) oral liquid 160 mg/5 mL  650 mg Per Tube Once   Or  . acetaminophen  650 mg Rectal Once  . aspirin EC  325 mg Oral Daily   Or  . aspirin  324 mg Per Tube Daily  . bisacodyl  10 mg Oral Daily   Or  . bisacodyl  10 mg Rectal Daily  . Chlorhexidine Gluconate Cloth  6 each Topical Daily  . colchicine  0.6 mg Oral BID  . docusate sodium  200 mg Oral Daily  . feeding supplement (OSMOLITE 1.5 CAL)  1,000 mL Per Tube Q24H  . feeding supplement (PRO-STAT SUGAR FREE 64)  30 mL Per Tube BID  . folic acid  1 mg Oral Daily  . mouth rinse  15 mL Mouth Rinse BID  . metoprolol tartrate  25 mg Oral BID  . pantoprazole (PROTONIX) IV  40 mg Intravenous Q24H  . rosuvastatin  20 mg Oral q1800  . sodium chloride flush  3 mL Intravenous Q12H    Infusions: . sodium chloride    . sodium chloride  Stopped (05/04/19 0539)    PRN Medications: sodium chloride, influenza vac split quadrivalent PF, ondansetron (ZOFRAN) IV, oxyCODONE, pneumococcal 23 valent vaccine, Resource ThickenUp Clear, sodium chloride flush, traMADol   Assessment/Plan:   1. Monomorphic VT on 3/4 (23 beats) - obviously the main question here is if this is ischemic-mediated or not as coronaries were not able to be evaluated in setting of acute dissection. On pre-op studies and in OR no evidence of RCA involvement - stat echo on 3/4 EF 45-50%  no regional wall motion abnormalities to suggest ACS - hstrop moderately elevated but flat in setting of heart surgery, Not c/w ACS - VT quiescent.  - Keep K> 4.0, Mg > 2.0 - continue metoprolol 25 mg bid  - No evidence of ACS or ongoing ischemia. No need for ischemic w/u at this point.  2. 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 - stable by echo  - no change.  - has diuresed well. Weight below baseline. No diuretic requirement currenlty  3. Acute post-op pericarditis - continue with colchicine  4. CVA, acute with severe aphasia - Neuro following - improving slowly. Still weak on Rt side - Neuro and CIR following  5. HTN - BP ok.   6. Ab pain - pain resolved - continue reglan. Abd [place] 3/7 unremarkable   Length of Stay: 7   Stable for transfer to CIR from our standpoint.   Meds for Discharge ASA 325 mg Metoprolol 25 mg bid Crestor 20 mg qhs Colchcine 0.6 mg bid     Brittainy Simmons PA-C 05/08/2019, 9:58 AM  Advanced Heart Failure Team Pager [phone] (M-F; 7a - 4p)  Please contact [facility] for night-coverage after hours (4p -7a ) and weekends on [email or web address]  Patient seen and examined with the above-signed Advanced Practice Provider and/or Housestaff. I personally reviewed laboratory data, imaging studies and relevant notes. I independently examined the patient and formulated the important aspects  of the plan. I  have edited the note to reflect any of my changes or salient points. I have personally discussed the plan with the patient and/or family.  Doing well. No further VT on monitor. BP looks good. Volume status ok.   Can go to CIR today.  We will sign off. Please call with questions.   Arrange outpatient f/u with general cardiology.   Arvilla Meres, MD  10:22 AM

## 2019-05-08 NOTE — Progress Notes (Signed)
Nutrition Follow up  DOCUMENTATION CODES:   Not applicable  INTERVENTION:   Hold TF to see if intake progresses. Advise against removal of Cortrak until PO intake meets >75% of needs consistently.    Continue Magic cup BID with meals, each supplement provides 290 kcal and 9 grams of protein  Add Hormel Shake TID between meals, each supplement provides 520 kcals and 22 grams of protein  MVI daily  NUTRITION DIAGNOSIS:   Inadequate oral intake related to dysphagia, inability to eat, acute illness as evidenced by NPO status.  Ongoing  GOAL:   Patient will meet greater than or equal to 90% of their needs  Progressing  MONITOR:   TF tolerance, PO intake, Supplement acceptance, Labs, Weight trends  REASON FOR ASSESSMENT:   Rounds    ASSESSMENT:   54 yo male admitted with acute CVA, acute ascending aortic disection. PMH includes HLD, HTN, current smoker  RD working remotely.  3/02 Admit, Repair of aortic dissection 3/04 Diet advanced to Dysphagia I, Nectar  3/05 Cortrak placed  Diet advanced back to DYS 1 nectar thick liquids on 3/4. Meal completions charted as 10-25% since advancement. Pt seems to think TF is the cause of poor intake. RN questioning if Cortrak can be removed. Recommend holding TF but leaving Cortrak in until PO intake progresses. RD to order additional supplement to maximize PO protein and kcal.   Admission weight: 117.1 kg  Current weight: 107.2 kg   I/O: +1,790 ml since admit  UOP: 600 ml x 24 hrs   Medications: dulcolax, colace, folic acid Labs: CBG 98-136  Diet Order:   Diet Order            DIET - DYS 1 Room service appropriate? Yes with Assist; Fluid consistency: Nectar Thick  Diet effective now              EDUCATION NEEDS:   Not appropriate for education at this time  Skin:  Skin Assessment: Skin Integrity Issues: Skin Integrity Issues:: Incisions Incisions: chest  Last BM:  3/8  Height:   Ht Readings from Last 1  Encounters:  05/06/19 6' (1.829 m)    Weight:   Wt Readings from Last 1 Encounters:  05/08/19 107.2 kg    BMI:  Body mass index is 32.05 kg/m.  Estimated Nutritional Needs:   Kcal:  2000-2200 kcals  Protein:  100-120 g  Fluid:  >/= 2 L   Vanessa Kick RD, LDN Clinical Nutrition Pager listed in AMION

## 2019-05-08 NOTE — PMR Pre-admission (Addendum)
PMR Admission Coordinator Pre-Admission Assessment  Patient: Erik Parker is an 54 y.o., male MRN: [id number] DOB: 1965-06-30 Height: 6' (182.9 cm) Weight: 105.9 kg              Insurance Information HMO:     PPO: yes     PCP:      IPA:      80/20:      OTHER:  PRIMARY: [employer]      Policy#: [id number]      Subscriber: pt CM Name: Park Meo      Phone#: [phone]     Fax#: [phone] Pre-Cert#: [id number]   Approved until 3/16 with updates due 3/17   Employer:  Benefits:  Phone #: [phone]     Name: 3/8 Eff. Date: 03/01/2017     Deduct: $2800      Out of Pocket Max: $4500 includes deductible      Life Max: none CIR: 80%      SNF: 80% 40 days Outpatient: 80%     Co-Pay: 60 visits combined Home Health: 80%      Co-Pay: 40 visits per year DME: 80%     Co-Pay: 20% Providers: in network  SECONDARY: none        Medicaid Application Date:       Case Manager:  Disability Application Date:       Case Worker:   The "Data Collection Information Summary" for patients in Inpatient Rehabilitation Facilities with attached "Privacy Act Statement-Health Care Records" was provided and verbally reviewed with: N/A  Emergency Contact Information Contact Information     Name Relation Home Work Mobile   Ross,[name] Significant other [phone]  [phone]      Current Medical History  Patient Admitting Diagnosis: aortic dissection s/p repair with CVA  History of Present Illness:  54 year old right-handed male history of hypertension, hyperlipidemia as well as tobacco abuse.  Patient presented 05/01/2019 to [facility] with chest pain, right facial droop and inability to speak with right arm weakness.   Troponin negative, BNP within normal limits 31, WBC 15,600, chemistries unremarkable.  CT of the head showed left frontal operculum acute nonhemorrhagic infarction.  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  No  superimposed intracranial emergent large vessel occlusion.  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 Vickey Sages.  Intraoperative echocardiogram with ejection fraction of 50%.  Follow-up MRI of the brain showed scattered areas of acute infarction in both hemispheres, more extensive on the left than right, consistent with embolic infarctions.  The largest area of involvement 3.5-4 cm stroke in the left frontal operculum with mild swelling and minimal petechial blood products.  Scattered other strokes in the left hemisphere, next largest measuring 2 cm at the left parieto-occipital junction.  Single acute infarction in the right hemisphere measuring less than a centimeter in size in the right frontal lobe.  Maintained on aspirin 325 mg daily for CVA prophylaxis.  Acute postoperative pericarditis maintained on colchicine.  Follow-up cardiology services for episode of V. tach 3/4 of 23 beats stat echocardiogram ejection fraction 45 to 50% no regional wall motion abnormalities to suggest ACS.  Patient did remain on metoprolol 25 mg twice daily.  Dysphagia #1 nectar thick liquid diet as well as a nasogastric tube remained in place and awaiting plan for removal of tube.    Complete NIHSS TOTAL: 5 Glasgow Coma Scale Score: 15  Past Medical History  Past Medical History:  Diagnosis Date   Hyperlipidemia    Hypertension     Family History  family history includes Hypertension in his father and mother.  Prior Rehab/Hospitalizations:  Has the patient had prior rehab or hospitalizations prior to admission? Yes  Has the patient had major surgery during 100 days prior to admission? Yes  Current Medications   Current Facility-Administered Medications:    0.9 %  sodium chloride infusion, 250 mL, Intravenous, Continuous, Barrett, Erin R, PA-C   0.9 %  sodium chloride infusion, , Intravenous, PRN, Linden DolinAtkins, Broadus Z, MD, Stopped at 05/04/19 0539   acetaminophen  (TYLENOL) 160 MG/5ML solution 650 mg, 650 mg, Per Tube, Once **OR** acetaminophen (TYLENOL) suppository 650 mg, 650 mg, Rectal, Once, Barrett, Erin R, PA-C   aspirin EC tablet 325 mg, 325 mg, Oral, Daily, 325 mg at 05/09/19 0951 **OR** aspirin chewable tablet 324 mg, 324 mg, Per Tube, Daily, Barrett, Erin R, PA-C, 324 mg at 05/08/19 0912   bisacodyl (DULCOLAX) EC tablet 10 mg, 10 mg, Oral, Daily, 10 mg at 05/09/19 0951 **OR** bisacodyl (DULCOLAX) suppository 10 mg, 10 mg, Rectal, Daily, Barrett, Erin R, PA-C   Chlorhexidine Gluconate Cloth 2 % PADS 6 each, 6 each, Topical, Daily, Linden DolinAtkins, Broadus Z, MD, 6 each at 05/08/19 0913   colchicine tablet 0.6 mg, 0.6 mg, Oral, BID, Vickey SagesAtkins, Merri BrunetteBroadus Z, MD, 0.6 mg at 05/09/19 0951   docusate sodium (COLACE) capsule 200 mg, 200 mg, Oral, Daily, Barrett, Erin R, PA-C, 200 mg at 05/09/19 0950   feeding supplement (OSMOLITE 1.5 CAL) liquid 1,000 mL, 1,000 mL, Per Tube, Q24H, Atkins, Merri BrunetteBroadus Z, MD, Stopped at 05/08/19 0625   feeding supplement (PRO-STAT SUGAR FREE 64) liquid 30 mL, 30 mL, Per Tube, BID, Atkins, Merri BrunetteBroadus Z, MD, 30 mL at 05/09/19 0954   folic acid (FOLVITE) tablet 1 mg, 1 mg, Oral, Daily, Barrett, Erin R, PA-C, 1 mg at 05/08/19 0912   influenza vac split quadrivalent PF (FLUARIX) injection 0.5 mL, 0.5 mL, Intramuscular, Prior to discharge, Atkins, Merri BrunetteBroadus Z, MD   MEDLINE mouth rinse, 15 mL, Mouth Rinse, BID, Atkins, Merri BrunetteBroadus Z, MD, 15 mL at 05/08/19 2149   metoprolol tartrate (LOPRESSOR) tablet 25 mg, 25 mg, Oral, BID, Donata ClayVan Trigt, Theron AristaPeter, MD, 25 mg at 05/09/19 0952   ondansetron (ZOFRAN) injection 4 mg, 4 mg, Intravenous, Q6H PRN, Barrett, Erin R, PA-C, 4 mg at 05/06/19 0814   oxyCODONE (Oxy IR/ROXICODONE) immediate release tablet 5-10 mg, 5-10 mg, Oral, Q3H PRN, Barrett, Erin R, PA-C, 10 mg at 05/09/19 0949   pantoprazole (PROTONIX) injection 40 mg, 40 mg, Intravenous, Q24H, Atkins, Merri BrunetteBroadus Z, MD, 40 mg at 05/09/19 0950   pneumococcal 23 valent vaccine  (PNEUMOVAX-23) injection 0.5 mL, 0.5 mL, Intramuscular, Prior to discharge, Atkins, Merri BrunetteBroadus Z, MD   Resource ThickenUp Clear, , Oral, PRN, Mosetta AnisBitonti, Michael T, RPH   rosuvastatin (CRESTOR) tablet 20 mg, 20 mg, Oral, q1800, Linden DolinAtkins, Broadus Z, MD, 20 mg at 05/08/19 1630   sodium chloride flush (NS) 0.9 % injection 3 mL, 3 mL, Intravenous, Q12H, Barrett, Erin R, PA-C, 3 mL at 05/08/19 2149   sodium chloride flush (NS) 0.9 % injection 3 mL, 3 mL, Intravenous, PRN, Barrett, Erin R, PA-C   traMADol (ULTRAM) tablet 50-100 mg, 50-100 mg, Oral, Q4H PRN, Barrett, Erin R, PA-C, 100 mg at 05/06/19 [id number]  Patients Current Diet:  Diet Order             DIET - DYS 1 Room service appropriate?  Yes with Assist; Fluid consistency: Nectar Thick  Diet effective now                Precautions / Restrictions Precautions Precautions: Sternal, Fall Precaution Booklet Issued: No Precaution Comments: Reviewed sternal precautions; expressive aphasia. Able to write a little to communicate Restrictions Weight Bearing Restrictions: Yes(Sternal precautions) Other Position/Activity Restrictions: sternal precautions   Has the patient had 2 or more falls or a fall with injury in the past year?No  Prior Activity Level Community (5-7x/wk): working; driving, independent  Prior Functional Level Prior Function Level of Independence: Independent Comments: able to nod yes that he was working, but unable to state. Was fully independent prior  Self Care: Did the patient need help bathing, dressing, using the toilet or eating?  Independent  Indoor Mobility: Did the patient need assistance with walking from room to room (with or without device)? Independent  Stairs: Did the patient need assistance with internal or external stairs (with or without device)? Independent  Functional Cognition: Did the patient need help planning regular tasks such as shopping or remembering to take medications? Independent  Home  Assistive Devices / Equipment Home Assistive Devices/Equipment: None Home Equipment: None  Prior Device Use: Indicate devices/aids used by the patient prior to current illness, exacerbation or injury? None of the above  Current Functional Level Cognition  Overall Cognitive Status: Difficult to assess Difficult to assess due to: Impaired communication Orientation Level: Oriented X4 General Comments: Pt with expressive difficulties. While in hallway,worked on dual tasking with mobility (speech with counting, days of week, months of year etc)    Extremity Assessment (includes Sensation/Coordination)  Upper Extremity Assessment: Generalized weakness LUE Deficits / Details: decreased coordination, but able to open comntainers with increased time  Lower Extremity Assessment: Generalized weakness    ADLs  Overall ADL's : Needs assistance/impaired Eating/Feeding: Supervision/ safety Grooming: Supervision/safety, Min guard, Standing Grooming Details (indicate cue type and reason): standing at sink x7 mins for light ADL Upper Body Bathing: Moderate assistance, Sitting, Cueing for UE precautions Lower Body Bathing: Moderate assistance, Maximal assistance, Sit to/from stand, Sitting/lateral leans Upper Body Dressing : Moderate assistance, Sitting, Cueing for UE precautions Lower Body Dressing: Moderate assistance Lower Body Dressing Details (indicate cue type and reason): unable to fully perform LB dressing Toilet Transfer: Minimal assistance, Cueing for safety, Ambulation, RW Toilet Transfer Details (indicate cue type and reason): cues for sternal precaution with transfer Toileting- Clothing Manipulation and Hygiene: Minimal assistance, Sit to/from stand Tub/ Shower Transfer: Minimal assistance, Shower seat, Rolling walker, Moderate assistance Functional mobility during ADLs: Min guard, Minimal assistance, Cueing for safety General ADL Comments: Pt's O2 >97%on RA and HR <99 BPM with exertion.  Pt performing ADL functional mobility with no AD and safety cues to keep from swaying side to s    Mobility  Overal bed mobility: Needs Assistance Bed Mobility: Supine to Sit Supine to sit: Mod assist, HOB elevated Sit to supine: Mod assist General bed mobility comments: Up in chair upon PT arrival.    Transfers  Overall transfer level: Needs assistance Equipment used: None Transfers: Sit to/from Stand Sit to Stand: Min guard General transfer comment: Pt using momentum to stand from chair, Holding heart pillow during transfer.    Ambulation / Gait / Stairs / Wheelchair Mobility  Ambulation/Gait Ambulation/Gait assistance: Editor, commissioning (Feet): 120 Feet Assistive device: None Gait Pattern/deviations: Step-through pattern, Decreased step length - right, Decreased step length - left, Drifts right/left General Gait Details: Slow, guarded gait with  decreased arm swing; +walk and talk test. Difficulty dual tasking wtih walking, min A needed for balance challenges. Gait velocity: reduced Gait velocity interpretation: 1.31 - 2.62 ft/sec, indicative of limited community ambulator    Posture / Balance Dynamic Sitting Balance Sitting balance - Comments: supervision Balance Overall balance assessment: Needs assistance Sitting-balance support: Feet supported, No upper extremity supported Sitting balance-Leahy Scale: Good Sitting balance - Comments: supervision Standing balance support: During functional activity Standing balance-Leahy Scale: Fair Standing balance comment: using RW for support at sink High level balance activites: Side stepping, Backward walking, Direction changes, Turns, Sudden stops, Head turns High Level Balance Comments: Tolerated balance activities above with mild deviations in gait needing MIn A at times; needed to stop and talk to process cognitive tasks; decreased gait speed. Standardized Balance Assessment Standardized Balance Assessment : Dynamic Gait  Index Dynamic Gait Index Level Surface: Mild Impairment Change in Gait Speed: Moderate Impairment Gait with Horizontal Head Turns: Mild Impairment Gait with Vertical Head Turns: Mild Impairment Gait and Pivot Turn: Normal Step Over Obstacle: Mild Impairment Step Around Obstacles: Normal Steps: Moderate Impairment Total Score: 16    Special needs/care consideration BiPAP/CPAP CPM Continuous Drip IV Dialysis         Life Vest Oxygen Special Bed Trach Size Wound Vac  Skin  surgical incision                          43 inches 10 FR left nare cortrak placed 3/5 Bowel mgmt: continent Bladder mgmt: continent Diabetic mgmt Behavioral consideration  Chemo/radiation  Designated visitor is English as a second language teacher   Previous Home Environment  Living Arrangements: Spouse/significant other  Lives With: Spouse, Son Available Help at Discharge: Family, Available 24 hours/day(His Mom is available when wife works) Type of Home: House Home Layout: One level Home Access: Stairs to enter Entrance Stairs-Rails: Can reach both Secretary/administrator of Steps: 5 Bathroom Shower/Tub: Health visitor: Standard Bathroom Accessibility: Yes How Accessible: Accessible via walker Home Care Services: No  Discharge Living Setting Plans for Discharge Living Setting: Patient's home, Lives with (comment)(wife and 73 year old child; other children not in home) Type of Home at Discharge: House Discharge Home Layout: One level Discharge Home Access: Stairs to enter Entrance Stairs-Rails: Right, Left, Can reach both Entrance Stairs-Number of Steps: 5 Discharge Bathroom Shower/Tub: Walk-in shower Discharge Bathroom Toilet: Standard Discharge Bathroom Accessibility: Yes How Accessible: Accessible via walker Does the patient have any problems obtaining your medications?: No  Social/Family/Support Systems Patient Roles: Spouse, Parent(employee) Contact Information: wife, English as a second language teacher Anticipated  Caregiver: wife and Mom Anticipated Industrial/product designer Information: see above Ability/Limitations of Caregiver: wife works Engineer, structural Availability: 24/7 Discharge Plan Discussed with Primary Caregiver: Yes Is Caregiver In Agreement with Plan?: Yes Does Caregiver/Family have Issues with Lodging/Transportation while Pt is in Rehab?: No  Goals/Additional Needs Patient/Family Goal for Rehab: Mod I to supervision PT, OT, and SLP Expected length of stay: [facility]OS 7-11 days  Decrease burden of Care through IP rehab admission: n/a  Possible need for SNF placement upon discharge: not anticipated  Patient Condition: This patient's medical and functional status has changed since the consult dated 05/04/2019 in which the Rehabilitation Physician determined and documented that the patient was potentially appropriate for intensive rehabilitative care in an inpatient rehabilitation facility. Issues have been addressed and update has been discussed with Dr. Allena Katz and patient now appropriate for inpatient rehabilitation. Will admit to inpatient rehab today.   Preadmission Screen Completed By:  Beckie Salts,  Audelia Acton, RN, 05/09/2019 10:23 AM ______________________________________________________________________   Discussed status with Dr. Posey Pronto on 05/09/2019 at  1025 and received approval for admission today.  Admission Coordinator:  Cleatrice Burke, time 5465 Date 05/09/2019

## 2019-05-08 NOTE — Plan of Care (Signed)
  Problem: Education: Goal: Knowledge of General Education information will improve Description: Including pain rating scale, medication(s)/side effects and non-pharmacologic comfort measures Outcome: Progressing   Problem: Health Behavior/Discharge Planning: Goal: Ability to manage health-related needs will improve Outcome: Progressing   Problem: Clinical Measurements: Goal: Will remain free from infection Outcome: Progressing Goal: Diagnostic test results will improve Outcome: Progressing   Problem: Activity: Goal: Risk for activity intolerance will decrease Outcome: Progressing   Problem: Coping: Goal: Level of anxiety will decrease Outcome: Progressing   

## 2019-05-08 NOTE — Progress Notes (Signed)
CARDIAC REHAB PHASE I    assisted to bathroom  MODE:  Ambulation: 150 ft   POST:  Rate/Rhythm: 150  BP:  Supine:   Sitting: 126/78  Standing:    SaO2: 99%RA 1300-1322 Assisted pt to bathroom and to wash hands. Then pt walked 150 ft with assistance. To recliner after walk. Discussed smoking cessation and pt agreed he is quit. Left heart healthy diet and staying in the tube handout. Encouraged sternal precautions with pt. Encouraged IS. Left call bell with pt.   Luetta Nutting, RN BSN  05/08/2019 1:17 PM

## 2019-05-08 NOTE — H&P (Deleted)
  The note originally documented on this encounter has been moved the the encounter in which it belongs.  

## 2019-05-08 NOTE — Progress Notes (Signed)
Occupational Therapy Treatment Patient Details Name: LAYKEN DOENGES MRN: [id number] DOB: [birth year] Today's Date: 05/08/2019    History of present illness 54 y/o h/o HTN and HL. Presented to [facility] on 3/2 with acute CVA. CTA head showed a left frontal operculum acute nonhemorrhagic infarct. Patient was noted to also have a dissection within the asending aorta on the lower cuts of the head/neck CT. Now s/p AAA.   OT comments  Pt minguardA for grooming at sink. Pt progressing to ambulation in hallway 150' with minguardA to minA to avoid swaying. Pt relaying words and pointing to objects. Pt with some intelligible speech today for name and colors to familiarize self with speech patterns. Pt O2 >98% on RA and HR<95 BPM. Pt would benefit from continued OT skilled services for higher level ADL, IADL and cognition. OT following acutely.   Follow Up Recommendations  CIR    Equipment Recommendations  Other (comment)(TBD)    Recommendations for Other Services      Precautions / Restrictions Precautions Precautions: Sternal;Fall Precaution Booklet Issued: No Precaution Comments: Reviewed sternal precautions; expressive aphasia. Able to write a little to communicate Restrictions Weight Bearing Restrictions: Yes Other Position/Activity Restrictions: sternal precautions       Mobility Bed Mobility Overal bed mobility: Needs Assistance Bed Mobility: Supine to Sit     Supine to sit: Mod assist;HOB elevated     General bed mobility comments: Using heart pillow  Transfers Overall transfer level: Needs assistance Equipment used: None Transfers: Sit to/from Stand                Balance Overall balance assessment: Needs assistance Sitting-balance support: Feet supported;No upper extremity supported Sitting balance-Leahy Scale: Good Sitting balance - Comments: supervision   Standing balance support: During functional activity Standing balance-Leahy Scale: Fair Standing balance  comment: using RW for support at sink                           ADL either performed or assessed with clinical judgement   ADL Overall ADL's : Needs assistance/impaired     Grooming: Supervision/safety;Min guard;Standing Grooming Details (indicate cue type and reason): standing at sink x7 mins for light ADL             Lower Body Dressing: Moderate assistance Lower Body Dressing Details (indicate cue type and reason): unable to fully perform LB dressing             Functional mobility during ADLs: Min guard;Minimal assistance;Cueing for safety General ADL Comments: Pt's O2 >97%on RA and HR <99 BPM with exertion. Pt performing ADL functional mobility with no AD and safety cues to keep from swaying side to s     Vision   Vision Assessment?: No apparent visual deficits   Perception     Praxis      Cognition Arousal/Alertness: Awake/alert Behavior During Therapy: WFL for tasks assessed/performed Overall Cognitive Status: Difficult to assess                                 General Comments: Pt with expressive difficulties. While in hallway, pt able to name objects with some intelligible, but pt trying to pronounce and able to say "hello" "toni" and "red" "blue"         Exercises     Shoulder Instructions       General Comments ambulatory in room and hallway, VSS  on RA.    Pertinent Vitals/ Pain       Pain Assessment: Faces Pain Score: 4  Pain Location: chest Pain Descriptors / Indicators: Discomfort;Sore Pain Intervention(s): Monitored during session  Home Living                                          Prior Functioning/Environment              Frequency  Min 2X/week        Progress Toward Goals  OT Goals(current goals can now be found in the care plan section)  Progress towards OT goals: Progressing toward goals  Acute Rehab OT Goals Patient Stated Goal: return to independence OT Goal  Formulation: With patient Time For Goal Achievement: 05/18/19 ADL Goals Pt Will Perform Grooming: with supervision;standing Pt Will Perform Lower Body Bathing: with min assist;sit to/from stand;sitting/lateral leans Pt Will Perform Lower Body Dressing: with min assist;sitting/lateral leans;sit to/from stand Pt Will Transfer to Toilet: with min guard assist;ambulating;regular height toilet;grab bars Additional ADL Goal #1: Pt will recall and apply sternal precautions to BADLs with min VC's  Plan Discharge plan remains appropriate    Co-evaluation                 AM-PAC OT "6 Clicks" Daily Activity     Outcome Measure   Help from another person eating meals?: A Little Help from another person taking care of personal grooming?: A Little Help from another person toileting, which includes using toliet, bedpan, or urinal?: A Little Help from another person bathing (including washing, rinsing, drying)?: A Lot Help from another person to put on and taking off regular upper body clothing?: A Little Help from another person to put on and taking off regular lower body clothing?: A Lot 6 Click Score: 16    End of Session Equipment Utilized During Treatment: Gait belt  OT Visit Diagnosis: Unsteadiness on feet (R26.81);Other abnormalities of gait and mobility (R26.89);Muscle weakness (generalized) (M62.81);Cognitive communication deficit (R41.841);Pain Symptoms and signs involving cognitive functions: Cerebral infarction   Activity Tolerance Patient tolerated treatment well   Patient Left in chair;with call bell/phone within reach   Nurse Communication Mobility status        Time: 1201-1222 OT Time Calculation (min): 21 min  Charges: OT General Charges $OT Visit: 1 Visit OT Treatments $Self Care/Home Management : 8-22 mins  Flora Lipps, OTR/L Acute Rehabilitation Services Pager: [phone] Office: [phone]    Kailei Cowens C 05/08/2019, 2:18 PM

## 2019-05-09 ENCOUNTER — Other Ambulatory Visit: Payer: Self-pay

## 2019-05-09 ENCOUNTER — Encounter (HOSPITAL_COMMUNITY): Payer: Self-pay | Admitting: Physical Medicine and Rehabilitation

## 2019-05-09 ENCOUNTER — Inpatient Hospital Stay (HOSPITAL_COMMUNITY)
Admission: RE | Admit: 2019-05-09 | Discharge: 2019-05-15 | DRG: 057 | Disposition: A | Discharge status: Home Health | Payer: 59 | Source: Intra-hospital | Attending: Physical Medicine and Rehabilitation | Admitting: Physical Medicine and Rehabilitation

## 2019-05-09 DIAGNOSIS — I319 Disease of pericardium, unspecified: Secondary | ICD-10-CM | POA: Diagnosis present

## 2019-05-09 DIAGNOSIS — Z79899 Other long term (current) drug therapy: Secondary | ICD-10-CM | POA: Diagnosis not present

## 2019-05-09 DIAGNOSIS — Z6831 Body mass index (BMI) 31.0-31.9, adult: Secondary | ICD-10-CM | POA: Diagnosis not present

## 2019-05-09 DIAGNOSIS — K5900 Constipation, unspecified: Secondary | ICD-10-CM | POA: Diagnosis present

## 2019-05-09 DIAGNOSIS — I69391 Dysphagia following cerebral infarction: Secondary | ICD-10-CM

## 2019-05-09 DIAGNOSIS — F17210 Nicotine dependence, cigarettes, uncomplicated: Secondary | ICD-10-CM | POA: Diagnosis present

## 2019-05-09 DIAGNOSIS — G4700 Insomnia, unspecified: Secondary | ICD-10-CM | POA: Diagnosis not present

## 2019-05-09 DIAGNOSIS — Z95828 Presence of other vascular implants and grafts: Secondary | ICD-10-CM

## 2019-05-09 DIAGNOSIS — Z951 Presence of aortocoronary bypass graft: Secondary | ICD-10-CM | POA: Diagnosis not present

## 2019-05-09 DIAGNOSIS — I6340 Cerebral infarction due to embolism of unspecified cerebral artery: Secondary | ICD-10-CM | POA: Diagnosis present

## 2019-05-09 DIAGNOSIS — E669 Obesity, unspecified: Secondary | ICD-10-CM | POA: Diagnosis present

## 2019-05-09 DIAGNOSIS — I69320 Aphasia following cerebral infarction: Secondary | ICD-10-CM | POA: Diagnosis not present

## 2019-05-09 DIAGNOSIS — R1310 Dysphagia, unspecified: Secondary | ICD-10-CM | POA: Diagnosis present

## 2019-05-09 DIAGNOSIS — E785 Hyperlipidemia, unspecified: Secondary | ICD-10-CM | POA: Diagnosis present

## 2019-05-09 DIAGNOSIS — I7101 Dissection of thoracic aorta: Secondary | ICD-10-CM | POA: Diagnosis not present

## 2019-05-09 DIAGNOSIS — I10 Essential (primary) hypertension: Secondary | ICD-10-CM | POA: Diagnosis present

## 2019-05-09 DIAGNOSIS — Z7982 Long term (current) use of aspirin: Secondary | ICD-10-CM | POA: Diagnosis not present

## 2019-05-09 DIAGNOSIS — I7100 Dissection of unspecified site of aorta: Secondary | ICD-10-CM | POA: Diagnosis present

## 2019-05-09 DIAGNOSIS — I69351 Hemiplegia and hemiparesis following cerebral infarction affecting right dominant side: Secondary | ICD-10-CM | POA: Diagnosis present

## 2019-05-09 DIAGNOSIS — I639 Cerebral infarction, unspecified: Secondary | ICD-10-CM | POA: Diagnosis not present

## 2019-05-09 DIAGNOSIS — I63412 Cerebral infarction due to embolism of left middle cerebral artery: Secondary | ICD-10-CM | POA: Diagnosis not present

## 2019-05-09 DIAGNOSIS — Z9889 Other specified postprocedural states: Secondary | ICD-10-CM

## 2019-05-09 DIAGNOSIS — I308 Other forms of acute pericarditis: Secondary | ICD-10-CM

## 2019-05-09 MED ORDER — OSMOLITE 1.5 CAL PO LIQD: 1000.0000 mL | ORAL | 0 refills | Status: DC

## 2019-05-09 MED ORDER — BISACODYL 5 MG PO TBEC
10.0000 mg | DELAYED_RELEASE_TABLET | Freq: Every day | ORAL | Status: DC
  Administered 2019-05-10 – 2019-05-14 (×3): 10 mg via ORAL
  Filled 2019-05-09 (×6): qty 2

## 2019-05-09 MED ORDER — DOCUSATE SODIUM 100 MG PO CAPS
200.0000 mg | ORAL_CAPSULE | Freq: Every day | ORAL | Status: DC
  Administered 2019-05-10 – 2019-05-11 (×2): 200 mg via ORAL
  Filled 2019-05-09 (×6): qty 2

## 2019-05-09 MED ORDER — RESOURCE THICKENUP CLEAR PO POWD: 125.0000 g | ORAL | Status: DC | PRN

## 2019-05-09 MED ORDER — ONDANSETRON HCL 4 MG/2ML IJ SOLN: 4.0000 mg | Freq: Four times a day (QID) | INTRAMUSCULAR | Status: DC | PRN

## 2019-05-09 MED ORDER — TRAMADOL HCL 50 MG PO TABS
50.0000 mg | ORAL_TABLET | ORAL | Status: DC | PRN
  Administered 2019-05-09: 50 mg via ORAL
  Administered 2019-05-10: 100 mg via ORAL
  Administered 2019-05-10: 50 mg via ORAL
  Administered 2019-05-10 – 2019-05-11 (×2): 100 mg via ORAL
  Administered 2019-05-12: 50 mg via ORAL
  Filled 2019-05-09 (×2): qty 2
  Filled 2019-05-09: qty 1
  Filled 2019-05-09: qty 2
  Filled 2019-05-09: qty 1
  Filled 2019-05-09: qty 2

## 2019-05-09 MED ORDER — ROSUVASTATIN CALCIUM 20 MG PO TABS: 20.0000 mg | ORAL_TABLET | Freq: Every day | ORAL | Status: DC

## 2019-05-09 MED ORDER — INFLUENZA VAC SPLIT QUAD 0.5 ML IM SUSY: 0.5000 mL | PREFILLED_SYRINGE | Freq: Once | INTRAMUSCULAR | 0 refills | Status: DC

## 2019-05-09 MED ORDER — FOLIC ACID 1 MG PO TABS
1.0000 mg | ORAL_TABLET | Freq: Every day | ORAL | Status: DC
  Administered 2019-05-10 – 2019-05-15 (×6): 1 mg via ORAL
  Filled 2019-05-09 (×6): qty 1

## 2019-05-09 MED ORDER — ONDANSETRON HCL 4 MG PO TABS: 4.0000 mg | ORAL_TABLET | Freq: Four times a day (QID) | ORAL | Status: DC | PRN

## 2019-05-09 MED ORDER — PANTOPRAZOLE SODIUM 40 MG PO TBEC: 40.0000 mg | DELAYED_RELEASE_TABLET | Freq: Every day | ORAL | Status: DC

## 2019-05-09 MED ORDER — COLCHICINE 0.6 MG PO TABS
0.6000 mg | ORAL_TABLET | Freq: Two times a day (BID) | ORAL | Status: DC
  Administered 2019-05-09 – 2019-05-15 (×12): 0.6 mg via ORAL
  Filled 2019-05-09 (×12): qty 1

## 2019-05-09 MED ORDER — ROSUVASTATIN CALCIUM 20 MG PO TABS
20.0000 mg | ORAL_TABLET | Freq: Every day | ORAL | Status: DC
  Administered 2019-05-09 – 2019-05-14 (×6): 20 mg via ORAL
  Filled 2019-05-09 (×6): qty 1

## 2019-05-09 MED ORDER — METOPROLOL TARTRATE 25 MG PO TABS: 25.0000 mg | ORAL_TABLET | Freq: Two times a day (BID) | ORAL | Status: DC

## 2019-05-09 MED ORDER — RESOURCE THICKENUP CLEAR PO POWD
ORAL | Status: DC | PRN
  Filled 2019-05-09: qty 125

## 2019-05-09 MED ORDER — ASPIRIN 81 MG PO CHEW
324.0000 mg | CHEWABLE_TABLET | Freq: Every day | ORAL | Status: DC
  Administered 2019-05-14: 324 mg
  Filled 2019-05-09 (×6): qty 4

## 2019-05-09 MED ORDER — PANTOPRAZOLE SODIUM 40 MG PO PACK
40.0000 mg | PACK | Freq: Every day | ORAL | Status: DC
  Administered 2019-05-10 – 2019-05-11 (×2): 40 mg
  Filled 2019-05-09 (×2): qty 20

## 2019-05-09 MED ORDER — COLCHICINE 0.6 MG PO TABS: 0.6000 mg | ORAL_TABLET | Freq: Two times a day (BID) | ORAL | Status: DC

## 2019-05-09 MED ORDER — PRO-STAT SUGAR FREE PO LIQD: 30.0000 mL | Freq: Two times a day (BID) | ORAL | 0 refills | Status: DC

## 2019-05-09 MED ORDER — ACETAMINOPHEN 160 MG/5ML PO SOLN: 650.0000 mg | Freq: Four times a day (QID) | ORAL | 0 refills | Status: DC | PRN

## 2019-05-09 MED ORDER — ASPIRIN 325 MG PO TBEC: 325.0000 mg | DELAYED_RELEASE_TABLET | Freq: Every day | ORAL | 0 refills | Status: AC

## 2019-05-09 MED ORDER — ACETAMINOPHEN 325 MG PO TABS: 325.0000 mg | ORAL_TABLET | ORAL | Status: DC | PRN

## 2019-05-09 MED ORDER — TRAMADOL HCL 50 MG PO TABS: 50.0000 mg | ORAL_TABLET | ORAL | Status: DC | PRN

## 2019-05-09 MED ORDER — METOPROLOL TARTRATE 25 MG PO TABS
25.0000 mg | ORAL_TABLET | Freq: Two times a day (BID) | ORAL | Status: DC
  Administered 2019-05-09 – 2019-05-15 (×12): 25 mg via ORAL
  Filled 2019-05-09 (×12): qty 1

## 2019-05-09 MED ORDER — ASPIRIN EC 325 MG PO TBEC
325.0000 mg | DELAYED_RELEASE_TABLET | Freq: Every day | ORAL | Status: DC
  Administered 2019-05-10 – 2019-05-15 (×5): 325 mg via ORAL
  Filled 2019-05-09 (×6): qty 1

## 2019-05-09 MED ORDER — PNEUMOCOCCAL VAC POLYVALENT 25 MCG/0.5ML IJ INJ: 0.5000 mL | INJECTION | Freq: Once | INTRAMUSCULAR | 0 refills | Status: DC

## 2019-05-09 MED ORDER — DOCUSATE SODIUM 100 MG PO CAPS: 200.0000 mg | ORAL_CAPSULE | Freq: Every day | ORAL | 0 refills | Status: DC

## 2019-05-09 MED ORDER — BISACODYL 10 MG RE SUPP
10.0000 mg | Freq: Every day | RECTAL | Status: DC
  Filled 2019-05-09 (×3): qty 1

## 2019-05-09 NOTE — TOC Transition Note (Signed)
Transition of Care [facility]) - CM/SW Discharge Note   Patient Details  Name: Erik Parker MRN: [id number] Date of Birth: 06-Jan-1966  [facility] of [facility] CM/SW Contact:  Leone Haven, RN Phone Number: 05/09/2019, 9:31 AM   Clinical Narrative:    Patient for dc to CIR today.   Final next level of care: IP Rehab Facility Barriers to Discharge: No Barriers Identified   Patient Goals and CMS Choice        Discharge Placement                       Discharge Plan and Services                                     Social Determinants of Health (SDOH) Interventions     Readmission Risk Interventions No flowsheet data found.

## 2019-05-09 NOTE — Progress Notes (Signed)
Jamse Arn, MD  Physician  Physical Medicine and Rehabilitation  PMR Pre-admission  Addendum  Date of Service:  05/08/2019  3:02 PM      Related encounter: ED to Hosp-Admission (Discharged) from 05/01/2019 in [facility] all [x] Manual[x] Template[x] Copied  Added by: [x] Cristina Gong, RN[x] Jamse Arn, MD  [] Hover for details PMR Admission Coordinator Pre-Admission Assessment   Patient: Erik Parker is an 54 y.o., male MRN: [id number] DOB: [birth year] Height: 6' (182.9 cm) Weight: 105.9 kg                                                                                                                                                  Insurance Information HMO:     PPO: yes     PCP:      IPA:      80/20:      OTHER:  PRIMARY: [employer]      Policy#: [id number]      Subscriber: pt CM Name: Anderson Malta      Phone#: [phone]     Fax#: [phone] Pre-Cert#: [id number]   Approved until 3/16 with updates due 3/17   Employer:  Benefits:  Phone #: [phone]     Name: 3/8 Eff. Date: 03/01/2017     Deduct: $2800      Out of Pocket Max: $4500 includes deductible      Life Max: none CIR: 80%      SNF: 80% 40 days Outpatient: 80%     Co-Pay: 60 visits combined Home Health: 80%      Co-Pay: 40 visits per year DME: 80%     Co-Pay: 20% Providers: in network  SECONDARY: none         Medicaid Application Date:       Case Manager:  Disability Application Date:       Case Worker:    The "Data Collection Information Summary" for patients in Inpatient Rehabilitation Facilities with attached "Privacy Act [place] Records" was provided and verbally reviewed with: N/A   Emergency Contact Information Contact Information       Name Relation Home Work Mobile    Ross,[name] Significant other [phone]   [phone]         Current Medical History  Patient Admitting Diagnosis: aortic dissection s/p repair with CVA     History of Present Illness:  54 year old right-handed male history of hypertension, hyperlipidemia as well as tobacco abuse.  Patient presented [facility]/01/[facility] with chest pain, right facial droop and inability to speak with right arm weakness.   Troponin negative, BNP within normal limits 31, WBC 15,600, chemistries unremarkable.  CT of the head showed left frontal operculum acute nonhemorrhagic infarction.  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  No superimposed intracranial emergent large vessel occlusion.  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 Vickey SagesAtkins.  Intraoperative echocardiogram with ejection fraction of 50%.  Follow-up MRI of the brain showed scattered areas of acute infarction in both hemispheres, more extensive on the left than right, consistent with embolic infarctions.  The largest area of involvement 3.5-4 cm stroke in the left frontal operculum with mild swelling and minimal petechial blood products.  Scattered other strokes in the left hemisphere, next largest measuring 2 cm at the left parieto-occipital junction.  Single acute infarction in the right hemisphere measuring less than a centimeter in size in the right frontal lobe.  Maintained on aspirin 325 mg daily for CVA prophylaxis.  Acute postoperative pericarditis maintained on colchicine.  Follow-up cardiology services for episode of V. tach 3/4 of 23 beats stat echocardiogram ejection fraction 45 to 50% no regional wall motion abnormalities to suggest ACS.  Patient did remain on metoprolol 25 mg twice daily.  Dysphagia #1 nectar thick liquid diet as well as a nasogastric tube remained in place and awaiting plan for removal of tube.     Complete NIHSS TOTAL: 5 Glasgow Coma Scale Score: 15   Past Medical History      Past Medical History:  Diagnosis Date  . Hyperlipidemia    . Hypertension         Family History  family history includes Hypertension in his father and mother.   Prior Rehab/Hospitalizations:  Has the patient had prior rehab or hospitalizations prior to admission? Yes   Has the patient had major surgery during 100 days prior to admission? Yes   Current Medications    Current Facility-Administered Medications:  .  0.9 %  sodium chloride infusion, 250 mL, Intravenous, Continuous, Barrett, Erin R, PA-C .  0.9 %  sodium chloride infusion, , Intravenous, PRN, Linden DolinAtkins, Broadus Z, MD, Stopped at 05/04/19 0539 .  acetaminophen (TYLENOL) 160 MG/5ML solution 650 mg, 650 mg, Per Tube, Once **OR** acetaminophen (TYLENOL) suppository 650 mg, 650 mg, Rectal, Once, Barrett, Erin R, PA-C .  aspirin EC tablet 325 mg, 325 mg, Oral, Daily, 325 mg at 05/09/19 0951 **OR** aspirin chewable tablet 324 mg, 324 mg, Per Tube, Daily, Barrett, Erin R, PA-C, 324 mg at 05/08/19 0912 .  bisacodyl (DULCOLAX) EC tablet 10 mg, 10 mg, Oral, Daily, 10 mg at 05/09/19 0951 **OR** bisacodyl (DULCOLAX) suppository 10 mg, 10 mg, Rectal, Daily, Barrett, Erin R, PA-C .  Chlorhexidine Gluconate Cloth 2 % PADS 6 each, 6 each, Topical, Daily, Linden DolinAtkins, Broadus Z, MD, 6 each at 05/08/19 0913 .  colchicine tablet 0.6 mg, 0.6 mg, Oral, BID, Vickey SagesAtkins, Merri BrunetteBroadus Z, MD, 0.6 mg at 05/09/19 0951 .  docusate sodium (COLACE) capsule 200 mg, 200 mg, Oral, Daily, Barrett, Erin R, PA-C, 200 mg at 05/09/19 0950 .  feeding supplement (OSMOLITE 1.5 CAL) liquid 1,000 mL, 1,000 mL, Per Tube, Q24H, Vickey SagesAtkins, Merri BrunetteBroadus Z, MD, Stopped at 05/08/19 [phone] .  feeding supplement (PRO-STAT SUGAR FREE 64) liquid 30 mL, 30 mL, Per Tube, BID, Atkins, Merri BrunetteBroadus Z, MD, 30 mL at 05/09/19 0954 .  folic acid (FOLVITE) tablet 1 mg, 1 mg, Oral, Daily, Barrett, Erin R, PA-C, 1 mg at 05/08/19 0912 .  influenza vac split quadrivalent PF (FLUARIX) injection 0.5 mL, 0.5 mL, Intramuscular, Prior to discharge, Atkins, Merri BrunetteBroadus Z, MD .  MEDLINE mouth rinse, 15 mL, Mouth  Rinse, BID, Atkins, Merri BrunetteBroadus Z, MD, 15 mL at 05/08/19  2149 .  metoprolol tartrate (LOPRESSOR) tablet 25 mg, 25 mg, Oral, BID, Donata Clay, Theron Arista, MD, 25 mg at 05/09/19 [phone] .  ondansetron (ZOFRAN) injection 4 mg, 4 mg, Intravenous, Q6H PRN, Barrett, Erin R, PA-C, 4 mg at 05/06/19 0814 .  oxyCODONE (Oxy IR/ROXICODONE) immediate release tablet 5-10 mg, 5-10 mg, Oral, Q3H PRN, Barrett, Erin R, PA-C, 10 mg at 05/09/19 0949 .  pantoprazole (PROTONIX) injection 40 mg, 40 mg, Intravenous, Q24H, Atkins, Merri Brunette, MD, 40 mg at 05/09/19 0950 .  pneumococcal 23 valent vaccine (PNEUMOVAX-23) injection 0.5 mL, 0.5 mL, Intramuscular, Prior to discharge, Vickey Sages, Merri Brunette, MD .  Resource ThickenUp Clear, , Oral, PRN, Mosetta Anis, RPH .  rosuvastatin (CRESTOR) tablet 20 mg, 20 mg, Oral, q1800, Linden Dolin, MD, 20 mg at 05/08/19 1630 .  sodium chloride flush (NS) 0.9 % injection 3 mL, 3 mL, Intravenous, Q12H, Barrett, Erin R, PA-C, 3 mL at 05/08/19 2149 .  sodium chloride flush (NS) 0.9 % injection 3 mL, 3 mL, Intravenous, PRN, Barrett, Erin R, PA-C .  traMADol (ULTRAM) tablet 50-100 mg, 50-100 mg, Oral, Q4H PRN, Barrett, Erin R, PA-C, 100 mg at 05/06/19 5456   Patients Current Diet:  Diet Order                  DIET - DYS 1 Room service appropriate? Yes with Assist; Fluid consistency: Nectar Thick  Diet effective now                      Precautions / Restrictions Precautions Precautions: Sternal, Fall Precaution Booklet Issued: No Precaution Comments: Reviewed sternal precautions; expressive aphasia. Able to write a little to communicate Restrictions Weight Bearing Restrictions: Yes(Sternal precautions) Other Position/Activity Restrictions: sternal precautions    Has the patient had 2 or more falls or a fall with injury in the past year?No   Prior Activity Level Community (5-7x/wk): working; driving, independent   Prior Functional Level Prior Function Level of Independence:  Independent Comments: able to nod yes that he was working, but unable to state. Was fully independent prior   Self Care: Did the patient need help bathing, dressing, using the toilet or eating?  Independent   Indoor Mobility: Did the patient need assistance with walking from room to room (with or without device)? Independent   Stairs: Did the patient need assistance with internal or external stairs (with or without device)? Independent   Functional Cognition: Did the patient need help planning regular tasks such as shopping or remembering to take medications? Independent   Home Assistive Devices / Equipment Home Assistive Devices/Equipment: None Home Equipment: None   Prior Device Use: Indicate devices/aids used by the patient prior to current illness, exacerbation or injury? None of the above   Current Functional Level Cognition   Overall Cognitive Status: Difficult to assess Difficult to assess due to: Impaired communication Orientation Level: Oriented X4 General Comments: Pt with expressive difficulties. While in hallway,worked on dual tasking with mobility (speech with counting, days of week, months of year etc)    Extremity Assessment (includes Sensation/Coordination)   Upper Extremity Assessment: Generalized weakness LUE Deficits / Details: decreased coordination, but able to open comntainers with increased time  Lower Extremity Assessment: Generalized weakness     ADLs   Overall ADL's : Needs assistance/impaired Eating/Feeding: Supervision/ safety Grooming: Supervision/safety, Min guard, Standing Grooming Details (indicate cue type and reason): standing at sink x7 mins for light ADL Upper Body Bathing: Moderate assistance, Sitting, Cueing  for UE precautions Lower Body Bathing: Moderate assistance, Maximal assistance, Sit to/from stand, Sitting/lateral leans Upper Body Dressing : Moderate assistance, Sitting, Cueing for UE precautions Lower Body Dressing: Moderate  assistance Lower Body Dressing Details (indicate cue type and reason): unable to fully perform LB dressing Toilet Transfer: Minimal assistance, Cueing for safety, Ambulation, RW Toilet Transfer Details (indicate cue type and reason): cues for sternal precaution with transfer Toileting- Clothing Manipulation and Hygiene: Minimal assistance, Sit to/from stand Tub/ Shower Transfer: Minimal assistance, Shower seat, Rolling walker, Moderate assistance Functional mobility during ADLs: Min guard, Minimal assistance, Cueing for safety General ADL Comments: Pt's O2 >97%on RA and HR <99 BPM with exertion. Pt performing ADL functional mobility with no AD and safety cues to keep from swaying side to s     Mobility   Overal bed mobility: Needs Assistance Bed Mobility: Supine to Sit Supine to sit: Mod assist, HOB elevated Sit to supine: Mod assist General bed mobility comments: Up in chair upon PT arrival.     Transfers   Overall transfer level: Needs assistance Equipment used: None Transfers: Sit to/from Stand Sit to Stand: Min guard General transfer comment: Pt using momentum to stand from chair, Holding heart pillow during transfer.     Ambulation / Gait / Stairs / Wheelchair Mobility   Ambulation/Gait Ambulation/Gait assistance: Editor, commissioning (Feet): 120 Feet Assistive device: None Gait Pattern/deviations: Step-through pattern, Decreased step length - right, Decreased step length - left, Drifts right/left General Gait Details: Slow, guarded gait with decreased arm swing; +walk and talk test. Difficulty dual tasking wtih walking, min A needed for balance challenges. Gait velocity: reduced Gait velocity interpretation: 1.31 - 2.62 ft/sec, indicative of limited community ambulator     Posture / Balance Dynamic Sitting Balance Sitting balance - Comments: supervision Balance Overall balance assessment: Needs assistance Sitting-balance support: Feet supported, No upper extremity  supported Sitting balance-Leahy Scale: Good Sitting balance - Comments: supervision Standing balance support: During functional activity Standing balance-Leahy Scale: Fair Standing balance comment: using RW for support at sink High level balance activites: Side stepping, Backward walking, Direction changes, Turns, Sudden stops, Head turns High Level Balance Comments: Tolerated balance activities above with mild deviations in gait needing MIn A at times; needed to stop and talk to process cognitive tasks; decreased gait speed. Standardized Balance Assessment Standardized Balance Assessment : Dynamic Gait Index Dynamic Gait Index Level Surface: Mild Impairment Change in Gait Speed: Moderate Impairment Gait with Horizontal Head Turns: Mild Impairment Gait with Vertical Head Turns: Mild Impairment Gait and Pivot Turn: Normal Step Over Obstacle: Mild Impairment Step Around Obstacles: Normal Steps: Moderate Impairment Total Score: 16     Special needs/care consideration BiPAP/CPAP CPM Continuous Drip IV Dialysis         Life Vest Oxygen Special Bed Trach Size Wound Vac  Skin  surgical incision                          43 inches 10 FR left nare cortrak placed 3/5 Bowel mgmt: continent Bladder mgmt: continent Diabetic mgmt Behavioral consideration  Chemo/radiation  Designated visitor is English as a second language teacher    Previous Home Environment  Living Arrangements: Spouse/significant other  Lives With: Spouse, Son Available Help at Discharge: Family, Available 24 hours/day(His Mom is available when wife works) Type of Home: House Home Layout: One level Home Access: Stairs to enter Entrance Stairs-Rails: Can reach both Secretary/administrator of Steps: 5 Bathroom Shower/Tub: Health visitor: Standard  Bathroom Accessibility: Yes How Accessible: Accessible via walker Home Care Services: No   Discharge Living Setting Plans for Discharge Living Setting: Patient's home, Lives with  (comment)(wife and 81 year old child; other children not in home) Type of Home at Discharge: House Discharge Home Layout: One level Discharge Home Access: Stairs to enter Entrance Stairs-Rails: Right, Left, Can reach both Entrance Stairs-Number of Steps: 5 Discharge Bathroom Shower/Tub: Walk-in shower Discharge Bathroom Toilet: Standard Discharge Bathroom Accessibility: Yes How Accessible: Accessible via walker Does the patient have any problems obtaining your medications?: No   Social/Family/Support Systems Patient Roles: Spouse, Parent(employee) Contact Information: wife, English as a second language teacher Anticipated Caregiver: wife and Mom Anticipated Industrial/product designer Information: see above Ability/Limitations of Caregiver: wife works Engineer, structural Availability: 24/7 Discharge Plan Discussed with Primary Caregiver: Yes Is Caregiver In Agreement with Plan?: Yes Does Caregiver/Family have Issues with Lodging/Transportation while Pt is in Rehab?: No   Goals/Additional Needs Patient/Family Goal for Rehab: Mod I to supervision PT, OT, and SLP Expected length of stay: [facility]OS 7-11 days   Decrease burden of Care through IP rehab admission: n/a   Possible need for SNF placement upon discharge: not anticipated   Patient Condition: This patient's medical and functional status has changed since the consult dated 05/04/2019 in which the Rehabilitation Physician determined and documented that the patient was potentially appropriate for intensive rehabilitative care in an inpatient rehabilitation facility. Issues have been addressed and update has been discussed with Dr. Allena Katz and patient now appropriate for inpatient rehabilitation. Will admit to inpatient rehab today.    Preadmission Screen Completed By:  Clois Dupes, RN, 05/09/2019 10:23 AM ______________________________________________________________________   Discussed status with Dr. Allena Katz on 05/09/2019 at  1025 and received approval for admission today.    Admission Coordinator:  Clois Dupes, time 5170 Date 05/09/2019     Revision History

## 2019-05-09 NOTE — H&P (Signed)
Physical Medicine and Rehabilitation Admission H&P    Chief Complaint  Patient presents with  . Facial Droop    right  : HPI: Erik Parker is a 54 year old right-handed male history of hypertension, hyperlipidemia as well as tobacco abuse.  History taken from chart review due to expressive aphasia.  Patient lives with spouse.  Independent prior to admission working full-time.  1 level home 5 steps to entry.  Wife works 4 hours a day.  He presented on 05/01/2019 with right facial droop, chest pain, and right arm weakness. Troponin negative, BNP within normal limits 31, WBC 15,600, chemistries unremarkable.  CT of the head showed left frontal operculum acute nonhemorrhagic infarction.  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  No superimposed intracranial emergent large vessel occlusion.  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 Vickey Sages.  Intraoperative echocardiogram with ejection fraction of 50%. Follow-up MRI of the brain showed scattered areas of acute infarction in both hemispheres, more extensive on the left than right, consistent with embolic infarctions.  The largest area of involvement 3.5-4 cm stroke in the left frontal operculum with mild swelling and minimal petechial blood products.  Scattered other strokes in the left hemisphere, next largest measuring 2 cm at the left parieto-occipital junction.  Single acute infarction in the right hemisphere measuring less than a centimeter in size in the right frontal lobe.  Maintained on aspirin 325 mg daily for CVA prophylaxis.  Acute postoperative pericarditis maintained on colchicine.  Follow-up cardiology services for episode of V. tach 3/4 of 23 beats stat echocardiogram ejection fraction 45 to 50% no regional wall motion abnormalities to suggest ACS.  Patient did remain on metoprolol 25 mg twice daily.  Hospital course further  complicated by post stroke dysphagia.  Dysphagia #1 nectar thick liquid diet as well as a nasogastric tube remained in place and awaiting plan for removal of tube.  Therapy evaluations completed and patient was admitted for a comprehensive rehab program.  Please see preadmission assessment from earlier today as well.  Review of Systems  Constitutional: Positive for malaise/fatigue. Negative for chills and fever.  HENT: Negative for hearing loss.   Eyes: Negative for blurred vision and double vision.  Respiratory: Positive for shortness of breath. Negative for cough.   Cardiovascular: Negative.   Gastrointestinal: Positive for constipation. Negative for heartburn, nausea and vomiting.  Genitourinary: Negative for dysuria, flank pain and hematuria.  Skin: Negative for rash.  Neurological: Positive for speech change and weakness. Negative for focal weakness and seizures.   Past Medical History:  Diagnosis Date  . Hyperlipidemia   . Hypertension    Past Surgical History:  Procedure Laterality Date  . ACHILLES TENDON SURGERY Right 11/10/2016   Procedure: ACHILLES TENDON REPAIR VIA SPEED BRIDGE;  Surgeon: [name], DPM;  Location: [facility];  Service: Podiatry;  Laterality: Right;  . HEEL SPUR RESECTION Right 11/10/2016   Procedure: HEEL SPUR RESECTION (HAGLUNDS DEFORMITY);  Surgeon: [name], DPM;  Location: [facility];  Service: Podiatry;  Laterality: Right;  . REPAIR OF ACUTE ASCENDING THORACIC AORTIC DISSECTION N/A 05/01/2019   Procedure: REPAIR OF ACUTE ASCENDING THORACIC AORTIC DISSECTION USING HEMOSHIELD GRAFT SIZE ;  Surgeon: Linden Dolin, MD;  Location: [facility];  Service: Cardiothoracic;  Laterality: N/A;   Family History  Problem Relation Age of Onset  . Hypertension Mother   . Hypertension Father    Social  History:  reports that he has been smoking cigarettes. He has a 4.50 pack-year smoking history. He has never used smokeless tobacco. He reports current  alcohol use. He reports that he does not use drugs. Allergies: No Known Allergies Medications Prior to Admission  Medication Sig Dispense Refill  . acetaminophen (TYLENOL) 160 MG/5ML solution Place 20.3 mLs (650 mg total) into feeding tube every 6 (six) hours as needed. 120 mL 0  . Amino Acids-Protein Hydrolys (FEEDING SUPPLEMENT, PRO-STAT SUGAR FREE 64,) LIQD Place 30 mLs into feeding tube 2 (two) times daily. 887 mL 0  . aspirin EC 325 MG EC tablet Take 1 tablet (325 mg total) by mouth daily. 30 tablet 0  . colchicine 0.6 MG tablet Take 1 tablet (0.6 mg total) by mouth 2 (two) times daily.    Marland Kitchen docusate sodium (COLACE) 100 MG capsule Take 2 capsules (200 mg total) by mouth daily. 10 capsule 0  . influenza vac split quadrivalent PF (FLUARIX) 0.5 ML injection Inject 0.5 mLs into the muscle once for 1 dose. 0.5 mL 0  . Maltodextrin-Xanthan Gum (RESOURCE THICKENUP CLEAR) POWD Take 125 g by mouth as needed.    . metoprolol tartrate (LOPRESSOR) 25 MG tablet Take 1 tablet (25 mg total) by mouth 2 (two) times daily.    . Nutritional Supplements (FEEDING SUPPLEMENT, OSMOLITE 1.5 CAL,) LIQD Place 1,000 mLs into feeding tube daily.  0  . pantoprazole (PROTONIX) 40 MG tablet Take 1 tablet (40 mg total) by mouth daily.    . pneumococcal 23 valent vaccine (PNEUMOVAX-23) 25 MCG/0.5ML injection Inject 0.5 mLs into the muscle once for 1 dose. 2.5 mL 0  . rosuvastatin (CRESTOR) 20 MG tablet Take 1 tablet (20 mg total) by mouth daily at 6 PM.    . traMADol (ULTRAM) 50 MG tablet Take 1 tablet (50 mg total) by mouth every 4 (four) hours as needed for moderate pain. 30 tablet     Drug Regimen Review Drug regimen was reviewed and remains appropriate with no significant issues identified  Home: Home Living Family/patient expects to be discharged to:: Private residence Living Arrangements: Spouse/significant other Available Help at Discharge: Family, Available 24 hours/day(His Mom is available when wife  works) Type of Home: House Home Access: Stairs to enter Secretary/administrator of Steps: 5 Entrance Stairs-Rails: Can reach both Home Layout: One level Bathroom Shower/Tub: Health visitor: Pharmacist, community: Yes Home Equipment: None  Lives With: Spouse, Son   Functional History: Prior Function Level of Independence: Independent Comments: able to nod yes that he was working, but unable to state. Was fully independent prior  Functional Status:  Mobility: Bed Mobility Overal bed mobility: Needs Assistance Bed Mobility: Supine to Sit Supine to sit: Mod assist, HOB elevated Sit to supine: Mod assist General bed mobility comments: Up in chair upon PT arrival. Transfers Overall transfer level: Needs assistance Equipment used: None Transfers: Sit to/from Stand Sit to Stand: Min guard General transfer comment: Pt using momentum to stand from chair, Holding heart pillow during transfer. Ambulation/Gait Ambulation/Gait assistance: Min assist Gait Distance (Feet): 120 Feet Assistive device: None Gait Pattern/deviations: Step-through pattern, Decreased step length - right, Decreased step length - left, Drifts right/left General Gait Details: Slow, guarded gait with decreased arm swing; +walk and talk test. Difficulty dual tasking wtih walking, min A needed for balance challenges. Gait velocity: reduced Gait velocity interpretation: 1.31 - 2.62 ft/sec, indicative of limited community ambulator    ADL: ADL Overall ADL's : Needs assistance/impaired Eating/Feeding: Supervision/  safety Grooming: Supervision/safety, Min guard, Standing Grooming Details (indicate cue type and reason): standing at sink x7 mins for light ADL Upper Body Bathing: Moderate assistance, Sitting, Cueing for UE precautions Lower Body Bathing: Moderate assistance, Maximal assistance, Sit to/from stand, Sitting/lateral leans Upper Body Dressing : Moderate assistance, Sitting, Cueing  for UE precautions Lower Body Dressing: Moderate assistance Lower Body Dressing Details (indicate cue type and reason): unable to fully perform LB dressing Toilet Transfer: Minimal assistance, Cueing for safety, Ambulation, RW Toilet Transfer Details (indicate cue type and reason): cues for sternal precaution with transfer Toileting- Clothing Manipulation and Hygiene: Minimal assistance, Sit to/from stand Tub/ Shower Transfer: Minimal assistance, Shower seat, Rolling walker, Moderate assistance Functional mobility during ADLs: Min guard, Minimal assistance, Cueing for safety General ADL Comments: Pt's O2 >97%on RA and HR <99 BPM with exertion. Pt performing ADL functional mobility with no AD and safety cues to keep from swaying side to s  Cognition: Cognition Overall Cognitive Status: Difficult to assess Orientation Level: Oriented X4 Cognition Arousal/Alertness: Awake/alert Behavior During Therapy: WFL for tasks assessed/performed Overall Cognitive Status: Difficult to assess General Comments: Pt with expressive difficulties. While in hallway,worked on dual tasking with mobility (speech with counting, days of week, months of year etc) Difficult to assess due to: Impaired communication  Physical Exam: Blood pressure 125/79, pulse 88, temperature 98.7 F (37.1 C), temperature source Oral, resp. rate 17, height 6' (1.829 m), weight 105.9 kg, SpO2 97 %. Physical Exam  Vitals reviewed. Constitutional: He appears well-developed.  Obese.  HENT:  Head: Normocephalic and atraumatic.  + Cortrak  Eyes: EOM are normal. Right eye exhibits no discharge. Left eye exhibits no discharge.  Neck: No tracheal deviation present. No thyromegaly present.  Respiratory: Effort normal. No respiratory distress.  + Emmett  GI: Soft. He exhibits no distension.  Musculoskeletal:     Comments: No edema or tenderness in extremities  Neurological: He is alert.  Patient is alert in no acute distress.   Makes eye  contact with examiner.   Motor: 4+/5 throughout Expressive aphasia Dysarthria  Skin: Skin is warm and dry.  Chest incision C/D/  Psychiatric: He has a normal mood and affect. His behavior is normal.    Results for orders placed or performed during the hospital encounter of 05/01/19 (from the past 48 hour(s))  Basic metabolic panel     Status: Abnormal   Collection Time: 05/08/19  2:09 AM  Result Value Ref Range   Sodium 141 135 - 145 mmol/L   Potassium 4.4 3.5 - 5.1 mmol/L   Chloride 109 98 - 111 mmol/L   CO2 23 22 - 32 mmol/L   Glucose, Bld 120 (H) 70 - 99 mg/dL    Comment: Glucose reference range applies only to samples taken after fasting for at least 8 hours.   BUN 16 6 - 20 mg/dL   Creatinine, Ser 0.65 0.61 - 1.24 mg/dL   Calcium 9.0 8.9 - 10.3 mg/dL   GFR calc non Af Amer >60 >60 mL/min   GFR calc Af Amer >60 >60 mL/min   Anion gap 9 5 - 15    Comment: Performed at [facility] [address] [place], [place] [address]  Magnesium     Status: None   Collection Time: 05/08/19  2:09 AM  Result Value Ref Range   Magnesium 2.0 1.7 - 2.4 mg/dL    Comment: Performed at [facility] [address] [place],  [address]   No  results found.     Medical Problem List and Plan: 1.  Right side weakness with facial droop aphasia/dysphagia secondary to left opercular infarction, thromboembolic in the setting of aortic dissection.S/P repair of ascending thoracic aortic dissection 05/01/2019.Sternal precautions  -patient may not shower  -ELOS/Goals: 7-12 days/Supervision/mod I with PT/OT and supervision/min a with SLP.  Admit to CIR 2.  Antithrombotics: -DVT/anticoagulation: SCDs.  -antiplatelet therapy: Aspirin 325 mg daily 3. Pain Management: Tramadol as needed 4. Mood: Provide emotional support  -antipsychotic agents: N/A 5. Neuropsych: This patient is capable of making decisions on his own behalf. 6. Skin/Wound Care: Routine skin checks 7.  Fluids/Electrolytes/Nutrition: Routine in and outs.  CMP ordered. 8.  Post stroke dysphagia.  Dysphagia #1 nectar thick liquids.  Follow-up speech therapy 9.  Hypertension/V. tach.  Lopressor 25 mg twice daily.  Follow-up cardiology services  Monitor with increased mobility 10.  Postoperative pericarditis.  Continue colchicine 11.  Tobacco abuse.  Counsel 12.  Hyperlipidemia.  Crestor  Erik Rossetti Angiulli, PA-C 05/09/2019  I have personally performed a face to face diagnostic evaluation, including, but not limited to relevant history and physical exam findings, of this patient and developed relevant assessment and plan.  Additionally, I have reviewed and concur with the physician assistant's documentation above.  Maryla Morrow, MD, ABPMR  The patient's status has not changed. The original post admission physician evaluation remains appropriate, and any changes from the pre-admission screening or documentation from the acute chart are noted above.   Maryla Morrow, MD, ABPMR

## 2019-05-09 NOTE — Progress Notes (Signed)
Patient arrived on rehab unit today,were he was informed about patient safety plan and rehab booklet. 

## 2019-05-09 NOTE — Progress Notes (Signed)
Report called to Rehab nurse for room 8.  Room packed up and patient via wheelchair to fourth floor with wife

## 2019-05-09 NOTE — Plan of Care (Signed)
  Problem: Consults Goal: RH STROKE PATIENT EDUCATION Description: See Patient Education module for education specifics  Outcome: Progressing Goal: Nutrition Consult-if indicated Outcome: Progressing Goal: Diabetes Guidelines if Diabetic/Glucose > 140 Description: If diabetic or lab glucose is > 140 mg/dl - Initiate Diabetes/Hyperglycemia Guidelines & Document Interventions  Outcome: Progressing   Problem: RH BOWEL ELIMINATION Goal: RH STG MANAGE BOWEL WITH ASSISTANCE Description: STG Manage Bowel with Assistance. Outcome: Progressing Flowsheets (Taken 05/09/2019 1729) STG: Pt will manage bowels with assistance: 4-Minimum assistance   Problem: RH BLADDER ELIMINATION Goal: RH STG MANAGE BLADDER WITH ASSISTANCE Description: STG Manage Bladder With Assistance Outcome: Progressing Flowsheets (Taken 05/09/2019 1729) STG: Pt will manage bladder with assistance: 4-Minimal assistance Goal: RH STG MANAGE BLADDER WITH EQUIPMENT WITH ASSISTANCE Description: STG Manage Bladder With Equipment With Assistance Outcome: Progressing Flowsheets (Taken 05/09/2019 1729) STG: Pt will manage bladder with equipment with assistance: 4-Minimal assistance   Problem: RH SKIN INTEGRITY Goal: RH STG SKIN FREE OF INFECTION/BREAKDOWN Outcome: Progressing   Problem: RH SAFETY Goal: RH STG ADHERE TO SAFETY PRECAUTIONS W/ASSISTANCE/DEVICE Description: STG Adhere to Safety Precautions With Assistance/Device. Outcome: Progressing Flowsheets (Taken 05/09/2019 1729) STG:Pt will adhere to safety precautions with assistance/device: 4-Minimal assistance   Problem: RH COGNITION-NURSING Goal: RH STG USES MEMORY AIDS/STRATEGIES W/ASSIST TO PROBLEM SOLVE Description: STG Uses Memory Aids/Strategies With Assistance to Problem Solve. Outcome: Progressing Flowsheets (Taken 05/09/2019 1729) STG: Uses memory aids/strategies with assistance: 4-Minimal assistance   Problem: RH PAIN MANAGEMENT Goal: RH STG PAIN MANAGED AT OR  BELOW PT'S PAIN GOAL Outcome: Progressing   Problem: RH KNOWLEDGE DEFICIT Goal: RH STG INCREASE KNOWLEDGE OF DIABETES Outcome: Progressing

## 2019-05-09 NOTE — Progress Notes (Signed)
Patel, Ankit Anil, MD  Physician  Physical Medicine and Rehabilitation  Consult Note  Signed  Date of Service:  05/04/2019  5:26 AM      Related encounter: Marcello FennelED to Hosp-Admission (Discharged) from 05/01/2019 in [facility] PROGRESSIVE CARE      Signed      Expand AllCollapse All   Show:Clear all [x] Manual[x] Template[] Copied  Added by: [x] Angiulli, Mcarthur Rossettianiel J, PA-C[x] Marcello FennelPatel, Ankit Anil, MD  [] Hover for details          Physical Medicine and Rehabilitation Consult Reason for Consult: Decreased functional ability with expressive aphasia Referring Physician: Dr. Renaldo FiddlerAdkins     HPI: Leonides Grillsony V Goldfarb is a 54 y.o. right-handed male with history of hypertension, hyperlipidemia, as well as tobacco abuse.  History taken from chart review due to expressive aphasia.  He presented on 05/01/2019 with chest pain.  Work-up was unremarkable and he was discharged home. Patient again returned with right facial droop and inability to speak with right arm weakness.  CTA head showed a left frontal operculum acute nonhemorrhagic infarct.  Upon further work-up, he was found to have aortic dissection and underwent repair on 05/01/2019 per Dr. Vickey SagesAtkins.  MRI of the brain personally reviewed, showing left > right embolic infarcts.  Currently on aspirin for CVA prophylaxis.  Complicated by dysphagia, placed on dysphagia #1 nectar thick liquid diet.  Therapy evaluations pending MD has requested physical medicine rehab consult.   Review of Systems  Unable to perform ROS: Patient nonverbal        Past Medical History:  Diagnosis Date  . Hyperlipidemia    . Hypertension           Past Surgical History:  Procedure Laterality Date  . ACHILLES TENDON SURGERY Right 11/10/2016    Procedure: ACHILLES TENDON REPAIR VIA SPEED BRIDGE;  Surgeon: Erskine EmeryPatel, Prayashkumar, DPM;  Location: [facility];  Service: Podiatry;  Laterality: Right;  . HEEL SPUR RESECTION Right 11/10/2016    Procedure: HEEL SPUR RESECTION (HAGLUNDS DEFORMITY);   Surgeon: Erskine EmeryPatel, Prayashkumar, DPM;  Location: [facility];  Service: Podiatry;  Laterality: Right;  . REPAIR OF ACUTE ASCENDING THORACIC AORTIC DISSECTION N/A 05/01/2019    Procedure: REPAIR OF ACUTE ASCENDING THORACIC AORTIC DISSECTION USING HEMOSHIELD GRAFT SIZE 28MM;  Surgeon: Linden DolinAtkins, Broadus Z, MD;  [facility];  Service: Cardiothoracic;  Laterality: N/A;         Family History  Problem Relation Age of Onset  . Hypertension Mother    . Hypertension Father      Social History:  reports that he has been smoking cigarettes. He has a 4.50 pack-year smoking history. He has never used smokeless tobacco. He reports current alcohol use. He reports that he does not use drugs. Allergies: No Known Allergies       Medications Prior to Admission  Medication Sig Dispense Refill  . amLODipine (NORVASC) 10 MG tablet Take 10 mg by mouth daily.   0  . aspirin EC 81 MG tablet Take 81 mg by mouth daily.      . famotidine (PEPCID) 20 MG tablet Take 1 tablet (20 mg total) by mouth 2 (two) times daily. 30 tablet 0  . folic acid (FOLVITE) 1 MG tablet Take 1 mg by mouth daily.      . hydrochlorothiazide (HYDRODIURIL) 25 MG tablet Take 25 mg by mouth daily.      Marland Kitchen. lovastatin (MEVACOR) 40 MG tablet Take 20 mg by mouth at bedtime.       . Methotrexate Sodium (  METHOTREXATE, PF,) 50 MG/2ML injection Inject 0.8 mg into the muscle once a week.       . predniSONE (DELTASONE) 20 MG tablet Take 20 mg by mouth daily with breakfast.      . sucralfate (CARAFATE) 1 g tablet Take 1 tablet (1 g total) by mouth 4 (four) times daily -  with meals and at bedtime. 21 tablet 0      Home: Home Living Family/patient expects to be discharged to:: Private residence Living Arrangements: Spouse/significant other  Functional History: Functional Status:  Mobility:   ADL:   Cognition: Cognition Overall Cognitive Status: Difficult to assess(but appears functional) Orientation Level: Oriented X4 Cognition Overall Cognitive Status:  Difficult to assess(but appears functional)   Blood pressure 116/79, pulse 82, temperature 98.1 F (36.7 C), temperature source Axillary, resp. rate 19, height 6' (1.829 m), weight 113.8 kg, SpO2 97 %. Physical Exam  Vitals reviewed. Constitutional: He appears well-developed.  Obese  HENT:  Head: Normocephalic and atraumatic.  Eyes: Right eye exhibits no discharge. Left eye exhibits no discharge. No scleral icterus.  Neck: No tracheal deviation present. No thyromegaly present.  Respiratory: Effort normal. No respiratory distress.  GI: He exhibits no distension.  Musculoskeletal:     Comments: No edema or tenderness in extremities  Neurological: He is alert.  Expressive aphasia Motor: Bilateral upper extremities proximally 1+/5, distally 4/5 (?positioning) Bilateral lower extremities: 4/5 proximal distal Sensation appears intact to light touch  Skin: Skin is warm and dry.  Psychiatric:  Unable to assess due to expressive aphasia      Lab Results Last 24 Hours  Results for orders placed or performed during the hospital encounter of 05/01/19 (from the past 24 hour(s))  Glucose, capillary     Status: Abnormal    Collection Time: 05/03/19  8:39 AM  Result Value Ref Range    Glucose-Capillary 124 (H) 70 - 99 mg/dL  Glucose, capillary     Status: Abnormal    Collection Time: 05/03/19 11:15 AM  Result Value Ref Range    Glucose-Capillary 143 (H) 70 - 99 mg/dL  Glucose, capillary     Status: Abnormal    Collection Time: 05/03/19  3:49 PM  Result Value Ref Range    Glucose-Capillary 110 (H) 70 - 99 mg/dL  Basic metabolic panel     Status: Abnormal    Collection Time: 05/03/19  6:00 PM  Result Value Ref Range    Sodium 137 135 - 145 mmol/L    Potassium 3.7 3.5 - 5.1 mmol/L    Chloride 105 98 - 111 mmol/L    CO2 22 22 - 32 mmol/L    Glucose, Bld 132 (H) 70 - 99 mg/dL    BUN 9 6 - 20 mg/dL    Creatinine, Ser 1.61 0.61 - 1.24 mg/dL    Calcium 8.6 (L) 8.9 - 10.3 mg/dL    GFR calc  non Af Amer >60 >60 mL/min    GFR calc Af Amer >60 >60 mL/min    Anion gap 10 5 - 15  Troponin I (High Sensitivity)     Status: Abnormal    Collection Time: 05/03/19  6:00 PM  Result Value Ref Range    Troponin I (High Sensitivity) 1,949 (HH) <18 ng/L  Glucose, capillary     Status: Abnormal    Collection Time: 05/03/19  9:03 PM  Result Value Ref Range    Glucose-Capillary 106 (H) 70 - 99 mg/dL  Troponin I (High Sensitivity)  Status: Abnormal    Collection Time: 05/03/19  9:20 PM  Result Value Ref Range    Troponin I (High Sensitivity) 1,784 (HH) <18 ng/L  Glucose, capillary     Status: Abnormal    Collection Time: 05/04/19 12:08 AM  Result Value Ref Range    Glucose-Capillary 124 (H) 70 - 99 mg/dL  Glucose, capillary     Status: Abnormal    Collection Time: 05/04/19  5:05 AM  Result Value Ref Range    Glucose-Capillary 102 (H) 70 - 99 mg/dL       Imaging Results (Last 48 hours)  CT ANGIO HEAD W OR WO CONTRAST   Result Date: 05/03/2019 CLINICAL DATA:  Multifocal stroke.  Vascular dissection. EXAM: CT ANGIOGRAPHY HEAD AND NECK TECHNIQUE: Multidetector CT imaging of the head and neck was performed using the standard protocol during bolus administration of intravenous contrast. Multiplanar CT image reconstructions and MIPs were obtained to evaluate the vascular anatomy. Carotid stenosis measurements (when applicable) are obtained utilizing NASCET criteria, using the distal internal carotid diameter as the denominator. CONTRAST:  42mL OMNIPAQUE IOHEXOL 350 MG/ML SOLN COMPARISON:  MRI same day.  CT studies yesterday. FINDINGS: CTA NECK FINDINGS Aortic arch: The patient has had interval surgical repair of the aorta. Based on the start of the scan, dissection flap remains visible at the arch and proximal descending aorta. The ascending aorta is not included on this scan. As seen previously, the dissection involves also the innominate artery and left common carotid artery as well as the  proximal left subclavian artery. Right carotid system: Dissection extension into the right common carotid artery appears the same, ending proximal to the bifurcation. The true lumen is larger than was seen yesterday. Dissection does not extend into the internal carotid artery. Left carotid system: As seen yesterday, the dissection extends throughout the left common carotid artery. The true lumen is mm or 2 larger than was seen yesterday. The dissection extends into the internal carotid artery as high as the C2 level but does not extend intracranial. Vertebral arteries: Both vertebral arteries perfuse normally. Skeleton: Ordinary spondylosis. Other neck: Otherwise negative Upper chest: Postoperative changes as expected. Review of the MIP images confirms the above findings CTA HEAD FINDINGS Anterior circulation: Both internal carotid arteries are patent through the siphon regions. The anterior and middle cerebral vessels show flow. No proximal stenosis. No discernible large vessel occlusion presently. Some luxury perfusion in the region of the left operculum ir infarction. Posterior circulation: Both vertebral arteries widely patent to the basilar. No basilar stenosis. Posterior circulation branch vessels are normal. Primary fetal origin of both posterior cerebral arteries. Venous sinuses: Patent and normal. Anatomic variants: None other significant. Review of the MIP images confirms the above findings IMPRESSION: Interval aortic repair, not primarily or completely evaluated. Dissection again extensions into both carotid systems but does not appear to have propagated any further. In fact, true luminal size of the common carotid regions is improved. No intracranial large vessel occlusion demonstrable presently. Small region of luxury perfusion related to the left frontal operculum infarction. Electronically Signed   By: Paulina Fusi M.D.   On: 05/03/2019 00:15    DG Chest 1 View   Result Date: 05/03/2019 CLINICAL  DATA:  CABG 2 days ago. EXAM: CHEST  1 VIEW COMPARISON:  One-view chest x-ray 05/02/2019 FINDINGS: The heart is enlarged, exaggerated by low lung volumes. Swan-Ganz catheter was removed. Mediastinal drains remain in place. Right-sided chest tube scratched at left-sided chest tube is stable. Mild  bibasilar atelectasis is improving. Surgical clips are present at the right axilla. IMPRESSION: 1. Improving bibasilar atelectasis. 2. Interval removal of Swan-Ganz catheter. Electronically Signed   By: Marin Roberts M.D.   On: 05/03/2019 06:52    CT ANGIO NECK W OR WO CONTRAST   Result Date: 05/03/2019 CLINICAL DATA:  Multifocal stroke.  Vascular dissection. EXAM: CT ANGIOGRAPHY HEAD AND NECK TECHNIQUE: Multidetector CT imaging of the head and neck was performed using the standard protocol during bolus administration of intravenous contrast. Multiplanar CT image reconstructions and MIPs were obtained to evaluate the vascular anatomy. Carotid stenosis measurements (when applicable) are obtained utilizing NASCET criteria, using the distal internal carotid diameter as the denominator. CONTRAST:  64mL OMNIPAQUE IOHEXOL 350 MG/ML SOLN COMPARISON:  MRI same day.  CT studies yesterday. FINDINGS: CTA NECK FINDINGS Aortic arch: The patient has had interval surgical repair of the aorta. Based on the start of the scan, dissection flap remains visible at the arch and proximal descending aorta. The ascending aorta is not included on this scan. As seen previously, the dissection involves also the innominate artery and left common carotid artery as well as the proximal left subclavian artery. Right carotid system: Dissection extension into the right common carotid artery appears the same, ending proximal to the bifurcation. The true lumen is larger than was seen yesterday. Dissection does not extend into the internal carotid artery. Left carotid system: As seen yesterday, the dissection extends throughout the left common carotid  artery. The true lumen is mm or 2 larger than was seen yesterday. The dissection extends into the internal carotid artery as high as the C2 level but does not extend intracranial. Vertebral arteries: Both vertebral arteries perfuse normally. Skeleton: Ordinary spondylosis. Other neck: Otherwise negative Upper chest: Postoperative changes as expected. Review of the MIP images confirms the above findings CTA HEAD FINDINGS Anterior circulation: Both internal carotid arteries are patent through the siphon regions. The anterior and middle cerebral vessels show flow. No proximal stenosis. No discernible large vessel occlusion presently. Some luxury perfusion in the region of the left operculum ir infarction. Posterior circulation: Both vertebral arteries widely patent to the basilar. No basilar stenosis. Posterior circulation branch vessels are normal. Primary fetal origin of both posterior cerebral arteries. Venous sinuses: Patent and normal. Anatomic variants: None other significant. Review of the MIP images confirms the above findings IMPRESSION: Interval aortic repair, not primarily or completely evaluated. Dissection again extensions into both carotid systems but does not appear to have propagated any further. In fact, true luminal size of the common carotid regions is improved. No intracranial large vessel occlusion demonstrable presently. Small region of luxury perfusion related to the left frontal operculum infarction. Electronically Signed   By: Paulina Fusi M.D.   On: 05/03/2019 00:15    MR BRAIN WO CONTRAST   Result Date: 05/02/2019 CLINICAL DATA:  Right facial droop and speech disturbance with right arm weakness yesterday. Follow-up. Vascular dissection. Left frontal stroke. EXAM: MRI HEAD WITHOUT CONTRAST TECHNIQUE: Multiplanar, multiecho pulse sequences of the brain and surrounding structures were obtained without intravenous contrast. COMPARISON:  CT studies done yesterday. FINDINGS: Brain: Diffusion  imaging shows a 3.5-4 cm region of acute infarction in the left frontal operculum as previously seen. There is additionally a 2 cm acute infarction at the left parietooccipital junction, a punctate acute infarction in the left occipital lobe, and a subcentimeter acute infarction in the right frontal lobe. 1 cm cortical infarction at the left posterior frontal vertex, probably the precentral  gyrus. Few other punctate foci in the left hemisphere. No evidence of mass effect or hematoma. Minimal petechial blood products in the largest left frontal operculum stroke. Elsewhere, the brain appears normal. No pre-existing vascular insults. No hydrocephalus or extra-axial collection. Vascular: Major vessels at the base of the brain show flow. Skull and upper cervical spine: Negative Sinuses/Orbits: Sinuses are clear. Tiny left mastoid effusion. Orbits negative. Other: None IMPRESSION: Scattered areas of acute infarction in both hemispheres, more extensive on the left than the right, consistent with embolic infarctions. The largest area of involvement is a 3.5-4 cm stroke in the left frontal operculum, with mild swelling and minimal petechial blood products. Scattered other strokes in the left hemisphere, next largest measuring about 2 cm at the left parietooccipital junction. Single acute infarction in the right hemisphere measuring less than a cm in size in the right frontal lobe. No brainstem or cerebellar stroke. Electronically Signed   By: Paulina Fusi M.D.   On: 05/02/2019 22:22    DG Chest Port 1 View   Result Date: 05/02/2019 CLINICAL DATA:  54 year old male status post ascending aortic replacement. EXAM: PORTABLE CHEST 1 VIEW COMPARISON:  Chest x-ray 05/01/2019. FINDINGS: Patient has been extubated. Mediastinal/pericardial drains are noted. Right IJ Cordis through which a Swan-Ganz catheter has been passed into a descending branch of the pulmonary artery in the right lower lobe. Low lung volumes. Bibasilar  opacities which likely reflect mild postoperative subsegmental atelectasis. No pleural effusions. No evidence of pulmonary edema. No pneumothorax. Cardiopericardial silhouette is mildly enlarged, stable compared to the prior study. Upper mediastinal contours are within normal limits. Status post median sternotomy. Surgical clips project over the right axillary region. IMPRESSION: 1. Postoperative changes and support apparatus, as above. 2. Low lung volumes with probable bibasilar subsegmental atelectasis. Electronically Signed   By: Trudie Reed M.D.   On: 05/02/2019 09:48    DG Abd Portable 1V   Result Date: 05/02/2019 CLINICAL DATA:  Screening for metal prior to MRI EXAM: PORTABLE ABDOMEN - 1 VIEW COMPARISON:  None. FINDINGS: Mild gaseous distension of bowel in the left abdomen, both large and small bowel, favor focal ileus. No radiopaque foreign bodies. No organomegaly or free air. IMPRESSION: No metallic/radiopaque foreign body. Electronically Signed   By: Charlett Nose M.D.   On: 05/02/2019 16:27    DG Swallowing Func-Speech Pathology   Result Date: 05/03/2019 Objective Swallowing Evaluation: Type of Study: MBS-Modified Barium Swallow Study  Patient Details Name: BRAD LIEURANCE MRN: [id number] Date of Birth: [birth year] Today's Date: 05/03/2019 Time: SLP Start Time (ACUTE ONLY): 1040 -SLP Stop Time (ACUTE ONLY): 1100 SLP Time Calculation (min) (ACUTE ONLY): 20 min Past Medical History: Past Medical History: Diagnosis Date . Hyperlipidemia  . Hypertension  Past Surgical History: Past Surgical History: Procedure Laterality Date . ACHILLES TENDON SURGERY Right 11/10/2016  Procedure: ACHILLES TENDON REPAIR VIA SPEED BRIDGE;  Surgeon: [name], DPM;  Location: [facility];  Service: Podiatry;  Laterality: Right; . HEEL SPUR RESECTION Right 11/10/2016  Procedure: HEEL SPUR RESECTION (HAGLUNDS DEFORMITY);  Surgeon: [name], DPM;  Location: [facility];  Service: Podiatry;  Laterality: Right; . REPAIR OF  ACUTE ASCENDING THORACIC AORTIC DISSECTION N/A 05/01/2019  Procedure: REPAIR OF ACUTE ASCENDING THORACIC AORTIC DISSECTION USING HEMOSHIELD GRAFT SIZE ;  Surgeon: Linden Dolin, MD;  Location: [facility] OR;  Service: Cardiothoracic;  Laterality: N/A; HPI: QUINLAN VOLLMER is a 54 y.o. male with history of hypertension, hyperlipidemia.  Patient apparently was seen in the emergency department  on 05/01/2019 for right facial droop and inability to speak along with right arm weakness. Currently presnts with expressive aphasia. No h/o swallowing difficulties.  Subjective: alert, cooperative Assessment / Plan / Recommendation CHL IP CLINICAL IMPRESSIONS 05/03/2019 Clinical Impression Pt was seen in radiology suite for modified barium swallow study. Trials of puree solids, dysphagia 2 solids, regular texture solids, thin liquids, and nectar thick liquids via cup and straw were administered. Pt presents with moderate oropharyngeal dysphagia characterized by impaired bolus control, prolonged mastication, a pharyngeal delay, and reduced anterior laryngeal movement. Bolus manipulation was reduced during mastication and mastication was inadequately thorough. He demonstrated premature spillage to the level of valleculae and pyriform sinuses. The swallow was often triggered with the head of the bolus at the level of the pyriform sinuses with thin liquids and at the level of the valleculae with other consistencies. Aspiration (PAS 7) was noted with thin liquids after the swallow and was likely due to residue in the pyriform sinuses. However, this cannot be conclusively stated since the aspiration event occurred between swallows when the fluoro was off. Coughing was noted throughout the study even in the absence of laryngeal invasion but coughing was ineffective in expelling the aspirant. A puree diet with nectar thick liquids may be started at this time with strict observance of swallowing precautions. However, considering the level of  fatigue that he demonstrated and endorsed having at the end of the study, his endurance during meals is questioned and supplemental non-oral nutrition may therefore be needed. SLP will follow to assess diet tolerance and for treatment.  SLP Visit Diagnosis Dysphagia, unspecified (R13.10) Attention and concentration deficit following -- Frontal lobe and executive function deficit following -- Impact on safety and function Mild aspiration risk;Moderate aspiration risk   CHL IP TREATMENT RECOMMENDATION 05/03/2019 Treatment Recommendations Therapy as outlined in treatment plan below   Prognosis 05/03/2019 Prognosis for Safe Diet Advancement Good Barriers to Reach Goals Language deficits;Severity of deficits Barriers/Prognosis Comment -- CHL IP DIET RECOMMENDATION 05/03/2019 SLP Diet Recommendations Dysphagia 1 (Puree) solids;Nectar thick liquid Liquid Administration via Cup;Straw Medication Administration Crushed with puree Compensations Slow rate;Small sips/bites Postural Changes Remain semi-upright after after feeds/meals (Comment);Seated upright at 90 degrees   CHL IP OTHER RECOMMENDATIONS 05/03/2019 Recommended Consults -- Oral Care Recommendations Oral care BID;Oral care before and after PO Other Recommendations Have oral suction available   CHL IP FOLLOW UP RECOMMENDATIONS 05/03/2019 Follow up Recommendations (No Data)   CHL IP FREQUENCY AND DURATION 05/03/2019 Speech Therapy Frequency (ACUTE ONLY) min 2x/week Treatment Duration 2 weeks      CHL IP ORAL PHASE 05/03/2019 Oral Phase Impaired Oral - Pudding Teaspoon -- Oral - Pudding Cup -- Oral - Honey Teaspoon -- Oral - Honey Cup -- Oral - Nectar Teaspoon -- Oral - Nectar Cup Premature spillage;Decreased bolus cohesion;Delayed oral transit Oral - Nectar Straw Premature spillage;Decreased bolus cohesion;Delayed oral transit Oral - Thin Teaspoon -- Oral - Thin Cup Premature spillage;Decreased bolus cohesion;Delayed oral transit Oral - Thin Straw -- Oral - Puree Decreased bolus  cohesion;Delayed oral transit;Piecemeal swallowing Oral - Mech Soft Impaired mastication;Delayed oral transit;Weak lingual manipulation Oral - Regular Impaired mastication;Decreased bolus cohesion Oral - Multi-Consistency -- Oral - Pill -- Oral Phase - Comment --  CHL IP PHARYNGEAL PHASE 05/03/2019 Pharyngeal Phase Impaired Pharyngeal- Pudding Teaspoon -- Pharyngeal -- Pharyngeal- Pudding Cup -- Pharyngeal -- Pharyngeal- Honey Teaspoon -- Pharyngeal -- Pharyngeal- Honey Cup -- Pharyngeal -- Pharyngeal- Nectar Teaspoon -- Pharyngeal -- Pharyngeal- Nectar Cup -- Pharyngeal -- Pharyngeal-  Nectar Straw Delayed swallow initiation-vallecula;Pharyngeal residue - pyriform Pharyngeal -- Pharyngeal- Thin Teaspoon -- Pharyngeal -- Pharyngeal- Thin Cup Delayed swallow initiation-pyriform sinuses;Reduced anterior laryngeal mobility Pharyngeal -- Pharyngeal- Thin Straw Delayed swallow initiation-pyriform sinuses;Penetration/Apiration after swallow;Reduced anterior laryngeal mobility Pharyngeal Material enters airway, passes BELOW cords and not ejected out despite cough attempt by patient Pharyngeal- Puree Reduced anterior laryngeal mobility Pharyngeal -- Pharyngeal- Mechanical Soft Reduced anterior laryngeal mobility Pharyngeal -- Pharyngeal- Regular -- Pharyngeal -- Pharyngeal- Multi-consistency -- Pharyngeal -- Pharyngeal- Pill -- Pharyngeal -- Pharyngeal Comment --  CHL IP CERVICAL ESOPHAGEAL PHASE 05/03/2019 Cervical Esophageal Phase WFL Pudding Teaspoon -- Pudding Cup -- Honey Teaspoon -- Honey Cup -- Nectar Teaspoon -- Nectar Cup -- Nectar Straw -- Thin Teaspoon -- Thin Cup -- Thin Straw -- Puree -- Mechanical Soft -- Regular -- Multi-consistency -- Pill -- Cervical Esophageal Comment -- Shanika I. Vear Clock, MS, CCC-SLP Acute Rehabilitation Services Office number [phone] Pager [phone] Scheryl Marten 05/03/2019, 12:23 PM               ECHOCARDIOGRAM LIMITED   Result Date: 05/03/2019    ECHOCARDIOGRAM LIMITED  REPORT   Patient Name:   PRANISH AKHAVAN Date of Exam: 05/03/2019 Medical Rec #:  [id number]    Height:       72.0 in Accession #:    [id number]   Weight:       250.9 lb Date of Birth:  1966-01-08   BSA:          2.346 m Patient Age:    53 years     BP:           134/75 mmHg Patient Gender: M            HR:           83 bpm. Exam Location:  Inpatient Procedure: Limited Echo                      STAT ECHO Reported to: Dr Nicholes Mango on 05/03/2019 6:05:00 PM       Dr. Gala Romney reviewed echo at bedside. Indications:    Ventricular Tachycardia I47.2  History:        Patient has prior history of Echocardiogram examinations, most                 recent 05/01/2019. Risk Factors:Current Smoker. Aortic dissection.                 S/p ascending aortic replacement.  Sonographer:    Ross Ludwig RDCS (AE) Referring Phys: [id number] DANIEL R BENSIMHON  Sonographer Comments: No subcostal window. Drainage bandages in place subcostally. Large bandage over sternum. IMPRESSIONS  1. Prominent paradoxical septal motion and respiratory displacement of the intraventricular septum is seen. Left ventricular ejection fraction, by estimation, is 45 to 50%. The left ventricle has mildly decreased function. The left ventricle demonstrates global hypokinesis. There is mild concentric left ventricular hypertrophy. Left ventricular diastolic parameters were normal.  2. Right ventricular systolic function not well seen, probably mildly reduced. The right ventricular size is normal. Tricuspid regurgitation signal is inadequate for assessing PA pressure.  3. The mitral valve is normal in structure and function. No evidence of mitral valve regurgitation.  4. Native aortic valve preserved/repaired. The aortic valve is tricuspid. Aortic valve regurgitation is not visualized. Aortic valve mean gradient measures 1.7 mmHg.  5. 28 mm tube graft seen in the ascending aorta, with intact anastomoses. Residual dissection is not identified (on limited  aortic views). Aortic  root/ascending aorta has been repaired/replaced. FINDINGS  Left Ventricle: Prominent paradoxical septal motion and respiratory displacement of the intraventricular septum is seen. Left ventricular ejection fraction, by estimation, is 45 to 50%. The left ventricle has mildly decreased function. The left ventricle demonstrates global hypokinesis. The left ventricular internal cavity size was normal in size. There is mild concentric left ventricular hypertrophy. Abnormal (paradoxical) septal motion consistent with post-operative status. Indeterminate filling pressures. Right Ventricle: The right ventricular size is normal. Right ventricular systolic function not well seen, probably mildly reduced. Tricuspid regurgitation signal is inadequate for assessing PA pressure. Left Atrium: Left atrial size was normal in size. Right Atrium: Right atrial size was not well visualized. Pericardium: There is no evidence of pericardial effusion. Mitral Valve: The mitral valve is normal in structure and function. Tricuspid Valve: The tricuspid valve is not well visualized. Tricuspid valve regurgitation is not demonstrated. Aortic Valve: Native aortic valve preserved/repaired. The aortic valve is tricuspid. Aortic valve regurgitation is not visualized. Aortic valve mean gradient measures 1.7 mmHg. Aortic valve peak gradient measures 3.3 mmHg. Aortic valve area, by VTI measures 3.31 cm. Pulmonic Valve: The pulmonic valve was grossly normal. Pulmonic valve regurgitation is not visualized. Aorta: 28 mm tube graft seen in the ascending aorta, with intact anastomoses. Residual dissection is not identified (on limited aortic views). 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  LEFT VENTRICLE PLAX 2D LVIDd:         4.30 cm  Diastology LVIDs:         3.70 cm  LV e' lateral:   7.51 cm/s LV PW:         1.37 cm  LV E/e' lateral: 10.1 LV IVS:        1.34 cm  LV e' medial:    8.49 cm/s LVOT diam:     2.00 cm  LV E/e' medial:  8.9  LV SV:         47 LV SV Index:   20 LVOT Area:     3.14 cm  LEFT ATRIUM         Index LA diam:    3.00 cm 1.28 cm/m  AORTIC VALVE AV Area (Vmax):    3.19 cm AV Area (Vmean):   3.01 cm AV Area (VTI):     3.31 cm AV Vmax:           90.37 cm/s AV Vmean:          60.400 cm/s AV VTI:            0.141 m AV Peak Grad:      3.3 mmHg AV Mean Grad:      1.7 mmHg LVOT Vmax:         91.90 cm/s LVOT Vmean:        57.900 cm/s LVOT VTI:          0.149 m LVOT/AV VTI ratio: 1.05  AORTA Ao Root diam: 3.90 cm MITRAL VALVE MV Area (PHT): 3.53 cm    SHUNTS MV Decel Time: 215 msec    Systemic VTI:  0.15 m MV E velocity: 75.80 cm/s  Systemic Diam: 2.00 cm MV A velocity: 47.10 cm/s MV E/A ratio:  1.61 Mihai Croitoru MD Electronically signed by Sanda Klein MD Signature Date/Time: 05/03/2019/7:29:59 PM    Final        Assessment/Plan: 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   Stroke: Continue secondary stroke prophylaxis and Risk Factor Modification listed below:   Antiplatelet  therapy:   Blood Pressure Management:  Continue current medication with prn's with permisive HTN per primary team Statin Agent:   Tobacco abuse: Counseled Motor recovery: Fluoxetine Labs and imaging (see above) independently reviewed.  Records reviewed and summated above.   1. Does the need for close, 24 hr/day medical supervision in concert with the patient's rehab needs make it unreasonable for this patient to be served in a less intensive setting? Yes 2. Co-Morbidities requiring supervision/potential complications: HTN (monitor and provide prns in accordance with increased physical exertion and pain), hyperlipidemia, tobacco abuse (counsel), tachypnea (monitor RR and O2 Sats with increased physical exertion), leukocytosis (repeat labs, cont to monitor for signs and symptoms of infection, further workup if indicated) 3. Due to safety, disease management, medication administration and patient education, does the patient require 24 hr/day  rehab nursing? Yes 4. Does the patient require coordinated care of a physician, rehab nurse, therapy disciplines of PT/OT/SLP to address physical and functional deficits in the context of the above medical diagnosis(es)? Yes Addressing deficits in the following areas: balance, endurance, locomotion, strength, transferring, bathing, dressing, toileting, speech, language, swallowing and psychosocial support 5. Can the patient actively participate in an intensive therapy program of at least 3 hrs of therapy per day at least 5 days per week? Likely 6. The potential for patient to make measurable gains while on inpatient rehab is excellent 7. Anticipated functional outcomes upon discharge from inpatient rehab are TBD  with PT, TBD with OT, supervision with SLP. 8. Estimated rehab length of stay to reach the above functional goals is: TBD 9. Anticipated discharge destination: Home 10. Overall Rehab/Functional Prognosis: good   RECOMMENDATIONS: This patient's condition is appropriate for continued rehabilitative care in the following setting: We will need to await completion of therapy evaluation CIR once medically stable. Patient has agreed to participate in recommended program. Potentially Note that insurance prior authorization may be required for reimbursement for recommended care.   Comment: Rehab Admissions Coordinator to follow up.   I have personally performed a face to face diagnostic evaluation, including, but not limited to relevant history and physical exam findings, of this patient and developed relevant assessment and plan.  Additionally, I have reviewed and concur with the physician assistant's documentation above.    Maryla Morrow, MD, ABPMR Mcarthur Rossetti Angiulli, PA-C 05/04/2019        Revision History                     Routing History

## 2019-05-09 NOTE — Progress Notes (Signed)
Social Work Patient ID: Erik Parker, male   DOB: 1965/11/17, 54 y.o.   MRN: [id number]   SW met with pt at bedside with made efforts to complete assessment with pt. Pt unable to complete due to aphasia. SW to follow-up with pt wife [name] to complete.   Loralee Pacas, MSW, [facility] Office: [phone] Cell: [phone] Fax: [phone]

## 2019-05-09 NOTE — Progress Notes (Signed)
8 Days Post-Op Procedure(s) (LRB): REPAIR OF ACUTE ASCENDING THORACIC AORTIC DISSECTION USING HEMOSHIELD GRAFT SIZE (N/A) Subjective: Awake and alert, working on eating breakfast but communicates that his appetite is poor.  Objective: Vital signs in last 24 hours: Temp:  [97.9 F (36.6 C)-99 F (37.2 C)] 97.9 F (36.6 C) (03/10 0302) Pulse Rate:  [72-98] 98 (03/09 1909) Cardiac Rhythm: Normal sinus rhythm (03/10 0701) Resp:  [19-22] 22 (03/10 0302) BP: (102-126)/(67-78) 125/70 (03/10 0302) SpO2:  [98 %-100 %] 98 % (03/10 0302) Weight:  [105.9 kg] 105.9 kg (03/10 0629)     Intake/Output from previous day: 03/09 0701 - 03/10 0700 In: 60 [NG/GT:60] Out: 400 [Urine:400] Intake/Output this shift: No intake/output data recorded.  Physical Exam General appearance:alert, cooperative and no distress Neurologic: right facial droop not as evident today. Still has expressive dysphasia.  Heart:regular rate and rhythm Lungs:Breath sounds are clear. Abdomen:soft and non-tender Extremities:all warm and well perfused with palpable pulses Wound:the sternal incision is well approximated with a few areas of dry bloody drainage.  Lab Results: No results for input(s): WBC, HGB, HCT, PLT in the last 72 hours. BMET:  Recent Labs    05/08/19 0209  NA 141  K 4.4  CL 109  CO2 23  GLUCOSE 120*  BUN 16  CREATININE 0.65  CALCIUM 9.0    PT/INR: No results for input(s): LABPROT, INR in the last 72 hours. ABG    Component Value Date/Time   PHART 7.441 05/02/2019 0706   HCO3 23.1 05/02/2019 0706   TCO2 24 05/02/2019 0706   ACIDBASEDEF 1.0 05/02/2019 0409   O2SAT 96.0 05/02/2019 0706   CBG (last 3)  No results for input(s): GLUCAP in the last 72 hours.  Assessment/Plan: S/P Procedure(s) (LRB): REPAIR OF ACUTE ASCENDING THORACIC AORTIC DISSECTION USING HEMOSHIELD GRAFT SIZE (N/A)  -POD8 repair of Type A aortic dissection in a 54yo male presenting with an acute CVA.  PO. Ready for transfer to inpatient rehab from surgical standpoint. Awaiting insurance approval.  -Post-op VT on POD-2- appreciate evaluation by cardiology: "No evidence of ACS or ongoing ischemia". He has had no further arhythmias. Continuing metoprolol.  -Expected acute blood loss anemia- Hct stabilized. No evidence of ongoing losses.    LOS: 8 days    Leary Roca, [place] [phone] 05/09/2019

## 2019-05-09 NOTE — Progress Notes (Signed)
Inpatient Rehabilitation Admissions Coordinator  Insurance has approved CIR and I have bed available today. I contacted [place], [place] and we will make arrangements to admit today. RN CM, Gavin Pound and SW, [place], made aware.   Ottie Glazier, RN, MSN Rehab Admissions Coordinator [phone] 05/09/2019 9:23 AM

## 2019-05-09 NOTE — Plan of Care (Signed)
  Problem: Education: Goal: Knowledge of General Education information will improve Description: Including pain rating scale, medication(s)/side effects and non-pharmacologic comfort measures Outcome: Progressing   Problem: Health Behavior/Discharge Planning: Goal: Ability to manage health-related needs will improve Outcome: Progressing   Problem: Clinical Measurements: Goal: Ability to maintain clinical measurements within normal limits will improve Outcome: Progressing Goal: Will remain free from infection Outcome: Progressing Goal: Diagnostic test results will improve Outcome: Progressing Goal: Respiratory complications will improve Outcome: Progressing Goal: Cardiovascular complication will be avoided Outcome: Progressing   Problem: Activity: Goal: Risk for activity intolerance will decrease Outcome: Progressing   Problem: Nutrition: Goal: Adequate nutrition will be maintained Outcome: Progressing   Problem: Coping: Goal: Level of anxiety will decrease Outcome: Progressing   Problem: Elimination: Goal: Will not experience complications related to bowel motility Outcome: Progressing Goal: Will not experience complications related to urinary retention Outcome: Progressing   Problem: Pain Managment: Goal: General experience of comfort will improve Outcome: Progressing   Problem: Safety: Goal: Ability to remain free from injury will improve Outcome: Progressing   Problem: Skin Integrity: Goal: Risk for impaired skin integrity will decrease Outcome: Progressing   Problem: Education: Goal: Knowledge of disease or condition will improve Outcome: Progressing Goal: Knowledge of secondary prevention will improve Outcome: Progressing Goal: Knowledge of patient specific risk factors addressed and post discharge goals established will improve Outcome: Progressing   Problem: Coping: Goal: Will identify appropriate support needs Outcome: Progressing   Problem:  Health Behavior/Discharge Planning: Goal: Ability to manage health-related needs will improve Outcome: Progressing   Problem: Self-Care: Goal: Ability to participate in self-care as condition permits will improve Outcome: Progressing Goal: Verbalization of feelings and concerns over difficulty with self-care will improve Outcome: Progressing Goal: Ability to communicate needs accurately will improve Outcome: Progressing   Problem: Nutrition: Goal: Risk of aspiration will decrease Outcome: Progressing Goal: Dietary intake will improve Outcome: Progressing   Problem: Education: Goal: Will demonstrate proper wound care and an understanding of methods to prevent future damage Outcome: Progressing Goal: Knowledge of disease or condition will improve Outcome: Progressing Goal: Knowledge of the prescribed therapeutic regimen will improve Outcome: Progressing Goal: Individualized Educational Video(s) Outcome: Progressing   Problem: Activity: Goal: Risk for activity intolerance will decrease Outcome: Progressing   Problem: Cardiac: Goal: Will achieve and/or maintain hemodynamic stability Outcome: Progressing   Problem: Clinical Measurements: Goal: Postoperative complications will be avoided or minimized Outcome: Progressing   Problem: Respiratory: Goal: Respiratory status will improve Outcome: Progressing   Problem: Skin Integrity: Goal: Wound healing without signs and symptoms of infection Outcome: Progressing Goal: Risk for impaired skin integrity will decrease Outcome: Progressing   Problem: Urinary Elimination: Goal: Ability to achieve and maintain adequate renal perfusion and functioning will improve Outcome: Progressing   

## 2019-05-10 ENCOUNTER — Inpatient Hospital Stay (HOSPITAL_COMMUNITY): Payer: 59 | Admitting: Occupational Therapy

## 2019-05-10 ENCOUNTER — Inpatient Hospital Stay (HOSPITAL_COMMUNITY): Payer: 59

## 2019-05-10 ENCOUNTER — Inpatient Hospital Stay (HOSPITAL_COMMUNITY): Payer: 59 | Admitting: Speech Pathology

## 2019-05-10 DIAGNOSIS — R1312 Dysphagia, oropharyngeal phase: Secondary | ICD-10-CM

## 2019-05-10 DIAGNOSIS — I63412 Cerebral infarction due to embolism of left middle cerebral artery: Secondary | ICD-10-CM

## 2019-05-10 DIAGNOSIS — I69320 Aphasia following cerebral infarction: Secondary | ICD-10-CM

## 2019-05-10 DIAGNOSIS — I309 Acute pericarditis, unspecified: Secondary | ICD-10-CM

## 2019-05-10 DIAGNOSIS — R1310 Dysphagia, unspecified: Secondary | ICD-10-CM

## 2019-05-10 DIAGNOSIS — Z95828 Presence of other vascular implants and grafts: Secondary | ICD-10-CM

## 2019-05-10 DIAGNOSIS — I7101 Dissection of thoracic aorta: Secondary | ICD-10-CM

## 2019-05-10 LAB — COMPREHENSIVE METABOLIC PANEL
ALT: 51 U/L — ABNORMAL HIGH (ref 0–44)
AST: 24 U/L (ref 15–41)
Albumin: 2.9 g/dL — ABNORMAL LOW (ref 3.5–5.0)
Alkaline Phosphatase: 73 U/L (ref 38–126)
Anion gap: 9 (ref 5–15)
BUN: 15 mg/dL (ref 6–20)
CO2: 25 mmol/L (ref 22–32)
Calcium: 9.2 mg/dL (ref 8.9–10.3)
Chloride: 105 mmol/L (ref 98–111)
Creatinine, Ser: 0.65 mg/dL (ref 0.61–1.24)
GFR calc Af Amer: >60 mL/min (ref >60)
GFR calc non Af Amer: >60 mL/min (ref >60)
Glucose, Bld: 117 mg/dL — ABNORMAL HIGH (ref 70–99)
Potassium: 4.4 mmol/L (ref 3.5–5.1)
Sodium: 139 mmol/L (ref 135–145)
Total Bilirubin: 0.7 mg/dL (ref 0.3–1.2)
Total Protein: 6.5 g/dL (ref 6.5–8.1)

## 2019-05-10 LAB — CBC WITH DIFFERENTIAL/PLATELET
Abs Immature Granulocytes: 0.55 10*3/uL — ABNORMAL HIGH (ref 0.00–0.07)
Basophils Absolute: 0.1 10*3/uL (ref 0.0–0.1)
Basophils Relative: 1 %
Eosinophils Absolute: 0.1 10*3/uL (ref 0.0–0.5)
Eosinophils Relative: 1 %
HCT: 35.3 % — ABNORMAL LOW (ref 39.0–52.0)
Hemoglobin: 11.4 g/dL — ABNORMAL LOW (ref 13.0–17.0)
Immature Granulocytes: 5 %
Lymphocytes Relative: 25 %
Lymphs Abs: 3 10*3/uL (ref 0.7–4.0)
MCH: 28.1 pg (ref 26.0–34.0)
MCHC: 32.3 g/dL (ref 30.0–36.0)
MCV: 86.9 fL (ref 80.0–100.0)
Monocytes Absolute: 1 10*3/uL (ref 0.1–1.0)
Monocytes Relative: 9 %
Neutro Abs: 7.2 10*3/uL (ref 1.7–7.7)
Neutrophils Relative %: 59 %
Platelets: 481 10*3/uL — ABNORMAL HIGH (ref 150–400)
RBC: 4.06 MIL/uL — ABNORMAL LOW (ref 4.22–5.81)
RDW: 15 % (ref 11.5–15.5)
WBC: 11.9 10*3/uL — ABNORMAL HIGH (ref 4.0–10.5)
nRBC: 0 % (ref 0.0–0.2)

## 2019-05-10 MED ORDER — ACETAMINOPHEN 325 MG PO TABS
650.0000 mg | ORAL_TABLET | Freq: Four times a day (QID) | ORAL | Status: DC
  Administered 2019-05-10 – 2019-05-15 (×19): 650 mg via ORAL
  Filled 2019-05-10 (×18): qty 2

## 2019-05-10 NOTE — Evaluation (Signed)
Speech Language Pathology Assessment and Plan  Patient Details  Name: Erik Parker MRN: [id number] Date of Birth: 31-Aug-1965  SLP Diagnosis: Dysarthria;Apraxia;Dysphagia  Rehab Potential: Excellent ELOS:   5 to 7 days  Today's Date: 05/10/2019 SLP Individual Time: 8182-9937 SLP Individual Time Calculation (min): 70 min   Problem List:  Patient Active Problem List   Diagnosis Date Noted  . Aphasia as late effect of cerebrovascular accident (CVA) 05/10/2019  . Dysphagia 05/10/2019  . History of open heart surgery   . Pericarditis   . Acute CVA (cerebrovascular accident) ([place])   . Essential hypertension   . Dyslipidemia   . Tobacco abuse   . Tachypnea   . Leukocytosis   . Cerebral embolism with cerebral infarction 05/03/2019  . Aortic dissection ([place]) 05/01/2019  . 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 Bilateral hand numbness 02/26/2019  . Decreased grip strength 02/26/2019   Past Medical History:  Past Medical History:  Diagnosis Date  . Hyperlipidemia   . Hypertension    Past Surgical History:  Past Surgical History:  Procedure Laterality Date  . ACHILLES TENDON SURGERY Right 11/10/2016   Procedure: ACHILLES TENDON REPAIR VIA SPEED BRIDGE;  Surgeon: [name], DPM;  Location: [facility];  Service: Podiatry;  Laterality: Right;  . HEEL SPUR RESECTION Right 11/10/2016   Procedure: HEEL SPUR RESECTION (HAGLUNDS DEFORMITY);  Surgeon: [name], DPM;  Location: [facility];  Service: Podiatry;  Laterality: Right;  . REPAIR OF ACUTE ASCENDING THORACIC AORTIC DISSECTION N/A 05/01/2019   Procedure: REPAIR OF ACUTE ASCENDING THORACIC AORTIC DISSECTION USING HEMOSHIELD GRAFT SIZE 28MM;  Surgeon: Wonda Olds, MD;  Location: [facility];  Service: Cardiothoracic;  Laterality: N/A;    Assessment / Plan / Recommendation Clinical Impression Erik Parker is a 54 year old right-handed male history of hypertension, hyperlipidemia as well as tobacco abuse.  History  taken from chart review due to expressive aphasia.  Patient lives with spouse.  Independent prior to admission working full-time.  He presented on 05/01/2019 with right facial droop, chest pain, and right arm weakness. CT of the head showed left frontal operculum acute nonhemorrhagic infarction.  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 Orvan Seen.  Intraoperative echocardiogram with ejection fraction of 50%. Follow-up MRI of the brain showed scattered areas of acute infarction in both hemispheres, more extensive on the left than right, consistent with embolic infarctions.  The largest area of involvement 3.5-4 cm stroke in the left frontal operculum with mild swelling and minimal petechial blood products.  Scattered other strokes in the left hemisphere, next largest measuring 2 cm at the left parieto-occipital junction.  Single acute infarction in the right hemisphere measuring less than a centimeter in size in the right frontal lobe.  Acute postoperative pericarditis maintained on colchicine.  Follow-up cardiology services for episode of V. tach 3/4 of 23 beats stat echocardiogram ejection fraction 45 to 50% no regional wall motion abnormalities to suggest ACS.  Hospital course further complicated by post stroke dysphagia.  Dysphagia #1 nectar thick liquid diet as well as a nasogastric tube remained in place and awaiting plan for removal of tube.  Therapy evaluations completed and patient was admitted for a comprehensive rehab program on 05/09/19.   Comprehensive cognitive linguistic evaluation and bedside swallow evaluation completed on 05/10/19. Pt's receptive language skills and cognition are appropriate. Pt presents with moderate to  severe oral motor deficits c/b right facial flaccidity as well as lingual strength and coordination of articulators. As a  result, pt's speech intelligibility is greatly reduced to ~ 25% at the simple word level. Specifically, pt demonstrates imprecise lingual movements that affect anterior lingual movements for consonants as well as diphthongs. These imprecise movements are further complicated by [facility] planning deficits with perseveration of phonemes. Pt is aware of errors but is unable to change at this time. Pt was able to correctly name 20 basic objects as were as fluently convey ideas with no evidence of word finding deficits. He is able to supplement communicate with gestures and written responses. When utilizing written responses, pt was able to convey information but writting contained phonemic errors. He used printed letters to help him correctly spell words. At this time, pt doesn't present with expressive aphasia but his expressive deficits are related to dysarthria and apraxia.   Bedside Swallow evaluation was also completed. Pt had MBS on 05/03/19 with sensed aspiration thorughout study. During this BSE pt consumed thin liquids via spoon, cup and straw (specifically consumed 15 oz of via straw) with overt s/s of aspiration. Additionally, he consumed multiple dypshagia 3 items, mixed consistencies and his mediciation whole with thin liquids via straw. Pt didn't have any oral phase deficits and no overt s/s of aspiration. At this time, recommend upgrading pt's diet to dysphagia 3 with thin liquids via straw (to prevent right anterior spillage), medication whole with thin liquids and intermittent supervision with meals.     Skilled Therapeutic Interventions          [phone] -  SLP facilitated treatment by providing Mod A faded to Min A cues to increase correct spelling of words. Pt is aware that self-produced words were incorrectly spelled but he had difficulty correctly without SLP intervention.    SLP Assessment  Patient will need skilled Speech Lanaguage Pathology Services during CIR admission     Recommendations  SLP Diet Recommendations: Dysphagia 3 (Mech soft);Thin Liquid Administration via: Straw Medication Administration: Whole meds with liquid Supervision: Patient able to self feed Compensations: Slow rate;Small sips/bites Postural Changes and/or Swallow Maneuvers: Seated upright 90 degrees Oral Care Recommendations: Oral care BID Patient destination: Home Follow up Recommendations: Home Health SLP;Outpatient SLP Equipment Recommended: None recommended by SLP    SLP Frequency 3 to 5 out of 7 days   SLP Duration  SLP Intensity  SLP Treatment/Interventions  2 weeks  Minumum of 1-2 x/day, 30 to 90 minutes  Speech/Language facilitation;Dysphagia/aspiration precaution training;Patient/family education    Pain Pain Assessment Pain Scale: 0-10 Pain Score: 0-No pain(premedicated)  Prior Functioning Cognitive/Linguistic Baseline: Within functional limits Type of Home: House  Lives With: Spouse;Son Available Help at Discharge: Family;Available 24 hours/day Vocation: Full time employment  SLP Evaluation Cognition Overall Cognitive Status: Within Functional Limits for tasks assessed Arousal/Alertness: Awake/alert Orientation Level: Oriented X4  Comprehension Auditory Comprehension Overall Auditory Comprehension: Appears within functional limits for tasks assessed Visual Recognition/Discrimination Discrimination: Within Function Limits Reading Comprehension Reading Status: Within funtional limits Expression Expression Primary Mode of Expression: Verbal Verbal Expression Overall Verbal Expression: Impaired Initiation: No impairment Automatic Speech: Name;Social Response Level of Generative/Spontaneous Verbalization: Phrase Repetition: Impaired Level of Impairment: Word level Naming: No impairment Pragmatics: No impairment Interfering Components: Speech intelligibility Non-Verbal Means of Communication: Writing;Communication board;Gestures Written  Expression Dominant Hand: Right Written Expression: Exceptions to [facility] Self Formulation Ability: Word(phonemic errors) Oral Motor Oral Motor/Sensory Function Overall Oral Motor/Sensory Function: Moderate impairment Facial ROM: Reduced right Facial  Symmetry: Abnormal symmetry right Facial Strength: Reduced right Facial Sensation: Reduced right Lingual ROM: Reduced right Lingual Symmetry: Abnormal symmetry right Lingual Sensation: Within Functional Limits Velum: Within Functional Limits Mandible: Within Functional Limits Motor Speech Overall Motor Speech: Impaired Respiration: Within functional limits Phonation: Normal Resonance: Within functional limits Articulation: Impaired Level of Impairment: Word Intelligibility: Intelligibility reduced Word: 25-49% accurate Phrase: 0-24% accurate Sentence: 0-24% accurate Conversation: 0-24% accurate Motor Planning: Impaired Level of Impairment: Word Motor Speech Errors: Aware;Groping for words;Inconsistent Effective Techniques: Over-articulate   PMSV Assessment  PMSV Trial Intelligibility: Intelligibility reduced Word: 25-49% accurate Phrase: 0-24% accurate Sentence: 0-24% accurate Conversation: 0-24% accurate  Bedside Swallowing Assessment General Date of Onset: 05/01/19 Previous Swallow Assessment: BSE on 05/02/19 revealed signs of aspiration, MBS 05/03/19 - sensed aspiration of thin liquids, recommmend D1 with nectar thick liquids Diet Prior to this Study: Dysphagia 1 (puree);Nectar-thick liquids Temperature Spikes Noted: No Respiratory Status: Room air History of Recent Intubation: Yes Length of Intubations (days): 1 days Date extubated: 05/01/19 Behavior/Cognition: Alert;Cooperative;Pleasant mood Oral Cavity - Dentition: Adequate natural dentition Self-Feeding Abilities: Able to feed self Vision: Functional for self-feeding Patient Positioning: Upright in bed Baseline Vocal Quality: Normal Volitional Cough:  Strong Volitional Swallow: Able to elicit  Oral Care Assessment   Ice Chips Ice chips: Within functional limits Presentation: Self Fed;Spoon Thin Liquid Thin Liquid: Within functional limits Presentation: Self Fed;Cup;Spoon;Straw Nectar Thick Nectar Thick Liquid: Not tested Honey Thick Honey Thick Liquid: Not tested Puree Puree: Within functional limits Presentation: Self Fed;Spoon Solid Solid: Within functional limits Presentation: Self Fed Other Comments: mixed consistency fruit cup and dry graham crackers BSE Assessment Risk for Aspiration Impact on safety and function: Mild aspiration risk  Short Term Goals: Week 1: SLP Short Term Goal 1 (Week 1): Pt will consume trials of regular diet with minimal s/s of dysphagia/aspiration over 3 sessions prior to diet upgrade. SLP Short Term Goal 2 (Week 1): Pt will utilize speech intelligibility strategies to produce simple consonant vowel combinations in 6 out fo 10 opportunities with Mod A cues. SLP Short Term Goal 3 (Week 1): Pt will write responses to verbal questions in 7 out of 10 opportunities without phonemic errors and Min A cues.  Refer to Care Plan for Long Term Goals  Recommendations for other services: None   Discharge Criteria: Patient will be discharged from SLP if patient refuses treatment 3 consecutive times without medical reason, if treatment goals not met, if there is a change in medical status, if patient makes no progress towards goals or if patient is discharged from hospital.  The above assessment, treatment plan, treatment alternatives and goals were discussed and mutually agreed upon: by patient  Maryhelen Lindler 05/10/2019, 11:56 AM

## 2019-05-10 NOTE — Progress Notes (Signed)
Patient reporting intermittent worsening chest pain to his sternal incision throughout night with increased work of breathing/diaphoresis. Lasts a few minutes and resolves on its own. Patient states it is not new but gets worse at times. Assessed each time, vitals stable, PRN pain medications given.

## 2019-05-10 NOTE — Progress Notes (Signed)
Inpatient Rehabilitation  Patient information reviewed and entered into eRehab system by Lora Chavers M. Ysabela Keisler, M.A., CCC/SLP, PPS Coordinator.  Information including medical coding, functional ability and quality indicators will be reviewed and updated through discharge.    

## 2019-05-10 NOTE — Progress Notes (Signed)
[facility] PHYSICAL MEDICINE & REHABILITATION PROGRESS NOTE   Subjective/Complaints:  Pt reports having a little pain in chest right now, but last night so bad was "puffing" and breathing shallow per pt- by signing and writing his concerns.  Per pt, bowels working and going regularly.   Would like to schedule tylenol- and wants Cortrak out as soon as possible.    ROS: denies SOB, N/V/C/D, abd pain   Objective:   No results found. Recent Labs    05/10/19 0612  WBC 11.9*  HGB 11.4*  HCT 35.3*  PLT 481*   Recent Labs    05/08/19 0209 05/10/19 0612  NA 141 139  K 4.4 4.4  CL 109 105  CO2 23 25  GLUCOSE 120* 117*  BUN 16 15  CREATININE 0.65 0.65  CALCIUM 9.0 9.2    Intake/Output Summary (Last 24 hours) at 05/10/2019 1027 Last data filed at 05/10/2019 0700 Gross per 24 hour  Intake 414 ml  Output 875 ml  Net -461 ml     Physical Exam: Vital Signs Blood pressure 115/73, pulse 70, temperature 97.8 F (36.6 C), resp. rate 18, height 6' (1.829 m), weight 110.1 kg, SpO2 98 %.  Physical Exam  Vitals reviewed. Constitutional: laying in bed; cortrak in place, appropriate, NAD  Obese.  HENT:  Head:Marland Kitchen Cortrak in L nare; conjugate gaze Respiratory: CTA B/L; slightly decreased at bases; good air movement; off O2 CV: RRR- no JVD seen GI: soft, NT, ND; (+)BS hypoactive  Musculoskeletal:     Comments: No edema or tenderness in extremities  Neurological: He is alert.  Patient is alert in no acute distress.   Makes eye contact with examiner.   Motor: 4+/5 throughout Complete expressive aphasia- can't say 1 word, but able to WRITE down his concerns and sign universal signs.  Tries to speak, but although vocal, not verbal Skin: Skin is warm and dry.  Chest incision signs of betadine- healing- sore around incision.  Psychiatric: appropriate, calm   Assessment/Plan: 1. Functional deficits secondary to R hemiparesis and aphasia and dysphagia due to  left opercular  infarction, and aortic dissection which require 3+ hours per day of interdisciplinary therapy in a comprehensive inpatient rehab setting.  Physiatrist is providing close team supervision and 24 hour management of active medical problems listed below.  Physiatrist and rehab team continue to assess barriers to discharge/monitor patient progress toward functional and medical goals  Care Tool:  Bathing              Bathing assist       Upper Body Dressing/Undressing Upper body dressing        Upper body assist      Lower Body Dressing/Undressing Lower body dressing            Lower body assist       Toileting Toileting    Toileting assist Assist for toileting: Independent     Transfers Chair/bed transfer  Transfers assist     Chair/bed transfer assist level: Supervision/Verbal cueing     Locomotion Ambulation   Ambulation assist              Walk 10 feet activity   Assist           Walk 50 feet activity   Assist           Walk 150 feet activity   Assist           Walk 10 feet on uneven surface  activity   Assist           Wheelchair     Assist               Wheelchair 50 feet with 2 turns activity    Assist            Wheelchair 150 feet activity     Assist          Blood pressure 115/73, pulse 70, temperature 97.8 F (36.6 C), resp. rate 18, height 6' (1.829 m), weight 110.1 kg, SpO2 98 %.   Medical Problem List and Plan: 1.  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             Admit to CIR 2.  Antithrombotics: -DVT/anticoagulation: SCDs. Walking a lot.               -antiplatelet therapy: Aspirin 325 mg daily 3. Pain Management: Tramadol as needed  3/11- scheduled tylenol for pain since hurting a lot.  4. Mood: Provide emotional support             -antipsychotic agents: N/A 5. Neuropsych: This patient is capable of making decisions on his own behalf. 6. Skin/Wound Care: Routine skin checks 7. Fluids/Electrolytes/Nutrition: Routine in and outs.  CMP ordered. 8.  Post stroke dysphagia.  Dysphagia #1 nectar thick liquids.  Follow-up speech therapy  3/11- pt asking about getting Cortrak out- explained need OK from SLP- he understands.  9.  Hypertension/V. tach.  Lopressor 25 mg twice daily.  3/11- well controlled- con't meds    Follow-up cardiology services             Monitor with increased mobility 10.  Postoperative pericarditis.  Continue colchicine 11.  Tobacco abuse.  Counsel  3/11- pt reports doesn't plan on smoking again- will con't counseling.  12.  Hyperlipidemia.  Crestor    LOS: 1 days A FACE TO FACE EVALUATION WAS PERFORMED  Alyzabeth Pontillo 05/10/2019, 10:27 AM

## 2019-05-10 NOTE — Progress Notes (Addendum)
Social Work Patient ID: Erik Parker, male   DOB: 06-15-1965, 54 y.o.   MRN: [id number]   SW called pt s/o [name] [phone]) to complete assessment, however, voicemail full. SW will continue to make efforts.   Per medical team, pt is doing well and can d/c on Tuesday (3/16), and need Outpatient PT/OT/ST and BSC.   2nd attempt to call pt s/o [name]. No answer and unable to leave message.   Cecile Sheerer, MSW, LCSWA Office: [phone] Cell: [phone] Fax: [phone]

## 2019-05-10 NOTE — Evaluation (Signed)
Occupational Therapy Assessment and Plan  Patient Details  Name: Erik Parker MRN: [id number] Date of Birth: [birth year]  OT Diagnosis: hemiplegia affecting dominant side and muscle weakness (generalized) Rehab Potential: Rehab Potential (ACUTE ONLY): Excellent ELOS: 4-6 days   Today's Date: 05/10/2019 OT Individual Time: 1300-1420 OT Individual Time Calculation (min): 80 min     Problem List:  Patient Active Problem List   Diagnosis Date Noted  . Aphasia as late effect of cerebrovascular accident (CVA) 05/10/2019  . Dysphagia 05/10/2019  . History of open heart surgery   . Pericarditis   . Acute CVA (cerebrovascular accident) ([place])   . Essential hypertension   . Dyslipidemia   . Tobacco abuse   . Tachypnea   . Leukocytosis   . Cerebral embolism with cerebral infarction 05/03/2019  . Aortic dissection ([place]) 05/01/2019  . 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 Bilateral hand numbness 02/26/2019  . Decreased grip strength 02/26/2019    Past Medical History:  Past Medical History:  Diagnosis Date  . Hyperlipidemia   . Hypertension    Past Surgical History:  Past Surgical History:  Procedure Laterality Date  . ACHILLES TENDON SURGERY Right 11/10/2016   Procedure: ACHILLES TENDON REPAIR VIA SPEED BRIDGE;  Surgeon: [name], DPM;  Location: [facility];  Service: Podiatry;  Laterality: Right;  . HEEL SPUR RESECTION Right 11/10/2016   Procedure: HEEL SPUR RESECTION (HAGLUNDS DEFORMITY);  Surgeon: [name], DPM;  Location: [facility];  Service: Podiatry;  Laterality: Right;  . REPAIR OF ACUTE ASCENDING THORACIC AORTIC DISSECTION N/A 05/01/2019   Procedure: REPAIR OF ACUTE ASCENDING THORACIC AORTIC DISSECTION USING HEMOSHIELD GRAFT SIZE 28MM;  Surgeon: Wonda Olds, MD;  Location: [facility] OR;  Service: Cardiothoracic;  Laterality: N/A;    Assessment & Plan Clinical Impression: Patient is a 54 y.o. year old male with history of hypertension, hyperlipidemia as  well as tobacco abuse.  History taken from chart review due to expressive aphasia.  Patient lives with spouse.  Independent prior to admission working full-time.  1 level home 5 steps to entry.  Wife works 4 hours a day.  He presented on 05/01/2019 with right facial droop, chest pain, and right arm weakness. Troponin negative, BNP within normal limits 31, WBC 15,600, chemistries unremarkable.  CT of the head showed left frontal operculum acute nonhemorrhagic infarction.  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  No superimposed intracranial emergent large vessel occlusion.  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 Orvan Seen.  Intraoperative echocardiogram with ejection fraction of 50%. Follow-up MRI of the brain showed scattered areas of acute infarction in both hemispheres, more extensive on the left than right, consistent with embolic infarctions.  The largest area of involvement 3.5-4 cm stroke in the left frontal operculum with mild swelling and minimal petechial blood products.  Scattered other strokes in the left hemisphere, next largest measuring 2 cm at the left parieto-occipital junction.  Single acute infarction in the right hemisphere measuring less than a centimeter in size in the right frontal lobe.  Maintained on aspirin 325 mg daily for CVA prophylaxis.  Acute postoperative pericarditis maintained on colchicine.  Follow-up cardiology services for episode of V. tach 3/4 of 23 beats stat echocardiogram ejection fraction 45 to 50% no regional wall motion abnormalities to suggest ACS.  Patient did remain on metoprolol 25 mg twice daily.  Hospital course further complicated by post stroke dysphagia.  Dysphagia #1 nectar thick liquid diet as well as a nasogastric tube remained in place and awaiting plan for removal of tube.  Patient transferred to CIR on 05/09/2019 .    Patient currently  requires min with basic self-care skills secondary to muscle weakness, decreased cardiorespiratoy endurance, decreased coordination and decreased standing balance.  Prior to hospitalization, patient could complete adl/work with independent .  Patient will benefit from skilled intervention to decrease level of assist with basic self-care skills and increase independence with basic self-care skills prior to discharge home with care partner.  Anticipate patient will require intermittent supervision and follow up outpatient.  OT - End of Session Activity Tolerance: Tolerates 30+ min activity with multiple rests Endurance Deficit: Yes Endurance Deficit Description: mild fatigue with adl tasks OT Assessment Rehab Potential (ACUTE ONLY): Excellent OT Patient demonstrates impairments in the following area(s): Balance;Endurance;Motor OT Basic ADL's Functional Problem(s): Grooming;Bathing;Dressing;Toileting OT Transfers Functional Problem(s): Toilet;Tub/Shower OT Plan OT Intensity: Minimum of 1-2 x/day, 45 to 90 minutes OT Frequency: 5 out of 7 days OT Duration/Estimated Length of Stay: 4-6 days OT Treatment/Interventions: Balance/vestibular training;Disease mangement/prevention;Self Care/advanced ADL retraining;DME/adaptive equipment instruction;Skin care/wound managment;Community reintegration;Patient/family education;UE/LE Coordination activities;Discharge planning;Functional mobility training;Therapeutic Activities OT Self Feeding Anticipated Outcome(s): independent OT Basic Self-Care Anticipated Outcome(s): mod I OT Toileting Anticipated Outcome(s): mod I OT Bathroom Transfers Anticipated Outcome(s): mod I OT Recommendation Patient destination: Home Follow Up Recommendations: Outpatient OT Equipment Recommended: 3 in 1 bedside comode   Skilled Therapeutic Intervention Patient seated in w/c, alert and ready for therapy session.  He presents with appropriate affect, pleasant and cooperative t/o  session.  Expressive impairment noted but he is able to express thoughts/wants/needs through gestures, limited vocalization and writing/letter board.  He is able to demonstrate strong ability to understand and follow instructions provided.  He denies pain at this time.  Evaluation completed as documented below.  He has right coordination deficit, new sternal precautions and endurance impairment limiting independence and safety with self care tasks and mobility.  He will benefit from short rehab stay to endure safety with ADL and OP therapy for facilitation of return to iadl and work environments.  Reviewed role of OT, sternal precautions, evaluation process, DME, areas of deficit, plan of care and goals for therapy.  Patient participated in adl training, functional mobility, transfers and functional task completion both seated and in stance requiring instruction for sternal precautions, education related to energy conservation and min A at times.  Overall mobility completed with CS/CGA.  Patient returned to bed at close of session, bed alarm set and callbell/tray table in reach.    OT Evaluation Precautions/Restrictions  Precautions Precautions: Sternal;Fall Precaution Comments: patient able to demonstrate understanding of sternal precautions, requires occ cues during functional task completion Restrictions Weight Bearing Restrictions: Yes Other Position/Activity Restrictions: sternal precautions General   Vital Signs   Pain Pain Assessment Pain Scale: 0-10 Pain Score: 3  Pain Type: Surgical pain Pain Location: Incision Pain Orientation: Anterior;Medial Pain Descriptors / Indicators: Aching Pain Frequency: Occasional Pain Onset: Gradual Patients Stated Pain Goal: 3 Pain Intervention(s): Medication (See eMAR) Home Living/Prior [place] expects to be discharged to:: Private residence Living Arrangements: Spouse/significant other Available Help at Discharge:  Family, Available 24 hours/day Type of Home: House Home Access: Stairs to enter Technical brewer of Steps: 5 Entrance Stairs-Rails: Can reach both Home Layout: One level Bathroom Shower/Tub: Multimedia programmer: Standard Bathroom Accessibility: Yes  Lives With: Spouse, Son Prior Function Vocation: Full time employment Comments:  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, independent and driving ADL ADL Eating: Set up Where Assessed-Eating: Wheelchair Grooming: Supervision/safety Where Assessed-Grooming: Standing at sink Upper Body Bathing: Setup, Supervision/safety Where Assessed-Upper Body Bathing: Chair Lower Body Bathing: Supervision/safety, Setup Where Assessed-Lower Body Bathing: Chair Upper Body Dressing: Minimal assistance Where Assessed-Upper Body Dressing: Chair Lower Body Dressing: Supervision/safety, Setup Where Assessed-Lower Body Dressing: Chair Toileting: Supervision/safety Where Assessed-Toileting: Glass blower/designer: Close supervision Toilet Transfer Method: Counselling psychologist: Raised toilet seat ADL Comments: not cleared to shower, cues for sternal precautions Vision Baseline Vision/History: Wears glasses Wears Glasses: Reading only Patient Visual Report: Other (comment)(patient notes increased sensitivity to bright light) Vision Assessment?: Yes Eye Alignment: Within Functional Limits Ocular Range of Motion: Within Functional Limits Alignment/Gaze Preference: Within Defined Limits Tracking/Visual Pursuits: Decreased smoothness of horizontal tracking Saccades: Within functional limits Convergence: Within functional limits Visual Fields: No apparent deficits Perception  Perception: Within Functional Limits Praxis Praxis: Intact Cognition Overall Cognitive Status: Within Functional Limits for tasks assessed Arousal/Alertness: Awake/alert Orientation Level:  Person;Place;Situation Person: Oriented Place: Oriented Situation: Oriented Year: 2021 Month: March Day of Week: Correct Memory: Appears intact Immediate Memory Recall: Sock;Blue;Bed Memory Recall Sock: Without Cue Memory Recall Blue: Without Cue Memory Recall Bed: Without Cue Attention: Focused;Sustained Focused Attention: Appears intact Sustained Attention: Appears intact Awareness: Appears intact Problem Solving: Appears intact Safety/Judgment: Appears intact Sensation Sensation Proprioception: Appears Intact Coordination Gross Motor Movements are Fluid and Coordinated: Yes Fine Motor Movements are Fluid and Coordinated: No(mild right side deficit, able to write and use functionally) Finger Nose Finger Test: [facility] 9 Hole Peg Test: L = 25 sec, R = 41 sec...Marland KitchenMarland KitchenMarland Kitchen   box and blocks    L = 62, R = 52 Motor  Motor Motor: Hemiplegia Motor - Skilled Clinical Observations: mild right side motor impairment Mobility  Bed Mobility Bed Mobility: Supine to Sit;Sit to Supine Supine to Sit: Supervision/Verbal cueing Sit to Supine: Supervision/Verbal cueing Transfers Sit to Stand: Supervision/Verbal cueing Stand to Sit: Supervision/Verbal cueing  Trunk/Postural Assessment  Cervical Assessment Cervical Assessment: Within Functional Limits Thoracic Assessment Thoracic Assessment: Within Functional Limits Lumbar Assessment Lumbar Assessment: Within Functional Limits Postural Control Postural Control: Within Functional Limits  Balance Balance Balance Assessed: Yes Static Sitting Balance Static Sitting - Level of Assistance: 7: Independent Dynamic Sitting Balance Dynamic Sitting - Level of Assistance: 5: Stand by assistance Static Standing Balance Static Standing - Level of Assistance: 5: Stand by assistance Dynamic Standing Balance Dynamic Standing - Level of Assistance: 4: Min assist Extremity/Trunk Assessment RUE Assessment General Strength Comments: AROM intact to 90 degrees  proximal, WNL distal, no resistance due to sternal precautions, hx of carpal tunnel LUE Assessment General Strength Comments: AROM intact to 90 degrees proximal, WNL distal, no resistance due to sternal precautions, hx of carpal tunnel     Refer to Care Plan for Long Term Goals  Recommendations for other services: None    Discharge Criteria: Patient will be discharged from OT if patient refuses treatment 3 consecutive times without medical reason, if treatment goals not met, if there is a change in medical status, if patient makes no progress towards goals or if patient is discharged from hospital.  The above assessment, treatment plan, treatment alternatives and goals were discussed and mutually agreed upon: by patient  Carlos Levering 05/10/2019, 4:14 PM

## 2019-05-10 NOTE — Evaluation (Addendum)
Physical Therapy Assessment and Plan  Patient Details  Name: Erik Parker MRN: [id number] Date of Birth: 03-Dec-1965  PT Diagnosis: Abnormal posture, Abnormality of gait, Hemiplegia dominant, Muscle weakness and Pain in sternum Rehab Potential: Excellent ELOS: 4-6 days   Today's Date: 05/10/2019 PT Individual Time: 1031-5945 PT Individual Time Calculation (min): 65 min    Problem List:  Patient Active Problem List   Diagnosis Date Noted  . Aphasia as late effect of cerebrovascular accident (CVA) 05/10/2019  . Dysphagia 05/10/2019  . History of open heart surgery   . Pericarditis   . Acute CVA (cerebrovascular accident) ([place])   . Essential hypertension   . Dyslipidemia   . Tobacco abuse   . Tachypnea   . Leukocytosis   . Cerebral embolism with cerebral infarction 05/03/2019  . Aortic dissection ([place]) 05/01/2019  . 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 Bilateral hand numbness 02/26/2019  . Decreased grip strength 02/26/2019    Past Medical History:  Past Medical History:  Diagnosis Date  . Hyperlipidemia   . Hypertension    Past Surgical History:  Past Surgical History:  Procedure Laterality Date  . ACHILLES TENDON SURGERY Right 11/10/2016   Procedure: ACHILLES TENDON REPAIR VIA SPEED BRIDGE;  Surgeon: [name], DPM;  Location: [facility];  Service: Podiatry;  Laterality: Right;  . HEEL SPUR RESECTION Right 11/10/2016   Procedure: HEEL SPUR RESECTION (HAGLUNDS DEFORMITY);  Surgeon: [name], DPM;  Location: [facility];  Service: Podiatry;  Laterality: Right;  . REPAIR OF ACUTE ASCENDING THORACIC AORTIC DISSECTION N/A 05/01/2019   Procedure: REPAIR OF ACUTE ASCENDING THORACIC AORTIC DISSECTION USING HEMOSHIELD GRAFT SIZE 28MM;  Surgeon: Wonda Olds, MD;  Location: [facility] OR;  Service: Cardiothoracic;  Laterality: N/A;    Assessment & Plan Clinical Impression: Patient is a 54 y.o. year old right-handed male history of hypertension, hyperlipidemia as  well as tobacco abuse.  History taken from chart review due to expressive aphasia.  Patient lives with spouse.  Independent prior to admission working full-time.  1 level home 5 steps to entry.  Wife works 4 hours a day.  He presented on 05/01/2019 with right facial droop, chest pain, and right arm weakness. Troponin negative, BNP within normal limits 31, WBC 15,600, chemistries unremarkable.  CT of the head showed left frontal operculum acute nonhemorrhagic infarction.  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  No superimposed intracranial emergent large vessel occlusion.  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 Orvan Seen.  Intraoperative echocardiogram with ejection fraction of 50%. Follow-up MRI of the brain showed scattered areas of acute infarction in both hemispheres, more extensive on the left than right, consistent with embolic infarctions.  The largest area of involvement 3.5-4 cm stroke in the left frontal operculum with mild swelling and minimal petechial blood products.  Scattered other strokes in the left hemisphere, next largest measuring 2 cm at the left parieto-occipital junction.  Single acute infarction in the right hemisphere measuring less than a centimeter in size in the right frontal lobe.  Maintained on aspirin 325 mg daily for CVA prophylaxis.  Acute postoperative pericarditis maintained on colchicine.  Follow-up cardiology services for episode of V. tach 3/4 of 23 beats stat echocardiogram ejection fraction 45 to 50% no regional wall motion abnormalities to suggest ACS.  Patient did remain on metoprolol 25 mg twice daily.  Hospital course further complicated by post stroke dysphagia.  Dysphagia #1 nectar thick liquid diet as well as a nasogastric tube remained in place and awaiting plan for removal of tube.  Therapy evaluations completed and patient was admitted for a  comprehensive rehab program. Patient transferred to CIR on 05/09/2019 .   Patient currently requires supervision with mobility secondary to muscle weakness, decreased cardiorespiratoy endurance, impaired timing and sequencing and decreased coordination and decreased sitting balance, decreased standing balance, decreased postural control, hemiplegia, decreased balance strategies and difficulty maintaining precautions.  Prior to hospitalization, patient was independent  with mobility and lived with Spouse, Son in a House home.  Home access is 4-5Stairs to enter.  Patient will benefit from skilled PT intervention to maximize safe functional mobility, minimize fall risk and decrease caregiver burden for planned discharge home with intermittent assist.  Anticipate patient will benefit from follow up OP at discharge.  PT - End of Session Activity Tolerance: Tolerates 30+ min activity with multiple rests Endurance Deficit: Yes Endurance Deficit Description: mild fatigue with ambulating long distances, RPE 3/10 PT Assessment Rehab Potential (ACUTE/IP ONLY): Excellent PT Barriers to Discharge: Behavior PT Barriers to Discharge Comments: requires cues to maintain sternal precautions, has 24/7 supervision available with family PT Patient demonstrates impairments in the following area(s): Balance;Behavior;Edema;Endurance;Motor;Nutrition;Pain;Perception;Safety;Sensory;Skin Integrity PT Transfers Functional Problem(s): Bed Mobility;Bed to Chair;Car;Furniture;Floor PT Locomotion Functional Problem(s): Ambulation;Wheelchair Mobility;Stairs PT Plan PT Intensity: Minimum of 1-2 x/day ,45 to 90 minutes PT Frequency: 5 out of 7 days PT Duration Estimated Length of Stay: 4-6 days PT Treatment/Interventions: Ambulation/gait training;Discharge planning;Functional mobility training;Psychosocial support;Therapeutic Activities;Balance/vestibular training;Disease management/prevention;Neuromuscular re-education;Skin  care/wound management;Therapeutic Exercise;Cognitive remediation/compensation;DME/adaptive equipment instruction;Pain management;Splinting/orthotics;UE/LE Strength taining/ROM;Community reintegration;Functional electrical stimulation;Patient/family education;Stair training;UE/LE Coordination activities PT Transfers Anticipated Outcome(s): Independent PT Locomotion Anticipated Outcome(s): Independent >1000 ft PT Recommendation Follow Up Recommendations: Outpatient PT Patient destination: Home Equipment Recommended: None recommended by PT  Skilled Therapeutic Intervention In addition to the PT evaluation below, the patient performed the following skilled PT interventions: Patient in bed upon PT arrival. Patient alert and agreeable to PT session. Patient denied pain during session.  Therapeutic Activity: Bed Mobility: Patient performed rolling R/L and supine to/from sit with supervision for cues to maintain sternal precautions.  Transfers: Patient performed sit to/from stand x5 and a toilet transfer with supervision holding his heart pillow to his chest to maintain sternal precautions without cues. Provided verbal cues for patient to stand without rocking, as patient is strong enough to stand without use of UE without need of momentum. Patient was continent of bowl and independent with peri-care during toileting.  Five times Sit to Stand Test (FTSS) Method: Use a straight back chair with a solid seat that is 16-18" high. Ask participant to sit on the chair with arms folded across their chest.   Instructions: "Stand up and sit down as quickly as possible 5 times, keeping your arms folded across your chest."   Measurement: Stop timing when the participant stands the 5th time.  TIME: _13.2_____ (in seconds)  Times > 13.6 seconds is associated with increased disability and morbidity (Guralnik, 2000) Times > 15 seconds is predictive of recurrent falls in healthy individuals aged 55 and older  (Buatois, et al., 2008) Normal performance values in community dwelling individuals aged 91 and older (Bohannon, 2006): o 60-69 years: 11.4 seconds o 70-79 years: 12.6 seconds o 80-89 years: 14.8 seconds  MCID: ? 2.3 seconds for Vestibular Disorders (Meretta, 2006)   Gait Training:  Patient ambulated >200 feet x3 without an AD with supervision. Ambulated as described below. Provided verbal  cues for increased gait speed for improved propulsion and balance. Patient ambulated up/down a ramp, over 10 feet of mulch (unlevel surface), and up/down a curb to simulate community ambulation over unlevel surfaces with supervision-CGA without an AD.   Wheelchair Mobility:  Patient was transported in the w/c with total A throughout session for energy conservation, maintaining sternal precautions, and time management.  Patient in w/c in room at end of session with breaks locked, seat belt alarm set, and all needs within reach. Assisted patient with ordering meals with dietary aid. Required increased time for word finding and speech production. Instructed patient on utilizing a number system when unable to find the word. Spoke correct words ~50% of the time.   Instructed pt in results of PT evaluation as detailed below, PT POC, rehab potential, rehab goals, and discharge recommendations. Additionally discussed CIR's policies regarding fall safety and use of chair alarm and/or quick release belt. Pt verbalized understanding and in agreement. Will update pt's family members as they become available.    PT Evaluation Precautions/Restrictions Precautions Precautions: Sternal;Fall Precaution Comments: patient able to demonstrate understanding of sternal precautions, requires occ cues during functional task completion Restrictions Weight Bearing Restrictions: Yes Other Position/Activity Restrictions: sternal precautions Home Living/Prior Functioning Home Living Available Help at Discharge: Family;Available  24 hours/day Type of Home: House Home Access: Stairs to enter [employer] of Steps: 4-5 Entrance Stairs-Rails: Can reach both Home Layout: One level Bathroom Shower/Tub: Multimedia programmer: Standard Bathroom Accessibility: Yes  Lives With: Spouse;Son Prior Function Level of Independence: Independent with basic ADLs;Independent with gait;Independent with transfers;Independent with homemaking with ambulation  Able to Take Stairs?: Reciprically Driving: Yes Vocation: Full time employment Comments: 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, independent and driving Vision/Perception  Vision - Assessment Eye Alignment: Within Functional Limits Ocular Range of Motion: Within Functional Limits Alignment/Gaze Preference: Within Defined Limits Tracking/Visual Pursuits: Able to track stimulus in all quads without difficulty Saccades: Within functional limits Convergence: Within functional limits Perception Perception: Within Functional Limits Praxis Praxis: Intact  Cognition Overall Cognitive Status: Within Functional Limits for tasks assessed Arousal/Alertness: Awake/alert Orientation Level: Oriented X4 Attention: Focused;Sustained Focused Attention: Appears intact Sustained Attention: Appears intact Memory: Appears intact Awareness: Appears intact Problem Solving: Appears intact Safety/Judgment: Appears intact Sensation Sensation Light Touch: Impaired by gross assessment Peripheral sensation comments: 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 2-4 Light Touch Impaired Details: Impaired RUE;Impaired LUE Proprioception: Appears Intact Coordination Gross Motor Movements are Fluid and Coordinated: Yes Fine Motor Movements are Fluid and Coordinated: No Finger Nose Finger Test: WFL Heel Shin Test: WFL bilaterally Motor  Motor Motor: Hemiplegia Motor - Skilled Clinical Observations: mild right UE motor impairment   Mobility Bed Mobility Bed Mobility: Supine to Sit;Sit to Supine;Rolling Right;Rolling Left Rolling Right: Supervision/verbal cueing Rolling Left: Supervision/Verbal cueing Supine to Sit: Supervision/Verbal cueing Sit to Supine: Supervision/Verbal cueing Transfers Transfers: Sit to Stand;Stand to Sit;Stand Pivot Transfers Sit to Stand: Supervision/Verbal cueing Stand to Sit: Supervision/Verbal cueing Stand Pivot Transfers: Supervision/Verbal cueing Stand Pivot Transfer Details: Verbal cues for precautions/safety Stand Pivot Transfer Details (indicate cue type and reason): cues for sternal precautions Transfer (Assistive device): None Locomotion  Gait Ambulation: Yes Gait Assistance: Supervision/Verbal cueing Gait Distance (Feet): 200 Feet Assistive device: None Gait Gait: Yes Gait Pattern: Decreased stride length;Decreased trunk rotation Gait velocity: decreased Stairs / Additional Locomotion Stairs: Yes Stairs Assistance: Independent with assistive device Stair Management Technique: Two rails Number of Stairs:  12 Height of Stairs: 6 Ramp: Supervision/Verbal cueing Curb: Nurse, mental health Mobility: No(contraindicated due to sternal precautions)  Trunk/Postural Assessment  Cervical Assessment Cervical Assessment: Within Functional Limits Thoracic Assessment Thoracic Assessment: Within Functional Limits Lumbar Assessment Lumbar Assessment: Within Functional Limits Postural Control Postural Control: Within Functional Limits  Balance Balance Balance Assessed: Yes Static Sitting Balance Static Sitting - Level of Assistance: 7: Independent Dynamic Sitting Balance Dynamic Sitting - Level of Assistance: 5: Stand by assistance Static Standing Balance Static Standing - Level of Assistance: 5: Stand by assistance Dynamic Standing Balance Dynamic Standing - Level of Assistance: 5: Stand by assistance Extremity Assessment  RUE  Assessment General Strength Comments: AROM intact to 90 degrees proximal, WNL distal, no resistance due to sternal precautions, hx of carpal tunnel LUE Assessment General Strength Comments: AROM intact to 90 degrees proximal, WNL distal, no resistance due to sternal precautions, hx of carpal tunnel RLE Assessment RLE Assessment: Within Functional Limits General Strength Comments: Grossly 5/5 throughout LLE Assessment LLE Assessment: Within Functional Limits General Strength Comments: Grossly 5/5 throughout    Refer to Care Plan for Long Term Goals  Recommendations for other services: None   Discharge Criteria: Patient will be discharged from PT if patient refuses treatment 3 consecutive times without medical reason, if treatment goals not met, if there is a change in medical status, if patient makes no progress towards goals or if patient is discharged from hospital.  The above assessment, treatment plan, treatment alternatives and goals were discussed and mutually agreed upon: by patient  Doreene Burke PT, DPT  05/10/2019, 4:44 PM

## 2019-05-11 ENCOUNTER — Inpatient Hospital Stay (HOSPITAL_COMMUNITY): Payer: 59

## 2019-05-11 ENCOUNTER — Encounter: Payer: Self-pay | Admitting: *Deleted

## 2019-05-11 ENCOUNTER — Inpatient Hospital Stay (HOSPITAL_COMMUNITY): Payer: 59 | Admitting: Speech Pathology

## 2019-05-11 MED ORDER — PANTOPRAZOLE SODIUM 40 MG PO TBEC
40.0000 mg | DELAYED_RELEASE_TABLET | Freq: Every day | ORAL | Status: DC
  Administered 2019-05-11 – 2019-05-15 (×5): 40 mg via ORAL
  Filled 2019-05-11 (×5): qty 1

## 2019-05-11 MED ORDER — AMANTADINE HCL 100 MG PO CAPS
100.0000 mg | ORAL_CAPSULE | Freq: Every day | ORAL | Status: DC
  Administered 2019-05-11 – 2019-05-14 (×4): 100 mg via ORAL
  Filled 2019-05-11 (×5): qty 1

## 2019-05-11 NOTE — Progress Notes (Addendum)
Physical Therapy Session Note  Patient Details  Name: Erik Parker MRN: [id number] Date of Birth: 1965/10/13  Today's Date: 05/11/2019 PT Individual Time: 1100-1200 and [phone] PT Individual Time Calculation (min): 60 min  And 60 min  Short Term Goals: Week 1:  PT Short Term Goal 1 (Week 1): STG = LTG due to short ELOS. Week 2:     Skilled Therapeutic Interventions/Progress Updates:    PAIN 4/10 low back, treatment to tolerance, avoidance of excessive lumbar flexion due to discomfort w/this Pt initially supine and agreeable to session focusing on balance and endurance.  Supine to sit adhering to sternal precautions without cues, supervision.   STS w/supervision and gait 133ft w/supervision without signficant deviations. Balance assessments as follows: Berg Balance assessment performed.  See flowsheet for details.  Score as follows: Patient demonstrates increased fall risk as noted by score of  51 /56 on Berg Balance Scale.  (<36= high risk for falls, close to 100%; 37-45 significant >80%; 46-51 moderate >50%; 52-55 lower >25%)  walk test - completed 970ft w/supervision.  Functional Gait Test performed w/the following results:    Balance: Standardized Balance Assessment Standardized Balance Assessment: (P) Functional Gait Assessment Berg Balance Test Sit to Stand: Able to stand without using hands and stabilize independently Standing Unsupported: Able to stand safely 2 minutes Sitting with Back Unsupported but Feet Supported on Floor or Stool: Able to sit safely and securely 2 minutes Stand to Sit: Sits safely with minimal use of hands Transfers: Able to transfer safely, minor use of hands Standing Unsupported with Eyes Closed: Able to stand 10 seconds safely Standing Ubsupported with Feet Together: Able to place feet together independently and stand for 1 minute with supervision From Standing, Reach Forward with Outstretched Arm: Can reach forward >12 cm safely (5") From  Standing Position, Pick up Object from Floor: Able to pick up shoe, needs supervision(limited by back pain/not balance) From Standing Position, Turn to Look Behind Over each Shoulder: Looks behind from both sides and weight shifts well Turn 360 Degrees: Able to turn 360 degrees safely but slowly Standing Unsupported, Alternately Place Feet on Step/Stool: Able to stand independently and safely and complete 8 steps in 20 seconds Standing Unsupported, One Foot in Front: Able to place foot tandem independently and hold 30 seconds Standing on One Leg: Able to lift leg independently and hold > 10 seconds Total Score: 51  Functional Gait  Assessment Gait assessed : Yes Gait Level Surface: Walks 20 ft in less than 5.5 sec, no assistive devices, good speed, no evidence for imbalance, normal gait pattern, deviates no more than 6 in outside of the 12 in walkway width. Change in Gait Speed: Able to smoothly change walking speed without loss of balance or gait deviation. Deviate no more than 6 in outside of the 12 in walkway width. Gait with Horizontal Head Turns: Performs head turns smoothly with no change in gait. Deviates no more than 6 in outside 12 in walkway width Gait with Vertical Head Turns: Performs head turns with no change in gait. Deviates no more than 6 in outside 12 in walkway width. Gait and Pivot Turn: Pivot turns safely within 3 sec and stops quickly with no loss of balance. Step Over Obstacle: Is able to step over 2 stacked shoe boxes taped together (9 in total height) without changing gait speed. No evidence of imbalance. Gait with Narrow Base of Support: Ambulates 7-9 steps. Gait with Eyes Closed: Walks 20 ft, no assistive devices, good  speed, no evidence of imbalance, normal gait pattern, deviates no more than 6 in outside 12 in walkway width. Ambulates 20 ft in less than 7 sec. Ambulating Backwards: Walks 20 ft, no assistive devices, good speed, no evidence for imbalance, normal  gait Steps: Alternating feet, no rail. Total Score: 29 During session pt rpeatedly encouraged to verbalize home town ([place]) /childrens names and cues provided for consonant sounds "R", "K" "L"   Pt returned to room via supervised gait x 144ft, transfers to sitting on edge of bed w/supervision.  Sit to supine w/supervision, good adherence to precautions.   Session Two Pt initially supine and agreeable to session.  Supine to sit, sts, gait w/supervision x 190ft to gym.    PAIN 4/10 low back, states due to increased time in bed, treatment to tolerance.  Functional gait continued/stair assessment performed x 12 steps w/no rail used, step over step gait pattern, supervision. Gait speed assessed, and treatment as follows:  Gait speed: 59meters First effort 9.2sec Second effort 9.0 sec Third effort 9.0  Average gait speed 2.81m/sec  Pathfinding task:   Pt instructed to attend to directions them followed therapist x 33ft including 3 turns.  Pt then able to turn and retrace path without any assistance required.  Gait thru cones requiring very tight turns/pivots/ pt maintains balance and is able to identify all cones in all lower visual fields without difficulty.  Cardiovascular endurance activity: Kinetron seated x 3 min at 30*/sec, rest required x 2 min, HR 130 , 3 min at 90*/sec at RPE 5/10 HR 120  Pt ambulates between gyms and to/from room distances 150-275ft w/supervision, no significant deviations.  Most significant deficits are aphasia, very high level balance and cardiovascular endurance.     Therapy Documentation Precautions:  Precautions Precautions: Sternal, Fall Precaution Comments: patient able to demonstrate understanding of sternal precautions, requires occ cues during functional task completion Restrictions Weight Bearing Restrictions: Yes Other Position/Activity Restrictions: sternal precautions    Therapy/Group: Individual Therapy  Callie Fielding,  [place] 05/11/2019, 12:52 PM

## 2019-05-11 NOTE — Progress Notes (Signed)
[place] PHYSICAL MEDICINE & REHABILITATION PROGRESS NOTE   Subjective/Complaints:   Pt reports through writing and universal sign that  Sternal pain is better with tylenol scheduled- explained still has tramadol he can ask for.   Cortrak is out- and eating 100% of meals.   Was able to speak a few words today for first time.    ROS: denies SOB, CP except sternal pain, abd pain, N/V/C/D   Objective:   No results found. Recent Labs    05/10/19 0612  WBC 11.9*  HGB 11.4*  HCT 35.3*  PLT 481*   Recent Labs    05/10/19 0612  NA 139  K 4.4  CL 105  CO2 25  GLUCOSE 117*  BUN 15  CREATININE 0.65  CALCIUM 9.2    Intake/Output Summary (Last 24 hours) at 05/11/2019 0909 Last data filed at 05/11/2019 0816 Gross per 24 hour  Intake 420 ml  Output 700 ml  Net -280 ml     Physical Exam: Vital Signs Blood pressure 100/66, pulse 66, temperature 98.1 F (36.7 C), resp. rate 18, height 6' (1.829 m), weight 109.1 kg, SpO2 96 %.  Physical Exam  Vitals reviewed. Constitutional:pt sitting up in bed; cortrak out; just finished breakfast, smiling some, NAD HENT:  Head:Beola Cord OUT; conjugate gaze still Respiratory: CTA B/L- good air movement CV: RRR still GI: soft, NT< ND< (+) hyperactive after eating.  Musculoskeletal:     Comments: No edema or tenderness in extremities  Neurological: He is alert.  Patient is alert in no acute distress.   Makes eye contact with examiner.   Motor: 4+/5 throughout Expressive aphasia but pt able to say go Home this AM which is new. Making gains.  Skin: Skin is warm and dry.  Chest incision on sternum- betadine noted- TTP  Psychiatric: bright affect   Assessment/Plan: 1. Functional deficits secondary to R hemiparesis and aphasia and dysphagia due to  left opercular infarction, and aortic dissection which require 3+ hours per day of interdisciplinary therapy in a comprehensive inpatient rehab setting.  Physiatrist is providing close  team supervision and 24 hour management of active medical problems listed below.  Physiatrist and rehab team continue to assess barriers to discharge/monitor patient progress toward functional and medical goals  Care Tool:  Bathing    Body parts bathed by patient: Right arm, Left arm, Chest, Abdomen, Front perineal area, Buttocks, Right upper leg, Left upper leg, Right lower leg, Left lower leg, Face         Bathing assist Assist Level: Supervision/Verbal cueing     Upper Body Dressing/Undressing Upper body dressing   What is the patient wearing?: Pull over shirt    Upper body assist Assist Level: Minimal Assistance - Patient > 75%    Lower Body Dressing/Undressing Lower body dressing      What is the patient wearing?: Underwear/pull up, Pants     Lower body assist Assist for lower body dressing: Contact Guard/Touching assist     Toileting Toileting    Toileting assist Assist for toileting: Supervision/Verbal cueing     Transfers Chair/bed transfer  Transfers assist     Chair/bed transfer assist level: Supervision/Verbal cueing     Locomotion Ambulation   Ambulation assist      Assist level: Supervision/Verbal cueing Assistive device: No Device Max distance: >200 ft   Walk 10 feet activity   Assist     Assist level: Supervision/Verbal cueing Assistive device: No Device   Walk 50 feet activity  Assist    Assist level: Supervision/Verbal cueing Assistive device: No Device    Walk 150 feet activity   Assist    Assist level: Supervision/Verbal cueing Assistive device: No Device    Walk 10 feet on uneven surface  activity   Assist     Assist level: Supervision/Verbal cueing     Wheelchair     Assist Will patient use wheelchair at discharge?: No(patient is a functional ambulator)   Wheelchair activity did not occur: Safety/medical concerns(contraindicated due to sternal precautions)         Wheelchair 50 feet with  2 turns activity    Assist    Wheelchair 50 feet with 2 turns activity did not occur: Safety/medical concerns       Wheelchair 150 feet activity     Assist  Wheelchair 150 feet activity did not occur: Safety/medical concerns       Blood pressure 100/66, pulse 66, temperature 98.1 F (36.7 C), resp. rate 18, height 6' (1.829 m), weight 109.1 kg, SpO2 96 %.   Medical Problem List and Plan: 1.  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  3/12- set d/c date as next Tuesday- 05/15/19 as d/c date             Admit to CIR 2.  Antithrombotics: -DVT/anticoagulation: SCDs. Walking a lot.  3/12- doesn't need lovenox per surgeons              -antiplatelet therapy: Aspirin 325 mg daily 3. Pain Management: Tramadol as needed  3/11- scheduled tylenol for pain since hurting a lot.   3/12- pain much better controlled 4. Mood: Provide emotional support             -antipsychotic agents: N/A 5. Neuropsych: This patient is capable of making decisions on his own behalf. 6. Skin/Wound Care: Routine skin checks 7. Fluids/Electrolytes/Nutrition: Routine in and outs.  CMP ordered. 8.  Post stroke dysphagia.  Dysphagia #1 nectar thick liquids.  Follow-up speech therapy  3/11- pt asking about getting Cortrak out- explained need OK from SLP- he understands.   3/12- cortrak out- diet upgraded to regular diet with thin liquids.  9.  Hypertension/V. tach.  Lopressor 25 mg twice daily.  3/11- well controlled- con't meds    Follow-up cardiology services             Monitor with increased mobility 10.  Postoperative pericarditis.  Continue colchicine 11.  Tobacco abuse.  Counsel  3/11-  pt reports doesn't plan on smoking again- will con't counseling.  12.  Hyperlipidemia.  Crestor 13. Expressive aphasia  3/12- will try Amantadine 100 mg daily to see if will improve speech output.    LOS: 2 days A FACE TO FACE EVALUATION WAS PERFORMED  Ankur Snowdon 05/11/2019, 9:09 AM

## 2019-05-11 NOTE — Progress Notes (Signed)
Social Work Patient ID: Erik Parker, male   DOB: 1965/08/02, 54 y.o.   MRN: [id number]   SW called pt s/o [name] [phone]) to complete assessment, however, voicemail full. SW will continue to make efforts.   2nd attempt- no answer and unable to leave a message.   SW will continue to make attempts.  Cecile Sheerer, MSW, LCSWA Office: [phone] Cell: [phone] Fax: [phone]

## 2019-05-11 NOTE — Progress Notes (Addendum)
Speech Language Pathology Daily Session Note  Patient Details  Name: Erik Parker MRN: [id number] Date of Birth: 10/11/1965  Today's Date: 05/11/2019 SLP Individual Time: 0830-0900 SLP Individual Time Calculation (min): 30 min  Short Term Goals: Week 1: SLP Short Term Goal 1 (Week 1): Pt will consume trials of regular diet with minimal s/s of dysphagia/aspiration over 3 sessions prior to diet upgrade. SLP Short Term Goal 1 - Progress (Week 1): Met SLP Short Term Goal 2 (Week 1): Pt will utilize speech intelligibility strategies to produce simple consonant vowel combinations in 6 out fo 10 opportunities with Mod A cues. SLP Short Term Goal 3 (Week 1): Pt will write responses to verbal questions in 7 out of 10 opportunities without phonemic errors and Min A cues.  Skilled Therapeutic Interventions:  Skilled treatment session targeted dysphagia and communication goals. SLP facilitated session by providing skilled observation of pt consuming regular meal tray. Pt presented with functional oral phase and no overt s/s of aspiration. As such, pt is appropriate for diet upgrade. Education provided to pt and his nurse. SLP also facilitated session by providing Mod A faded to Min A verbal and visual cues to correctly spell words in 3 of 5 opportunities. Additionally, SLP provided Max A multimodal cues to produce consonant vowel combinations. Pt was not able to change motor pattern from bilabials. SLP will continue to follow for communication deficits.      Pain    Therapy/Group: Individual Therapy  Edrie Ehrich 05/11/2019, 1:41 PM

## 2019-05-11 NOTE — Progress Notes (Signed)
Occupational Therapy Session Note  Patient Details  Name: Erik Parker MRN: [id number] Date of Birth: 1965/08/06  Today's Date: 05/11/2019 OT Individual Time: 1300-1400 OT Individual Time Calculation (min): 60 min    Short Term Goals: Week 1:  OT Short Term Goal 1 (Week 1): STG = LTG due to short LOS  Skilled Therapeutic Interventions/Progress Updates:    1:1. Pt received in bed agreeable to OT with no pain. Pt completes tasks in kitchen such as kitchen safety assessment identifying 5/5 things unsafe about environment, sweeps floor with LH equipment, transfers into shower and sits on BSC in shower (likely home set up) with MOD I, transfers from couch and recliner with MOD I. Pt completes all mobility with heart pullow and good safety awareness with MOD I. Pt would benefit from continued education on energy conservation techniques. Pt uses written words to communicate with OT, for memory strategies and aide in dual processing twister task. Pt unable to generate pattern of trail making B test 1-a-2-b-3-c etc on his own written on twister board without max VC, however with written list able to follow pattern. Edu re use for memory and communication with others. Pt completes path finding to gift shop and locates 4/5 items without use of written memory aide. Pt returned to room with call light in reach, exit alarm on and all needs met  Therapy Documentation Precautions:  Precautions Precautions: Sternal, Fall Precaution Comments: patient able to demonstrate understanding of sternal precautions, requires occ cues during functional task completion Restrictions Weight Bearing Restrictions: Yes Other Position/Activity Restrictions: sternal precautions General:   Vital Signs:   Pain:   ADL: ADL Eating: Set up Where Assessed-Eating: Wheelchair Grooming: Supervision/safety Where Assessed-Grooming: Standing at sink Upper Body Bathing: Setup, Supervision/safety Where Assessed-Upper Body  Bathing: Chair Lower Body Bathing: Supervision/safety, Setup Where Assessed-Lower Body Bathing: Chair Upper Body Dressing: Minimal assistance Where Assessed-Upper Body Dressing: Chair Lower Body Dressing: Supervision/safety, Setup Where Assessed-Lower Body Dressing: Chair Toileting: Supervision/safety Where Assessed-Toileting: Glass blower/designer: Close supervision Toilet Transfer Method: Counselling psychologist: Raised toilet seat ADL Comments: not cleared to shower, cues for sternal precautions Vision   Perception    Praxis   Exercises:   Other Treatments:     Therapy/Group: Individual Therapy  Tonny Branch 05/11/2019, 1:54 PM

## 2019-05-12 ENCOUNTER — Inpatient Hospital Stay (HOSPITAL_COMMUNITY): Payer: 59 | Admitting: Physical Therapy

## 2019-05-12 ENCOUNTER — Inpatient Hospital Stay (HOSPITAL_COMMUNITY): Payer: 59 | Admitting: Speech Pathology

## 2019-05-12 ENCOUNTER — Inpatient Hospital Stay (HOSPITAL_COMMUNITY): Payer: 59 | Admitting: Occupational Therapy

## 2019-05-12 NOTE — Plan of Care (Signed)
  Problem: Consults Goal: RH STROKE PATIENT EDUCATION Description: See Patient Education module for education specifics  Outcome: Progressing   Problem: RH BOWEL ELIMINATION Goal: RH STG MANAGE BOWEL WITH ASSISTANCE Description: STG Manage Bowel with  mod Assistance. Outcome: Progressing   Problem: RH BLADDER ELIMINATION Goal: RH STG MANAGE BLADDER WITH ASSISTANCE Description: STG Manage Bladder With mod Assistance Outcome: Progressing   Problem: RH PAIN MANAGEMENT Goal: RH STG PAIN MANAGED AT OR BELOW PT'S PAIN GOAL Description: <3 on a 0-10 pain scale Outcome: Progressing

## 2019-05-12 NOTE — Progress Notes (Signed)
[place] PHYSICAL MEDICINE & REHABILITATION PROGRESS NOTE   Subjective/Complaints:   Pt reports via hand signals and writing- sternum pain still bothering him- not asking for tramadol often, but says "can tolerate' it with tylenol explained don't want to push meds, but if impacting therapy, suggest asking for tramadol.  No side effects from amantadine- explained why started it and what it can help with.   Wants to leave this weekend.    ROS:  Pt denies SOB, abd pain, CP, N/V/C/D, and vision changes Sternal pain as above.   Objective:   No results found. Recent Labs    05/10/19 0612  WBC 11.9*  HGB 11.4*  HCT 35.3*  PLT 481*   Recent Labs    05/10/19 0612  NA 139  K 4.4  CL 105  CO2 25  GLUCOSE 117*  BUN 15  CREATININE 0.65  CALCIUM 9.2    Intake/Output Summary (Last 24 hours) at 05/12/2019 1416 Last data filed at 05/12/2019 1256 Gross per 24 hour  Intake 757 ml  Output 700 ml  Net 57 ml     Physical Exam: Vital Signs Blood pressure 121/74, pulse 74, temperature (P) 98.2 F (36.8 C), temperature source (P) Oral, resp. rate 19, height 6' (1.829 m), weight (P) 108.7 kg, SpO2 98 %.  Physical Exam  Vitals reviewed. Constitutional:pt sitting up in bed; using cardiac pillow when coughs, leans forward, NAD HENT:  Head:.no nystagmus seen Respiratory: CTA b/L- no accessory muscle use CV: RRR GI: soft, NT, ND, (+)BS hypoactive  Musculoskeletal:     Comments: No edema or tenderness in extremities  Neurological: He is alert.  Patient is alert in no acute distress.   Makes eye contact with examiner.   Motor: 4+/5 throughout Able to SAY yes and no today- making gains with expressive aphasia Skin: Skin is warm and dry.  Sternal incision looks good- healing- no erythema or drainage- also has 2 drain sites on abdomen looking good- no drains in them Psychiatric: appropriate   Assessment/Plan: 1. Functional deficits secondary to R hemiparesis and aphasia and  dysphagia due to  left opercular infarction, and aortic dissection which require 3+ hours per day of interdisciplinary therapy in a comprehensive inpatient rehab setting.  Physiatrist is providing close team supervision and 24 hour management of active medical problems listed below.  Physiatrist and rehab team continue to assess barriers to discharge/monitor patient progress toward functional and medical goals  Care Tool:  Bathing    Body parts bathed by patient: Right arm, Left arm, Chest, Abdomen, Front perineal area, Buttocks, Right upper leg, Left upper leg, Right lower leg, Left lower leg, Face         Bathing assist Assist Level: Supervision/Verbal cueing     Upper Body Dressing/Undressing Upper body dressing   What is the patient wearing?: Pull over shirt    Upper body assist Assist Level: Minimal Assistance - Patient > 75%    Lower Body Dressing/Undressing Lower body dressing      What is the patient wearing?: Underwear/pull up, Pants     Lower body assist Assist for lower body dressing: Contact Guard/Touching assist     Toileting Toileting    Toileting assist Assist for toileting: Independent with assistive device Assistive Device Comment: (urinal)   Transfers Chair/bed transfer  Transfers assist     Chair/bed transfer assist level: Supervision/Verbal cueing     Locomotion Ambulation   Ambulation assist      Assist level: Supervision/Verbal cueing Assistive device: No  Device Max distance: 900   Walk 10 feet activity   Assist     Assist level: Supervision/Verbal cueing Assistive device: No Device   Walk 50 feet activity   Assist    Assist level: Supervision/Verbal cueing Assistive device: No Device    Walk 150 feet activity   Assist    Assist level: Supervision/Verbal cueing Assistive device: No Device    Walk 10 feet on uneven surface  activity   Assist     Assist level: Supervision/Verbal cueing      Wheelchair     Assist Will patient use wheelchair at discharge?: No(patient is a functional ambulator)   Wheelchair activity did not occur: Safety/medical concerns(contraindicated due to sternal precautions)         Wheelchair 50 feet with 2 turns activity    Assist    Wheelchair 50 feet with 2 turns activity did not occur: Safety/medical concerns       Wheelchair 150 feet activity     Assist  Wheelchair 150 feet activity did not occur: Safety/medical concerns       Blood pressure 121/74, pulse 74, temperature (P) 98.2 F (36.8 C), temperature source (P) Oral, resp. rate 19, height 6' (1.829 m), weight (P) 108.7 kg, SpO2 98 %.   Medical Problem List and Plan: 1.  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  3/12- set d/c date as next Tuesday- 05/15/19 as d/c date             Admit to CIR 2.  Antithrombotics: -DVT/anticoagulation: SCDs. Walking a lot.  3/12- doesn't need lovenox per surgeons              -antiplatelet therapy: Aspirin 325 mg daily 3. Pain Management: Tramadol as needed  3/11- scheduled tylenol for pain since hurting a lot.   3/12- pain much better controlled 4. Mood: Provide emotional support             -antipsychotic agents: N/A 5. Neuropsych: This patient is capable of making decisions on his own behalf. 6. Skin/Wound Care: Routine skin checks 7. Fluids/Electrolytes/Nutrition: Routine in and outs.  CMP ordered. 8.  Post stroke dysphagia.  Dysphagia #1 nectar thick liquids.  Follow-up speech therapy  3/11- pt asking about getting Cortrak out- explained need OK from SLP- he understands.   3/12- cortrak out- diet upgraded to  regular diet with thin liquids.  9.  Hypertension/V. tach.  Lopressor 25 mg twice daily.  3/11- well controlled- con't meds  3/13- BP well controlled- 120s-130s SBP- con't meds    Follow-up cardiology services             Monitor with increased mobility 10.  Postoperative pericarditis.  Continue colchicine 11.  Tobacco abuse.  Counsel  3/11- pt reports doesn't plan on smoking again- will con't counseling.  12.  Hyperlipidemia.  Crestor 13. Expressive aphasia  3/12- will try Amantadine 100 mg daily to see if will improve speech output.   3/13- pt has no side effects- will con't- making speech gains.   14/ Dispo  3/13- d/c Tuesday and doing IPOC paperwork today for pt.   LOS: 3 days A FACE  TO FACE EVALUATION WAS PERFORMED  Ahyan Kreeger 05/12/2019, 2:16 PM

## 2019-05-12 NOTE — Progress Notes (Signed)
Speech Language Pathology Daily Session Note  Patient Details  Name: Erik Parker MRN: [id number] Date of Birth: [birth year]  Today's Date: 05/12/2019 SLP Individual Time: 6433-2951 SLP Individual Time Calculation (min): 41 min  Short Term Goals: Week 1: SLP Short Term Goal 1 (Week 1): Pt will consume trials of regular diet with minimal s/s of dysphagia/aspiration over 3 sessions prior to diet upgrade. SLP Short Term Goal 1 - Progress (Week 1): Met SLP Short Term Goal 2 (Week 1): Pt will utilize speech intelligibility strategies to produce simple consonant vowel combinations in 6 out fo 10 opportunities with Mod A cues. SLP Short Term Goal 3 (Week 1): Pt will write responses to verbal questions in 7 out of 10 opportunities without phonemic errors and Min A cues.  Skilled Therapeutic Interventions:  Skilled treatment session targeted communication goals and caregiver education with patient's wife. SLP facilitated session by providing mirror, tongue depressor and picture of tongue placement for frontal sounds such as /t,d/. With much effort pt able to achieve correct position but unable to produce appropriate sound - he continues to move back of tongue for sound). Education provided to pt and his wife on impairment and ways to facilitate communicate as well as prognosis for recovery.      Pain    Therapy/Group: Individual Therapy  Rhealynn Myhre 05/12/2019, 3:46 PM

## 2019-05-12 NOTE — IPOC Note (Signed)
Overall Plan of Care [facility]) Patient Details Name: Erik Parker MRN: [id number] DOB: [birth year]  Admitting Diagnosis: Cerebral embolism with cerebral infarction [facility])  Hospital Problems: Principal Problem:   Cerebral embolism with cerebral infarction Active Problems:   Aortic dissection (HCC)   Pericarditis   Aphasia as late effect of cerebrovascular accident (CVA)   Dysphagia     Functional Problem List: Nursing Behavior, Bladder, Bowel, Edema, Pain, Safety, Skin Integrity  PT Balance, Behavior, Edema, Endurance, Motor, Nutrition, Pain, Perception, Safety, Sensory, Skin Integrity  OT Balance, Endurance, Motor  SLP    TR         Basic ADL's: OT Grooming, Bathing, Dressing, Toileting     Advanced  ADL's: OT       Transfers: PT Bed Mobility, Bed to Chair, Car, Furniture, Floor  OT Toilet, Tub/Shower     Locomotion: PT Ambulation, Psychologist, prison and probation services, Stairs     Additional Impairments: OT    SLP Swallowing, Communication expression    TR      Anticipated Outcomes Item Anticipated Outcome  Self Feeding independent  Swallowing  Mod I   Basic self-care  mod I  Toileting  mod I   Bathroom Transfers mod I  Bowel/Bladder  Mod assist   Transfers  Independent  Locomotion  Independent >1000 ft  Communication  Max A for verbal words/ Mod A for written information  Cognition     Pain  <3 on a 0-10 pain scale  Safety/Judgment  Mod assist with safety   Therapy Plan: PT Intensity: Minimum of 1-2 x/day ,45 to 90 minutes PT Frequency: 5 out of 7 days PT Duration Estimated Length of Stay: 4-6 days OT Intensity: Minimum of 1-2 x/day, 45 to 90 minutes OT Frequency: 5 out of 7 days OT Duration/Estimated Length of Stay: 4-6 days SLP Intensity: Minumum of 1-2 x/day, 30 to 90 minutes SLP Frequency: 3 to 5 out of 7 days SLP Duration/Estimated Length of Stay: 5 to 7 days   Due to the current state of emergency, patients may not be receiving their 3-hours of  Medicare-mandated therapy.   Team Interventions: Nursing Interventions Patient/Family Education, Disease Management/Prevention, Skin Care/Wound Management, Discharge Planning, Pain Management, Cognitive Remediation/Compensation, Psychosocial Support, Bowel Management, Bladder Management, Medication Management  PT interventions Ambulation/gait training, Discharge planning, Functional mobility training, Psychosocial support, Therapeutic Activities, Balance/vestibular training, Disease management/prevention, Neuromuscular re-education, Skin care/wound management, Therapeutic Exercise, Cognitive remediation/compensation, DME/adaptive equipment instruction, Pain management, Splinting/orthotics, UE/LE Strength taining/ROM, Community reintegration, Development worker, international aid stimulation, Patient/family education, Museum/gallery curator, UE/LE Coordination activities  OT Interventions Warden/ranger, Disease mangement/prevention, Self Care/advanced ADL retraining, DME/adaptive equipment instruction, Skin care/wound managment, Community reintegration, Equities trader education, UE/LE Coordination activities, Discharge planning, Functional mobility training, Therapeutic Activities  SLP Interventions Speech/Language facilitation, Dysphagia/aspiration precaution training, Patient/family education  TR Interventions    SW/CM Interventions Discharge Planning, Psychosocial Support, Patient/Family Education   Barriers to Discharge MD  Medical stability, Home enviroment access/loayout, Wound care, Lack of/limited family support, Weight bearing restrictions and apahsia  Nursing Inaccessible home environment, Decreased caregiver support, Home environment access/layout, Incontinence, Lack of/limited family support, Medication compliance, Behavior    PT Behavior requires cues to maintain sternal precautions, has 24/7 supervision available with family  OT      SLP      SW       Team Discharge Planning: Destination:  PT-Home ,OT- Home , SLP-Home Projected Follow-up: PT-Outpatient PT, OT-  Outpatient OT, SLP-Home Health SLP, Outpatient SLP Projected Equipment Needs: PT-None recommended by PT, OT- 3  in 1 bedside comode, SLP-None recommended by SLP Equipment Details: PT- , OT-  Patient/family involved in discharge planning: PT- Patient,  OT-Patient, SLP-Patient  MD ELOS: 5-7 days Medical Rehab Prognosis:  Excellent Assessment:   Right side weakness with facial droop aphasia/dysphagia secondary to left opercular infarction, 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  Aphasia is improving slightly- started amantadine.    Goals Mod I expcet max assist for speech.   See Team Conference Notes for weekly updates to the plan of care

## 2019-05-12 NOTE — Progress Notes (Signed)
Physical Therapy Session Note  Patient Details  Name: Erik Parker MRN: [id number] Date of Birth: [birth year]  Today's Date: 05/12/2019 PT Individual Time: 9470-9628 PT Individual Time Calculation (min): 70 min   Short Term Goals: Week 1:  PT Short Term Goal 1 (Week 1): STG = LTG due to short ELOS.  Skilled Therapeutic Interventions/Progress Updates:    Pt received seated in bed, agreeable to PT session. No complaints of pain. Bed mobility at mod I level. Sit to stand with Supervision. Pt indicates need to use bathroom. Pt is Supervision for standing balance while attempting to urinate, unable to void. Ambulation to therapy gym at Supervision level. Provided pt with printout of "schedule" for therapy session for pt to follow along with and keep track of time while completing tasks. Pt needs cueing to attend to clock for timing of tasks. Pt performs static standing balance on airex with no UE support and Supervision for balance while completing peg board designs x 2. Pt is able to complete tasks correctly with min cueing. Ambulation through obstacle course at Supervision level stepping on/off airex, up/down 4" curb step, weaving through cones, and performing sidesteps with cone taps. Pt is then able to retrieve cones from the floor via squatting with CGA for balance. Pt indicates he feels short of breath following cone retrieval, able to recover with seated rest break. Encouraged pt to take breaks as needed. Ambulation from therapy unit to first floor then outdoors. Pt is able to ambulate up/down inclines, ambulation across uneven ground, navigate stairs with no handrails, and navigate functional spaces of elevators and tables/chair in atrium at Supervision level. Pt is able to recall path taken to reach 1st floor and navigate back to his room with min cueing. Pt then indicates again urge to use the bathroom. Toilet transfer with Supervision, pt able to continently void (see Flowsheet for details).  Assisted pt back to bed, mod I for bed mobility. Pt left semi-reclined in bed with needs in reach at end of session.  Therapy Documentation Precautions:  Precautions Precautions: Sternal, Fall Precaution Comments: patient able to demonstrate understanding of sternal precautions, requires occ cues during functional task completion Restrictions Weight Bearing Restrictions: Yes(sternal) Other Position/Activity Restrictions: sternal precautions    Therapy/Group: Individual Therapy   Peter Congo, PT, DPT  05/12/2019, 3:31 PM

## 2019-05-12 NOTE — Progress Notes (Signed)
Occupational Therapy Session Note  Patient Details  Name: Erik Parker MRN: [id number] Date of Birth: [birth year]  Today's Date: 05/12/2019 OT Individual Time: 1000-1058 OT Individual Time Calculation (min): 58 min   Short Term Goals: Week 1:  OT Short Term Goal 1 (Week 1): STG = LTG due to short LOS  Skilled Therapeutic Interventions/Progress Updates:    Pt greeted in the bathroom, spouse Sherrice assisting him with dressing while he was sitting on the toilet. Spouse agreeable and eager to begin family education during session. Started by education regarding nonslip footwear during all functional mobility and also to wipe down bathroom with sanitary wipes after use due to semiprivate room. Pt needed A to don gripper socks due to increased pain when attempting himself. Reviewed his sternal precautions with spouse, printing out sternal precaution handout for her and placing it in a health resource notebook. We placed his other health educational materials in the same notebook (I.e. nutrition and tobacco cessation) and discussed using the health checklist prior to d/c. Discussed functional implications of his sternal precautions with spouse very receptive. Cleared her off on safety plan for assisting pt to the chair or toilet. She is aware that pt is not cleared medically to shower, states that she will assist him with sponge bathing at home. OT educated pt and spouse about [place], emphasizing that when he is cleared medically to shower, f/u therapy can assist with equipment needs and safety. Sherrice provided supervision assist while pt completed car transfers and stairs. Pt able to transfer in and out of car that is slightly lower than the one he will d/c from the hospital in. Pt ascended and descended 4 stairs without UE rail support for precaution adherence. Also went over bed mobility with pt stating he plans to raise [facility] using pillows to increase comfort. Bed was elevated to height at home and pt able  to transition in, out, and scoot, all without UE use and supervision. We also discussed having pt complete self care tasks at max level of independence at home, even if this took increased time. Pt able to doff and don shirt while standing with supervision, demonstrating good understanding of his sternal precautions. At end of session pt was left with spouse. Sherrice wanted to roll him around the unit in his w/c. We discussed that pt could not ambulate or propel the w/c and he could not go to other floors of the hospital. Sherrice verbalized understanding and that pt would only ambulate once they returned to room for transferring purposes. RN made aware that pt was out of the room with spouse.   Therapy Documentation Precautions:  Precautions Precautions: Sternal, Fall Precaution Comments: patient able to demonstrate understanding of sternal precautions, requires occ cues during functional task completion Restrictions Weight Bearing Restrictions: Yes(sternal) Other Position/Activity Restrictions: sternal precautions  Vital Signs: Therapy Vitals Pulse Rate: 74 BP: 121/74 Patient Position (if appropriate): Sitting Pain: Pt denied pain during tx   ADL: ADL Eating: Set up Where Assessed-Eating: Wheelchair Grooming: Supervision/safety Where Assessed-Grooming: Standing at sink Upper Body Bathing: Setup, Supervision/safety Where Assessed-Upper Body Bathing: Chair Lower Body Bathing: Supervision/safety, Setup Where Assessed-Lower Body Bathing: Chair Upper Body Dressing: Minimal assistance Where Assessed-Upper Body Dressing: Chair Lower Body Dressing: Supervision/safety, Setup Where Assessed-Lower Body Dressing: Chair Toileting: Supervision/safety Where Assessed-Toileting: Glass blower/designer: Close supervision Toilet Transfer Method: Counselling psychologist: Raised toilet seat ADL Comments: not cleared to shower, cues for sternal precautions      Therapy/Group:  Individual Therapy  Jolene Guyett A Kelten Enochs 05/12/2019, 12:48 PM

## 2019-05-13 ENCOUNTER — Inpatient Hospital Stay (HOSPITAL_COMMUNITY): Payer: 59 | Admitting: Speech Pathology

## 2019-05-13 ENCOUNTER — Inpatient Hospital Stay (HOSPITAL_COMMUNITY): Payer: 59 | Admitting: Occupational Therapy

## 2019-05-13 ENCOUNTER — Inpatient Hospital Stay (HOSPITAL_COMMUNITY): Payer: 59

## 2019-05-13 MED ORDER — TRAZODONE HCL 50 MG PO TABS
50.0000 mg | ORAL_TABLET | Freq: Every day | ORAL | Status: DC
  Administered 2019-05-13 – 2019-05-14 (×2): 50 mg via ORAL
  Filled 2019-05-13 (×2): qty 1

## 2019-05-13 NOTE — Progress Notes (Addendum)
Erik Parker PHYSICAL MEDICINE & REHABILITATION PROGRESS NOTE   Subjective/Complaints:   Pt reports pain in sternum is 2-3/10- didn't sleep asking for something to sleep; very LONG night.    Wants something for sleep scheduled, not prn.    ROS:   Pt denies SOB, abd pain, CP, N/V/C/D, and vision changes   Objective:   No results found. No results for input(s): WBC, HGB, HCT, PLT in the last 72 hours. No results for input(s): NA, K, CL, CO2, GLUCOSE, BUN, CREATININE, CALCIUM in the last 72 hours.  Intake/Output Summary (Last 24 hours) at 05/13/2019 1036 Last data filed at 05/13/2019 0856 Gross per 24 hour  Intake 639 ml  Output 750 ml  Net -111 ml     Physical Exam: Vital Signs Blood pressure 124/78, pulse 83, temperature 98.2 F (36.8 C), temperature source Oral, resp. rate 18, height 6' (1.829 m), weight 106.5 kg, SpO2 99 %.  Physical Exam  Vitals reviewed. Constitutional: pt sitting up in bed using cardiac pillow appropriately, wincing when touch sternum, NAD HENT:  Head:.conjugate gaze Respiratory: CTA B/L- good air movement CV: RRR no JVD GI: soft, NT< ND, (+)BS Musculoskeletal:     Comments: No edema or tenderness in extremities  Neurological: He is alert.  Patient is alert in no acute distress.   Makes eye contact with examiner.   Motor: 4+/5 throughout Able to SAY yes and no today- making gains with expressive aphasia Skin: Skin is warm and dry.  Sternal incision covered with betadine- healing well- no drainage/erythema Psychiatric: speaking a few words when repeats mainly.    Assessment/Plan: 1. Functional deficits secondary to R hemiparesis and aphasia and dysphagia due to  left opercular infarction, and aortic dissection which require 3+ hours per day of interdisciplinary therapy in a comprehensive inpatient rehab setting.  Physiatrist is providing close team supervision and 24 hour management of active medical problems listed below.  Physiatrist and  rehab team continue to assess barriers to discharge/monitor patient progress toward functional and medical goals  Care Tool:  Bathing    Body parts bathed by patient: Right arm, Left arm, Chest, Abdomen, Front perineal area, Buttocks, Right upper leg, Left upper leg, Right lower leg, Left lower leg, Face         Bathing assist Assist Level: Supervision/Verbal cueing     Upper Body Dressing/Undressing Upper body dressing   What is the patient wearing?: Pull over shirt    Upper body assist Assist Level: Minimal Assistance - Patient > 75%    Lower Body Dressing/Undressing Lower body dressing      What is the patient wearing?: Underwear/pull up, Pants     Lower body assist Assist for lower body dressing: Independent     Toileting Toileting    Toileting assist Assist for toileting: Independent Assistive Device Comment: (urinal)   Transfers Chair/bed transfer  Transfers assist     Chair/bed transfer assist level: Supervision/Verbal cueing     Locomotion Ambulation   Ambulation assist      Assist level: Supervision/Verbal cueing Assistive device: No Device Max distance: 900   Walk 10 feet activity   Assist     Assist level: Supervision/Verbal cueing Assistive device: No Device   Walk 50 feet activity   Assist    Assist level: Supervision/Verbal cueing Assistive device: No Device    Walk 150 feet activity   Assist    Assist level: Supervision/Verbal cueing Assistive device: No Device    Walk 10 feet on uneven surface  activity   Assist     Assist level: Supervision/Verbal cueing     Wheelchair     Assist Will patient use wheelchair at discharge?: No(patient is a functional ambulator)   Wheelchair activity did not occur: Safety/medical concerns(contraindicated due to sternal precautions)         Wheelchair 50 feet with 2 turns activity    Assist    Wheelchair 50 feet with 2 turns activity did not occur:  Safety/medical concerns       Wheelchair 150 feet activity     Assist  Wheelchair 150 feet activity did not occur: Safety/medical concerns       Blood pressure 124/78, pulse 83, temperature 98.2 F (36.8 C), temperature source Oral, resp. rate 18, height 6' (1.829 m), weight 106.5 kg, SpO2 99 %.   Medical Problem List and Plan: 1.  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  3/12- set d/c date as next Tuesday- 05/15/19 as d/c date             Admit to CIR 2.  Antithrombotics: -DVT/anticoagulation: SCDs. Walking a lot.  3/12- doesn't need lovenox per surgeons              -antiplatelet therapy: Aspirin 325 mg daily 3. Pain Management: Tramadol as needed  3/11- scheduled tylenol for pain since hurting a lot.   3/12- pain much better controlled  3/14- pt assured me didn't need tramadol but encouraged if impacts therapy since wincing upon palpation.  4. Mood: Provide emotional support             -antipsychotic agents: N/A 5. Neuropsych: This patient is capable of making decisions on his own behalf. 6. Skin/Wound Care: Routine skin checks 7. Fluids/Electrolytes/Nutrition: Routine in and outs.  CMP ordered. 8.  Post stroke dysphagia.  Dysphagia #1 nectar thick liquids.  Follow-up speech therapy  3/11- pt asking about getting Cortrak out- explained need OK from SLP- he understands.   3/12- cortrak out- diet upgraded to regular diet with thin liquids.  9.  Hypertension/V. tach.  Lopressor 25 mg twice daily.  3/11- well controlled- con't meds  3/13- BP well controlled- 120s-130s SBP- con't meds    Follow-up cardiology services             Monitor with increased  mobility 10.  Postoperative pericarditis.  Continue colchicine 11.  Tobacco abuse.  Counsel  3/11- pt reports doesn't plan on smoking again- will con't counseling.   3/14- pt assures me won't smoke again, but was long habit- needs encouragement to stop.  12.  Hyperlipidemia.  Crestor 13. Expressive aphasia  3/12- will try Amantadine 100 mg daily to see if will improve speech output.   3/13- pt has no side effects- will con't- making speech gains.   3/14- pt now repeating interviewer somewhat- doing better daily.  14. Insomnia  3/14- added trazodone 50 mg QHS 15. Dispo  3/13- d/c Tuesday and doing IPOC paperwork today for pt.   LOS: 4 days A FACE TO FACE EVALUATION WAS PERFORMED  June Vacha 05/13/2019, 10:36 AM

## 2019-05-13 NOTE — Progress Notes (Signed)
Speech Language Pathology Daily Session Note  Patient Details  Name: Erik Parker MRN: [id number] Date of Birth: 1965-07-06  Today's Date: 05/13/2019 SLP Individual Time: 1117-1200 SLP Individual Time Calculation (min): 43 min  Short Term Goals: Week 1: SLP Short Term Goal 1 (Week 1): Pt will consume trials of regular diet with minimal s/s of dysphagia/aspiration over 3 sessions prior to diet upgrade. SLP Short Term Goal 1 - Progress (Week 1): Met SLP Short Term Goal 2 (Week 1): Pt will utilize speech intelligibility strategies to produce simple consonant vowel combinations in 6 out fo 10 opportunities with Mod A cues. SLP Short Term Goal 3 (Week 1): Pt will write responses to verbal questions in 7 out of 10 opportunities without phonemic errors and Min A cues.  Skilled Therapeutic Interventions: Pt was seen for skilled ST targeting communication goals. SLP used personally relevant topics to facilitate conversation regarding pt's family and career. Pt spelled names of family members, city of residence, and occupations with excellent awareness of spelling errors and no more than Min A verbal cues to correct them. Provided Max A cues for articulatory placement and models, pt produced CV and CVC combinations, as well as approximations of family members' names. Considering both contextual cues, written communication from pt in combination with verbalizations and gestures, pt effectively communicated messages throughout session with only 1 total communication breakdown between therapist and pt. Pt left sitting in wheelchair with alarm set and needs met to his satisfaction. Continue per current plan of care.        Pain Pain Assessment Pain Scale: 0-10 Pain Score: 0-No pain  Therapy/Group: Individual Therapy  Arbutus Leas 05/13/2019, 12:04 PM

## 2019-05-13 NOTE — Progress Notes (Signed)
Occupational Therapy Session Note  Patient Details  Name: Erik Parker MRN: [id number] Date of Birth: [birth year]  Today's Date: 05/13/2019 OT Individual Time: 1330-1430 OT Individual Time Calculation (min): 60 min    Short Term Goals: Week 1:  OT Short Term Goal 1 (Week 1): STG = LTG due to short LOS  Skilled Therapeutic Interventions/Progress Updates:    Patient in bed, alert and ready for therapy session.  He states that pain at incision is a "2" currently.  He demonstrates good understanding and carryover of sternal precautions t/o session. Expressive language is improving for 1-2 word responses/comments and he is able to write longer explanations with less difficulty.   Bed mobility independent.  Sit to stand and functional mobility without AD on unit CS/DS.   Completed reach and transport of items in kitchen environment reviewing recommendations for energy conservation and to maintain sternal precautions - he demonstrates ability at a mod I level.  Reviewed various options for tub/shower seats when he is able to shower - he demonstrates good understanding.   Reviewed and completed various dexterity exercises - provided and practiced theraputty exercises.  He notes that he continues to have radial sensory deficits related to carpel tunnel but no pain - has splints at home that were effective prior to hospitalization.  He ambulated back to room, returned to bed.  Bed alarm set and call bell in reach.    Therapy Documentation Precautions:  Precautions Precautions: Sternal, Fall Precaution Comments: patient able to demonstrate understanding of sternal precautions, requires occ cues during functional task completion Restrictions Weight Bearing Restrictions: No Other Position/Activity Restrictions: sternal precautions General:   Vital Signs: Therapy Vitals Temp: 98.2 F (36.8 C) Temp Source: Oral Pulse Rate: (!) 58 Resp: 18 BP: 115/71 Patient Position (if appropriate): Lying Oxygen  Therapy SpO2: 99 % O2 Device: Room Air  Therapy/Group: Individual Therapy  Barrie Lyme 05/13/2019, 7:49 AM

## 2019-05-13 NOTE — Discharge Summary (Signed)
Physician Discharge Summary  Patient ID: Erik Parker MRN: [id number] DOB/AGE: [birth year] 54 y.o.  Admit date: 05/09/2019 Discharge date: 05/15/2019  Discharge Diagnoses:  Principal Problem:   Cerebral embolism with cerebral infarction Active Problems:   Aortic dissection (HCC)   Pericarditis   Aphasia as late effect of cerebrovascular accident (CVA)   Dysphagia DVT prophylaxis Hypertension Hyperlipidemia Insomnia Tobacco abuse  Discharged Condition: Stable  Significant Diagnostic Studies: CT ANGIO HEAD W OR WO CONTRAST  Result Date: 05/03/2019 CLINICAL DATA:  Multifocal stroke.  Vascular dissection. EXAM: CT ANGIOGRAPHY HEAD AND NECK TECHNIQUE: Multidetector CT imaging of the head and neck was performed using the standard protocol during bolus administration of intravenous contrast. Multiplanar CT image reconstructions and MIPs were obtained to evaluate the vascular anatomy. Carotid stenosis measurements (when applicable) are obtained utilizing NASCET criteria, using the distal internal carotid diameter as the denominator. CONTRAST:  80mL OMNIPAQUE IOHEXOL 350 MG/ML SOLN COMPARISON:  MRI same day.  CT studies yesterday. FINDINGS: CTA NECK FINDINGS Aortic arch: The patient has had interval surgical repair of the aorta. Based on the start of the scan, dissection flap remains visible at the arch and proximal descending aorta. The ascending aorta is not included on this scan. As seen previously, the dissection involves also the innominate artery and left common carotid artery as well as the proximal left subclavian artery. Right carotid system: Dissection extension into the right common carotid artery appears the same, ending proximal to the bifurcation. The true lumen is larger than was seen yesterday. Dissection does not extend into the internal carotid artery. Left carotid system: As seen yesterday, the dissection extends throughout the left common carotid artery. The true lumen is mm or 2  larger than was seen yesterday. The dissection extends into the internal carotid artery as high as the C2 level but does not extend intracranial. Vertebral arteries: Both vertebral arteries perfuse normally. Skeleton: Ordinary spondylosis. Other neck: Otherwise negative Upper chest: Postoperative changes as expected. Review of the MIP images confirms the above findings CTA HEAD FINDINGS Anterior circulation: Both internal carotid arteries are patent through the siphon regions. The anterior and middle cerebral vessels show flow. No proximal stenosis. No discernible large vessel occlusion presently. Some luxury perfusion in the region of the left operculum ir infarction. Posterior circulation: Both vertebral arteries widely patent to the basilar. No basilar stenosis. Posterior circulation branch vessels are normal. Primary fetal origin of both posterior cerebral arteries. Venous sinuses: Patent and normal. Anatomic variants: None other significant. Review of the MIP images confirms the above findings IMPRESSION: Interval aortic repair, not primarily or completely evaluated. Dissection again extensions into both carotid systems but does not appear to have propagated any further. In fact, true luminal size of the common carotid regions is improved. No intracranial large vessel occlusion demonstrable presently. Small region of luxury perfusion related to the left frontal operculum infarction. Electronically Signed   By: Paulina FusiMark  Shogry M.D.   On: 05/03/2019 00:15   CT Angio Head W or Wo Contrast  Result Date: 05/01/2019 CLINICAL DATA:  54 year old male code stroke presentation with right side weakness. EXAM: CT ANGIOGRAPHY HEAD AND NECK CT PERFUSION BRAIN TECHNIQUE: Multidetector CT imaging of the head and neck was performed using the standard protocol during bolus administration of intravenous contrast. Multiplanar CT image reconstructions and MIPs were obtained to evaluate the vascular anatomy. Carotid stenosis  measurements (when applicable) are obtained utilizing NASCET criteria, using the distal internal carotid diameter as the denominator. Multiphase CT imaging of the  brain was performed following IV bolus contrast injection. Subsequent parametric perfusion maps were calculated using RAPID software. CONTRAST:  OMNIPAQUE IOHEXOL 350 MG/ML SOLN COMPARISON:  Plain head CT 0658 hours today. FINDINGS: CT Brain Perfusion Findings: ASPECTS: 8/10 CBF (<30%) Volume: 0mL (underestimated) Perfusion (Tmax>6.0s) volume: 23mL - which appears to correspond to the area of left operculum cytotoxic edema by plain CT. Mismatch Volume: Unclear Infarction Location:Left operculum CTA NECK Skeleton: No acute osseous abnormality identified. Upper chest: No superior mediastinal lymphadenopathy. Negative visible upper lungs. Other neck: Negative for neck mass or lymphadenopathy. Aortic arch: Positive for a Type A Aortic Dissection, with intimal flap in the ascending aorta on series 5, image 184. the dissection continues through the arch into the visible proximal descending aorta, and involves although great vessel origins. Right carotid system: Dissected brachiocephalic artery and subtotal thrombosed right CCA origin (series 5, image 155). Dissection and/or thrombus related RADIOGRAPHIC STRING SIGN stenosis of the right CCA at the level of the supraglottic larynx (series 5, image 119). However, the dissection then appears to terminates proximal to the right carotid bifurcation which appears widely patent. Some soft plaque suspected in the proximal right ICA, no definite right ICA dissection and no significant stenosis. Left carotid system: Dissection tracks through the origin of the left CCA and through the left carotid bifurcation into the left ICA (series 5, image 103). The flap then appears to terminates at a level just distal to the left ICA bulb. Overall moderate stenosis of the left CCA and ICA results. The left ICA appears fairly  normal at the skull base. Vertebral arteries: The right subclavian artery origin is less affected by the dissection. Normal right vertebral artery origin. The right vertebral artery appears patent and normal to the skull base. The dissection tracks through the proximal left subclavian artery but appears to terminates proximal to the left vertebral artery origin. The left vertebral origin appears within normal limits. And the left vertebral artery appears patent and normal to the skull base. CTA HEAD Posterior circulation: Codominant distal vertebral arteries with no stenosis. Patent vertebrobasilar junction. Both a ICAs appear dominant. Patent basilar artery without stenosis. Patent SCA origins. Fetal type left PCA. Patent right PCA origin. Diminutive right posterior communicating artery. Bilateral PCA branches are within normal limits. Anterior circulation: Both ICA siphons are patent. On the left there is no significant plaque or stenosis with normal left ophthalmic and posterior communicating artery origins. On the right there is mild ICA siphon plaque but no significant stenosis. Normal right ophthalmic and posterior communicating artery origins. Patent carotid termini. Anterior communicating artery and bilateral ACA branches are within normal limits. Right MCA M1 segment and bifurcation are patent without stenosis. Right MCA branches are within normal limits. Left MCA M1 segment and bifurcation are patent without stenosis. No left MCA M2 branch occlusion is identified. There is a subtle paucity of left anterior operculum branches, but no discrete branch occlusion can be identified. Venous sinuses: Patent. Anatomic variants: Fetal type left PCA origin. Review of the MIP images confirms the above findings IMPRESSION: 1. Positive for a Stanford Type A Aortic Dissection, which severely involves both carotid arteries. The dissection extends extending farther into the Left Carotid (left ICA bulb) but results in a  Radiographic-String-Sign stenosis of the Right CCA. Critical Value/emergent results were called by telephone to Dr. Meridee Score on 05/01/2019 at 08:12 . 2. No superimposed intracranial emergent large vessel occlusion. 3. CT Brain Perfusion may be unreliable secondary to #1, but does not  suggest ischemic penumbra outside of the left operculum core infarct visible by plain CT (ASPECTS 8). Electronically Signed   By: Odessa Fleming M.D.   On: 05/01/2019 08:26   DG Chest 1 View  Result Date: 05/03/2019 CLINICAL DATA:  CABG 2 days ago. EXAM: CHEST  1 VIEW COMPARISON:  One-view chest x-ray 05/02/2019 FINDINGS: The heart is enlarged, exaggerated by low lung volumes. Swan-Ganz catheter was removed. Mediastinal drains remain in place. Right-sided chest tube scratched at left-sided chest tube is stable. Mild bibasilar atelectasis is improving. Surgical clips are present at the right axilla. IMPRESSION: 1. Improving bibasilar atelectasis. 2. Interval removal of Swan-Ganz catheter. Electronically Signed   By: Marin Roberts M.D.   On: 05/03/2019 06:52   DG Chest 2 View  Result Date: 05/06/2019 CLINICAL DATA:  54 year old male with history of chest pain. EXAM: CHEST - 2 VIEW COMPARISON:  Chest x-ray 05/05/2019. FINDINGS: Previously noted right IJ Cordis has been removed. Feeding tube extending into the body of the stomach. Lung volumes are normal. No consolidative airspace disease. No pleural effusions. No pneumothorax. No pulmonary nodule or mass noted. Pulmonary vasculature is normal. Heart size is mildly enlarged. Upper mediastinal contours are within normal limits. Status post median sternotomy. IMPRESSION: 1. Support apparatus, as above. 2. No radiographic evidence of acute cardiopulmonary disease. 3. Mild cardiomegaly. Electronically Signed   By: Trudie Reed M.D.   On: 05/06/2019 06:11   CT ANGIO NECK W OR WO CONTRAST  Result Date: 05/03/2019 CLINICAL DATA:  Multifocal stroke.  Vascular dissection. EXAM: CT  ANGIOGRAPHY HEAD AND NECK TECHNIQUE: Multidetector CT imaging of the head and neck was performed using the standard protocol during bolus administration of intravenous contrast. Multiplanar CT image reconstructions and MIPs were obtained to evaluate the vascular anatomy. Carotid stenosis measurements (when applicable) are obtained utilizing NASCET criteria, using the distal internal carotid diameter as the denominator. CONTRAST:  80mL OMNIPAQUE IOHEXOL 350 MG/ML SOLN COMPARISON:  MRI same day.  CT studies yesterday. FINDINGS: CTA NECK FINDINGS Aortic arch: The patient has had interval surgical repair of the aorta. Based on the start of the scan, dissection flap remains visible at the arch and proximal descending aorta. The ascending aorta is not included on this scan. As seen previously, the dissection involves also the innominate artery and left common carotid artery as well as the proximal left subclavian artery. Right carotid system: Dissection extension into the right common carotid artery appears the same, ending proximal to the bifurcation. The true lumen is larger than was seen yesterday. Dissection does not extend into the internal carotid artery. Left carotid system: As seen yesterday, the dissection extends throughout the left common carotid artery. The true lumen is mm or 2 larger than was seen yesterday. The dissection extends into the internal carotid artery as high as the C2 level but does not extend intracranial. Vertebral arteries: Both vertebral arteries perfuse normally. Skeleton: Ordinary spondylosis. Other neck: Otherwise negative Upper chest: Postoperative changes as expected. Review of the MIP images confirms the above findings CTA HEAD FINDINGS Anterior circulation: Both internal carotid arteries are patent through the siphon regions. The anterior and middle cerebral vessels show flow. No proximal stenosis. No discernible large vessel occlusion presently. Some luxury perfusion in the region of  the left operculum ir infarction. Posterior circulation: Both vertebral arteries widely patent to the basilar. No basilar stenosis. Posterior circulation branch vessels are normal. Primary fetal origin of both posterior cerebral arteries. Venous sinuses: Patent and normal. Anatomic variants: None other  significant. Review of the MIP images confirms the above findings IMPRESSION: Interval aortic repair, not primarily or completely evaluated. Dissection again extensions into both carotid systems but does not appear to have propagated any further. In fact, true luminal size of the common carotid regions is improved. No intracranial large vessel occlusion demonstrable presently. Small region of luxury perfusion related to the left frontal operculum infarction. Electronically Signed   By: Paulina Fusi M.D.   On: 05/03/2019 00:15   CT Angio Neck W and/or Wo Contrast  Result Date: 05/01/2019 CLINICAL DATA:  54 year old male code stroke presentation with right side weakness. EXAM: CT ANGIOGRAPHY HEAD AND NECK CT PERFUSION BRAIN TECHNIQUE: Multidetector CT imaging of the head and neck was performed using the standard protocol during bolus administration of intravenous contrast. Multiplanar CT image reconstructions and MIPs were obtained to evaluate the vascular anatomy. Carotid stenosis measurements (when applicable) are obtained utilizing NASCET criteria, using the distal internal carotid diameter as the denominator. Multiphase CT imaging of the brain was performed following IV bolus contrast injection. Subsequent parametric perfusion maps were calculated using RAPID software. CONTRAST:  OMNIPAQUE IOHEXOL 350 MG/ML SOLN COMPARISON:  Plain head CT 0658 hours today. FINDINGS: CT Brain Perfusion Findings: ASPECTS: 8/10 CBF (<30%) Volume: 0mL (underestimated) Perfusion (Tmax>6.0s) volume: 23mL - which appears to correspond to the area of left operculum cytotoxic edema by plain CT. Mismatch Volume: Unclear Infarction  Location:Left operculum CTA NECK Skeleton: No acute osseous abnormality identified. Upper chest: No superior mediastinal lymphadenopathy. Negative visible upper lungs. Other neck: Negative for neck mass or lymphadenopathy. Aortic arch: Positive for a Type A Aortic Dissection, with intimal flap in the ascending aorta on series 5, image 184. the dissection continues through the arch into the visible proximal descending aorta, and involves although great vessel origins. Right carotid system: Dissected brachiocephalic artery and subtotal thrombosed right CCA origin (series 5, image 155). Dissection and/or thrombus related RADIOGRAPHIC STRING SIGN stenosis of the right CCA at the level of the supraglottic larynx (series 5, image 119). However, the dissection then appears to terminates proximal to the right carotid bifurcation which appears widely patent. Some soft plaque suspected in the proximal right ICA, no definite right ICA dissection and no significant stenosis. Left carotid system: Dissection tracks through the origin of the left CCA and through the left carotid bifurcation into the left ICA (series 5, image 103). The flap then appears to terminates at a level just distal to the left ICA bulb. Overall moderate stenosis of the left CCA and ICA results. The left ICA appears fairly normal at the skull base. Vertebral arteries: The right subclavian artery origin is less affected by the dissection. Normal right vertebral artery origin. The right vertebral artery appears patent and normal to the skull base. The dissection tracks through the proximal left subclavian artery but appears to terminates proximal to the left vertebral artery origin. The left vertebral origin appears within normal limits. And the left vertebral artery appears patent and normal to the skull base. CTA HEAD Posterior circulation: Codominant distal vertebral arteries with no stenosis. Patent vertebrobasilar junction. Both a ICAs appear dominant.  Patent basilar artery without stenosis. Patent SCA origins. Fetal type left PCA. Patent right PCA origin. Diminutive right posterior communicating artery. Bilateral PCA branches are within normal limits. Anterior circulation: Both ICA siphons are patent. On the left there is no significant plaque or stenosis with normal left ophthalmic and posterior communicating artery origins. On the right there is mild ICA siphon plaque but  no significant stenosis. Normal right ophthalmic and posterior communicating artery origins. Patent carotid termini. Anterior communicating artery and bilateral ACA branches are within normal limits. Right MCA M1 segment and bifurcation are patent without stenosis. Right MCA branches are within normal limits. Left MCA M1 segment and bifurcation are patent without stenosis. No left MCA M2 branch occlusion is identified. There is a subtle paucity of left anterior operculum branches, but no discrete branch occlusion can be identified. Venous sinuses: Patent. Anatomic variants: Fetal type left PCA origin. Review of the MIP images confirms the above findings IMPRESSION: 1. Positive for a Stanford Type A Aortic Dissection, which severely involves both carotid arteries. The dissection extends extending farther into the Left Carotid (left ICA bulb) but results in a Radiographic-String-Sign stenosis of the Right CCA. Critical Value/emergent results were called by telephone to Dr. Aletta Edouard on 05/01/2019 at 08:12 . 2. No superimposed intracranial emergent large vessel occlusion. 3. CT Brain Perfusion may be unreliable secondary to #1, but does not suggest ischemic penumbra outside of the left operculum core infarct visible by plain CT (ASPECTS 8). Electronically Signed   By: Genevie Ann M.D.   On: 05/01/2019 08:26   MR BRAIN WO CONTRAST  Result Date: 05/02/2019 CLINICAL DATA:  Right facial droop and speech disturbance with right arm weakness yesterday. Follow-up. Vascular dissection. Left frontal  stroke. EXAM: MRI HEAD WITHOUT CONTRAST TECHNIQUE: Multiplanar, multiecho pulse sequences of the brain and surrounding structures were obtained without intravenous contrast. COMPARISON:  CT studies done yesterday. FINDINGS: Brain: Diffusion imaging shows a 3.5-4 cm region of acute infarction in the left frontal operculum as previously seen. There is additionally a 2 cm acute infarction at the left parietooccipital junction, a punctate acute infarction in the left occipital lobe, and a subcentimeter acute infarction in the right frontal lobe. 1 cm cortical infarction at the left posterior frontal vertex, probably the precentral gyrus. Few other punctate foci in the left hemisphere. No evidence of mass effect or hematoma. Minimal petechial blood products in the largest left frontal operculum stroke. Elsewhere, the brain appears normal. No pre-existing vascular insults. No hydrocephalus or extra-axial collection. Vascular: Major vessels at the base of the brain show flow. Skull and upper cervical spine: Negative Sinuses/Orbits: Sinuses are clear. Tiny left mastoid effusion. Orbits negative. Other: None IMPRESSION: Scattered areas of acute infarction in both hemispheres, more extensive on the left than the right, consistent with embolic infarctions. The largest area of involvement is a 3.5-4 cm stroke in the left frontal operculum, with mild swelling and minimal petechial blood products. Scattered other strokes in the left hemisphere, next largest measuring about 2 cm at the left parietooccipital junction. Single acute infarction in the right hemisphere measuring less than a cm in size in the right frontal lobe. No brainstem or cerebellar stroke. Electronically Signed   By: Nelson Chimes M.D.   On: 05/02/2019 22:22   CT CEREBRAL PERFUSION W CONTRAST  Result Date: 05/01/2019 CLINICAL DATA:  54 year old male code stroke presentation with right side weakness. EXAM: CT ANGIOGRAPHY HEAD AND NECK CT PERFUSION BRAIN  TECHNIQUE: Multidetector CT imaging of the head and neck was performed using the standard protocol during bolus administration of intravenous contrast. Multiplanar CT image reconstructions and MIPs were obtained to evaluate the vascular anatomy. Carotid stenosis measurements (when applicable) are obtained utilizing NASCET criteria, using the distal internal carotid diameter as the denominator. Multiphase CT imaging of the brain was performed following IV bolus contrast injection. Subsequent parametric perfusion maps were calculated  using RAPID software. CONTRAST:  OMNIPAQUE IOHEXOL 350 MG/ML SOLN COMPARISON:  Plain head CT 0658 hours today. FINDINGS: CT Brain Perfusion Findings: ASPECTS: 8/10 CBF (<30%) Volume: 0mL (underestimated) Perfusion (Tmax>6.0s) volume: 23mL - which appears to correspond to the area of left operculum cytotoxic edema by plain CT. Mismatch Volume: Unclear Infarction Location:Left operculum CTA NECK Skeleton: No acute osseous abnormality identified. Upper chest: No superior mediastinal lymphadenopathy. Negative visible upper lungs. Other neck: Negative for neck mass or lymphadenopathy. Aortic arch: Positive for a Type A Aortic Dissection, with intimal flap in the ascending aorta on series 5, image 184. the dissection continues through the arch into the visible proximal descending aorta, and involves although great vessel origins. Right carotid system: Dissected brachiocephalic artery and subtotal thrombosed right CCA origin (series 5, image 155). Dissection and/or thrombus related RADIOGRAPHIC STRING SIGN stenosis of the right CCA at the level of the supraglottic larynx (series 5, image 119). However, the dissection then appears to terminates proximal to the right carotid bifurcation which appears widely patent. Some soft plaque suspected in the proximal right ICA, no definite right ICA dissection and no significant stenosis. Left carotid system: Dissection tracks through the origin of  the left CCA and through the left carotid bifurcation into the left ICA (series 5, image 103). The flap then appears to terminates at a level just distal to the left ICA bulb. Overall moderate stenosis of the left CCA and ICA results. The left ICA appears fairly normal at the skull base. Vertebral arteries: The right subclavian artery origin is less affected by the dissection. Normal right vertebral artery origin. The right vertebral artery appears patent and normal to the skull base. The dissection tracks through the proximal left subclavian artery but appears to terminates proximal to the left vertebral artery origin. The left vertebral origin appears within normal limits. And the left vertebral artery appears patent and normal to the skull base. CTA HEAD Posterior circulation: Codominant distal vertebral arteries with no stenosis. Patent vertebrobasilar junction. Both a ICAs appear dominant. Patent basilar artery without stenosis. Patent SCA origins. Fetal type left PCA. Patent right PCA origin. Diminutive right posterior communicating artery. Bilateral PCA branches are within normal limits. Anterior circulation: Both ICA siphons are patent. On the left there is no significant plaque or stenosis with normal left ophthalmic and posterior communicating artery origins. On the right there is mild ICA siphon plaque but no significant stenosis. Normal right ophthalmic and posterior communicating artery origins. Patent carotid termini. Anterior communicating artery and bilateral ACA branches are within normal limits. Right MCA M1 segment and bifurcation are patent without stenosis. Right MCA branches are within normal limits. Left MCA M1 segment and bifurcation are patent without stenosis. No left MCA M2 branch occlusion is identified. There is a subtle paucity of left anterior operculum branches, but no discrete branch occlusion can be identified. Venous sinuses: Patent. Anatomic variants: Fetal type left PCA origin.  Review of the MIP images confirms the above findings IMPRESSION: 1. Positive for a Stanford Type A Aortic Dissection, which severely involves both carotid arteries. The dissection extends extending farther into the Left Carotid (left ICA bulb) but results in a Radiographic-String-Sign stenosis of the Right CCA. Critical Value/emergent results were called by telephone to Dr. Meridee Score on 05/01/2019 at 08:12 . 2. No superimposed intracranial emergent large vessel occlusion. 3. CT Brain Perfusion may be unreliable secondary to #1, but does not suggest ischemic penumbra outside of the left operculum core infarct visible by plain CT (  ASPECTS 8). Electronically Signed   By: Odessa Fleming M.D.   On: 05/01/2019 08:26   DG Chest Port 1 View  Result Date: 05/05/2019 CLINICAL DATA:  54 year old male with history of chest pain. EXAM: PORTABLE CHEST 1 VIEW COMPARISON:  Chest x-ray 05/04/2019. FINDINGS: There is a right-sided internal jugular central venous catheter with tip terminating in the proximal superior vena cava. A feeding tube is seen extending into the abdomen, however, the tip of the feeding tube extends below the lower margin of the image. Previously noted left-sided chest tube and mediastinal/pericardial drain have been removed. Lung volumes are low. Linear scarring or subsegmental atelectasis in the left mid to lower lung. No acute consolidative airspace disease. No pleural effusions. No pneumothorax. No evidence of pulmonary edema. Heart size is mildly enlarged. Upper mediastinal contours are within normal limits. Surgical clips project over the level of the thoracic inlet and the right axillary region. Status post median sternotomy. IMPRESSION: 1. Postoperative changes and support apparatus, as above. 2. Low lung volumes with probable subsegmental atelectasis in the left lower lobe. 3. Mild cardiomegaly. Electronically Signed   By: Trudie Reed M.D.   On: 05/05/2019 08:58   DG Chest Port 1 View  Result  Date: 05/04/2019 CLINICAL DATA:  Status post cardiac surgery with chest soreness. EXAM: PORTABLE CHEST 1 VIEW COMPARISON:  05/02/2019 FINDINGS: Right IJ Cordis sheath is unchanged. Swan-Ganz catheter from an inferior approach terminates in the descending right pulmonary artery branch, similar. Mediastinal drain unchanged. Prior median sternotomy. Midline trachea. Cardiomegaly accentuated by [facility] portable technique. No pleural effusion or pneumothorax. Similar bibasilar atelectasis. No congestive failure. IMPRESSION: No significant change since one day prior. Cardiomegaly with low lung volumes and bibasilar atelectasis. No pneumothorax or other acute complication. Electronically Signed   By: Jeronimo Greaves M.D.   On: 05/04/2019 08:54   DG Chest Port 1 View  Result Date: 05/02/2019 CLINICAL DATA:  54 year old male status post ascending aortic replacement. EXAM: PORTABLE CHEST 1 VIEW COMPARISON:  Chest x-ray 05/01/2019. FINDINGS: Patient has been extubated. Mediastinal/pericardial drains are noted. Right IJ Cordis through which a Swan-Ganz catheter has been passed into a descending branch of the pulmonary artery in the right lower lobe. Low lung volumes. Bibasilar opacities which likely reflect mild postoperative subsegmental atelectasis. No pleural effusions. No evidence of pulmonary edema. No pneumothorax. Cardiopericardial silhouette is mildly enlarged, stable compared to the prior study. Upper mediastinal contours are within normal limits. Status post median sternotomy. Surgical clips project over the right axillary region. IMPRESSION: 1. Postoperative changes and support apparatus, as above. 2. Low lung volumes with probable bibasilar subsegmental atelectasis. Electronically Signed   By: Trudie Reed M.D.   On: 05/02/2019 09:48   DG Chest Port 1 View  Result Date: 05/01/2019 CLINICAL DATA:  54 year old male with repair of aortic dissection. EXAM: PORTABLE CHEST 1 VIEW COMPARISON:  Chest CT dated 04/30/2019.  FINDINGS: Endotracheal tube with tip approximately 5 cm above the carina. Enteric tube extends below the diaphragm with tip beyond the inferior margin of the image. Right IJ Swan-Ganz catheter with tip projecting over the inferior right hilum. Two inferiorly accessed tubes with tip projecting over the aorta. The tip of the more medial line projects over the proximal descending thoracic aorta and may represent an intra-aortic balloon pump. There is cardiomegaly and mild widening of the mediastinal silhouette. There is mild vascular congestion. No focal consolidation, pleural effusion, or pneumothorax. Median sternotomy wires. IMPRESSION: 1. Cardiomegaly with mild vascular congestion. 2. Support lines  and tubes as described. Electronically Signed   By: Elgie Collard M.D.   On: 05/01/2019 18:13   DG Chest Portable 1 View  Result Date: 04/30/2019 CLINICAL DATA:  Onset chest pain today. EXAM: PORTABLE CHEST 1 VIEW COMPARISON:  PA and lateral chest 05/07/2010. FINDINGS: Lungs clear. Heart size normal. No pneumothorax or pleural fluid. No acute or focal bony abnormality. IMPRESSION: Negative chest. Electronically Signed   By: Drusilla Kanner M.D.   On: 04/30/2019 13:03   DG Abd Portable 1V  Result Date: 05/06/2019 CLINICAL DATA:  Abdominal pain EXAM: PORTABLE ABDOMEN - 1 VIEW COMPARISON:  05/04/2019 FINDINGS: Enteric tube terminates within the stomach. Other lines and tubes are no longer present. Bowel gas pattern is unremarkable. No significant stool burden. Minimal residual contrast within the colon. IMPRESSION: Enteric tube within the stomach.  Unremarkable bowel gas pattern. Electronically Signed   By: Guadlupe Spanish M.D.   On: 05/06/2019 09:28   DG Abd Portable 1V  Result Date: 05/04/2019 CLINICAL DATA:  NG tube placement.  Postop 3 days. EXAM: PORTABLE ABDOMEN - 1 VIEW COMPARISON:  05/02/2019. FINDINGS: Interim placement of feeding tube, its tip is over the stomach. Swan-Ganz catheter noted with tip in  the descending right pulmonary artery is in unchanged position. Mediastinal drainage catheter in unchanged position. No bowel distention. Contrast in the colon. IMPRESSION: Interim placement of feeding tube, its tip is over the stomach. Electronically Signed   By: Maisie Fus  Register   On: 05/04/2019 13:03   DG Abd Portable 1V  Result Date: 05/02/2019 CLINICAL DATA:  Screening for metal prior to MRI EXAM: PORTABLE ABDOMEN - 1 VIEW COMPARISON:  None. FINDINGS: Mild gaseous distension of bowel in the left abdomen, both large and small bowel, favor focal ileus. No radiopaque foreign bodies. No organomegaly or free air. IMPRESSION: No metallic/radiopaque foreign body. Electronically Signed   By: Charlett Nose M.D.   On: 05/02/2019 16:27   DG Swallowing Func-Speech Pathology  Result Date: 05/03/2019 Objective Swallowing Evaluation: Type of Study: MBS-Modified Barium Swallow Study  Patient Details Name: Erik Parker MRN: [id number] Date of Birth: Aug 18, 1965 Today's Date: 05/03/2019 Time: SLP Start Time (ACUTE ONLY): 1040 -SLP Stop Time (ACUTE ONLY): 1100 SLP Time Calculation (min) (ACUTE ONLY): 20 min Past Medical History: Past Medical History: Diagnosis Date . Hyperlipidemia  . Hypertension  Past Surgical History: Past Surgical History: Procedure Laterality Date . ACHILLES TENDON SURGERY Right 11/10/2016  Procedure: ACHILLES TENDON REPAIR VIA SPEED BRIDGE;  Surgeon: [name], DPM;  Location: [facility];  Service: Podiatry;  Laterality: Right; . HEEL SPUR RESECTION Right 11/10/2016  Procedure: HEEL SPUR RESECTION (HAGLUNDS DEFORMITY);  Surgeon: [name], DPM;  Location: [facility];  Service: Podiatry;  Laterality: Right; . REPAIR OF ACUTE ASCENDING THORACIC AORTIC DISSECTION N/A 05/01/2019  Procedure: REPAIR OF ACUTE ASCENDING THORACIC AORTIC DISSECTION USING HEMOSHIELD GRAFT SIZE ;  Surgeon: Linden Dolin, MD;  Location: [facility] OR;  Service: Cardiothoracic;  Laterality: N/A; HPI: DENISE BRAMBLETT is a 54 y.o.  male with history of hypertension, hyperlipidemia.  Patient apparently was seen in the emergency department on 05/01/2019 for right facial droop and inability to speak along with right arm weakness. Currently presnts with expressive aphasia. No h/o swallowing difficulties.  Subjective: alert, cooperative Assessment / Plan / Recommendation CHL IP CLINICAL IMPRESSIONS 05/03/2019 Clinical Impression Pt was seen in radiology suite for modified barium swallow study. Trials of puree solids, dysphagia 2 solids, regular texture solids, thin liquids, and nectar thick liquids via cup  and straw were administered. Pt presents with moderate oropharyngeal dysphagia characterized by impaired bolus control, prolonged mastication, a pharyngeal delay, and reduced anterior laryngeal movement. Bolus manipulation was reduced during mastication and mastication was inadequately thorough. He demonstrated premature spillage to the level of valleculae and pyriform sinuses. The swallow was often triggered with the head of the bolus at the level of the pyriform sinuses with thin liquids and at the level of the valleculae with other consistencies. Aspiration (PAS 7) was noted with thin liquids after the swallow and was likely due to residue in the pyriform sinuses. However, this cannot be conclusively stated since the aspiration event occurred between swallows when the fluoro was off. Coughing was noted throughout the study even in the absence of laryngeal invasion but coughing was ineffective in expelling the aspirant. A puree diet with nectar thick liquids may be started at this time with strict observance of swallowing precautions. However, considering the level of fatigue that he demonstrated and endorsed having at the end of the study, his endurance during meals is questioned and supplemental non-oral nutrition may therefore be needed. SLP will follow to assess diet tolerance and for treatment.  SLP Visit Diagnosis Dysphagia, unspecified  (R13.10) Attention and concentration deficit following -- Frontal lobe and executive function deficit following -- Impact on safety and function Mild aspiration risk;Moderate aspiration risk   CHL IP TREATMENT RECOMMENDATION 05/03/2019 Treatment Recommendations Therapy as outlined in treatment plan below   Prognosis 05/03/2019 Prognosis for Safe Diet Advancement Good Barriers to Reach Goals Language deficits;Severity of deficits Barriers/Prognosis Comment -- CHL IP DIET RECOMMENDATION 05/03/2019 SLP Diet Recommendations Dysphagia 1 (Puree) solids;Nectar thick liquid Liquid Administration via Cup;Straw Medication Administration Crushed with puree Compensations Slow rate;Small sips/bites Postural Changes Remain semi-upright after after feeds/meals (Comment);Seated upright at 90 degrees   CHL IP OTHER RECOMMENDATIONS 05/03/2019 Recommended Consults -- Oral Care Recommendations Oral care BID;Oral care before and after PO Other Recommendations Have oral suction available   CHL IP FOLLOW UP RECOMMENDATIONS 05/03/2019 Follow up Recommendations (No Data)   CHL IP FREQUENCY AND DURATION 05/03/2019 Speech Therapy Frequency (ACUTE ONLY) min 2x/week Treatment Duration 2 weeks      CHL IP ORAL PHASE 05/03/2019 Oral Phase Impaired Oral - Pudding Teaspoon -- Oral - Pudding Cup -- Oral - Honey Teaspoon -- Oral - Honey Cup -- Oral - Nectar Teaspoon -- Oral - Nectar Cup Premature spillage;Decreased bolus cohesion;Delayed oral transit Oral - Nectar Straw Premature spillage;Decreased bolus cohesion;Delayed oral transit Oral - Thin Teaspoon -- Oral - Thin Cup Premature spillage;Decreased bolus cohesion;Delayed oral transit Oral - Thin Straw -- Oral - Puree Decreased bolus cohesion;Delayed oral transit;Piecemeal swallowing Oral - Mech Soft Impaired mastication;Delayed oral transit;Weak lingual manipulation Oral - Regular Impaired mastication;Decreased bolus cohesion Oral - Multi-Consistency -- Oral - Pill -- Oral Phase - Comment --  CHL IP PHARYNGEAL  PHASE 05/03/2019 Pharyngeal Phase Impaired Pharyngeal- Pudding Teaspoon -- Pharyngeal -- Pharyngeal- Pudding Cup -- Pharyngeal -- Pharyngeal- Honey Teaspoon -- Pharyngeal -- Pharyngeal- Honey Cup -- Pharyngeal -- Pharyngeal- Nectar Teaspoon -- Pharyngeal -- Pharyngeal- Nectar Cup -- Pharyngeal -- Pharyngeal- Nectar Straw Delayed swallow initiation-vallecula;Pharyngeal residue - pyriform Pharyngeal -- Pharyngeal- Thin Teaspoon -- Pharyngeal -- Pharyngeal- Thin Cup Delayed swallow initiation-pyriform sinuses;Reduced anterior laryngeal mobility Pharyngeal -- Pharyngeal- Thin Straw Delayed swallow initiation-pyriform sinuses;Penetration/Apiration after swallow;Reduced anterior laryngeal mobility Pharyngeal Material enters airway, passes BELOW cords and not ejected out despite cough attempt by patient Pharyngeal- Puree Reduced anterior laryngeal mobility Pharyngeal -- Pharyngeal- Mechanical Soft Reduced anterior  laryngeal mobility Pharyngeal -- Pharyngeal- Regular -- Pharyngeal -- Pharyngeal- Multi-consistency -- Pharyngeal -- Pharyngeal- Pill -- Pharyngeal -- Pharyngeal Comment --  CHL IP CERVICAL ESOPHAGEAL PHASE 05/03/2019 Cervical Esophageal Phase WFL Pudding Teaspoon -- Pudding Cup -- Honey Teaspoon -- Honey Cup -- Nectar Teaspoon -- Nectar Cup -- Nectar Straw -- Thin Teaspoon -- Thin Cup -- Thin Straw -- Puree -- Mechanical Soft -- Regular -- Multi-consistency -- Pill -- Cervical Esophageal Comment -- Shanika I. Vear Clock, MS, CCC-SLP Acute Rehabilitation Services Office number [phone] Pager [phone] Scheryl Marten 05/03/2019, 12:23 PM              ECHO INTRAOPERATIVE TEE  Result Date: 05/03/2019  *INTRAOPERATIVE TRANSESOPHAGEAL REPORT *  Patient Name:   Erik Parker Date of Exam: 05/01/2019 Medical Rec #:  [id number]    Height:       72.0 in Accession #:    [id number]   Weight:       253.5 lb Date of Birth:  August 07, 1965   BSA:          2.36 m Patient Age:    53 years     BP:           127/51 mmHg Patient  Gender: M            HR:           57 bpm. Exam Location:  Inpatient Transesophogeal exam was perform intraoperatively during surgical procedure. Patient was closely monitored under general anesthesia during the entirety of examination. Indications:     Aortic dissection Performing Phys: [id number] DGUYQIH Z ATKINS Diagnosing Phys: Arrie Aran MD Complications: No known complications during this procedure. POST-OP IMPRESSIONS - Left Ventricle: has mildly reduced systolic function, with an ejection fraction of 50%. - Aorta: A graft was placed in the ascending aorta for repair.Distal hemiarch reconstruction with 28 mm Dacron graft. - Left Atrial Appendage: The left atrial appendage appears unchanged from pre-bypass. - Aortic Valve: No stenosis present. There is no regurgitation. Interval resolution of AI following resuspension of aortic valve and dissection repair. - Mitral Valve: The mitral valve appears unchanged from pre-bypass. - Tricuspid Valve: The tricuspid valve appears unchanged from pre-bypass. - Interatrial Septum: The interatrial septum appears unchanged from pre-bypass. - Pericardium: The pericardium appears unchanged from pre-bypass. PRE-OP FINDINGS  Left Ventricle: The left ventricle has normal systolic function, with an ejection fraction of 60-65%. The cavity size was mildly dilated. There is no increase in left ventricular wall thickness. No evidence of left ventricular regional wall motion abnormalities. Right Ventricle: The right ventricle has normal systolic function. The cavity was normal. There is no increase in right ventricular wall thickness. Left Atrium: Left atrial size was normal in size. The left atrial appendage is well visualized and there is no evidence of thrombus present. Right Atrium: Right atrial size was normal in size. Right atrial pressure is estimated at 10 mmHg. Interatrial Septum: No atrial level shunt detected by color flow Doppler. Pericardium: There is no evidence of  pericardial effusion. Mitral Valve: The mitral valve is normal in structure. Mitral valve regurgitation is not visualized by color flow Doppler. Tricuspid Valve: The tricuspid valve was normal in structure. Tricuspid valve regurgitation was not visualized by color flow Doppler. Aortic Valve: The aortic valve is tricuspid Aortic valve regurgitation is moderate to severe by color flow Doppler. There is no evidence of aortic valve stenosis. Pulmonic Valve: The pulmonic valve was normal in structure. Pulmonic valve regurgitation is trivial by color  flow Doppler. Aorta: There is evidence of a dissection in the ascending aorta and Dissection flap originating at STJ and extending to proximal descending aorta. Pulmonary Artery: The pulmonary artery is of normal size.  Arrie Aran MD Electronically signed by Arrie Aran MD Signature Date/Time: 05/03/2019/7:06:21 AM    Final    CT HEAD CODE STROKE WO CONTRAST  Result Date: 05/01/2019 CLINICAL DATA:  Code stroke.  Right-sided weakness and facial droop. EXAM: CT HEAD WITHOUT CONTRAST TECHNIQUE: Contiguous axial images were obtained from the base of the skull through the vertex without intravenous contrast. COMPARISON:  None. FINDINGS: Brain: Focal loss of gray-white differentiation is present in the left frontal operculum both the level the basal ganglia and the super ganglionic level. The insular ribbon is intact. Basal ganglia are normal. No acute hemorrhage or mass lesion is present. Ventricles are normal. No significant extraaxial fluid collection is present. The brainstem and cerebellum are within normal limits. Vascular: No hyperdense vessel or unexpected calcification. Skull: Calvarium is intact. No focal lytic or blastic lesions are present. No significant extracranial soft tissue lesion is present. Sinuses/Orbits: The paranasal sinuses and mastoid air cells are clear. The globes and orbits are within normal limits. ASPECTS [facility] Stroke Program Erik CT Score) -  Ganglionic level infarction (caudate, lentiform nuclei, internal capsule, insula, M1-M3 cortex): 6/7 - Supraganglionic infarction (M4-M6 cortex): 2/3 Total score (0-10 with 10 being normal): 8/10 IMPRESSION: 1. Left frontal operculum acute nonhemorrhagic infarct. 2. ASPECTS is 8/10 3. CTA head and neck and CT perfusion would be useful for further evaluation potential for intervention in this patient with an acute infarct. These results were called by telephone at the time of interpretation on 05/01/2019 at 7:17 am to provider Dr. Clayborne Dana, who verbally acknowledged these results. Electronically Signed   By: Marin Roberts M.D.   On: 05/01/2019 07:17   ECHOCARDIOGRAM LIMITED  Result Date: 05/03/2019    ECHOCARDIOGRAM LIMITED REPORT   Patient Name:   Erik Parker Date of Exam: 05/03/2019 Medical Rec #:  [id number]    Height:       72.0 in Accession #:    [id number]   Weight:       250.9 lb Date of Birth:  1965/12/20   BSA:          2.346 m Patient Age:    53 years     BP:           134/75 mmHg Patient Gender: M            HR:           83 bpm. Exam Location:  Inpatient Procedure: Limited Echo                      STAT ECHO Reported to: Dr Nicholes Mango on 05/03/2019 6:05:00 PM       Dr. Gala Romney reviewed echo at bedside. Indications:    Ventricular Tachycardia I47.2  History:        Patient has prior history of Echocardiogram examinations, most                 recent 05/01/2019. Risk Factors:Current Smoker. Aortic dissection.                 S/p ascending aortic replacement.  Sonographer:    Ross Ludwig RDCS (AE) Referring Phys: [id number] Aleeya Veitch R BENSIMHON  Sonographer Comments: No subcostal window. Drainage bandages in place subcostally. Large bandage over sternum. IMPRESSIONS  1. Prominent  paradoxical septal motion and respiratory displacement of the intraventricular septum is seen. Left ventricular ejection fraction, by estimation, is 45 to 50%. The left ventricle has mildly decreased function. The left ventricle  demonstrates global hypokinesis. There is mild concentric left ventricular hypertrophy. Left ventricular diastolic parameters were normal.  2. Right ventricular systolic function not well seen, probably mildly reduced. The right ventricular size is normal. Tricuspid regurgitation signal is inadequate for assessing PA pressure.  3. The mitral valve is normal in structure and function. No evidence of mitral valve regurgitation.  4. Native aortic valve preserved/repaired. The aortic valve is tricuspid. Aortic valve regurgitation is not visualized. Aortic valve mean gradient measures 1.7 mmHg.  5. 28 mm tube graft seen in the ascending aorta, with intact anastomoses. Residual dissection is not identified (on limited aortic views). 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 FINDINGS  Left Ventricle: Prominent paradoxical septal motion and respiratory displacement of the intraventricular septum is seen. Left ventricular ejection fraction, by estimation, is 45 to 50%. The left ventricle has mildly decreased function. The left ventricle demonstrates global hypokinesis. The left ventricular internal cavity size was normal in size. There is mild concentric left ventricular hypertrophy. Abnormal (paradoxical) septal motion consistent with post-operative status. Indeterminate filling pressures. Right Ventricle: The right ventricular size is normal. Right ventricular systolic function not well seen, probably mildly reduced. Tricuspid regurgitation signal is inadequate for assessing PA pressure. Left Atrium: Left atrial size was normal in size. Right Atrium: Right atrial size was not well visualized. Pericardium: There is no evidence of pericardial effusion. Mitral Valve: The mitral valve is normal in structure and function. Tricuspid Valve: The tricuspid valve is not well visualized. Tricuspid valve regurgitation is not demonstrated. Aortic Valve: Native aortic valve preserved/repaired. The aortic valve is  tricuspid. Aortic valve regurgitation is not visualized. Aortic valve mean gradient measures 1.7 mmHg. Aortic valve peak gradient measures 3.3 mmHg. Aortic valve area, by VTI measures 3.31 cm. Pulmonic Valve: The pulmonic valve was grossly normal. Pulmonic valve regurgitation is not visualized. Aorta: 28 mm tube graft seen in the ascending aorta, with intact anastomoses. Residual dissection is not identified (on limited aortic views). 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  LEFT VENTRICLE PLAX 2D LVIDd:         4.30 cm  Diastology LVIDs:         3.70 cm  LV e' lateral:   7.51 cm/s LV PW:         1.37 cm  LV E/e' lateral: 10.1 LV IVS:        1.34 cm  LV e' medial:    8.49 cm/s LVOT diam:     2.00 cm  LV E/e' medial:  8.9 LV SV:         47 LV SV Index:   20 LVOT Area:     3.14 cm  LEFT ATRIUM         Index LA diam:    3.00 cm 1.28 cm/m  AORTIC VALVE AV Area (Vmax):    3.19 cm AV Area (Vmean):   3.01 cm AV Area (VTI):     3.31 cm AV Vmax:           90.37 cm/s AV Vmean:          60.400 cm/s AV VTI:            0.141 m AV Peak Grad:      3.3 mmHg AV Mean Grad:      1.7 mmHg LVOT  Vmax:         91.90 cm/s LVOT Vmean:        57.900 cm/s LVOT VTI:          0.149 m LVOT/AV VTI ratio: 1.05  AORTA Ao Root diam: 3.90 cm MITRAL VALVE MV Area (PHT): 3.53 cm    SHUNTS MV Decel Time: 215 msec    Systemic VTI:  0.15 m MV E velocity: 75.80 cm/s  Systemic Diam: 2.00 cm MV A velocity: 47.10 cm/s MV E/A ratio:  1.61 Mihai Croitoru MD Electronically signed by Thurmon Fair MD Signature Date/Time: 05/03/2019/7:29:59 PM    Final     Labs:  Basic Metabolic Panel: Recent Labs  Lab 05/10/19 0612  NA 139  K 4.4  CL 105  CO2 25  GLUCOSE 117*  BUN 15  CREATININE 0.65  CALCIUM 9.2    CBC: Recent Labs  Lab 05/10/19 0612  WBC 11.9*  NEUTROABS 7.2  HGB 11.4*  HCT 35.3*  MCV 86.9  PLT 481*    CBG: No results for input(s): GLUCAP in the last 168 hours.  Family history.  Mother with hypertension  Father with hypertension.  Denies any diabetes mellitus colon cancer or stomach cancer  Brief HPI:   Erik Parker is a 54 y.o. right-handed male with history of hypertension, hyperlipidemia as well as tobacco abuse.  Patient lives with spouse independent prior to admission working full-time.  Wife works 4 days a week.  Presented 05/01/2019 with right facial droop chest pain and right arm weakness.  Troponin negative, BMP within normal limits, WBC 15,600.  CT of the head showed left frontal operculum acute nonhemorrhagic infarction.  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  No superimposed intracranial hemorrhage or large vessel occlusion.  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 Vickey Sages.  Intraoperative echocardiogram with ejection fraction of 50%.  Follow-up MRI of the brain showed scattered areas of acute infarction in both hemispheres more extensive on the left than right, consistent with embolic infarctions.  The largest area of involvement 3.5 to 4 cm stroke in the left frontal operculum with mild swelling and minimal petechial blood products.  Scattered other strokes in the left hemisphere next largest measuring 2 cm at the left parieto-occipital junction.  Single acute infarction in the right hemisphere measuring less than a centimeter in size in the right frontal lobe.  Maintain on aspirin for CVA prophylaxis.  Acute postoperative pericarditis maintained on colchicine.  Follow cardiology service for episode of V. tach 23 beats echocardiogram ejection fraction 50% no wall motion abnormalities to suggest ACS.  Patient did remain on metoprolol.  Hospital course complicated by dysphagia dysphagia #1 nectar thick liquid diet nasogastric tube in place.  Patient was admitted for a comprehensive rehab program.   Hospital Course: Erik Parker was admitted to rehab 05/09/2019 for inpatient therapies  to consist of PT, ST and OT at least three hours five days a week. Past admission physiatrist, therapy team and rehab RN have worked together to provide customized collaborative inpatient rehab.  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  Sternal precautions.  Surgical site healing nicely.  Patient follow-up cardiothoracic surgeon as well as neurology services.  Patient remained on aspirin therapy 325 mg daily.  DVT prophylaxis SCDs patient ambulatory.  Pain management use of tramadol as  needed.  Post stroke dysphagia diet advanced to regular nasogastric tube removed follow-up speech therapy.  Hypertension V. tach Lopressor 25 mg twice daily no chest pain or shortness of breath follow-up cardiology services.  Patient with history of tobacco abuse received counts regards to cessation of nicotine products.  Crestor ongoing for hyperlipidemia.  Expressive aphasia follow-up speech therapy trial of amantadine patient now repeating interviewer somewhat doing much better with speech.  Bouts of insomnia with trial of trazodone   Blood pressures were monitored on TID basis and controlled   He/ is continent of bowel and bladder.  He/ has made gains during rehab stay and is attending therapies  He/ will continue to receive follow up therapies   after discharge  Rehab course: During patient's stay in rehab weekly team conferences were held to monitor patient's progress, set goals and discuss barriers to discharge. At admission, patient required minimal assist ambulate 120 feet without assistive device minimal guard sit to stand, moderate assist sit to supine.  Moderate assist upper body bathing moderate assist lower body bathing moderate assist upper body dressing moderate assist lower body dressing minimal assist toilet transfers  Physical exam.  Blood pressure 125/79 pulse 88  temperature 98.7 respirations 18 oxygen saturation 97% room air Constitutional well-developed well-nourished HEENT Head.  Normocephalic and atraumatic Eyes.  Pupils round and reactive to light no discharge.nystagmus Neck.  Supple nontender no JVD without thyromegaly Respiratory effort normal no respiratory distress without wheeze Cardiac regular rate rhythm without any extra sounds or murmur heard GI.  Soft nontender positive bowel sounds without rebound Musculoskeletal No edema or tenderness in extremities Neurological.  Alert no acute distress expressive aphasia.  Motor strength 4+ out of 5.  He/  has had improvement in activity tolerance, balance, postural control as well as ability to compensate for deficits. He/ has had improvement in functional use RUE/LUE  and RLE/LLE as well as improvement in awareness.  Bed mobility modified independent sit to stand supervision perform consistently throughout sessions.  Ambulates throughout the rehab unit supervision without assistive device.  Required contact-guard minimal assist at times for loss of balance.  Performed various gait pattern utilizing the agility ladder for balance and cognition.  Gather belongings for activities day living and homemaking.  Speech therapy use personality relevant topics to facilitate conversation regarding patient's family and career.  Patient spelled names of family member city of residence in occupations with excellent awareness of spelling errors and no more than minimal verbal cues to correct them.  Provided max assist cues for articulatory placement and models.  Full family teaching completed plan discharge to home       Disposition: Discharge to home    Diet: Regular  Special Instructions: No driving smoking or alcohol  Sternal precautions  Medications at discharge 1.  Tylenol as needed 2.  Amantadine 200 mg p.o. daily 3.  Aspirin 325 mg p.o. daily 4.  Dulcolax tablet daily hold for loose stools 5.   Colchicine 0.6 mg p.o. twice daily 6.  Folic acid 1 mg p.o. daily 7.  Lopressor 25 mg p.o. twice daily 8.  Protonix 40 mg p.o. daily 9.  Crestor 20 mg p.o. daily 10.  Trazodone 50 mg p.o. nightly   Discharge Instructions    Ambulatory referral to Neurology   Complete by: As directed    An appointment is requested in approximately 4 weeks left opercular infarction in the setting of aortic dissection   Ambulatory referral to Physical Medicine Rehab   Complete  by: As directed    Moderate complexity follow-up 1 to 2 weeks left opercular inferior infarction after aortic dissection      Follow-up Information    Lovorn, Aundra Millet, MD Follow up.   Specialty: Physical Medicine and Rehabilitation Why: Office to call for appointment Contact information: [address] [place] [address] [phone]        Linden Dolin, MD Follow up.   Specialty: Cardiothoracic Surgery Why: Call for appointment Contact information: [address] [place] [address] [place] [address] [phone]        Bensimhon, Bevelyn Buckles, MD Follow up.   Specialty: Cardiology Why: Call for appointment Contact information: [address] [place] [address] [phone]        Dyann Kief, [place]. Go on 05/28/2019.   Specialty: Cardiology Why: You have a cardiology follow up appointment with Jacolyn Reedy, PA-C on Monday 3.29 at 12:30pm.  Contact information: [address] [place] [address] [phone]           Signed: Mcarthur Rossetti [place] 05/15/2019, 5:16 AM

## 2019-05-13 NOTE — Progress Notes (Signed)
Physical Therapy Session Note  Patient Details  Name: Erik Parker MRN: [id number] Date of Birth: 03-06-1965  Today's Date: 05/13/2019 PT Individual Time: 0903-1000 PT Individual Time Calculation (min): 57 min   Short Term Goals: Week 1:  PT Short Term Goal 1 (Week 1): STG = LTG due to short ELOS.  Skilled Therapeutic Interventions/Progress Updates:    Patient supine in bed upon PT arrival, agreeable to therapy tx, denies pain. Bed mobility at mod-I level. Sit > stand with supervision performed consistently throughout session with use of heart pillow to maintain sternal precautions. Pt ambulated 150' to rehab gym with supervision. Pt performed standing balance on an airex with 4 consecutive cone taps x15 each LE to improve single leg stance and LE/core stability. Pt required CGA-minA d/t three LOB laterally when performing more lateral cone tap across midline, pt able to recover with only minA. Pt performed x 10 step ups each LE from airex > 6" step with airex on top of step without difficulty CGA provided for safety. Pt performed various gait patterns utilizing the agility ladder for balance and cognition, therapist encouraged to inc speed and providing supervision-CGA for safety, see below:   - 2 feet in each block: 2 steps forward, 1 step back x 2 reps  - 2 feet in each block going forward: L <> in block <> R x 2 reps  - 2 feet in each block side stepping: 1 step in ladder forward, 1 step out of ladder backwards x 2 reps each direction   Therapist provided seated rest breaks throughout session with reports of pt fatigue. Pt participated in reactionary stepping strategies in the parallel bars for safety: forward x 10, laterally R & L x 10 each, backwards x 10 with CGA for safety. Pt utilized one-step 90% of the time to recover balance, intermittently requiring two-steps to recover but no physical assistance from therapist. Pt ambulated throughout 4th floor and back to room at end of session  performing the following dynamic gait activities with supervison-CGA:   - head turns R/L  - head nods up/down  - retro gait  - side stepping, each direction  - eyes closed - grape vine, each direction   Pt was more verbal today throughout session as noted by another therapist present and demonstrated improved ability to word find. Pt reported having back pain after laying in bed for prolonged periods of time but it dissipated during physical therapy once moving. At end of session, pt left seated in w/c with needs in reach and chair alarm set.    Therapy Documentation Precautions:  Precautions Precautions: Sternal, Fall Precaution Comments: patient able to demonstrate understanding of sternal precautions, requires occ cues during functional task completion Restrictions Weight Bearing Restrictions: No Other Position/Activity Restrictions: sternal precautions   Therapy/Group: Individual Therapy  Blima Ledger SPT 05/13/2019, 7:49 AM

## 2019-05-14 ENCOUNTER — Inpatient Hospital Stay (HOSPITAL_COMMUNITY): Payer: 59 | Admitting: Speech Pathology

## 2019-05-14 ENCOUNTER — Inpatient Hospital Stay (HOSPITAL_COMMUNITY): Payer: 59

## 2019-05-14 ENCOUNTER — Inpatient Hospital Stay (HOSPITAL_COMMUNITY): Payer: 59 | Admitting: Occupational Therapy

## 2019-05-14 DIAGNOSIS — I639 Cerebral infarction, unspecified: Secondary | ICD-10-CM

## 2019-05-14 MED ORDER — TRAZODONE HCL 50 MG PO TABS: 50.0000 mg | ORAL_TABLET | Freq: Every day | ORAL | 0 refills | Status: DC

## 2019-05-14 MED ORDER — ROSUVASTATIN CALCIUM 20 MG PO TABS: 20.0000 mg | ORAL_TABLET | Freq: Every day | ORAL | 0 refills | Status: DC

## 2019-05-14 MED ORDER — METOPROLOL TARTRATE 25 MG PO TABS: 25.0000 mg | ORAL_TABLET | Freq: Two times a day (BID) | ORAL | 0 refills | Status: DC

## 2019-05-14 MED ORDER — COLCHICINE 0.6 MG PO TABS: 0.6000 mg | ORAL_TABLET | Freq: Two times a day (BID) | ORAL | 0 refills | Status: DC

## 2019-05-14 MED ORDER — AMANTADINE HCL 100 MG PO CAPS: 100.0000 mg | ORAL_CAPSULE | Freq: Every day | ORAL | 0 refills | Status: DC

## 2019-05-14 MED ORDER — TRAMADOL HCL 50 MG PO TABS: 50.0000 mg | ORAL_TABLET | ORAL | 0 refills | Status: DC | PRN

## 2019-05-14 MED ORDER — BISACODYL 5 MG PO TBEC: 10.0000 mg | DELAYED_RELEASE_TABLET | Freq: Every day | ORAL | 0 refills | Status: DC

## 2019-05-14 MED ORDER — FOLIC ACID 1 MG PO TABS: 1.0000 mg | ORAL_TABLET | Freq: Every day | ORAL | 0 refills | Status: DC

## 2019-05-14 MED ORDER — PANTOPRAZOLE SODIUM 40 MG PO TBEC: 40.0000 mg | DELAYED_RELEASE_TABLET | Freq: Every day | ORAL | 0 refills | Status: DC

## 2019-05-14 MED ORDER — ACETAMINOPHEN 325 MG PO TABS: 325.0000 mg | ORAL_TABLET | ORAL | Status: DC | PRN

## 2019-05-14 NOTE — Progress Notes (Signed)
[facility] PHYSICAL MEDICINE & REHABILITATION PROGRESS NOTE   Subjective/Complaints:  Sleep improved on trazodone 50mg     ROS:   Pt denies SOB, abd pain, CP, N/V/C/D, and vision changes   Objective:   No results found. No results for input(s): WBC, HGB, HCT, PLT in the last 72 hours. No results for input(s): NA, K, CL, CO2, GLUCOSE, BUN, CREATININE, CALCIUM in the last 72 hours.  Intake/Output Summary (Last 24 hours) at 05/14/2019 0849 Last data filed at 05/14/2019 0730 Gross per 24 hour  Intake 480 ml  Output 850 ml  Net -370 ml     Physical Exam: Vital Signs Blood pressure 105/62, pulse 61, temperature 98.5 F (36.9 C), temperature source Oral, resp. rate 18, height 6' (1.829 m), weight 107.2 kg, SpO2 99 %.  Physical Exam  Vitals reviewed.  General: No acute distress Mood and affect are appropriate Heart: Regular rate and rhythm no rubs murmurs or extra sounds Lungs: Clear to auscultation, breathing unlabored, no rales or wheezes Abdomen: Positive bowel sounds, soft nontender to palpation, nondistended  Musculoskeletal:     Comments: No edema or tenderness in extremities  Neurological: He is alert.  Patient is alert in no acute distress.   Makes eye contact with examiner.   Motor:5/5 in BUE and BLE  Able to SAY yes and no today- making gains with expressive aphasia Skin: Skin is warm and dry.  Sternal incision covered with betadine- healing well- no drainage/erythema Psychiatric: speaking a few words when repeats mainly.    Assessment/Plan: 1. Functional deficits secondary to R hemiparesis and aphasia and dysphagia due to  left opercular infarction, and aortic dissection which require 3+ hours per day of interdisciplinary therapy in a comprehensive inpatient rehab setting.  Physiatrist is providing close team supervision and 24 hour management of active medical problems listed below.  Physiatrist and rehab team continue to assess barriers to  discharge/monitor patient progress toward functional and medical goals  Care Tool:  Bathing    Body parts bathed by patient: Right arm, Left arm, Chest, Abdomen, Front perineal area, Buttocks, Right upper leg, Left upper leg, Right lower leg, Left lower leg, Face         Bathing assist Assist Level: Supervision/Verbal cueing     Upper Body Dressing/Undressing Upper body dressing   What is the patient wearing?: Pull over shirt    Upper body assist Assist Level: Minimal Assistance - Patient > 75%    Lower Body Dressing/Undressing Lower body dressing      What is the patient wearing?: Underwear/pull up, Pants     Lower body assist Assist for lower body dressing: Independent     Toileting Toileting    Toileting assist Assist for toileting: Independent Assistive Device Comment: (urinal)   Transfers Chair/bed transfer  Transfers assist     Chair/bed transfer assist level: Supervision/Verbal cueing     Locomotion Ambulation   Ambulation assist      Assist level: Supervision/Verbal cueing(supervision >90%, CGA during dynamic gait therpist's preference) Assistive device: No Device Max distance: 150'   Walk 10 feet activity   Assist     Assist level: Supervision/Verbal cueing Assistive device: No Device   Walk 50 feet activity   Assist    Assist level: Supervision/Verbal cueing Assistive device: No Device    Walk 150 feet activity   Assist    Assist level: Supervision/Verbal cueing Assistive device: No Device    Walk 10 feet on uneven surface  activity   Assist  Assist level: Supervision/Verbal cueing     Wheelchair     Assist Will patient use wheelchair at discharge?: No(patient is a functional ambulator)   Wheelchair activity did not occur: Safety/medical concerns(contraindicated due to sternal precautions)         Wheelchair 50 feet with 2 turns activity    Assist    Wheelchair 50 feet with 2 turns activity  did not occur: Safety/medical concerns       Wheelchair 150 feet activity     Assist  Wheelchair 150 feet activity did not occur: Safety/medical concerns       Blood pressure 105/62, pulse 61, temperature 98.5 F (36.9 C), temperature source Oral, resp. rate 18, height 6' (1.829 m), weight 107.2 kg, SpO2 99 %.   Medical Problem List and Plan: 1.  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  3/12- set d/c date as next Tuesday- 05/15/19 as d/c date       looks on target  2.  Antithrombotics: -DVT/anticoagulation: SCDs. Walking a lot.    3/14- pt assured me didn't need tramadol but encouraged if impacts therapy since wincing upon palpation.  4. Mood: Provide emotional support             -antipsychotic agents: N/A 5. Neuropsych: This patient is capable of making decisions on his own behalf. 6. Skin/Wound Care: Routine skin checks 7. Fluids/Electrolytes/Nutrition: Routine in and outs.  CMP ordered. 8.  Post stroke dysphagia.  Dysphagia #1 nectar thick liquids.  Follow-up speech therapy  3/11- pt asking about getting Cortrak out- explained need OK from SLP- he understands.   3/12- cortrak out- diet upgraded to regular diet with thin liquids.  9.  Hypertension/V. tach.  Lopressor 25 mg twice daily.  3/11- well controlled- con't meds  3/13- BP well controlled- 120s-130s SBP- con't meds    Follow-up cardiology services             Monitor with increased mobility 10.  Postoperative pericarditis.  Continue colchicine 11.  Tobacco abuse.  Counsel  3/11- pt reports doesn't plan on smoking again- will con't counseling.   3/14- pt assures me won't smoke again, but was long  habit- needs encouragement to stop.  12.  Hyperlipidemia.  Crestor 13. Expressive aphasia  3/12- will try Amantadine 100 mg daily to see if will improve speech output.   3/13- pt has no side effects- will con't- making speech gains.   3/14- pt now repeating interviewer somewhat- doing better daily.  14. Insomnia  3/14- added trazodone 50 mg QHS 15. Dispo  3/16- d/c Tuesday - per Dr Berline Chough  LOS: 5 days A FACE TO FACE EVALUATION WAS PERFORMED  Erick Colace 05/14/2019, 8:49 AM

## 2019-05-14 NOTE — Progress Notes (Signed)
Physical Therapy Discharge Summary  Patient Details  Name: Erik Parker MRN: [id number] Date of Birth: 1966/02/11  Today's Date: 05/14/2019 PT Individual Time: 1103-1205 PT Individual Time Calculation (min): 62 min    Patient has met 12 of 12 long term goals due to improved activity tolerance, improved balance and improved postural control.  Patient to discharge at an ambulatory level Independent.   Patient's care partner is independent to provide the necessary cognitive assistance for communication at discharge.  Recommendation:  Patient will benefit from ongoing skilled PT services in outpatient setting to continue to advance safe functional mobility, address ongoing impairments in dynamic balance, gait, and activity tolerance to return to work duties, and minimize fall risk.  Equipment: No equipment provided  Reasons for discharge: treatment goals met  Patient/family agrees with progress made and goals achieved: Yes  Skilled Therapeutic Interventions: Patient in bed upon PT arrival. Patient alert and agreeable to PT session. Patient reported 2/10 sternal pain during rolling in session. PT provided repositioning, rest breaks, and distraction as pain interventions throughout session.   Therapeutic Activity: Bed Mobility: Patient performed supine to/from sit independently in the ADL bed maintaining sternal precautions without cues.  Transfers: Patient performed sit to/from stand transfer independently throughout session from various surfaces, including furniture transfers in the ADL apartment.  Patient performed a simulated sedan height car transfer independently. Educated on turning off the passenger air bag due to sternal incision for safety. Patient stated understanding. Patient maintained sternal precautions with all transfers without cues. Five times Sit to Stand Test (FTSS) Method: Use a straight back chair with a solid seat that is 16-18" high. Ask participant to sit on the  chair with arms folded across their chest.   Instructions: "Stand up and sit down as quickly as possible 5 times, keeping your arms folded across your chest."   Measurement: Stop timing when the participant stands the 5th time.  TIME: ___7.59___ (in seconds)  Times > 13.6 seconds is associated with increased disability and morbidity (Guralnik, 2000) Times > 15 seconds is predictive of recurrent falls in healthy individuals aged 83 and older (Buatois, et al., 2008) Normal performance values in community dwelling individuals aged 32 and older (Bohannon, 2006): o 60-69 years: 11.4 seconds o 70-79 years: 12.6 seconds o 80-89 years: 14.8 seconds  MCID: ? 2.3 seconds for Vestibular Disorders (Meretta, 2006)  Gait Training:  Patient ambulated independently around the unit without an AD. Ambulated with decreased trunk rotation and arm swing, no other gait deviations noted.  Patient ambulated up/down a ramp, over 10 feet of mulch (unlevel surface), and up/down a curb to simulate community ambulation over unlevel surfaces independently without an AD.  6 Min Walk Test:  Instructed patient to ambulate as quickling and as safely as possible for 6 minutes using LRAD. Patient was allowed to take standing rest breaks without stopping the test, but if he required a sitting rest break the clock would be stopped and the test would be over.  Results: 3300 feet independently without an AD  Neuromuscular Re-ed: Patient performed the following dynamic balance and gait assessments: Patient performed the [facility] Scale, see details below. He demonstrates increased fall risk as noted by score of   56/56 on Berg Balance Scale.  (<36= high risk for falls, close to 100%; 37-45 significant >80%; 46-51 moderate >50%; 52-55 lower >25%)  Patient performed the Functional Gait Assessment, see details below. Score 30/30. Socre <19 indicated increased fall risk with dynamic gait.   Patient  sitting EOB with his wife in  the room at end of session with breaks locked and all needs within reach. Educated patient and his wife on fall risk/prevention, home modifications to prevent falls, and activation of emergency services in the event of a fall during session. Discussed daily BP assessment and signs and symptoms of elevated BP. Encouraged them to talk to the medical team about safe BP ranges following surgery. Discussed patient's progress and assessment results wit perfect scores today. Patient and his wife very pleased with his progress. Recommended that patient does not need follow-up PT at this time and focus efforts of speech recovery. Educated on returning to light exercises routine without UE exercises to include squats and walking daily to increased endurance. His wife asked about when patient would be cleared to return to sexual activity. PT differed clearance to be clarified with the medical team and provided education on safe positioning that would maintain sternal precautions and to ease into activity level per MD recommendations. Patient and his wife very appreciative and receptive to all education.    PT Discharge Precautions/Restrictions Precautions Precautions: Sternal Restrictions Weight Bearing Restrictions: Yes RUE Weight Bearing: Partial weight bearing RUE Partial Weight Bearing Percentage or Pounds: <10 pounds LUE Weight Bearing: Partial weight bearing LUE Partial Weight Bearing Percentage or Pounds: <10 pounds Vision/Perception  Vision - Assessment Eye Alignment: Within Functional Limits Ocular Range of Motion: Within Functional Limits Alignment/Gaze Preference: Within Defined Limits Tracking/Visual Pursuits: Able to track stimulus in all quads without difficulty Saccades: Within functional limits Convergence: Impaired (comment)(decreased convergence on L, reports this may be baseline) Perception Perception: Within Functional Limits Praxis Praxis: Intact  Cognition Overall Cognitive  Status: Within Functional Limits for tasks assessed Arousal/Alertness: Awake/alert Orientation Level: Oriented X4 Safety/Judgment: Appears intact Sensation Sensation Light Touch: Impaired by gross assessment Peripheral sensation comments: 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 2-4 Light Touch Impaired Details: Impaired RUE;Impaired LUE Proprioception: Appears Intact Coordination Gross Motor Movements are Fluid and Coordinated: Yes Fine Motor Movements are Fluid and Coordinated: Yes Finger Nose Finger Test: WFL bilaterally Heel Shin Test: [facility] bilaterally Motor  Motor Motor: Within Functional Limits Motor - Discharge Observations: mild right UE motor impairment resolved, currently only limited by sternal precautions  Mobility Bed Mobility Bed Mobility: Rolling Right;Rolling Left;Supine to Sit;Sit to Supine Rolling Right: Independent Rolling Left: Independent Supine to Sit: Independent Sit to Supine: Independent Transfers Transfers: Sit to Stand;Stand to Sit;Stand Pivot Transfers Sit to Stand: Independent Stand to Sit: Independent Stand Pivot Transfers: Independent Transfer (Assistive device): None Locomotion  Gait Ambulation: Yes Gait Assistance: Independent Gait Distance (Feet): 1432 Feet Assistive device: None Gait Gait: Yes Gait Pattern: Within Functional Limits;Decreased trunk rotation Gait velocity: 1.21 m/s Stairs / Additional Locomotion Stairs: Yes Stairs Assistance: Independent Stair Management Technique: No rails Number of Stairs: 22 Height of Stairs: 6 Ramp: Independent Curb: Independent Product manager 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, petroleum)  Trunk/Postural Assessment  Cervical Assessment Cervical Assessment: Within Functional Limits Thoracic Assessment Thoracic Assessment: Within Functional Limits Lumbar Assessment Lumbar Assessment: Within Functional Limits Postural Control Postural Control: Within  Functional Limits  Balance Standardized Balance Assessment Standardized Balance Assessment: Berg Balance Test Berg Balance Test Sit to Stand: Able to stand without using hands and stabilize independently Standing Unsupported: Able to stand safely 2 minutes Sitting with Back Unsupported but Feet Supported on Floor or Stool: Able to sit safely and securely 2 minutes Stand to Sit: Sits safely with minimal use of  hands Transfers: Able to transfer safely, minor use of hands Standing Unsupported with Eyes Closed: Able to stand 10 seconds safely Standing Ubsupported with Feet Together: Able to place feet together independently and stand 1 minute safely From Standing, Reach Forward with Outstretched Arm: Can reach confidently >25 cm (10") From Standing Position, Pick up Object from Floor: Able to pick up shoe safely and easily(limited by back pain/not balance) From Standing Position, Turn to Look Behind Over each Shoulder: Looks behind from both sides and weight shifts well Turn 360 Degrees: Able to turn 360 degrees safely in 4 seconds or less Standing Unsupported, Alternately Place Feet on Step/Stool: Able to stand independently and safely and complete 8 steps in 20 seconds Standing Unsupported, One Foot in Front: Able to place foot tandem independently and hold 30 seconds Standing on One Leg: Able to lift leg independently and hold > 10 seconds Total Score: 56 Static Sitting Balance Static Sitting - Level of Assistance: 7: Independent Dynamic Sitting Balance Dynamic Sitting - Level of Assistance: 7: Independent Static Standing Balance Static Standing - Level of Assistance: 7: Independent Dynamic Standing Balance Dynamic Standing - Level of Assistance: 7: Independent Functional Gait  Assessment Gait assessed : Yes Gait Level Surface: Walks 20 ft in less than 5.5 sec, no assistive devices, good speed, no evidence for imbalance, normal gait pattern, deviates no more than 6 in outside of the 12  in walkway width. Change in Gait Speed: Able to smoothly change walking speed without loss of balance or gait deviation. Deviate no more than 6 in outside of the 12 in walkway width. Gait with Horizontal Head Turns: Performs head turns smoothly with no change in gait. Deviates no more than 6 in outside 12 in walkway width Gait with Vertical Head Turns: Performs head turns with no change in gait. Deviates no more than 6 in outside 12 in walkway width. Gait and Pivot Turn: Pivot turns safely within 3 sec and stops quickly with no loss of balance. Step Over Obstacle: Is able to step over 2 stacked shoe boxes taped together (9 in total height) without changing gait speed. No evidence of imbalance. Gait with Narrow Base of Support: Is able to ambulate for 10 steps heel to toe with no staggering. Gait with Eyes Closed: Walks 20 ft, no assistive devices, good speed, no evidence of imbalance, normal gait pattern, deviates no more than 6 in outside 12 in walkway width. Ambulates 20 ft in less than 7 sec. Ambulating Backwards: Walks 20 ft, no assistive devices, good speed, no evidence for imbalance, normal gait Steps: Alternating feet, no rail. Total Score: 30 Extremity Assessment  RUE Assessment General Strength Comments: AROM intact to 90 degrees proximal, WNL distal, no resistance due to sternal precautions, hx of carpal tunnel LUE Assessment General Strength Comments: AROM intact to 90 degrees proximal, WNL distal, no resistance due to sternal precautions, hx of carpal tunnel RLE Assessment RLE Assessment: Within Functional Limits General Strength Comments: Grossly 5/5 throughout LLE Assessment LLE Assessment: Within Functional Limits General Strength Comments: Grossly 5/5 throughout    Charnette Younkin L Obinna Ehresman PT, DPT  05/14/2019, 11:51 AM

## 2019-05-14 NOTE — Progress Notes (Signed)
Patient is w/o complaint or noted acute distress throughout shift, refer to assessment data form , call bell and bed alarm in place , continue regime

## 2019-05-14 NOTE — Discharge Instructions (Signed)
Inpatient Rehab Discharge Instructions  Erik Parker Discharge date and time: No discharge date for patient encounter.   Activities/Precautions/ Functional Status: Activity: activity as tolerated Diet:  Wound Care: none needed Functional status:  ___ No restrictions     ___ Walk up steps independently ___ 24/7 supervision/assistance   ___ Walk up steps with assistance ___ Intermittent supervision/assistance  ___ Bathe/dress independently ___ Walk with walker     _x__ Bathe/dress with assistance ___ Walk Independently    ___ Shower independently ___ Walk with assistance    ___ Shower with assistance ___ No alcohol     ___ Return to work/school ________   COMMUNITY REFERRALS UPON DISCHARGE:     Outpatient: PT     OT    ST             Agency: [facility] Phone: [phone]              Appointment Date/Time: *Please expect follow-up within 3-5 business days to schedule your appointment. If you have not received follow-up, be sure to contact the rehab center.   Medical Equipment/Items Ordered: recommended 3in1 bedside commode (family to purchase)                                                    Special Instructions: No driving smoking or alcohol STROKE/TIA DISCHARGE INSTRUCTIONS SMOKING Cigarette smoking nearly doubles your risk of having a stroke & is the single most alterable risk factor  If you smoke or have smoked in the last 12 months, you are advised to quit smoking for your health.  Most of the excess cardiovascular risk related to smoking disappears within a year of stopping.  Ask you doctor about anti-smoking medications  [place] Quit Line: 1-800-QUIT NOW  Free Smoking Cessation Classes [phone]  CHOLESTEROL Know your levels; limit fat & cholesterol in your diet  Lipid Panel     Component Value Date/Time   CHOL 148 05/04/2019 0621   TRIG 130 05/04/2019 1711   HDL 38 (L) 05/04/2019 0621   CHOLHDL 3.9 05/04/2019 0621   VLDL 22 05/04/2019 0621   LDLCALC 88  05/04/2019 0621      Many patients benefit from treatment even if their cholesterol is at goal.  Goal: Total Cholesterol (CHOL) less than 160  Goal:  Triglycerides (TRIG) less than 150  Goal:  HDL greater than 40  Goal:  LDL (LDLCALC) less than 100   BLOOD PRESSURE American Stroke Association blood pressure target is less that 120/80 mm/Hg  Your discharge blood pressure is:  BP: 115/73  Monitor your blood pressure  Limit your salt and alcohol intake  Many individuals will require more than one medication for high blood pressure  DIABETES (A1c is a blood sugar average for last 3 months) Goal HGBA1c is under 7% (HBGA1c is blood sugar average for last 3 months)  Diabetes: No known diagnosis of diabetes    Lab Results  Component Value Date   HGBA1C 5.6 05/04/2019     Your HGBA1c can be lowered with medications, healthy diet, and exercise.  Check your blood sugar as directed by your physician  Call your physician if you experience unexplained or low blood sugars.  PHYSICAL ACTIVITY/REHABILITATION Goal is 30 minutes at least 4 days per week  Activity: Increase activity slowly, Therapies: Physical Therapy: Home  Health Return to work:   Activity decreases your risk of heart attack and stroke and makes your heart stronger.  It helps control your weight and blood pressure; helps you relax and can improve your mood.  Participate in a regular exercise program.  Talk with your doctor about the best form of exercise for you (dancing, walking, swimming, cycling).  DIET/WEIGHT Goal is to maintain a healthy weight  Your discharge diet is:  Diet Order            DIET - DYS 1 Room service appropriate? Yes with Assist; Fluid consistency: Nectar Thick  Diet effective now              liquids Your height is:  Height: 6' (182.9 cm) Your current weight is: Weight: 105.9 kg Your Body Mass Index (BMI) is:  BMI (Calculated): 31.66  Following the type of diet specifically designed for  you will help prevent another stroke.  Your goal weight range is:    Your goal Body Mass Index (BMI) is 19-24.  Healthy food habits can help reduce 3 risk factors for stroke:  High cholesterol, hypertension, and excess weight.  RESOURCES Stroke/Support Group:  Call [phone]   STROKE EDUCATION PROVIDED/REVIEWED AND GIVEN TO PATIENT Stroke warning signs and symptoms How to activate emergency medical system (call 911). Medications prescribed at discharge. Need for follow-up after discharge. Personal risk factors for stroke. Pneumonia vaccine given:  Flu vaccine given:  My questions have been answered, the writing is legible, and I understand these instructions.  I will adhere to these goals & educational materials that have been provided to me after my discharge from the hospital.      My questions have been answered and I understand these instructions. I will adhere to these goals and the provided educational materials after my discharge from the hospital.  Patient/Caregiver Signature _______________________________ Date __________  Clinician Signature _______________________________________ Date __________  Please bring this form and your medication list with you to all your follow-up doctor's appointments.

## 2019-05-14 NOTE — Progress Notes (Signed)
Social Work Assessment and Plan   Patient Details  Name: Erik Parker MRN: [id number] Date of Birth: 10/19/1965  Today's Date: 05/14/2019  Problem List:  Patient Active Problem List   Diagnosis Date Noted  . Aphasia as late effect of cerebrovascular accident (CVA) 05/10/2019  . Dysphagia 05/10/2019  . History of open heart surgery   . Pericarditis   . Acute CVA (cerebrovascular accident) ([place])   . Essential hypertension   . Dyslipidemia   . Tobacco abuse   . Tachypnea   . Leukocytosis   . Cerebral embolism with cerebral infarction 05/03/2019  . Aortic dissection ([place]) 05/01/2019  . 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 Bilateral hand numbness 02/26/2019  . Decreased grip strength 02/26/2019   Past Medical History:  Past Medical History:  Diagnosis Date  . Hyperlipidemia   . Hypertension    Past Surgical History:  Past Surgical History:  Procedure Laterality Date  . ACHILLES TENDON SURGERY Right 11/10/2016   Procedure: ACHILLES TENDON REPAIR VIA SPEED BRIDGE;  Surgeon: [name], DPM;  Location: [facility];  Service: Podiatry;  Laterality: Right;  . HEEL SPUR RESECTION Right 11/10/2016   Procedure: HEEL SPUR RESECTION (HAGLUNDS DEFORMITY);  Surgeon: [name], DPM;  Location: [facility];  Service: Podiatry;  Laterality: Right;  . REPAIR OF ACUTE ASCENDING THORACIC AORTIC DISSECTION N/A 05/01/2019   Procedure: REPAIR OF ACUTE ASCENDING THORACIC AORTIC DISSECTION USING HEMOSHIELD GRAFT SIZE 28MM;  Surgeon: Wonda Olds, MD;  [facility];  Service: Cardiothoracic;  Laterality: N/A;   Social History:  reports that he has been smoking cigarettes. He has a 4.50 pack-year smoking history. He has never used smokeless tobacco. He reports current alcohol use. He reports that he does not use drugs.  Family / Support Systems Marital Status: Single Patient Roles: Parent, Partner Spouse/Significant Other: Ronald Pippins [phone] Children: 1 adult son  52 y.o. Other Supports: Mother Anticipated Caregiver: Pt s/o Ability/Limitations of Caregiver: None reported Caregiver Availability: 24/7 Family Dynamics: Pt lives with s/o and adult son. Pt wife works 3rd shift 1:30am-6:30am.  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 Education: high school grad Read: Yes Write: Yes Employment Status: Employed Length of Employment: 14 Return to Work Plans: Plans to return to work when able Public relations account executive Issues: Denies Guardian/Conservator: N/A   Abuse/Neglect Abuse/Neglect Assessment Can Be Completed: Yes Physical Abuse: Denies Verbal Abuse: Denies Sexual Abuse: Denies Exploitation of patient/patient's resources: Denies Self-Neglect: Denies  Emotional Status Pt's affect, behavior and adjustment status: Pt adjusting to medical condition Recent Psychosocial Issues: None Psychiatric History: N/A Substance Abuse History: Denies recrational drug use. Admits to smoking atleast 12 cigarettes per day but has quit since admission; admits to drinking beer- 3 20oz beer per week.  Patient / Family Perceptions, Expectations & Goals Pt/Family understanding of illness & functional limitations: Pt and wife have general understanding of pt medical condition Premorbid pt/family roles/activities: Pt and wife managed finances and housekeeping needs Anticipated changes in roles/activities/participation: Wife will manage finances and other needs until pt can resume  [employer]: None Premorbid Home Care/DME Agencies: None Transportation available at discharge: wife to d/c home  Discharge Planning Living Arrangements: Spouse/significant other, Children Support Systems: Spouse/significant other, Children, Parent Type of Residence: Private residence Insurance Resources: Multimedia programmer (specify)([employer]) Financial  Resources: Other (Comment)(STD/LTD to begin) Financial Screen Referred: No Living Expenses: Own Money Management: Patient, Spouse Does the patient have any  problems obtaining your medications?: No Home Management: See above Social Work Anticipated Follow Up Needs: HH/OP Expected length of stay: 4-6 days  Clinical Impression SW met with pt in room to introduce self, explain role, and discuss discharge process. Pt able to communicate by saying some words, and if unable, used notebook to write answers.   [name] A [name] 05/14/2019, 10:46 AM

## 2019-05-14 NOTE — Progress Notes (Addendum)
Social Work Patient ID: Erik Parker, male   DOB: [birth year], 54 y.o.   MRN: [id number]   SW called pt s/o Erik Parker [phone]) to complete assessment and give d/c updates, however, voicemail full. SW will continue to make efforts.   SW met with pt in room to discuss challenges with connecting with his s/o. Pt uses notebook to communicate if unable to get out words. States wrong number for s/o  As she lost her phone, [phone]- which is his number listed in [place]. Pt states she should be here at 10am today. SW informed pt on DME recs: outpatient therapy PT/OT/ST and 3in1 BSC. SW informed pt this item will be ordered. SW completed assessment with pt. SW to enter.   SW spoke with pt s/o Erik Parker to discuss d/c recommendations: 3in1 BSC and OPT PT//OT/ST. States the closet location would be [facility]. She does intend to be here today but is running behind. SW reviewed d/c process. She states that she will be transporting him to/from his medical appointments. SW sent referral to [facility].   SW faxed DME order: 3in1 BSC to [facility] (p:[phone]/f:[phone]). SW faxed Outpatient PT/OT/ST order to [facility] (p:[phone]/f:[phone]).  *Erik Parker with Erik Parker states pt will have out-of-network cost for DME item 3in1 BSC. SW called pt wife to inform on various DME companies not carrying item, and/or can purchase from local pharmacy as a private pay item. Pt wife states she will purchase from local pharmacy tomorrow. SW spoke with Erik Parker who confirms referral received for [facility] and will follow-up with pt to schedule. SW informed that PT states pt has progressed and did not think it was needed. States will make a note, and will still schedule therapies and to follow-up with physician if it is needed.   Erik Parker, MSW, [place] Office: [phone] Cell: [phone] Fax: [phone]

## 2019-05-14 NOTE — Progress Notes (Signed)
Speech Language Pathology Discharge Summary  Patient Details  Name: Erik Parker MRN: [id number] Date of Birth: [birth year]  Today's Date: 05/14/2019 SLP Individual Time: 1330-1410 SLP Individual Time Calculation (min): 40 min   Skilled Therapeutic Interventions:  Skilled treatment session focused on speech goals. SLP facilitated session by providing Mod-Max A articulatory, verbal and visual cues for patient to self-monitor and correct phonemic errors at the word level. Patient named functional items at the word level within pictures during a barrier task with his wife in which he was 75% intelligible. Patient also read aloud at the phrase level with 75% intelligibility with inability to produce only 1 word. Both the patient and his wife were educated in regards to tasks and activities the patient can participate in at home to maximize intelligibility. Patient left upright in bed with wife present.   Patient has met 4 of 4 long term goals.  Patient to discharge at overall Min;Max level.   Reasons goals not met: N/A   Clinical Impression/Discharge Summary: Patient has made functional gains and has met 4 of 4 LTGs this admission. Currently, patient is consuming regular textures with thin liquids with minimal overt s/s of aspiration and overall Mod I for use of swallowing compensatory strategies. Patient continues to demonstrate severe speech deficits impacting speech intelligibility at the word level, however, patient utilizes multimodal communication like gestures and written expression to communicate wants/needs with overall Min A verbal cues. Patient and family education is complete and patient will discharge home with assistance from family. Patient would benefit from outpatient SLP services to maximize his speech intelligibility and overall functional communication.   Care Partner:  Caregiver Able to Provide Assistance: Yes     Recommendation:  Outpatient SLP  Rationale for SLP Follow Up:  Maximize functional communication;Reduce caregiver burden   Equipment: N/A   Reasons for discharge: Discharged from hospital;Treatment goals met   Patient/Family Agrees with Progress Made and Goals Achieved: Yes    Ihan Pat 05/14/2019, 6:34 AM

## 2019-05-14 NOTE — Plan of Care (Signed)
  Problem: Consults Goal: RH STROKE PATIENT EDUCATION Description: See Patient Education module for education specifics  Outcome: Progressing Goal: Nutrition Consult-if indicated Outcome: Progressing   Problem: RH BOWEL ELIMINATION Goal: RH STG MANAGE BOWEL WITH ASSISTANCE Description: STG Manage Bowel with  mod Assistance. Outcome: Progressing   Problem: RH BLADDER ELIMINATION Goal: RH STG MANAGE BLADDER WITH ASSISTANCE Description: STG Manage Bladder With mod Assistance Outcome: Progressing   Problem: RH SKIN INTEGRITY Goal: RH STG SKIN FREE OF INFECTION/BREAKDOWN Description: Skin to remain free from infection and breakdown while on rehab with min assist. Outcome: Progressing   Problem: RH SAFETY Goal: RH STG ADHERE TO SAFETY PRECAUTIONS W/ASSISTANCE/DEVICE Description: STG Adhere to Safety Precautions With mod Assistance and appropriate assistive Device. Outcome: Progressing   Problem: RH PAIN MANAGEMENT Goal: RH STG PAIN MANAGED AT OR BELOW PT'S PAIN GOAL Description: <3 on a 0-10 pain scale Outcome: Progressing   Problem: RH KNOWLEDGE DEFICIT Goal: RH STG INCREASE KNOWLEDGE OF HYPERTENSION Description: Patient will be able to demonstrate knowledge of HTN medications, dietary management, BP parameters, and follow up care with the MD post discharge with min assist from staff. Outcome: Progressing Goal: RH STG INCREASE KNOWLEDGE OF DYSPHAGIA/FLUID INTAKE Description: Patient will be able to demonstrate knowledge of crushing medications, and giving them with proper consistency,how to thicken liquids to the proper consistency, and follow up care with the MD post discharge with min assist from staff.  Outcome: Progressing Goal: RH STG INCREASE KNOWLEGDE OF HYPERLIPIDEMIA Description: Patient will be able to demonstrate knowledge of HLD medications, dietary management, and follow up care with the MD post discharge with min assist from staff.  Outcome: Progressing Goal: RH  STG INCREASE KNOWLEDGE OF STROKE PROPHYLAXIS Description: Patient will be able to demonstrate knowledge of stroke prevention medications, dietary management, and follow up care with the MD post discharge with min assist from staff.  Outcome: Progressing

## 2019-05-14 NOTE — Care Management (Signed)
[facility] Individual Statement of Services  Patient Name:  Erik Parker  Date:  05/14/2019  Welcome to the [facility].  Our goal is to provide you with an individualized program based on your diagnosis and situation, designed to meet your specific needs.  With this comprehensive rehabilitation program, you will be expected to participate in at least 3 hours of rehabilitation therapies Monday-Friday, with modified therapy programming on the weekends.  Your rehabilitation program will include the following services:  Physical Therapy (PT), Occupational Therapy (OT), Speech Therapy (ST), 24 hour per day rehabilitation nursing, Therapeutic Recreaction (TR), Neuropsychology, Case Management (Social Worker), Rehabilitation Medicine, Nutrition Services, Pharmacy Services and Other  Weekly team conferences will be held on Tuesdays to discuss your progress.  Your Social Worker will talk with you frequently to get your input and to update you on team discussions.  Team conferences with you and your family in attendance may also be held.  Expected length of stay: 4-6 days    Overall anticipated outcome: Independent   Depending on your progress and recovery, your program may change. Your Social Worker will coordinate services and will keep you informed of any changes. Your Social Worker's name and contact numbers are listed  below.  The following services may also be recommended but are not provided by the [facility]:   Driving Evaluations  Home Health Rehabiltiation Services  Outpatient Rehabilitation Services  Vocational Rehabilitation   Arrangements will be made to provide these services after discharge if needed.  Arrangements include referral to agencies that provide these services.  Your insurance has been verified to be: Community education officer  Your primary doctor is:  [employer]  Pertinent information will be shared with your  doctor and your insurance company.  Social Worker:  Cecile Sheerer, [facility]  Information discussed with and copy given to patient by: Gretchen Short, 05/14/2019, 9:08 AM

## 2019-05-14 NOTE — Progress Notes (Signed)
Occupational Therapy Discharge Summary  Patient Details  Name: Erik Parker MRN: [id number] Date of Birth: November 05, 1965  Today's Date: 05/14/2019 OT Individual Time: 8299-3716 & 1212-1224 OT Individual Time Calculation (min): 45 min & 12 min  Patient participated in reassessment for discharge as documented below - he has demonstrated improved motor control/coordination right UE, improved balance, activity tolerance, carryover of sternal precautions with reach and transport of items and during adl tasks.  He completed independent ambulation on unit, good balance with challenges, reviewed activities at home to promote ongoing progress.  Met with patient and wife at lunchtime to review progress to date, set up needs for iadl tasks due to sternal precautions, DME, and follow up recommendations.  Both patient and wife demonstrate good understanding and readiness for discharge to home tomorrow.   Patient has met 8 of 8 long term goals due to improved activity tolerance, improved balance and improved coordination.  Patient to discharge at overall Modified Independent level.  Patient's care partner is independent to provide the necessary physical assistance at discharge.    Reasons goals not met: na   Recommendation:  Patient will benefit from ongoing skilled OT services in outpatient setting to continue to advance functional skills in the area of BADL and iADL. And eventual return to work and leisure activities  Equipment: commode  Reasons for discharge: treatment goals met  Patient/family agrees with progress made and goals achieved: Yes  OT Discharge Precautions/Restrictions  Precautions Precautions: Sternal Precaution Booklet Issued: Yes (comment) Precaution Comments: patient able to demonstrate understanding of sternal precautions Restrictions Weight Bearing Restrictions: Yes RUE Partial Weight Bearing Percentage or Pounds: <10 pounds LUE Partial Weight Bearing Percentage or Pounds: <10  pounds Other Position/Activity Restrictions: sternal precautions General   Vital Signs Therapy Vitals Temp: 98.9 F (37.2 C) Temp Source: Oral Pulse Rate: 62 Resp: 18 BP: 134/69 Patient Position (if appropriate): Lying Oxygen Therapy SpO2: 98 % O2 Device: Room Air Pain Pain Assessment Pain Scale: 0-10 Pain Score: 0-No pain ADL ADL Eating: Independent Where Assessed-Eating: Wheelchair Grooming: Independent Where Assessed-Grooming: Standing at sink Upper Body Bathing: Independent Where Assessed-Upper Body Bathing: Chair Lower Body Bathing: Independent Where Assessed-Lower Body Bathing: Chair Upper Body Dressing: Independent Where Assessed-Upper Body Dressing: Chair Lower Body Dressing: Modified independent Where Assessed-Lower Body Dressing: Chair Toileting: Independent Where Assessed-Toileting: Risk analyst Method: Counselling psychologist: Raised toilet seat ADL Comments: not yet cleared to shower, demonstrates good understanding and safety with sternal precautions Vision Baseline Vision/History: Wears glasses Wears Glasses: Reading only Patient Visual Report: No change from baseline Perception  Perception: Within Functional Limits Praxis Praxis: Intact Cognition Overall Cognitive Status: Within Functional Limits for tasks assessed Arousal/Alertness: Awake/alert Orientation Level: Oriented X4 Safety/Judgment: Appears intact Sensation Sensation Peripheral sensation comments: 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 2-4 Light Touch Impaired Details: Impaired RUE;Impaired LUE Coordination Gross Motor Movements are Fluid and Coordinated: Yes Fine Motor Movements are Fluid and Coordinated: Yes Finger Nose Finger Test: [facility] bilaterally 9 Hole Peg Test: L = 18 sec, R = 32 sec...Marland KitchenMarland KitchenMarland Kitchen   box and blocks    L = 70, R = 62     Mobility  Bed Mobility Rolling Right: Independent Rolling Left:  Independent Supine to Sit: Independent Sit to Supine: Independent Transfers Sit to Stand: Independent Stand to Sit: Independent  Trunk/Postural Assessment  Postural Control Postural Control: Within Functional Limits  Balance Static Sitting Balance Static Sitting - Level of Assistance:  7: Independent Dynamic Sitting Balance Dynamic Sitting - Level of Assistance: 7: Independent Static Standing Balance Static Standing - Level of Assistance: 7: Independent Dynamic Standing Balance Dynamic Standing - Level of Assistance: 7: Independent Extremity/Trunk Assessment RUE Assessment General Strength Comments: AROM intact to 90 degrees proximal, WNL distal, no resistance due to sternal precautions, hx of carpal tunnel LUE Assessment General Strength Comments: AROM intact to 90 degrees proximal, WNL distal, no resistance due to sternal precautions, hx of carpal tunnel   Carlos Levering 05/14/2019, 4:09 PM

## 2019-05-15 MED ORDER — AMANTADINE HCL 100 MG PO CAPS: 200.0000 mg | ORAL_CAPSULE | Freq: Every day | ORAL | 1 refills | Status: DC

## 2019-05-15 MED ORDER — AMANTADINE HCL 100 MG PO CAPS
200.0000 mg | ORAL_CAPSULE | Freq: Every day | ORAL | Status: DC
  Administered 2019-05-15: 200 mg via ORAL
  Filled 2019-05-15: qty 2

## 2019-05-15 NOTE — Progress Notes (Signed)
[place] PHYSICAL MEDICINE & REHABILITATION PROGRESS NOTE   Subjective/Complaints:    Ride here at 10am per pt.  Hurts sternum when sneezes.      ROS:    Pt denies SOB, abd pain, CP, N/V/C/D, and vision changes    Objective:   No results found. No results for input(s): WBC, HGB, HCT, PLT in the last 72 hours. No results for input(s): NA, K, CL, CO2, GLUCOSE, BUN, CREATININE, CALCIUM in the last 72 hours.  Intake/Output Summary (Last 24 hours) at 05/15/2019 0845 Last data filed at 05/15/2019 0456 Gross per 24 hour  Intake 560 ml  Output 575 ml  Net -15 ml     Physical Exam: Vital Signs Blood pressure 116/74, pulse 65, temperature 97.7 F (36.5 C), temperature source Oral, resp. rate 18, height 6' (1.829 m), weight 107.2 kg, SpO2 98 %.  Physical Exam  Vitals reviewed. Constitutional: pt talking much more- it's slow but understandable usually- saying 1 word at a time slowly. NAD HENT:  Head:.conjugate gaze Respiratory: CTA B/L- good air movement CV: RRR no JVD GI: soft, NT< ND, (+)BS Musculoskeletal:     Comments: No edema or tenderness in extremities  Neurological: He is alert.  Patient is alert in no acute distress.   Makes eye contact with examiner.   Motor: 4+/5 throughout Able to SAY yes and no today- making gains with expressive aphasia Skin: Skin is warm and dry.  Sternal incision looking great- no drainage; healing.  Psychiatric: speaking a few words when repeats mainly.    Assessment/Plan: 1. Functional deficits secondary to R hemiparesis and aphasia and dysphagia due to  left opercular infarction, and aortic dissection which require 3+ hours per day of interdisciplinary therapy in a comprehensive inpatient rehab setting.  Physiatrist is providing close team supervision and 24 hour management of active medical problems listed below.  Physiatrist and rehab team continue to assess barriers to discharge/monitor patient progress toward functional and  medical goals  Care Tool:  Bathing    Body parts bathed by patient: Right arm, Left arm, Chest, Abdomen, Front perineal area, Buttocks, Right upper leg, Left upper leg, Right lower leg, Left lower leg, Face         Bathing assist Assist Level: Independent with assistive device     Upper Body Dressing/Undressing Upper body dressing   What is the patient wearing?: Pull over shirt    Upper body assist Assist Level: Independent    Lower Body Dressing/Undressing Lower body dressing      What is the patient wearing?: Underwear/pull up, Pants     Lower body assist Assist for lower body dressing: Independent     Toileting Toileting    Toileting assist Assist for toileting: Independent Assistive Device Comment: (urinal)   Transfers Chair/bed transfer  Transfers assist     Chair/bed transfer assist level: Independent     Locomotion Ambulation   Ambulation assist      Assist level: Independent Assistive device: No Device Max distance: 150'   Walk 10 feet activity   Assist     Assist level: Independent Assistive device: No Device   Walk 50 feet activity   Assist    Assist level: Independent Assistive device: No Device    Walk 150 feet activity   Assist    Assist level: Independent Assistive device: No Device    Walk 10 feet on uneven surface  activity   Assist     Assist level: Independent     Wheelchair  Assist Will patient use wheelchair at discharge?: No(Patient is a functional ambulator)   Wheelchair activity did not occur: N/A         Wheelchair 50 feet with 2 turns activity    Assist    Wheelchair 50 feet with 2 turns activity did not occur: N/A       Wheelchair 150 feet activity     Assist  Wheelchair 150 feet activity did not occur: N/A       Blood pressure 116/74, pulse 65, temperature 97.7 F (36.5 C), temperature source Oral, resp. rate 18, height 6' (1.829 m), weight 107.2 kg, SpO2 98  %.   Medical Problem List and Plan: 1.  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  3/12- set d/c date as next Tuesday- 05/15/19 as d/c date             Admit to CIR 2.  Antithrombotics: -DVT/anticoagulation: SCDs. Walking a lot.  3/12- doesn't need lovenox per surgeons              -antiplatelet therapy: Aspirin 325 mg daily 3. Pain Management: Tramadol as needed  3/11- scheduled tylenol for pain since hurting a lot.   3/12- pain much better controlled  3/14- pt assured me didn't need tramadol but encouraged if impacts therapy since wincing upon palpation.  4. Mood: Provide emotional support             -antipsychotic agents: N/A 5. Neuropsych: This patient is capable of making decisions on his own behalf. 6. Skin/Wound Care: Routine skin checks 7. Fluids/Electrolytes/Nutrition: Routine in and outs.  CMP ordered. 8.  Post stroke dysphagia.  Dysphagia #1 nectar thick liquids.  Follow-up speech therapy  3/11- pt asking about getting Cortrak out- explained need OK from SLP- he understands.   3/12- cortrak out- diet upgraded to regular diet with thin liquids.  9.  Hypertension/V. tach.  Lopressor 25 mg twice daily.  3/11- well controlled- con't meds  3/13- BP well controlled- 120s-130s SBP- con't meds    Follow-up cardiology services             Monitor with increased mobility 10.  Postoperative pericarditis.  Continue colchicine 11.  Tobacco abuse.  Counsel  3/11- pt reports doesn't plan on smoking again- will con't counseling.   3/14- pt assures me won't smoke again, but was long habit- needs encouragement to stop.  12.  Hyperlipidemia.  Crestor 13. Expressive  aphasia  3/12- will try Amantadine 100 mg daily to see if will improve speech output.   3/13- pt has no side effects- will con't- making speech gains.   3/14- pt now repeating interviewer somewhat- doing better daily.  14. Insomnia  3/14- added trazodone 50 mg QHS 15. Dispo  3/13- d/c Tuesday and doing IPOC paperwork today for pt.   3/16- d/c today.   LOS: 6 days A FACE TO FACE EVALUATION WAS PERFORMED  Erik Parker 05/15/2019, 8:45 AM

## 2019-05-15 NOTE — Plan of Care (Signed)
  Problem: Consults Goal: RH STROKE PATIENT EDUCATION Description: See Patient Education module for education specifics  Outcome: Completed/Met Goal: Nutrition Consult-if indicated Outcome: Completed/Met   Problem: RH BOWEL ELIMINATION Goal: RH STG MANAGE BOWEL WITH ASSISTANCE Description: STG Manage Bowel with  mod Assistance. Outcome: Completed/Met   Problem: RH SKIN INTEGRITY Goal: RH STG SKIN FREE OF INFECTION/BREAKDOWN Description: Skin to remain free from infection and breakdown while on rehab with min assist. Outcome: Completed/Met   Problem: RH BLADDER ELIMINATION Goal: RH STG MANAGE BLADDER WITH ASSISTANCE Description: STG Manage Bladder With mod Assistance Outcome: Completed/Met

## 2019-05-17 ENCOUNTER — Other Ambulatory Visit: Payer: Self-pay

## 2019-05-17 ENCOUNTER — Encounter (HOSPITAL_COMMUNITY): Payer: Self-pay

## 2019-05-17 ENCOUNTER — Ambulatory Visit (HOSPITAL_COMMUNITY): Payer: 59 | Attending: Physician Assistant

## 2019-05-17 ENCOUNTER — Other Ambulatory Visit: Payer: Self-pay | Admitting: Cardiothoracic Surgery

## 2019-05-17 ENCOUNTER — Telehealth: Payer: Self-pay | Admitting: Registered Nurse

## 2019-05-17 DIAGNOSIS — I71019 Dissection of thoracic aorta, unspecified: Secondary | ICD-10-CM

## 2019-05-17 DIAGNOSIS — R41841 Cognitive communication deficit: Secondary | ICD-10-CM | POA: Insufficient documentation

## 2019-05-17 DIAGNOSIS — R29898 Other symptoms and signs involving the musculoskeletal system: Secondary | ICD-10-CM | POA: Diagnosis not present

## 2019-05-17 DIAGNOSIS — I7101 Dissection of thoracic aorta: Secondary | ICD-10-CM

## 2019-05-17 NOTE — Therapy (Signed)
[facility] [address] [place], [place], [address] Phone: [phone]   Fax:  [phone]  Occupational Therapy Evaluation  Patient Details  Name: Erik Parker MRN: [id number] Date of Birth: 1965/12/25 Referring Provider (OT): Charlton Amor, PA-C   Encounter Date: 05/17/2019  OT End of Session - 05/17/19 0946    Visit Number  1    Number of Visits  1    Authorization Type  [employer] NAP    Authorization Time Period  No authorization. No co pay. 60 visit limit    Authorization - Visit Number  1    Authorization - Number of Visits  60    OT Start Time  0815    OT Stop Time  0840    OT Time Calculation (min)  25 min    Activity Tolerance  Patient tolerated treatment well    Behavior During Therapy  WFL for tasks assessed/performed       Past Medical History:  Diagnosis Date  . Hyperlipidemia   . Hypertension     Past Surgical History:  Procedure Laterality Date  . ACHILLES TENDON SURGERY Right 11/10/2016   Procedure: ACHILLES TENDON REPAIR VIA SPEED BRIDGE;  Surgeon: [name], DPM;  Location: [facility];  Service: Podiatry;  Laterality: Right;  . HEEL SPUR RESECTION Right 11/10/2016   Procedure: HEEL SPUR RESECTION (HAGLUNDS DEFORMITY);  Surgeon: [name], DPM;  Location: [facility];  Service: Podiatry;  Laterality: Right;  . REPAIR OF ACUTE ASCENDING THORACIC AORTIC DISSECTION N/A 05/01/2019   Procedure: REPAIR OF ACUTE ASCENDING THORACIC AORTIC DISSECTION USING HEMOSHIELD GRAFT SIZE ;  Surgeon: Linden Dolin, MD;  Location: [facility] OR;  Service: Cardiothoracic;  Laterality: N/A;    There were no vitals filed for this visit.  Subjective Assessment - 05/17/19 0901    Subjective   Pt reports that his speech is the only thing he is having difficulty with.    Patient is accompanied by:  Family member   Spouse: Erik Parker   Pertinent History  Patient is a 54 y/o male S/P Right CVA which occurred on 05/01/19. CT of the  head showed left frontal operculum acute nonhemorrhagic infarction.  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 Vickey Sages.  Intraoperative echocardiogram with ejection fraction of 50%.  Follow-up MRI of the brain showed scattered areas of acute infarction in both hemispheres more extensive on the left than right, consistent with embolic infarctions.  The largest area of involvement 3.5 to 4 cm stroke in the left frontal operculum with mild swelling and minimal petechial blood products.  Scattered other strokes in the left hemisphere next largest measuring 2 cm at the left parieto-occipital junction.  Single acute infarction in the right hemisphere measuring less than a centimeter in size in the right frontal lobe. Patient was placed in ICU and then transferred to CIR for 1 week while receiving OT, PT, and speech services. Dr.Angiulli has referred patient to occupational therapy for evaluation and treatment.    Patient Stated Goals  To work on speech.    Currently in Pain?  No/denies        [facility] OT Assessment - 05/17/19 0823      Assessment   Medical Diagnosis  S/P CVA    Referring Provider (OT)  Charlton Amor, PA-C    Onset Date/Surgical Date  05/01/19  Hand Dominance  Right    Next MD Visit  06/21/19    Prior Therapy  1 week CIR       Precautions   Precautions  Sternal      Restrictions   Weight Bearing Restrictions  Yes   sternal precautions     Balance Screen   Has the patient fallen in the past 6 months  No      Home  Environment   Family/patient expects to be discharged to:  Private residence    Living Arrangements  Spouse/significant other      Prior Function   Level of Independence  Independent    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       Mobility   Mobility Status  Independent      Written Expression   Dominant Hand  Right      Vision - History   Baseline Vision  Wears glasses only for reading      Cognition   Overall Cognitive Status  Within Functional Limits for tasks assessed    Cognition Comments  Not formally tested. Pt was able to understand and answer questions with assistance from spouse as needed due to articulation difficulties.      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    Light Touch Impaired Details  Impaired RUE;Impaired LUE    Stereognosis  Impaired Detail    Stereognosis Impaired Details  Impaired RUE   Due to CTS   Hot/Cold  Appears Intact    Proprioception  Appears Intact      Coordination   Gross Motor Movements are Fluid and Coordinated  Yes    Fine Motor Movements are Fluid and Coordinated  Yes    9 Hole Peg Test  Right;Left    Right 9 Hole Peg Test  26.5"    Left 9 Hole Peg Test  19.2"      ROM / Strength   AROM / PROM / Strength  Strength;AROM      AROM   Overall AROM   Within functional limits for tasks performed    Overall AROM Comments  WFL within sternal precautions parameters.      Strength   Overall Strength Comments  Unable to formally test BUE strength due to sternal precautions. Based on interview with patient and spouse, BUE strength is functional within sternal precautions.    Strength Assessment Site  Hand    Right/Left hand  Right;Left    Right Hand Grip (lbs)  85    Right Hand Lateral Pinch  18 lbs    Right Hand 3 Point Pinch  14 lbs    Left Hand Grip (lbs)  115    Left Hand Lateral Pinch  24 lbs    Left Hand 3 Point Pinch  20 lbs                                  Plan - 05/17/19 0948    Clinical Impression Statement  A: Patien is a 54 y/o male S/P  right CVA presenting to OT with reports of only speech  articulation issues. Wife is present to assist with communication as needed. Also confirms that he is physically doing well and able to complete all daily tasks. Patient does present with right side weakness for grip and pinch strength although upon further investigation it is determined to be related to bilateral CTS which is more severe on the right with decreased sensation. Pt and wife report that prior to the CVA, patient was seeing a MD regarding his carpal tunnel although the conservative management techniques were not helping. At this time, patient does not require any additional OT services related to the CVA. Patient and wife are in agreement with recommendation.    OT Occupational Profile and History  Problem Focused Assessment - Including review of records relating to presenting problem    Occupational performance deficits (Please refer to evaluation for details):  Other   N/A speech only   Body Structure / Function / Physical Skills  --   N/A   Rehab Potential  Excellent    Clinical Decision Making  Limited treatment options, no task modification necessary    Comorbidities Affecting Occupational Performance:  May have comorbidities impacting occupational performance    Modification or Assistance to Complete Evaluation   No modification of tasks or assist necessary to complete eval    OT Frequency  One time visit    OT Treatment/Interventions  --   N/A Eval only   Plan  P: one time visit. No additional OT service at this time. patient has an evaluation scheduled with SLP next week.    Consulted and Agree with Plan of Care  Patient;Family member/caregiver    Family Member Consulted  Wife       Patient will benefit from skilled therapeutic intervention in order to improve the following deficits and impairments:   Body Structure / Function / Physical Skills: (N/A)       Visit Diagnosis: Other symptoms and signs involving the  musculoskeletal system    Problem List Patient Active Problem List   Diagnosis Date Noted  . Aphasia as late effect of cerebrovascular accident (CVA) 05/10/2019  . Dysphagia 05/10/2019  . History of open heart surgery   . Pericarditis   . Acute CVA (cerebrovascular accident) ([place])   . Essential hypertension   . Dyslipidemia   . Tobacco abuse   . Tachypnea   . Leukocytosis   . Cerebral embolism with cerebral infarction 05/03/2019  . Aortic dissection ([place]) 05/01/2019  . 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 Bilateral hand numbness 02/26/2019  . Decreased grip strength 02/26/2019   Ailene Ravel, OTR/L,CBIS  [phone]  05/17/2019, 9:53 AM  [facility] [address] [place], [place], [address] Phone: [phone]   Fax:  [phone]  Name: GEO SLONE MRN: [id number] Date of Birth: December 01, 1965

## 2019-05-17 NOTE — Progress Notes (Signed)
Social Work Discharge Note   The overall goal for the admission was met for:   Discharge location: Yes. Pt discharged to home with his wife.   Length of Stay: Yes. 6 days  Discharge activity level: Yes. Supervision  Home/community participation: Yes. Limited  Services provided included: MD, RD, PT, OT, SLP, RN, CM, TR, Pharmacy, Neuropsych and SW  Financial Services: Private Insurance: [employer]  Follow-up services arranged: Outpatient: [facility] for PT/OT/ST #[phone]  Comments (or additional information): contact pt s/o Myrle Sheng [phone]  Patient/Family verbalized understanding of follow-up arrangements: Yes  Individual responsible for coordination of the follow-up plan: Pt will have assistance from his wife to coordinate his care needs.   Confirmed correct DME delivered: Rana Snare 05/17/2019    Loralee Pacas, MSW, [place] Office: [phone] Cell: [phone] Fax: [phone]

## 2019-05-17 NOTE — Telephone Encounter (Signed)
Transitional Care call Transitional Questions Answered by Helene Kelp   Patient name: Erik Parker  DOB: 1965-12-16 1. Are you/is patient experiencing any problems since coming home? No a. Are there any questions regarding any aspect of care? No 2. Are there any questions regarding medications administration/dosing? No a. Are meds being taken as prescribed? Yes b. "Patient should review meds with caller to confirm" Medication List Reviewed.  3. Have there been any falls? No 4. Has Home Health been to the house and/or have they contacted you? Yes: Attending Outpatient Therapy at [facility]. a. If not, have you tried to contact them? NA b. Can we help you contact them? NA 5. Are bowels and bladder emptying properly? Yes a. Are there any unexpected incontinence issues? No b. If applicable, is patient following bowel/bladder programs? NA 6. Any fevers, problems with breathing, unexpected pain? No 7. Are there any skin problems or new areas of breakdown? No 8. Has the patient/family member arranged specialty MD follow up (ie cardiology/neurology/renal/surgical/etc.)?  HFU appointments are scheduled.  a. Can we help arrange? NA 9. Does the patient need any other services or support that we can help arrange? No 10. Are caregivers following through as expected in assisting the patient? Yes 11. Has the patient quit smoking, drinking alcohol, or using drugs as recommended? (                        )  Appointment date/time 05/25/2019  arrival time 12:45 for 1:00 appointment with Dr. Berline Chough At [phone]

## 2019-05-21 ENCOUNTER — Ambulatory Visit
Admission: RE | Admit: 2019-05-21 | Discharge: 2019-05-21 | Disposition: A | Discharge status: Home / Self Care | Payer: 59 | Source: Ambulatory Visit | Attending: Cardiothoracic Surgery | Admitting: Cardiothoracic Surgery

## 2019-05-21 ENCOUNTER — Encounter: Payer: Self-pay | Admitting: Cardiothoracic Surgery

## 2019-05-21 ENCOUNTER — Ambulatory Visit (INDEPENDENT_AMBULATORY_CARE_PROVIDER_SITE_OTHER): Payer: Self-pay | Admitting: Cardiothoracic Surgery

## 2019-05-21 ENCOUNTER — Other Ambulatory Visit: Payer: Self-pay | Admitting: *Deleted

## 2019-05-21 ENCOUNTER — Other Ambulatory Visit: Payer: Self-pay

## 2019-05-21 VITALS — BP 121/79 | HR 64 | Temp 98.2°F | Resp 16 | Ht 72.0 in | Wt 243.2 lb

## 2019-05-21 DIAGNOSIS — I639 Cerebral infarction, unspecified: Secondary | ICD-10-CM

## 2019-05-21 DIAGNOSIS — I7101 Dissection of thoracic aorta: Secondary | ICD-10-CM

## 2019-05-21 DIAGNOSIS — I71019 Dissection of thoracic aorta, unspecified: Secondary | ICD-10-CM

## 2019-05-21 DIAGNOSIS — Z9889 Other specified postprocedural states: Secondary | ICD-10-CM

## 2019-05-22 DIAGNOSIS — I472 Ventricular tachycardia: Secondary | ICD-10-CM | POA: Insufficient documentation

## 2019-05-22 DIAGNOSIS — I4729 Other ventricular tachycardia: Secondary | ICD-10-CM | POA: Insufficient documentation

## 2019-05-22 NOTE — Progress Notes (Signed)
[address]       [place] [address]             [phone]     CARDIOTHORACIC SURGERY OFFICE NOTE  Referring Provider is Bensimhon, Shaune Pascal, MD Primary Cardiologist is No primary care provider on file. PCP is [facility]   HPI:  54 yo man s/p repair of type A aortic dissection, presented with acute stroke. He did well after surgery and has been improving neurologically. He has no complaints today.    Current Outpatient Medications  Medication Sig Dispense Refill  . acetaminophen (TYLENOL) 325 MG tablet Take 1-2 tablets (325-650 mg total) by mouth every 4 (four) hours as needed for mild pain.    Marland Kitchen amantadine (SYMMETREL) 100 MG capsule Take 2 capsules (200 mg total) by mouth daily. 60 capsule 1  . aspirin EC 325 MG EC tablet Take 1 tablet (325 mg total) by mouth daily. 30 tablet 0  . bisacodyl (DULCOLAX) 5 MG EC tablet Take 2 tablets (10 mg total) by mouth daily. 30 tablet 0  . colchicine 0.6 MG tablet Take 1 tablet (0.6 mg total) by mouth 2 (two) times daily. 60 tablet 0  . folic acid (FOLVITE) 1 MG tablet Take 1 tablet (1 mg total) by mouth daily. 30 tablet 0  . metoprolol tartrate (LOPRESSOR) 25 MG tablet Take 1 tablet (25 mg total) by mouth 2 (two) times daily. 60 tablet 0  . pantoprazole (PROTONIX) 40 MG tablet Take 1 tablet (40 mg total) by mouth daily. 30 tablet 0  . rosuvastatin (CRESTOR) 20 MG tablet Take 1 tablet (20 mg total) by mouth daily at 6 PM. 30 tablet 0  . traMADol (ULTRAM) 50 MG tablet Take 1-2 tablets (50-100 mg total) by mouth every 4 (four) hours as needed for moderate pain. 30 tablet 0  . traZODone (DESYREL) 50 MG tablet Take 1 tablet (50 mg total) by mouth at bedtime. 20 tablet 0   No current facility-administered medications for this visit.      Physical Exam:   BP 121/79 (BP Location: Left Arm, Patient Position: Sitting, Cuff Size: Large)   Pulse 64   Temp 98.2 F (36.8 C)   Resp 16   Ht 6' (1.829 m)    Wt 110.3 kg   SpO2 98% Comment: RA  BMI 32.98 kg/m   General:  Well-appearing, NAD  Chest:   cta  CV:   rrr  Incisions:  Healing well  Abdomen:  sntnd  Extremities:  Motor intact; no edema  Diagnostic Tests:  cxr with clear lung fields  Impression:  Doing well after repair of Type a dissection.   Plan:  Continue neuro rehab F/u in a few weeks with repeat CT aortogram to eval for residual dissection flap in the descending aorta and to assess ascending repair   I spent in excess of 15 minutes during the conduct of this office consultation and >50% of this time involved direct face-to-face encounter with the patient for counseling and/or coordination of their care.  Level 2                 10 minutes Level 3                 15 minutes Level 4                 25 minutes Level 5  40 minutes  B. Lorayne Marek, MD 05/22/2019 11:52 AM

## 2019-05-22 NOTE — Progress Notes (Signed)
Cardiology Office Note    Date:  05/28/2019   ID:  BAZIL DHANANI, DOB [birth year], MRN [id number]  PCP:  [facility]  Cardiologist: No primary care provider on file. EPS: None  Chief Complaint  Patient presents with  . Hospitalization Follow-up    History of Present Illness:  Erik Parker is a 54 y.o. male with history of HTN,HLD, tobacco abuse presented with left frontal operculum acute nonhemorrhagic infarct. 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 Intraop echo EF 50%. F/u MRI scattered areas of acute infarct more extensive on left >right consistent with embolic infarctions.He developed acute pericarditis post op on colchicine and 23 beats VT treated with metoprolol.Discharged from rehab 05/15/19.  Followed in hospital by Dr. Gala Romney and stat echo 05/03/19 EF 45-50% no regional WMA, hstrop mod elevated but flat in setting of heart surgery not c/w ACS. Plan to keep K.4.0, Mg.2.0 on metoprolol.  Main question is whether or not VT is ischemic mediated.  Preop studies and in OR no evidence of RCA involvement.  Continue metoprolol.  No need for ischemic work-up at that time.  Patient comes in accompanied by his wife. Doing speech therapy but otherwise has recovered well. Walking in the house but nothing else.  Soreness chest from the surgery but denies significant pain when laying down or taking a deep breath.    Past Medical History:  Diagnosis Date  . Hyperlipidemia   . Hypertension     Past Surgical History:  Procedure Laterality Date  . ACHILLES TENDON SURGERY Right 11/10/2016   Procedure: ACHILLES TENDON REPAIR VIA SPEED BRIDGE;  Surgeon: [name], DPM;  Location: [facility];  Service: Podiatry;  Laterality: Right;  . HEEL SPUR RESECTION Right 11/10/2016   Procedure: HEEL SPUR RESECTION (HAGLUNDS DEFORMITY);  Surgeon: [name], DPM;   Location: [facility];  Service: Podiatry;  Laterality: Right;  . REPAIR OF ACUTE ASCENDING THORACIC AORTIC DISSECTION N/A 05/01/2019   Procedure: REPAIR OF ACUTE ASCENDING THORACIC AORTIC DISSECTION USING HEMOSHIELD GRAFT SIZE ;  Surgeon: Linden Dolin, MD;  Location: [facility] OR;  Service: Cardiothoracic;  Laterality: N/A;    Current Medications: Current Meds  Medication Sig  . acetaminophen (TYLENOL) 325 MG tablet Take 1-2 tablets (325-650 mg total) by mouth every 4 (four) hours as needed for mild pain.  Marland Kitchen amantadine (SYMMETREL) 100 MG capsule Take 2 capsules (200 mg total) by mouth daily.  Marland Kitchen aspirin EC 325 MG EC tablet Take 1 tablet (325 mg total) by mouth daily.  . bisacodyl (DULCOLAX) 5 MG EC tablet Take 2 tablets (10 mg total) by mouth daily.  . colchicine 0.6 MG tablet Take 1 tablet (0.6 mg total) by mouth 2 (two) times daily.  . folic acid (FOLVITE) 1 MG tablet Take 1 tablet (1 mg total) by mouth daily.  . metoprolol tartrate (LOPRESSOR) 25 MG tablet Take 1 tablet (25 mg total) by mouth 2 (two) times daily.  . pantoprazole (PROTONIX) 40 MG tablet Take 1 tablet (40 mg total) by mouth daily.  . rosuvastatin (CRESTOR) 20 MG tablet Take 1 tablet (20 mg total) by mouth daily at 6 PM.  . traMADol (ULTRAM) 50 MG tablet Take 1-2 tablets (50-100 mg total) by mouth every 4 (four) hours as needed for moderate pain.  . traZODone (DESYREL) 50 MG tablet Take 1 tablet (50 mg total) by mouth at bedtime.     Allergies:  Patient has no known allergies.   Social History   Socioeconomic History  . Marital status: Single    Spouse name: Not on file  . Number of children: Not on file  . Years of education: Not on file  . Highest education level: Not on file  Occupational History  . Not on file  Tobacco Use  . Smoking status: Former Smoker    Packs/day: 0.50    Years: 9.00    Pack years: 4.50    Types: Cigarettes    Quit date: 05/01/2019    Years since quitting: 0.0  . Smokeless tobacco: Never  Used  Substance and Sexual Activity  . Alcohol use: Not Currently    Comment: occ  . Drug use: No  . Sexual activity: Yes    Partners: Female    Birth control/protection: Pill  Other Topics Concern  . Not on file  Social History Narrative  . Not on file   Social Determinants of Health   Financial Resource Strain:   . Difficulty of Paying Living Expenses:   Food Insecurity:   . Worried About Programme researcher, broadcasting/film/video in the Last Year:   . Barista in the Last Year:   Transportation Needs:   . Freight forwarder (Medical):   Marland Kitchen Lack of Transportation (Non-Medical):   Physical Activity:   . Days of Exercise per Week:   . Minutes of Exercise per Session:   Stress:   . Feeling of Stress :   Social Connections:   . Frequency of Communication with Friends and Family:   . Frequency of Social Gatherings with Friends and Family:   . Attends Religious Services:   . Active Member of Clubs or Organizations:   . Attends Banker Meetings:   Marland Kitchen Marital Status:      Family History:  The patient's   family history includes Hypertension in his father and mother.   ROS:   Please see the history of present illness.    ROS All other systems reviewed and are negative.   PHYSICAL EXAM:   VS:  BP 116/64 (BP Location: Right Arm)   Pulse 70   Temp 98 F (36.7 C)   Ht 6' (1.829 m)   Wt 248 lb (112.5 kg)   SpO2 97%   BMI 33.63 kg/m   Physical Exam  GEN: Well nourished, well developed, in no acute distress  Neck: no JVD, carotid bruits, or masses Cardiac: Incision healing well RRR; no murmurs, rubs, or gallops  Respiratory:  clear to auscultation bilaterally, normal work of breathing GI: soft, nontender, nondistended, + BS Ext: without cyanosis, clubbing, or edema, Good distal pulses bilaterally Neuro:  Alert and Oriented x 3 Psych: euthymic mood, full affect  Wt Readings from Last 3 Encounters:  05/28/19 248 lb (112.5 kg)  05/25/19 244 lb (110.7 kg)  05/21/19 243  lb 3.2 oz (110.3 kg)      Studies/Labs Reviewed:   EKG:  EKG is not ordered today.   Recent Labs: 04/30/2019: B Natriuretic Peptide 31.0 05/08/2019: Magnesium 2.0 05/10/2019: ALT 51; BUN 15; Creatinine, Ser 0.65; Hemoglobin 11.4; Platelets 481; Potassium 4.4; Sodium 139   Lipid Panel    Component Value Date/Time   CHOL 148 05/04/2019 0621   TRIG 130 05/04/2019 1711   HDL 38 (L) 05/04/2019 0621   CHOLHDL 3.9 05/04/2019 0621   VLDL 22 05/04/2019 0621   LDLCALC 88 05/04/2019 0621    Additional studies/ records that were  reviewed today include:  Limited echo 05/03/19 IMPRESSIONS     1. Prominent paradoxical septal motion and respiratory displacement of  the intraventricular septum is seen. Left ventricular ejection fraction,  by estimation, is 45 to 50%. The left ventricle has mildly decreased  function. The left ventricle  demonstrates global hypokinesis. There is mild concentric left ventricular  hypertrophy. Left ventricular diastolic parameters were normal.   2. Right ventricular systolic function not well seen, probably mildly  reduced. The right ventricular size is normal. Tricuspid regurgitation  signal is inadequate for assessing PA pressure.   3. The mitral valve is normal in structure and function. No evidence of  mitral valve regurgitation.   4. Native aortic valve preserved/repaired. The aortic valve is tricuspid.  Aortic valve regurgitation is not visualized. Aortic valve mean gradient  measures 1.7 mmHg.   5. 28 mm tube graft seen in the ascending aorta, with intact anastomoses.  Residual dissection is not identified (on limited aortic views). Aortic  root/ascending aorta has been repaired/replaced.   FINDINGS   Left Ventricle: Prominent paradoxical septal motion and respiratory  displacement of the intraventricular septum is seen. Left ventricular  ejection fraction, by estimation, is 45 to 50%. The left ventricle has  mildly decreased function. The left   ventricle demonstrates global hypokinesis. The left ventricular internal  cavity size was normal in size. There is mild concentric left ventricular  hypertrophy. Abnormal (paradoxical) septal motion consistent with  post-operative status. Indeterminate  filling pressures.   Right Ventricle: The right ventricular size is normal. Right ventricular  systolic function not well seen, probably mildly reduced. Tricuspid  regurgitation signal is inadequate for assessing PA pressure.   Left Atrium: Left atrial size was normal in size.   Right Atrium: Right atrial size was not well visualized.   Pericardium: There is no evidence of pericardial effusion.   Mitral Valve: The mitral valve is normal in structure and function.   Tricuspid Valve: The tricuspid valve is not well visualized. Tricuspid  valve regurgitation is not demonstrated.   Aortic Valve: Native aortic valve preserved/repaired. The aortic valve is  tricuspid. Aortic valve regurgitation is not visualized. Aortic valve mean  gradient measures 1.7 mmHg. Aortic valve peak gradient measures 3.3 mmHg.  Aortic valve area, by VTI  measures 3.31 cm.   Pulmonic Valve: The pulmonic valve was grossly normal. Pulmonic valve  regurgitation is not visualized.   Aorta: 28 mm tube graft seen in the ascending aorta, with intact  anastomoses. Residual dissection is not identified (on limited aortic  views). 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    Intrap op TEE 05/01/19   Indications:     Aortic dissection  Performing Phys: [id number] Z ATKINS  Diagnosing Phys: Arrie AranStephen Turk MD   Complications: No known complications during this procedure.  POST-OP IMPRESSIONS  - Left Ventricle: has mildly reduced systolic function, with an ejection  fraction of 50%.  - Aorta: A graft was placed in the ascending aorta for repair.Distal  hemiarch  reconstruction with 28 mm Dacron graft.  - Left Atrial Appendage: The left atrial  appendage appears unchanged from  pre-bypass.  - Aortic Valve: No stenosis present. There is no regurgitation. Interval  resolution of AI following resuspension of aortic valve and dissection  repair.  - Mitral Valve: The mitral valve appears unchanged from pre-bypass.  - Tricuspid Valve: The tricuspid valve appears unchanged from pre-bypass.  - Interatrial Septum: The interatrial septum appears unchanged from  pre-bypass.  - Pericardium:  The pericardium appears unchanged from pre-bypass.   PRE-OP FINDINGS   Left Ventricle: The left ventricle has normal systolic function, with an  ejection fraction of 60-65%. The cavity size was mildly dilated. There is  no increase in left ventricular wall thickness. No evidence of left  ventricular regional wall motion  abnormalities.   Right Ventricle: The right ventricle has normal systolic function. The  cavity was normal. There is no increase in right ventricular wall  thickness.   Left Atrium: Left atrial size was normal in size. The left atrial  appendage is well visualized and there is no evidence of thrombus present.   Right Atrium: Right atrial size was normal in size. Right atrial pressure  is estimated at 10 mmHg.   Interatrial Septum: No atrial level shunt detected by color flow Doppler.   Pericardium: There is no evidence of pericardial effusion.   Mitral Valve: The mitral valve is normal in structure. Mitral valve  regurgitation is not visualized by color flow Doppler.   Tricuspid Valve: The tricuspid valve was normal in structure. Tricuspid  valve regurgitation was not visualized by color flow Doppler.   Aortic Valve: The aortic valve is tricuspid Aortic valve regurgitation is  moderate to severe by color flow Doppler. There is no evidence of aortic  valve stenosis.   Pulmonic Valve: The pulmonic valve was normal in structure.  Pulmonic valve regurgitation is trivial by color flow Doppler.    Aorta: There is evidence of  a dissection in the ascending aorta and  Dissection flap originating at STJ and extending to proximal descending  aorta.   Pulmonary Artery: The pulmonary artery is of normal size.     Arrie Aran MD  Electronically signed by Arrie Aran MD  Signature Date/Time: 05/03/2019/7:06:21 AM        Final      ASSESSMENT:    1. Dissection of thoracic aorta (HCC)   2. Acute pericarditis associated with other disease   3. Monomorphic ventricular tachycardia (HCC)   4. Cerebral infarction due to embolism of left middle cerebral artery (HCC)   5. Essential hypertension   6. 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  Will refer to cardiac rehab.  Post op pericarditis on colchicine-we will continue for at least 6 weeks.  Follow-up with Dr. Wyline Mood to reassess.  We will also schedule follow-up echo.  Monomorphic VT-23 beats, intraop echo EF 50%-no WMA, keep K.4.0, Mg 2.0 on metoprolol.  Repeat labs today.  Continue metoprolol.  Embolic CVA-just finished rehab and doing outpatient speech therapy..  Essential HTN blood pressure well controlled.  Dyslipidemia on Crestor.  Will need fasting lipid panel in 2 months   Medication Adjustments/Labs and Tests Ordered: Current medicines are reviewed at length with the patient today.  Concerns regarding medicines are outlined above.  Medication changes, Labs and Tests ordered today are listed in the Patient Instructions below. Patient Instructions  Medication Instructions:  Your physician recommends that you continue on your current medications as directed. Please refer to the Current Medication list given to you today.   Labwork: TODAY  CBC CNET NAGNESIUM   Testing/Procedures: Your physician has requested that you have an echocardiogram. Echocardiography is a  painless test that uses sound waves to create images of your heart. It provides your doctor with information about the size and  shape of your heart and how well your heart's chambers and valves are working. This procedure takes approximately one hour. There are no restrictions for this procedure.    Follow-Up: Your physician recommends that you schedule a follow-up appointment in: [place] DR. BRANCH    Any Other Special Instructions Will Be Listed Below (If Applicable).  You have been referred to [place]     If you need a refill on your cardiac medications before your next appointment, please call your pharmacy.      Sumner Boast, PA-C  05/28/2019 1:04 PM    [facility] [address], [place], [place]  [address] Phone: [phone]; Fax: [phone]

## 2019-05-23 NOTE — Progress Notes (Signed)
Amazing save.

## 2019-05-24 ENCOUNTER — Other Ambulatory Visit: Payer: Self-pay

## 2019-05-24 ENCOUNTER — Ambulatory Visit (HOSPITAL_COMMUNITY): Payer: 59 | Admitting: Speech Pathology

## 2019-05-24 ENCOUNTER — Encounter (HOSPITAL_COMMUNITY): Payer: Self-pay | Admitting: Speech Pathology

## 2019-05-24 DIAGNOSIS — R41841 Cognitive communication deficit: Secondary | ICD-10-CM

## 2019-05-24 DIAGNOSIS — R29898 Other symptoms and signs involving the musculoskeletal system: Secondary | ICD-10-CM | POA: Diagnosis not present

## 2019-05-24 NOTE — Therapy (Signed)
[place], [place], [address] Phone: [phone]   Fax:  [phone]  Speech Language Pathology Evaluation  Patient Details  Name: Erik Parker MRN: [id number] Date of Birth: [birth year] Referring Provider (SLP): Lauraine Rinne, PA-C   Encounter Date: 05/24/2019  End of Session - 05/24/19 1204    Visit Number  1    Number of Visits  9    Date for SLP Re-Evaluation  06/28/19    Authorization Type  [employer] NAP   deductible met, no co-pay, 60 visit limit   SLP Start Time  0900    SLP Stop Time   0950    SLP Time Calculation (min)  50 min    Activity Tolerance  Patient tolerated treatment well       Past Medical History:  Diagnosis Date  . Hyperlipidemia   . Hypertension     Past Surgical History:  Procedure Laterality Date  . ACHILLES TENDON SURGERY Right 11/10/2016   Procedure: ACHILLES TENDON REPAIR VIA SPEED BRIDGE;  Surgeon: [name], DPM;  Location: [facility];  Service: Podiatry;  Laterality: Right;  . HEEL SPUR RESECTION Right 11/10/2016   Procedure: HEEL SPUR RESECTION (HAGLUNDS DEFORMITY);  Surgeon: [name], DPM;  Location: [facility];  Service: Podiatry;  Laterality: Right;  . REPAIR OF ACUTE ASCENDING THORACIC AORTIC DISSECTION N/A 05/01/2019   Procedure: REPAIR OF ACUTE ASCENDING THORACIC AORTIC DISSECTION USING HEMOSHIELD GRAFT SIZE 28MM;  Surgeon: Wonda Olds, MD;  Location: [facility] OR;  Service: Cardiothoracic;  Laterality: N/A;    There were no vitals filed for this visit.  Subjective Assessment - 05/24/19 0932    Subjective  "My speech"    Patient is accompained by:  Family member    Special Tests  SLUMS, BNT short form, portions of WAB    Currently in Pain?  No/denies         SLP Evaluation [facility] - 05/24/19 0932      SLP Visit Information   SLP Received On  05/24/19    Referring Provider (SLP)  Lauraine Rinne, PA-C    Onset Date  05/01/19    Medical Diagnosis  s/p CVA       Subjective   Subjective  "My speech"    Patient/Family Stated Goal  "I want to talk like I used to."      General Information   HPI  Erik Parker. [name] is a 54 year old right-handed male history of hypertension, hyperlipidemia as well as tobacco abuse. Patient lives with spouse.  Independent prior to admission working full-time.  He presented on 05/01/2019 with right facial droop, chest pain, and right arm weakness. CT of the head showed left frontal operculum acute nonhemorrhagic infarction.  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 Orvan Seen.  Intraoperative echocardiogram with ejection fraction of 50%. Follow-up MRI of the brain showed scattered areas of acute infarction in both hemispheres, more extensive on the left than right, consistent with embolic infarctions.  The largest area of involvement 3.5-4 cm stroke in the left frontal operculum with mild swelling and minimal petechial blood products.  Scattered other strokes in the left hemisphere, next largest measuring 2 cm at the left parieto-occipital junction.  Single acute infarction in the right hemisphere measuring less than a centimeter in size in the right frontal lobe.  Acute postoperative pericarditis  maintained on colchicine. Pt attended CIR 05/09/19-05/15/19. He was referred for OP SLP therapy evaluation and treatment by Lauraine Rinne, PA-C.     Behavioral/Cognition  alert and cooperative    Mobility Status  ambulatory      Balance Screen   Has the patient fallen in the past 6 months  No    Has the patient had a decrease in activity level because of a fear of falling?   No    Is the patient reluctant to leave their home because of a fear of falling?   No      Prior Functional Status   Cognitive/Linguistic Baseline  Within functional limits    Type of Home  House     Lives With   Spouse;Son    Available Support  Family    Education  high school graduate    Vocation  Full time employment      Pain Assessment   Pain Assessment  No/denies pain      Cognition   Overall Cognitive Status  Impaired/Different from baseline    Area of Impairment  Attention    Current Attention Level  Sustained    Attention  Sustained    Sustained Attention  Impaired    Sustained Attention Impairment  Verbal complex;Functional complex    Memory  Appears intact    Awareness  Appears intact    Problem Solving  Appears intact    Behaviors  Perseveration   verbal perseveration     Auditory Comprehension   Overall Auditory Comprehension  Impaired    Yes/No Questions  Within Functional Limits    Commands  Impaired    Multistep Basic Commands  75-100% accurate    Complex Commands  75-100% accurate    Conversation  Complex    Interfering Components  Attention;Processing speed    EffectiveTechniques  Repetition;Extra processing time      Visual Recognition/Discrimination   Discrimination  Within Function Limits      Reading Comprehension   Reading Status  Impaired    Word level  76-100% accurate    Sentence Level  76-100% accurate    Paragraph Level  76-100% accurate    Functional Environmental (signs, name badge)  Within functional limits      Expression   Primary Mode of Expression  Verbal      Verbal Expression   Overall Verbal Expression  Impaired    Initiation  No impairment    Automatic Speech  Name;Social Response;Month of year    Level of Generative/Spontaneous Verbalization  Sentence    Repetition  Impaired    Level of Impairment  Sentence level    Naming  Impairment    Responsive  76-100% accurate    Confrontation  75-100% accurate   BNT short form 12/15   Convergent  75-100% accurate    Divergent  75-100% accurate   9 animals in 60 seconds   Other Naming Comments  apraxic and dysarthric errors impact intelligibility    Verbal Errors  Perseveration;Aware of  errors;Inconsistent    Pragmatics  No impairment    Interfering Components  Speech intelligibility    Effective Techniques  Articulatory cues;Phonemic cues    Non-Verbal Means of Communication  Not applicable      Written Expression   Dominant Hand  Right    Written Expression  Exceptions to [facility]    Copy Ability  Sentence    Self Formulation Ability  Phrase    Effective Techniques  Effective monitoring  and self-correction;Verbal/Language cues;Tactile/Visual cues      Oral Motor/Sensory Function   Overall Oral Motor/Sensory Function  Impaired    Labial ROM  Reduced right    Labial Symmetry  Abnormal symmetry right    Labial Strength  Reduced Right    Labial Coordination  Reduced    Lingual ROM  Within Functional Limits    Lingual Coordination  Reduced    Facial ROM  Reduced right    Facial Symmetry  Right droop    Mandible  Within Functional Limits      Motor Speech   Overall Motor Speech  Impaired    Respiration  Within functional limits    Phonation  Normal    Resonance  Within functional limits    Articulation  Impaired    Level of Impairment  Word    Intelligibility  Intelligibility reduced    Word  50-74% accurate    Phrase  50-74% accurate    Sentence  50-74% accurate    Motor Planning  Impaired    Level of Impairment  Word    Motor Speech Errors  Aware;Groping for words;Inconsistent    Effective Techniques  Slow rate;Over-articulate    Phonation  WFL      Standardized Assessments   Standardized Assessments   --   SLUMS 14/30, BNT short 12/15        SLP Education - 05/24/19 1204    Education Details  Plan for therapy    Person(s) Educated  Patient;Spouse    Methods  Explanation    Comprehension  Verbalized understanding       SLP Short Term Goals - 05/24/19 1224      SLP SHORT TERM GOAL #1   Title  Pt will implement speech intelligibility strategies at the sentence level (decrease rate, self correct errors, self monitoring, checking to see if listener  understands) for 90% intelligibility with min assist.    Baseline  75%    Time  4    Period  Weeks    Status  New    Target Date  06/28/19      SLP SHORT TERM GOAL #2   Title  Pt will complete high level verbal expression tasks (object description, state function, provide short summary, etc) using short sentences with 90% acc and min assist.    Baseline  75% acc    Time  4    Period  Weeks    Status  New    Target Date  06/28/19      SLP SHORT TERM GOAL #3   Title  Pt will complete short paragraph reading comprehension tasks with 80% acc and min assist via multiple choice response.    Baseline  75% sentence level with multiple choice responses    Time  4    Period  Weeks    Status  New    Target Date  06/28/19      SLP SHORT TERM GOAL #4   Title  Pt will verbalize 7+ word sentence using subject, verb, object format when describing pictures with 90% acc with min cues.    Baseline  Pt primarily lists objects during picture description tasks    Time  4    Period  Weeks    Status  New    Target Date  06/28/19      SLP SHORT TERM GOAL #5   Title  Pt will complete apraxia drills (nonsense words, multisyllabic words, progressive sentences) with 80% acc with  mi/mod assist and use of strategies.    Baseline  70% nonsense words    Time  4    Period  Weeks    Status  New    Target Date  06/28/19       SLP Long Term Goals - 05/24/19 1245      SLP LONG TERM GOAL #1   Title  Pt will communicate complex thoughts and feelings to [facility] with use of strategies.    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 Pt appears to have made excellent progress since the onset of his stroke, however he continues to present with speech deficits which prevent him from successfully communicating moderately complex thoughts/feelings or returning to work at this time. Pt his highly motivated to improve his speech and has excellent family support. Pt will benefit from skilled SLP in order to address the above impairments, maximize independence, and decrease burden of care.    Speech Therapy Frequency  2x / week    Duration  4 weeks    Treatment/Interventions  Compensatory strategies;Cueing hierarchy;Patient/family education;Functional tasks;SLP instruction and feedback;Compensatory techniques;Internal/external aids    Potential to Achieve Goals  Good    Potential Considerations  Severity of impairments    SLP Home Exercise Plan  Pt will complete HEP as assigned to facilitate carryover of treatment strategies and techniques in home environment with assist as needed.    Consulted and Agree with Plan of Care  Patient;Family member/caregiver    Family Member Consulted  Wife, [name]       Patient will benefit from skilled therapeutic intervention in order to improve the following deficits and impairments:   Cognitive communication deficit    Problem List Patient Active Problem List   Diagnosis Date Noted  . Monomorphic ventricular tachycardia ([place]) 05/22/2019  . Aphasia as late effect of cerebrovascular accident (CVA) 05/10/2019  . Dysphagia 05/10/2019  . History of open heart surgery   . Pericarditis   . Acute CVA (cerebrovascular accident) ([place])   . Essential hypertension   . Dyslipidemia   . Tobacco abuse   . Tachypnea   . Leukocytosis   . Cerebral embolism with cerebral infarction 05/03/2019  .  Aortic dissection ([place]) 05/01/2019  . 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 Bilateral hand numbness 02/26/2019  . Decreased grip strength 02/26/2019   Thank you,  Genene Churn, CCC-SLP [phone]  [facility] 05/24/2019, 12:47 PM  [place] [address] [place], [place], [address] Phone: [phone]   Fax:  [phone]  Name: MCDONALD REILING MRN: [id number] Date of Birth: [birth year]

## 2019-05-25 ENCOUNTER — Encounter: Payer: Self-pay | Admitting: Physical Medicine and Rehabilitation

## 2019-05-25 ENCOUNTER — Encounter: Payer: 59 | Attending: Physical Medicine and Rehabilitation | Admitting: Physical Medicine and Rehabilitation

## 2019-05-25 VITALS — BP 122/74 | HR 62 | Temp 97.9°F | Ht 72.0 in | Wt 244.0 lb

## 2019-05-25 DIAGNOSIS — I639 Cerebral infarction, unspecified: Secondary | ICD-10-CM | POA: Insufficient documentation

## 2019-05-25 DIAGNOSIS — Z9889 Other specified postprocedural states: Secondary | ICD-10-CM | POA: Diagnosis not present

## 2019-05-25 DIAGNOSIS — I7101 Dissection of thoracic aorta: Secondary | ICD-10-CM | POA: Diagnosis not present

## 2019-05-25 DIAGNOSIS — I69320 Aphasia following cerebral infarction: Secondary | ICD-10-CM | POA: Diagnosis not present

## 2019-05-25 DIAGNOSIS — I71019 Dissection of thoracic aorta, unspecified: Secondary | ICD-10-CM

## 2019-05-25 MED ORDER — AMANTADINE HCL 100 MG PO CAPS: 200.0000 mg | ORAL_CAPSULE | Freq: Every day | ORAL | 1 refills | Status: DC

## 2019-05-25 NOTE — Progress Notes (Signed)
Subjective:    Patient ID: Erik Parker, male    DOB: [birth year], 54 y.o.   MRN: [id number]  HPI   Wife agrees with pt that speech is getting better daily.   Getting to phrase and sentence level speech now- still dysarthric.   Thinks he's taking Amantadine- 2 pills in the morning.  It's almost out, per pt.   Has appointment with Neurology 4/22.  Followed up with Cardiothoracic surgery- followed up earlier this week. Doing well.   Hasn't been checking BP at home-  Started  With outpatient SLP- doesn't need PT or OT.   In afternoon- 3-5pm- is shut down- can hardly understand him.  Not taking naps at all- just keeps going.   No muscle spasms at all.   Pain Inventory Average Pain 0 Pain Right Now 0 My pain is no pain  In the last 24 hours, has pain interfered with the following? General activity 0 Relation with others 0 Enjoyment of life 0 What TIME of day is your pain at its worst? night Sleep (in general) Fair  Pain is worse with: unsure Pain improves with: medication Relief from Meds: 9  Mobility walk without assistance ability to climb steps?  yes transfers alone  Function employed # of hrs/week 40  Neuro/Psych No problems in this area  Prior Studies transitional  Physicians involved in your care transitional   Family History  Problem Relation Age of Onset  . Hypertension Mother   . Hypertension Father    Social History   Socioeconomic History  . Marital status: Single    Spouse name: Not on file  . Number of children: Not on file  . Years of education: Not on file  . Highest education level: Not on file  Occupational History  . Not on file  Tobacco Use  . Smoking status: Current Every Day Smoker    Packs/day: 0.50    Years: 9.00    Pack years: 4.50    Types: Cigarettes  . Smokeless tobacco: Never Used  Substance and Sexual Activity  . Alcohol use: Yes    Comment: occ  . Drug use: No  . Sexual activity: Yes    Partners: Female     Birth control/protection: Pill  Other Topics Concern  . Not on file  Social History Narrative  . Not on file   Social Determinants of Health   Financial Resource Strain:   . Difficulty of Paying Living Expenses:   Food Insecurity:   . Worried About Charity fundraiser in the Last Year:   . Arboriculturist in the Last Year:   Transportation Needs:   . Film/video editor (Medical):   Marland Kitchen Lack of Transportation (Non-Medical):   Physical Activity:   . Days of Exercise per Week:   . Minutes of Exercise per Session:   Stress:   . Feeling of Stress :   Social Connections:   . Frequency of Communication with Friends and Family:   . Frequency of Social Gatherings with Friends and Family:   . Attends Religious Services:   . Active Member of Clubs or Organizations:   . Attends Archivist Meetings:   Marland Kitchen Marital Status:    Past Surgical History:  Procedure Laterality Date  . ACHILLES TENDON SURGERY Right 11/10/2016   Procedure: ACHILLES TENDON REPAIR VIA SPEED BRIDGE;  Surgeon: [name], DPM;  Location: [facility];  Service: Podiatry;  Laterality: Right;  . HEEL SPUR RESECTION Right 11/10/2016  Procedure: HEEL SPUR RESECTION (HAGLUNDS DEFORMITY);  Surgeon: [name], DPM;  Location: [facility];  Service: Podiatry;  Laterality: Right;  . REPAIR OF ACUTE ASCENDING THORACIC AORTIC DISSECTION N/A 05/01/2019   Procedure: REPAIR OF ACUTE ASCENDING THORACIC AORTIC DISSECTION USING HEMOSHIELD GRAFT SIZE ;  Surgeon: Linden Dolin, MD;  Location: [facility] OR;  Service: Cardiothoracic;  Laterality: N/A;   Past Medical History:  Diagnosis Date  . Hyperlipidemia   . Hypertension    BP 122/74   Pulse 62   Temp 97.9 F (36.6 C)   Ht 6' (1.829 m)   Wt 244 lb (110.7 kg)   SpO2 97%   BMI 33.09 kg/m   Opioid Risk Score:   Fall Risk Score:  `1  Depression screen PHQ 2/9  No flowsheet data found.  Review of Systems  Constitutional: Negative.   HENT: Negative.    Eyes: Negative.   Respiratory: Negative.   Cardiovascular: Negative.   Gastrointestinal: Negative.   Endocrine: Negative.   Genitourinary: Negative.   Musculoskeletal: Negative.   Skin: Negative.   Allergic/Immunologic: Negative.   Neurological: Negative.   Hematological: Negative.   Psychiatric/Behavioral: Negative.   All other systems reviewed and are negative.      Objective:   Physical Exam  Awake, alert, appropriate, accompanied by wife, NAD MS: 5/5 in deltoids, biceps, triceps, WE, grip and finger abd B/L 5/5 in HF, KE, KF, DF and PF B/L  Neuro:  Has numbness in L thigh- laterally- from upper thigh to knee only on L thigh Otherwise sensation it intact to light touch in UEs and LEs B/L Tongue to R side and R facial droop noted still No muscle spasms, no increased tone; no hoffman's and no Clonus B/L      Assessment & Plan:  Pt is a 54 yr old male s/p aortic dissection and L opercular infarction here for f/u. Sternal precautions until 07/01/19  1. Will refill Amantadine for another 1 month- so a total of 3 months- and then will re-evaluate if can come off of I or stay on for now.   2.  In case of stroke, Neurology makes decision on driving. Usually looks at therapy notes.    3. Check BP every ~ 2 weeks or feels bad- dizzy/lightheaded, or a headache.   4. Suggest a break vs nap for at least 30 minutes daily late morning or early afternoon. VERY IMPORTANT!   5. Educating on spasticity- went over signs and symptoms- curled toes, moving through molasses, or muscle spasms.  If develops, call for earlier appointment Within the next 2 weeks or so, if has Symptoms.   6. Can practice reading aloud , or conversation to work on speech..   7. F/U in 2 months- earlier if needed   I spent a total of 25 minutes on appointment- discussing topics as detailed above.

## 2019-05-25 NOTE — Patient Instructions (Signed)
Pt is a 54 yr old male s/p aortic dissection and L opercular infarction here for f/u. Sternal precautions until 07/01/19  1. Will refill Amantadine for another 1 month- so a total of 3 months- and then will re-evaluate if can come off of I or stay on for now.   2.  In case of stroke, Neurology makes decision on driving. Usually looks at therapy notes.    3. Check BP every ~ 2 weeks or feels bad- dizzy/lightheaded, or a headache.   4. Suggest a break vs nap for at least 30 minutes daily late morning or early afternoon. VERY IMPORTANT!   5. Educating on spasticity- went over signs and symptoms- curled toes, moving through molasses, or muscle spasms.  If develops, call for earlier appointment Within the next 2 weeks or so, if has Symptoms.   6. Can practice reading aloud , or conversation to work on speech..   7. F/U in 2 months- earlier if needed

## 2019-05-28 ENCOUNTER — Other Ambulatory Visit: Payer: Self-pay

## 2019-05-28 ENCOUNTER — Ambulatory Visit (INDEPENDENT_AMBULATORY_CARE_PROVIDER_SITE_OTHER): Payer: 59 | Admitting: Physician Assistant

## 2019-05-28 ENCOUNTER — Encounter: Payer: Self-pay | Admitting: Physician Assistant

## 2019-05-28 ENCOUNTER — Other Ambulatory Visit (HOSPITAL_COMMUNITY)
Admission: RE | Admit: 2019-05-28 | Discharge: 2019-05-28 | Disposition: A | Discharge status: Home / Self Care | Payer: 59 | Source: Ambulatory Visit | Attending: Physician Assistant | Admitting: Physician Assistant

## 2019-05-28 ENCOUNTER — Encounter: Payer: 59 | Admitting: Physical Medicine and Rehabilitation

## 2019-05-28 VITALS — BP 116/64 | HR 70 | Temp 98.0°F | Ht 72.0 in | Wt 248.0 lb

## 2019-05-28 DIAGNOSIS — I63412 Cerebral infarction due to embolism of left middle cerebral artery: Secondary | ICD-10-CM

## 2019-05-28 DIAGNOSIS — E309 Disorder of puberty, unspecified: Secondary | ICD-10-CM | POA: Insufficient documentation

## 2019-05-28 DIAGNOSIS — I7101 Dissection of thoracic aorta: Secondary | ICD-10-CM

## 2019-05-28 DIAGNOSIS — I4729 Other ventricular tachycardia: Secondary | ICD-10-CM

## 2019-05-28 DIAGNOSIS — I472 Ventricular tachycardia: Secondary | ICD-10-CM | POA: Diagnosis not present

## 2019-05-28 DIAGNOSIS — I309 Acute pericarditis, unspecified: Secondary | ICD-10-CM

## 2019-05-28 DIAGNOSIS — E785 Hyperlipidemia, unspecified: Secondary | ICD-10-CM

## 2019-05-28 DIAGNOSIS — I71019 Dissection of thoracic aorta, unspecified: Secondary | ICD-10-CM

## 2019-05-28 DIAGNOSIS — I10 Essential (primary) hypertension: Secondary | ICD-10-CM

## 2019-05-28 LAB — CBC WITH DIFFERENTIAL/PLATELET
Abs Immature Granulocytes: 0.36 10*3/uL — ABNORMAL HIGH (ref 0.00–0.07)
Basophils Absolute: 0.1 10*3/uL (ref 0.0–0.1)
Basophils Relative: 1 %
Eosinophils Absolute: 0.2 10*3/uL (ref 0.0–0.5)
Eosinophils Relative: 2 %
HCT: 41.7 % (ref 39.0–52.0)
Hemoglobin: 13 g/dL (ref 13.0–17.0)
Immature Granulocytes: 4 %
Lymphocytes Relative: 31 %
Lymphs Abs: 3.2 10*3/uL (ref 0.7–4.0)
MCH: 27.5 pg (ref 26.0–34.0)
MCHC: 31.2 g/dL (ref 30.0–36.0)
MCV: 88.2 fL (ref 80.0–100.0)
Monocytes Absolute: 1 10*3/uL (ref 0.1–1.0)
Monocytes Relative: 10 %
Neutro Abs: 5.5 10*3/uL (ref 1.7–7.7)
Neutrophils Relative %: 52 %
Platelets: 450 10*3/uL — ABNORMAL HIGH (ref 150–400)
RBC: 4.73 MIL/uL (ref 4.22–5.81)
RDW: 14.6 % (ref 11.5–15.5)
WBC: 10.4 10*3/uL (ref 4.0–10.5)
nRBC: 0 % (ref 0.0–0.2)

## 2019-05-28 LAB — MAGNESIUM: Magnesium: 2 mg/dL (ref 1.7–2.4)

## 2019-05-28 LAB — BASIC METABOLIC PANEL
Anion gap: 10 (ref 5–15)
BUN: 11 mg/dL (ref 6–20)
CO2: 25 mmol/L (ref 22–32)
Calcium: 9.7 mg/dL (ref 8.9–10.3)
Chloride: 102 mmol/L (ref 98–111)
Creatinine, Ser: 0.72 mg/dL (ref 0.61–1.24)
GFR calc Af Amer: >60 mL/min (ref >60)
GFR calc non Af Amer: >60 mL/min (ref >60)
Glucose, Bld: 101 mg/dL — ABNORMAL HIGH (ref 70–99)
Potassium: 4.2 mmol/L (ref 3.5–5.1)
Sodium: 137 mmol/L (ref 135–145)

## 2019-05-28 NOTE — Patient Instructions (Signed)
Medication Instructions:  Your physician recommends that you continue on your current medications as directed. Please refer to the Current Medication list given to you today.   Labwork: TODAY  CBC CNET NAGNESIUM   Testing/Procedures: Your physician has requested that you have an echocardiogram. Echocardiography is a painless test that uses sound waves to create images of your heart. It provides your doctor with information about the size and shape of your heart and how well your heart's chambers and valves are working. This procedure takes approximately one hour. There are no restrictions for this procedure.    Follow-Up: Your physician recommends that you schedule a follow-up appointment in: 3 WEEKS WITH DR. BRANCH    Any Other Special Instructions Will Be Listed Below (If Applicable).  You have been referred to CARDIAC REHAB     If you need a refill on your cardiac medications before your next appointment, please call your pharmacy.

## 2019-05-30 ENCOUNTER — Other Ambulatory Visit: Payer: Self-pay

## 2019-05-30 ENCOUNTER — Ambulatory Visit (HOSPITAL_COMMUNITY): Payer: 59 | Admitting: Speech Pathology

## 2019-05-30 ENCOUNTER — Encounter (HOSPITAL_COMMUNITY): Payer: Self-pay | Admitting: Speech Pathology

## 2019-05-30 DIAGNOSIS — R29898 Other symptoms and signs involving the musculoskeletal system: Secondary | ICD-10-CM | POA: Diagnosis not present

## 2019-05-30 DIAGNOSIS — Z736 Limitation of activities due to disability: Secondary | ICD-10-CM

## 2019-05-30 DIAGNOSIS — R41841 Cognitive communication deficit: Secondary | ICD-10-CM

## 2019-05-30 NOTE — Therapy (Signed)
[place], [place], [address] Phone: [phone]   Fax:  [phone]  Speech Language Pathology Treatment  Patient Details  Name: Erik Parker MRN: [id number] Date of Birth: 09-Nov-1965 Referring Provider (SLP): Lauraine Rinne, PA-C   Encounter Date: 05/30/2019  End of Session - 05/30/19 1029    Visit Number  2    Number of Visits  9    Date for SLP Re-Evaluation  06/28/19    Authorization Type  [employer] NAP   deductible met, no co-pay, 60 visit limit   SLP Start Time  0908    SLP Stop Time   1000    SLP Time Calculation (min)  52 min    Activity Tolerance  Patient tolerated treatment well       Past Medical History:  Diagnosis Date  . Hyperlipidemia   . Hypertension     Past Surgical History:  Procedure Laterality Date  . ACHILLES TENDON SURGERY Right 11/10/2016   Procedure: ACHILLES TENDON REPAIR VIA SPEED BRIDGE;  Surgeon: [name], DPM;  Location: [facility];  Service: Podiatry;  Laterality: Right;  . HEEL SPUR RESECTION Right 11/10/2016   Procedure: HEEL SPUR RESECTION (HAGLUNDS DEFORMITY);  Surgeon: [name], DPM;  Location: [facility];  Service: Podiatry;  Laterality: Right;  . REPAIR OF ACUTE ASCENDING THORACIC AORTIC DISSECTION N/A 05/01/2019   Procedure: REPAIR OF ACUTE ASCENDING THORACIC AORTIC DISSECTION USING HEMOSHIELD GRAFT SIZE 28MM;  Surgeon: Wonda Olds, MD;  Location: [facility] OR;  Service: Cardiothoracic;  Laterality: N/A;    There were no vitals filed for this visit.  Subjective Assessment - 05/30/19 0927    Subjective  "My chest is sore."    Currently in Pain?  No/denies            ADULT SLP TREATMENT - 05/30/19 0942      General Information   Behavior/Cognition  Alert;Cooperative;Pleasant mood    Patient Positioning  Upright in chair    Oral care provided  N/A    HPI  [name] is a 54 year old right-handed male history of hypertension, hyperlipidemia as  well as tobacco abuse. Patient lives with spouse.  Independent prior to admission working full-time.  He presented on 05/01/2019 with right facial droop, chest pain, and right arm weakness. CT of the head showed left frontal operculum acute nonhemorrhagic infarction.  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 Orvan Seen.  Intraoperative echocardiogram with ejection fraction of 50%. Follow-up MRI of the brain showed scattered areas of acute infarction in both hemispheres, more extensive on the left than right, consistent with embolic infarctions.  The largest area of involvement 3.5-4 cm stroke in the left frontal operculum with mild swelling and minimal petechial blood products.  Scattered other strokes in the left hemisphere, next largest measuring 2 cm at the left parieto-occipital junction.  Single acute infarction in the right hemisphere measuring less than a centimeter in size in the right frontal lobe.  Acute postoperative pericarditis maintained on colchicine. Pt attended CIR 05/09/19-05/15/19. He was referred for OP SLP therapy evaluation and treatment by Lauraine Rinne, PA-C.       Treatment Provided   Treatment provided  Cognitive-Linquistic      Pain Assessment   Pain Assessment  No/denies pain      Cognitive-Linquistic Treatment   Treatment focused on  Aphasia;Apraxia;Dysarthria;Patient/family/caregiver education    Skilled Treatment  SLP provided skilled treatment targeting apraxia and dysarthria goals via sound probes at the word level.     Assessment / Recommendations / Plan   Plan  Continue with current plan of care      Progression Toward Goals   Progression toward goals  Progressing toward goals       SLP Education - 05/30/19 1027    Education Details  Provided notebook for HEP and added word lists targeting specific sounds (f,  l, r, j, blends)    Person(s) Educated  Patient    Methods  Handout    Comprehension  Verbalized understanding       SLP Short Term Goals - 05/30/19 1029      SLP SHORT TERM GOAL #1   Title  Pt will implement speech intelligibility strategies at the sentence level (decrease rate, self correct errors, self monitoring, checking to see if listener understands) for 90% intelligibility with min assist.    Baseline  75%    Time  4    Period  Weeks    Status  On-going    Target Date  06/28/19      SLP SHORT TERM GOAL #2   Title  Pt will complete high level verbal expression tasks (object description, state function, provide short summary, etc) using short sentences with 90% acc and min assist.    Baseline  75% acc    Time  4    Period  Weeks    Status  On-going    Target Date  06/28/19      SLP SHORT TERM GOAL #3   Title  Pt will complete short paragraph reading comprehension tasks with 80% acc and min assist via multiple choice response.    Baseline  75% sentence level with multiple choice responses    Time  4    Period  Weeks    Status  On-going    Target Date  06/28/19      SLP SHORT TERM GOAL #4   Title  Pt will verbalize 7+ word sentence using subject, verb, object format when describing pictures with 90% acc with min cues.    Baseline  Pt primarily lists objects during picture description tasks    Time  4    Period  Weeks    Status  On-going    Target Date  06/28/19      SLP SHORT TERM GOAL #5   Title  Pt will complete apraxia drills (nonsense words, multisyllabic words, progressive sentences) with 80% acc with mi/mod assist and use of strategies.    Baseline  70% nonsense words    Time  4    Period  Weeks    Status  On-going    Target Date  06/28/19       SLP Long Term Goals - 05/30/19 1036      SLP LONG TERM GOAL #1   Title  Pt will communicate complex thoughts and feelings to [facility] with use of strategies.    Baseline  moderate impairment    Time  8    Period   Weeks    Status  On-going       Plan - 05/30/19 1029    Clinical Impression Statement Pt motivated for therapy and reports that he orally read the non-sense words sent home last week. Pt reports continued improvement in his speech skills. Pt has difficulty producing the following sounds: r, l, ch, dg, v,  f, z, and blends. He benefited from articulatory cues. Pt had no difficulty with: g, h, k, m, and n. He was given list of targeted words to practice for homework in addition to speech intelligibility and word finding strategies.   Speech Therapy Frequency  2x / week    Duration  4 weeks    Treatment/Interventions  Compensatory strategies;Cueing hierarchy;Patient/family education;Functional tasks;SLP instruction and feedback;Compensatory techniques;Internal/external aids    Potential to Achieve Goals  Good    Potential Considerations  Severity of impairments    SLP Home Exercise Plan  Pt will complete HEP as assigned to facilitate carryover of treatment strategies and techniques in home environment with assist as needed.    Consulted and Agree with Plan of Care  Patient;Family member/caregiver    Family Member Consulted  Wife, [name]       Patient will benefit from skilled therapeutic intervention in order to improve the following deficits and impairments:   Cognitive communication deficit    Problem List Patient Active Problem List   Diagnosis Date Noted  . Monomorphic ventricular tachycardia ([place]) 05/22/2019  . Aphasia as late effect of cerebrovascular accident (CVA) 05/10/2019  . Dysphagia 05/10/2019  . History of open heart surgery   . Pericarditis   . Acute CVA (cerebrovascular accident) ([place])   . Essential hypertension   . Dyslipidemia   . Tobacco abuse   . Tachypnea   . Leukocytosis   . Cerebral embolism with cerebral infarction 05/03/2019  . Aortic dissection ([place]) 05/01/2019  . 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 Bilateral hand numbness 02/26/2019  .  Decreased grip strength 02/26/2019   Thank you,  Genene Churn, CCC-SLP [phone]  [facility] 05/30/2019, 10:38 AM  [place] [address] [place], [place], [address] Phone: [phone]   Fax:  [phone]   Name: VENTURA HOLLENBECK MRN: [id number] Date of Birth: [birth year]

## 2019-05-31 ENCOUNTER — Encounter (HOSPITAL_COMMUNITY): Payer: Self-pay | Admitting: Speech Pathology

## 2019-05-31 ENCOUNTER — Ambulatory Visit (HOSPITAL_COMMUNITY): Payer: 59 | Attending: Physician Assistant | Admitting: Speech Pathology

## 2019-05-31 DIAGNOSIS — R41841 Cognitive communication deficit: Secondary | ICD-10-CM | POA: Diagnosis not present

## 2019-05-31 DIAGNOSIS — R471 Dysarthria and anarthria: Secondary | ICD-10-CM | POA: Diagnosis present

## 2019-05-31 DIAGNOSIS — I69890 Apraxia following other cerebrovascular disease: Secondary | ICD-10-CM | POA: Insufficient documentation

## 2019-05-31 NOTE — Therapy (Signed)
[place], [place], [address] Phone: [phone]   Fax:  [phone]  Speech Language Pathology Treatment  Patient Details  Name: Erik Parker MRN: [id number] Date of Birth: [birth year] Referring Provider (SLP): Lauraine Rinne, PA-C   Encounter Date: 05/31/2019  End of Session - 05/31/19 1335    Visit Number  3    Number of Visits  9    Date for SLP Re-Evaluation  06/28/19    Authorization Type  [employer] NAP   deductible met, no co-pay, 60 visit limit   SLP Start Time  0907    SLP Stop Time   0955    SLP Time Calculation (min)  48 min    Activity Tolerance  Patient tolerated treatment well       Past Medical History:  Diagnosis Date  . Hyperlipidemia   . Hypertension     Past Surgical History:  Procedure Laterality Date  . ACHILLES TENDON SURGERY Right 11/10/2016   Procedure: ACHILLES TENDON REPAIR VIA SPEED BRIDGE;  Surgeon: [name], DPM;  Location: [facility];  Service: Podiatry;  Laterality: Right;  . HEEL SPUR RESECTION Right 11/10/2016   Procedure: HEEL SPUR RESECTION (HAGLUNDS DEFORMITY);  Surgeon: [name], DPM;  Location: [facility];  Service: Podiatry;  Laterality: Right;  . REPAIR OF ACUTE ASCENDING THORACIC AORTIC DISSECTION N/A 05/01/2019   Procedure: REPAIR OF ACUTE ASCENDING THORACIC AORTIC DISSECTION USING HEMOSHIELD GRAFT SIZE 28MM;  Surgeon: Wonda Olds, MD;  Location: [facility] OR;  Service: Cardiothoracic;  Laterality: N/A;    There were no vitals filed for this visit.  Subjective Assessment - 05/31/19 1333    Subjective  "I have been practicing."    Currently in Pain?  No/denies         ADULT SLP TREATMENT - 05/31/19 1335      General Information   Behavior/Cognition  Alert;Cooperative;Pleasant mood    Patient Positioning  Upright in chair    Oral care provided  N/A    HPI  [name] is a 54 year old right-handed male history of hypertension, hyperlipidemia as  well as tobacco abuse. Patient lives with spouse.  Independent prior to admission working full-time.  He presented on 05/01/2019 with right facial droop, chest pain, and right arm weakness. CT of the head showed left frontal operculum acute nonhemorrhagic infarction.  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 Orvan Seen.  Intraoperative echocardiogram with ejection fraction of 50%. Follow-up MRI of the brain showed scattered areas of acute infarction in both hemispheres, more extensive on the left than right, consistent with embolic infarctions.  The largest area of involvement 3.5-4 cm stroke in the left frontal operculum with mild swelling and minimal petechial blood products.  Scattered other strokes in the left hemisphere, next largest measuring 2 cm at the left parieto-occipital junction.  Single acute infarction in the right hemisphere measuring less than a centimeter in size in the right frontal lobe.  Acute postoperative pericarditis maintained on colchicine. Pt attended CIR 05/09/19-05/15/19. He was referred for OP SLP therapy evaluation and treatment by Lauraine Rinne, PA-C.       Treatment Provided   Treatment provided  Cognitive-Linquistic      Pain Assessment   Pain Assessment  No/denies pain      Cognitive-Linquistic Treatment   Treatment focused on  Aphasia;Apraxia;Dysarthria;Patient/family/caregiver education  Skilled Treatment  SLP provided skilled treatment targeting apraxia and dysarthria goals via sound probes at the word level via minimal pairs and apraxia drills.      Assessment / Recommendations / Plan   Plan  Continue with current plan of care      Progression Toward Goals   Progression toward goals  Progressing toward goals         SLP Short Term Goals - 05/31/19 1336      SLP SHORT TERM GOAL #1   Title  Pt will  implement speech intelligibility strategies at the sentence level (decrease rate, self correct errors, self monitoring, checking to see if listener understands) for 90% intelligibility with min assist.    Baseline  75%    Time  4    Period  Weeks    Status  On-going    Target Date  06/28/19      SLP SHORT TERM GOAL #2   Title  Pt will complete high level verbal expression tasks (object description, state function, provide short summary, etc) using short sentences with 90% acc and min assist.    Baseline  75% acc    Time  4    Period  Weeks    Status  On-going    Target Date  06/28/19      SLP SHORT TERM GOAL #3   Title  Pt will complete short paragraph reading comprehension tasks with 80% acc and min assist via multiple choice response.    Baseline  75% sentence level with multiple choice responses    Time  4    Period  Weeks    Status  On-going    Target Date  06/28/19      SLP SHORT TERM GOAL #4   Title  Pt will verbalize 7+ word sentence using subject, verb, object format when describing pictures with 90% acc with min cues.    Baseline  Pt primarily lists objects during picture description tasks    Time  4    Period  Weeks    Status  On-going    Target Date  06/28/19      SLP SHORT TERM GOAL #5   Title  Pt will complete apraxia drills (nonsense words, multisyllabic words, progressive sentences) with 80% acc with mi/mod assist and use of strategies.    Baseline  70% nonsense words    Time  4    Period  Weeks    Status  On-going    Target Date  06/28/19       SLP Long Term Goals - 05/31/19 1337      SLP LONG TERM GOAL #1   Title  Pt will communicate complex thoughts and feelings to [facility] with use of strategies.    Baseline  moderate impairment    Time  8    Period  Weeks    Status  On-going       Plan - 05/31/19 1336    Clinical Impression Statement  Pt motivated for therapy and completed HEP as assigned.. The following sounds were targeted: r, l, ch, dg, v, f, and  z. He benefited from articulatory cues, modeling, mirror for feedback, and picture cues.     Speech Therapy Frequency  2x / week    Duration  4 weeks    Treatment/Interventions  Compensatory strategies;Cueing hierarchy;Patient/family education;Functional tasks;SLP instruction and feedback;Compensatory techniques;Internal/external aids    Potential to Achieve Goals  Good    Potential Considerations  Severity of impairments  SLP Home Exercise Plan  Pt will complete HEP as assigned to facilitate carryover of treatment strategies and techniques in home environment with assist as needed.    Consulted and Agree with Plan of Care  Patient;Family member/caregiver    Family Member Consulted  Wife, [name]       Patient will benefit from skilled therapeutic intervention in order to improve the following deficits and impairments:   Cognitive communication deficit    Problem List Patient Active Problem List   Diagnosis Date Noted  . Monomorphic ventricular tachycardia ([place]) 05/22/2019  . Aphasia as late effect of cerebrovascular accident (CVA) 05/10/2019  . Dysphagia 05/10/2019  . History of open heart surgery   . Pericarditis   . Acute CVA (cerebrovascular accident) ([place])   . Essential hypertension   . Dyslipidemia   . Tobacco abuse   . Tachypnea   . Leukocytosis   . Cerebral embolism with cerebral infarction 05/03/2019  . Aortic dissection ([place]) 05/01/2019  . 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 Bilateral hand numbness 02/26/2019  . Decreased grip strength 02/26/2019   Thank you,  Genene Churn, CCC-SLP [phone]  [facility] 05/31/2019, 1:37 PM  [place] [address] [place], [place], [address] Phone: [phone]   Fax:  [phone]   Name: SABASTIAN RAIMONDI MRN: [id number] Date of Birth: Feb 16, 1966

## 2019-06-04 ENCOUNTER — Encounter (HOSPITAL_COMMUNITY): Payer: Self-pay | Admitting: Speech Pathology

## 2019-06-04 ENCOUNTER — Ambulatory Visit (HOSPITAL_COMMUNITY): Payer: 59 | Admitting: Speech Pathology

## 2019-06-04 ENCOUNTER — Other Ambulatory Visit: Payer: Self-pay

## 2019-06-04 DIAGNOSIS — R41841 Cognitive communication deficit: Secondary | ICD-10-CM

## 2019-06-04 NOTE — Therapy (Signed)
[place], [place], [address] Phone: [phone]   Fax:  [phone]  Speech Language Pathology Treatment  Patient Details  Name: Erik Parker MRN: [id number] Date of Birth: 01-28-1966 Referring Provider (SLP): Lauraine Rinne, PA-C   Encounter Date: 06/04/2019  End of Session - 06/04/19 0937    Visit Number  4    Number of Visits  9    Date for SLP Re-Evaluation  06/28/19    Authorization Type  [employer] NAP   deductible met, no co-pay, 60 visit limit   SLP Start Time  0903    SLP Stop Time   0945    SLP Time Calculation (min)  42 min    Activity Tolerance  Patient tolerated treatment well       Past Medical History:  Diagnosis Date  . Hyperlipidemia   . Hypertension     Past Surgical History:  Procedure Laterality Date  . ACHILLES TENDON SURGERY Right 11/10/2016   Procedure: ACHILLES TENDON REPAIR VIA SPEED BRIDGE;  Surgeon: [name], DPM;  Location: [facility];  Service: Podiatry;  Laterality: Right;  . HEEL SPUR RESECTION Right 11/10/2016   Procedure: HEEL SPUR RESECTION (HAGLUNDS DEFORMITY);  Surgeon: [name], DPM;  Location: [facility];  Service: Podiatry;  Laterality: Right;  . REPAIR OF ACUTE ASCENDING THORACIC AORTIC DISSECTION N/A 05/01/2019   Procedure: REPAIR OF ACUTE ASCENDING THORACIC AORTIC DISSECTION USING HEMOSHIELD GRAFT SIZE 28MM;  Surgeon: Wonda Olds, MD;  Location: [facility] OR;  Service: Cardiothoracic;  Laterality: N/A;    There were no vitals filed for this visit.  Subjective Assessment - 06/04/19 0919    Subjective  "My speech gets harder later in the day and then I take a nap."    Currently in Pain?  No/denies        ADULT SLP TREATMENT - 06/04/19 0930      General Information   Behavior/Cognition  Alert;Cooperative;Pleasant mood    Patient Positioning  Upright in chair    Oral care provided  N/A    HPI  Erik Parker. Heiland is a 54 year old right-handed male history  of hypertension, hyperlipidemia as well as tobacco abuse. Patient lives with spouse.  Independent prior to admission working full-time.  He presented on 05/01/2019 with right facial droop, chest pain, and right arm weakness. CT of the head showed left frontal operculum acute nonhemorrhagic infarction.  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 Orvan Seen.  Intraoperative echocardiogram with ejection fraction of 50%. Follow-up MRI of the brain showed scattered areas of acute infarction in both hemispheres, more extensive on the left than right, consistent with embolic infarctions.  The largest area of involvement 3.5-4 cm stroke in the left frontal operculum with mild swelling and minimal petechial blood products.  Scattered other strokes in the left hemisphere, next largest measuring 2 cm at the left parieto-occipital junction.  Single acute infarction in the right hemisphere measuring less than a centimeter in size in the right frontal lobe.  Acute postoperative pericarditis maintained on colchicine. Pt attended CIR 05/09/19-05/15/19. He was referred for OP SLP therapy evaluation and treatment by Lauraine Rinne, PA-C.       Treatment Provided   Treatment provided  Cognitive-Linquistic      Pain Assessment   Pain Assessment  No/denies pain      Cognitive-Linquistic  Treatment   Treatment focused on  Aphasia;Apraxia;Dysarthria;Patient/family/caregiver education    Skilled Treatment  SLP provided skilled treatment targeting apraxia and dysarthria goals when orally reading short paragraphs and during picture description tasks. He completed reading comprehension task via multiple choice responses with 100% acc. He provided sentences when describing pictures with 100% acc for content; named to description with 100% acc and provided description of words with 100%  acc with min assist. Speech intelligibility was reduced in oral reading of paragraphs when listener/SLP did not know context (judged to be 75% intelligible) and SLP queried when not understood. Sound errors continue to be for multisyllabic words and specific sounds (r, dg).     Assessment / Recommendations / Plan   Plan  Continue with current plan of care      Progression Toward Goals   Progression toward goals  Progressing toward goals         SLP Short Term Goals - 06/04/19 0938      SLP SHORT TERM GOAL #1   Title  Pt will implement speech intelligibility strategies at the sentence level (decrease rate, self correct errors, self monitoring, checking to see if listener understands) for 90% intelligibility with min assist.    Baseline  75%    Time  4    Period  Weeks    Status  On-going    Target Date  06/28/19      SLP SHORT TERM GOAL #2   Title  Pt will complete high level verbal expression tasks (object description, state function, provide short summary, etc) using short sentences with 90% acc and min assist.    Baseline  75% acc    Time  4    Period  Weeks    Status  On-going    Target Date  06/28/19      SLP SHORT TERM GOAL #3   Title  Pt will complete short paragraph reading comprehension tasks with 80% acc and min assist via multiple choice response.    Baseline  75% sentence level with multiple choice responses    Time  4    Period  Weeks    Status  On-going    Target Date  06/28/19      SLP SHORT TERM GOAL #4   Title  Pt will verbalize 7+ word sentence using subject, verb, object format when describing pictures with 90% acc with min cues.    Baseline  Pt primarily lists objects during picture description tasks    Time  4    Period  Weeks    Status  On-going    Target Date  06/28/19      SLP SHORT TERM GOAL #5   Title  Pt will complete apraxia drills (nonsense words, multisyllabic words, progressive sentences) with 80% acc with mi/mod assist and use of  strategies.    Baseline  70% nonsense words    Time  4    Period  Weeks    Status  On-going    Target Date  06/28/19       SLP Long Term Goals - 06/04/19 0942      SLP LONG TERM GOAL #1   Title  Pt will communicate complex thoughts and feelings to [facility] with use of strategies.    Baseline  moderate impairment    Time  8    Period  Weeks    Status  On-going       Plan - 06/04/19 0937    Clinical Impression  Statement Pt continues to be motivated for therapy and is completing HEP outside of sessions. He acknowledges the need to take rest breaks/naps later in the day when his speech becomes less intelligible. Pt demonstrated improvement in producing "f" in oral reading tasks today. Pt given additional HEP to target word finding. Next session, continue addressing apraxia goals.   Speech Therapy Frequency  2x / week    Duration  4 weeks    Treatment/Interventions  Compensatory strategies;Cueing hierarchy;Patient/family education;Functional tasks;SLP instruction and feedback;Compensatory techniques;Internal/external aids    Potential to Achieve Goals  Good    Potential Considerations  Severity of impairments    SLP Home Exercise Plan  Pt will complete HEP as assigned to facilitate carryover of treatment strategies and techniques in home environment with assist as needed.    Consulted and Agree with Plan of Care  Patient;Family member/caregiver    Family Member Consulted  Wife, [name]       Patient will benefit from skilled therapeutic intervention in order to improve the following deficits and impairments:   Cognitive communication deficit    Problem List Patient Active Problem List   Diagnosis Date Noted  . Monomorphic ventricular tachycardia ([place]) 05/22/2019  . Aphasia as late effect of cerebrovascular accident (CVA) 05/10/2019  . Dysphagia 05/10/2019  . History of open heart surgery   . Pericarditis   . Acute CVA (cerebrovascular accident) ([place])   . Essential hypertension    . Dyslipidemia   . Tobacco abuse   . Tachypnea   . Leukocytosis   . Cerebral embolism with cerebral infarction 05/03/2019  . Aortic dissection ([place]) 05/01/2019  . 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 Bilateral hand numbness 02/26/2019  . Decreased grip strength 02/26/2019   Thank you,  Genene Churn, CCC-SLP [phone]  [facility] 06/04/2019, 9:45 AM  [place] [address] [place], [place], [address] Phone: [phone]   Fax:  [phone]   Name: DEMETRUIS DEPAUL MRN: [id number] Date of Birth: 10-28-1965

## 2019-06-05 ENCOUNTER — Encounter: Payer: Self-pay | Admitting: *Deleted

## 2019-06-06 ENCOUNTER — Other Ambulatory Visit: Payer: Self-pay

## 2019-06-06 ENCOUNTER — Encounter (HOSPITAL_COMMUNITY): Payer: Self-pay | Admitting: Speech Pathology

## 2019-06-06 ENCOUNTER — Ambulatory Visit (HOSPITAL_COMMUNITY): Payer: 59 | Admitting: Speech Pathology

## 2019-06-06 ENCOUNTER — Other Ambulatory Visit: Payer: Self-pay | Admitting: *Deleted

## 2019-06-06 DIAGNOSIS — R41841 Cognitive communication deficit: Secondary | ICD-10-CM | POA: Diagnosis not present

## 2019-06-06 NOTE — Therapy (Signed)
[place], [place], [address] Phone: [phone]   Fax:  [phone]  Speech Language Pathology Treatment  Patient Details  Name: Erik Parker MRN: [id number] Date of Birth: 09-13-1965 Referring Provider (SLP): Lauraine Rinne, PA-C   Encounter Date: 06/06/2019  End of Session - 06/06/19 1047    Visit Number  5    Number of Visits  9    Date for SLP Re-Evaluation  06/28/19    Authorization Type  [employer] NAP   deductible met, no co-pay, 60 visit limit   SLP Start Time  0902    SLP Stop Time   0946    SLP Time Calculation (min)  44 min    Activity Tolerance  Patient tolerated treatment well       Past Medical History:  Diagnosis Date  . Hyperlipidemia   . Hypertension     Past Surgical History:  Procedure Laterality Date  . ACHILLES TENDON SURGERY Right 11/10/2016   Procedure: ACHILLES TENDON REPAIR VIA SPEED BRIDGE;  Surgeon: [name], DPM;  Location: [facility];  Service: Podiatry;  Laterality: Right;  . HEEL SPUR RESECTION Right 11/10/2016   Procedure: HEEL SPUR RESECTION (HAGLUNDS DEFORMITY);  Surgeon: [name], DPM;  Location: [facility];  Service: Podiatry;  Laterality: Right;  . REPAIR OF ACUTE ASCENDING THORACIC AORTIC DISSECTION N/A 05/01/2019   Procedure: REPAIR OF ACUTE ASCENDING THORACIC AORTIC DISSECTION USING HEMOSHIELD GRAFT SIZE 28MM;  Surgeon: Wonda Olds, MD;  Location: [facility];  Service: Cardiothoracic;  Laterality: N/A;    There were no vitals filed for this visit.  Subjective Assessment - 06/06/19 0926    Subjective  "Doing well."    Currently in Pain?  No/denies        ADULT SLP TREATMENT - 06/06/19 0001      General Information   Behavior/Cognition  Alert;Cooperative;Pleasant mood    Patient Positioning  Upright in chair    Oral care provided  N/A    HPI  Erik Parker is a 54 year old right-handed male history of hypertension, hyperlipidemia as well as tobacco  abuse. Patient lives with spouse.  Independent prior to admission working full-time.  He presented on 05/01/2019 with right facial droop, chest pain, and right arm weakness. CT of the head showed left frontal operculum acute nonhemorrhagic infarction.  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 Orvan Seen.  Intraoperative echocardiogram with ejection fraction of 50%. Follow-up MRI of the brain showed scattered areas of acute infarction in both hemispheres, more extensive on the left than right, consistent with embolic infarctions.  The largest area of involvement 3.5-4 cm stroke in the left frontal operculum with mild swelling and minimal petechial blood products.  Scattered other strokes in the left hemisphere, next largest measuring 2 cm at the left parieto-occipital junction.  Single acute infarction in the right hemisphere measuring less than a centimeter in size in the right frontal lobe.  Acute postoperative pericarditis maintained on colchicine. Pt attended CIR 05/09/19-05/15/19. He was referred for OP SLP therapy evaluation and treatment by Lauraine Rinne, PA-C.       Treatment Provided   Treatment provided  Cognitive-Linquistic      Pain Assessment   Pain Assessment  No/denies pain      Cognitive-Linquistic Treatment   Treatment focused on  Aphasia;Apraxia;Dysarthria;Patient/family/caregiver education  Skilled Treatment  SLP provided skilled treatment targeting apraxia and dysarthria goals during oral reading of /f/ words in the initial and medial position. Speech intelligibility for /f/ initial judged to be 73% and for /f/ medial judged to be 94%. "DG" sounds were targeted in the IMF positions and were produced with approximations (occasionally with /d/ and "ch" and shaped after SLP model). /sp/ initial words were produced with 100% acc. Pt cued  to spell the word when misunderstood and provide a short sentence to support comprehension as needed.      Assessment / Recommendations / Plan   Plan  Continue with current plan of care      Progression Toward Goals   Progression toward goals  Progressing toward goals       SLP Education - 06/06/19 1045    Education Details  Gave deduction puzzle for homework    Person(s) Educated  Patient       SLP Short Term Goals - 06/06/19 1050      SLP SHORT TERM GOAL #1   Title  Pt will implement speech intelligibility strategies at the sentence level (decrease rate, self correct errors, self monitoring, checking to see if listener understands) for 90% intelligibility with min assist.    Baseline  75%    Time  4    Period  Weeks    Status  On-going    Target Date  06/28/19      SLP SHORT TERM GOAL #2   Title  Pt will complete high level verbal expression tasks (object description, state function, provide short summary, etc) using short sentences with 90% acc and min assist.    Baseline  75% acc    Time  4    Period  Weeks    Status  On-going    Target Date  06/28/19      SLP SHORT TERM GOAL #3   Title  Pt will complete short paragraph reading comprehension tasks with 80% acc and min assist via multiple choice response.    Baseline  75% sentence level with multiple choice responses    Time  4    Period  Weeks    Status  On-going    Target Date  06/28/19      SLP SHORT TERM GOAL #4   Title  Pt will verbalize 7+ word sentence using subject, verb, object format when describing pictures with 90% acc with min cues.    Baseline  Pt primarily lists objects during picture description tasks    Time  4    Period  Weeks    Status  On-going    Target Date  06/28/19      SLP SHORT TERM GOAL #5   Title  Pt will complete apraxia drills (nonsense words, multisyllabic words, progressive sentences) with 80% acc with mi/mod assist and use of strategies.    Baseline  70% nonsense words    Time   4    Period  Weeks    Status  On-going    Target Date  06/28/19       SLP Long Term Goals - 06/06/19 1051      SLP LONG TERM GOAL #1   Title  Pt will communicate complex thoughts and feelings to [facility] with use of strategies.    Baseline  moderate impairment    Time  8    Period  Weeks    Status  On-going       Plan - 06/06/19 1047  Clinical Impression Statement  Pt continues to make progress and be highly motivated to improving speech/language skills. 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 He benefited from targeted practice of specific sounds (/f/, sh, dg) with SLP model and verbal cues for lingual placement. Continue targeting apraxia goals next session.    Speech Therapy Frequency  2x / week    Duration  4 weeks    Treatment/Interventions  Compensatory strategies;Cueing hierarchy;Patient/family education;Functional tasks;SLP instruction and feedback;Compensatory techniques;Internal/external aids    Potential to Achieve Goals  Good    Potential Considerations  Severity of impairments    SLP Home Exercise Plan  Pt will complete HEP as assigned to facilitate carryover of treatment strategies and techniques in home environment with assist as needed.    Consulted and Agree with Plan of Care  Patient;Family member/caregiver    Family Member Consulted  Wife, [name]       Patient will benefit from skilled therapeutic intervention in order to improve the following deficits and impairments:   Cognitive communication deficit    Problem List Patient Active Problem List   Diagnosis Date Noted  . Monomorphic ventricular tachycardia ([place]) 05/22/2019  . Aphasia as late effect of cerebrovascular accident (CVA) 05/10/2019  . Dysphagia 05/10/2019  . History of open heart surgery   . Pericarditis   . Acute CVA (cerebrovascular accident) ([place])   . Essential hypertension   . Dyslipidemia   . Tobacco abuse   .  Tachypnea   . Leukocytosis   . Cerebral embolism with cerebral infarction 05/03/2019  . Aortic dissection ([place]) 05/01/2019  . 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 Bilateral hand numbness 02/26/2019  . Decreased grip strength 02/26/2019   Thank you,  Genene Churn, CCC-SLP [phone]  [facility] 06/06/2019, 10:51 AM  [place] [address] [place], [place], [address] Phone: [phone]   Fax:  [phone]   Name: Erik Parker MRN: [id number] Date of Birth: Mar 15, 1965

## 2019-06-11 ENCOUNTER — Other Ambulatory Visit: Payer: Self-pay

## 2019-06-11 ENCOUNTER — Ambulatory Visit (HOSPITAL_COMMUNITY): Payer: 59 | Admitting: Speech Pathology

## 2019-06-11 ENCOUNTER — Encounter (HOSPITAL_COMMUNITY): Payer: Self-pay | Admitting: Speech Pathology

## 2019-06-11 DIAGNOSIS — R41841 Cognitive communication deficit: Secondary | ICD-10-CM

## 2019-06-11 NOTE — Therapy (Signed)
[place], [place], [address] Phone: [phone]   Fax:  [phone]  Speech Language Pathology Treatment  Patient Details  Name: Erik Parker MRN: [id number] Date of Birth: 02-03-1966 Referring Provider (SLP): Lauraine Rinne, PA-C   Encounter Date: 06/11/2019  End of Session - 06/11/19 0945    Visit Number  6    Number of Visits  9    Date for SLP Re-Evaluation  06/28/19    Authorization Type  [employer] NAP   deductible met, no co-pay, 60 visit limit   SLP Start Time  0902    SLP Stop Time   0947    SLP Time Calculation (min)  45 min    Activity Tolerance  Patient tolerated treatment well       Past Medical History:  Diagnosis Date  . Hyperlipidemia   . Hypertension     Past Surgical History:  Procedure Laterality Date  . ACHILLES TENDON SURGERY Right 11/10/2016   Procedure: ACHILLES TENDON REPAIR VIA SPEED BRIDGE;  Surgeon: [name], DPM;  Location: [facility];  Service: Podiatry;  Laterality: Right;  . HEEL SPUR RESECTION Right 11/10/2016   Procedure: HEEL SPUR RESECTION (HAGLUNDS DEFORMITY);  Surgeon: [name], DPM;  Location: [facility];  Service: Podiatry;  Laterality: Right;  . REPAIR OF ACUTE ASCENDING THORACIC AORTIC DISSECTION N/A 05/01/2019   Procedure: REPAIR OF ACUTE ASCENDING THORACIC AORTIC DISSECTION USING HEMOSHIELD GRAFT SIZE 28MM;  Surgeon: Wonda Olds, MD;  Location: [facility] OR;  Service: Cardiothoracic;  Laterality: N/A;    There were no vitals filed for this visit.  Subjective Assessment - 06/11/19 0939    Subjective  "I see the doctor at the end of the month."    Currently in Pain?  No/denies        ADULT SLP TREATMENT - 06/11/19 0943      General Information   Behavior/Cognition  Alert;Cooperative;Pleasant mood    Patient Positioning  Upright in chair    Oral care provided  N/A    HPI  Erik Parker is a 54 year old right-handed male history of hypertension,  hyperlipidemia as well as tobacco abuse. Patient lives with spouse.  Independent prior to admission working full-time.  He presented on 05/01/2019 with right facial droop, chest pain, and right arm weakness. CT of the head showed left frontal operculum acute nonhemorrhagic infarction.  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 Orvan Seen.  Intraoperative echocardiogram with ejection fraction of 50%. Follow-up MRI of the brain showed scattered areas of acute infarction in both hemispheres, more extensive on the left than right, consistent with embolic infarctions.  The largest area of involvement 3.5-4 cm stroke in the left frontal operculum with mild swelling and minimal petechial blood products.  Scattered other strokes in the left hemisphere, next largest measuring 2 cm at the left parieto-occipital junction.  Single acute infarction in the right hemisphere measuring less than a centimeter in size in the right frontal lobe.  Acute postoperative pericarditis maintained on colchicine. Pt attended CIR 05/09/19-05/15/19. He was referred for OP SLP therapy evaluation and treatment by Lauraine Rinne, PA-C.       Treatment Provided   Treatment provided  Cognitive-Linquistic      Pain Assessment   Pain Assessment  No/denies pain      Cognitive-Linquistic Treatment   Treatment  focused on  Aphasia;Apraxia;Dysarthria;Patient/family/caregiver education    Skilled Treatment   SLP provided skilled treatment targeting apraxia and dysarthria goals during production of "er" in isolation and in single words. Pt benefited from visual feedback with the mirror, tactile cues with the tongue depressor, and verbal cues to keep teeth together, smile, and retract tongue. He was most successful in producing "er" medial words with 79% acc. He produced "dg" initial words with 90%  acc. He used single words in sentences with 100% acc and verbalized synonyms for target words with 100% acc with min assist. He was given additional sentence generation activity for homework.        Assessment / Recommendations / Plan   Plan  Continue with current plan of care      Progression Toward Goals   Progression toward goals  Progressing toward goals       SLP Education - 06/11/19 0944    Education Details  Gave synonym/antonym activity for HEP    Person(s) Educated  Patient    Methods  Handout    Comprehension  Verbalized understanding       SLP Short Term Goals - 06/11/19 0948      SLP SHORT TERM GOAL #1   Title  Pt will implement speech intelligibility strategies at the sentence level (decrease rate, self correct errors, self monitoring, checking to see if listener understands) for 90% intelligibility with min assist.    Baseline  75%    Time  4    Period  Weeks    Status  On-going    Target Date  06/28/19      SLP SHORT TERM GOAL #2   Title  Pt will complete high level verbal expression tasks (object description, state function, provide short summary, etc) using short sentences with 90% acc and min assist.    Baseline  75% acc    Time  4    Period  Weeks    Status  On-going    Target Date  06/28/19      SLP SHORT TERM GOAL #3   Title  Pt will complete short paragraph reading comprehension tasks with 80% acc and min assist via multiple choice response.    Baseline  75% sentence level with multiple choice responses    Time  4    Period  Weeks    Status  On-going    Target Date  06/28/19      SLP SHORT TERM GOAL #4   Title  Pt will verbalize 7+ word sentence using subject, verb, object format when describing pictures with 90% acc with min cues.    Baseline  Pt primarily lists objects during picture description tasks    Time  4    Period  Weeks    Status  On-going    Target Date  06/28/19      SLP SHORT TERM GOAL #5   Title  Pt will complete apraxia drills  (nonsense words, multisyllabic words, progressive sentences) with 80% acc with mi/mod assist and use of strategies.    Baseline  70% nonsense words    Time  4    Period  Weeks    Status  On-going    Target Date  06/28/19       SLP Long Term Goals - 06/11/19 1006      SLP LONG TERM GOAL #1   Title  Pt will communicate complex thoughts and feelings to [facility] with use of strategies.    Baseline  moderate impairment    Time  8    Period  Weeks    Status  On-going       Plan - 06/11/19 0946    Clinical Impression Statement  Pt continues to make progress and be highly motivated to improving speech/language skills. He completed HEP with 80% acc (deduction puzzle). He benefited from targeted practice of "er" with SLP model and verbal cues for lingual placement. Continue targeting apraxia goals next session.    Speech Therapy Frequency  2x / week    Duration  4 weeks    Treatment/Interventions  Compensatory strategies;Cueing hierarchy;Patient/family education;Functional tasks;SLP instruction and feedback;Compensatory techniques;Internal/external aids    Potential to Achieve Goals  Good    Potential Considerations  Severity of impairments    SLP Home Exercise Plan  Pt will complete HEP as assigned to facilitate carryover of treatment strategies and techniques in home environment with assist as needed.    Consulted and Agree with Plan of Care  Patient;Family member/caregiver    Family Member Consulted  Wife, [name]       Patient will benefit from skilled therapeutic intervention in order to improve the following deficits and impairments:   Cognitive communication deficit    Problem List Patient Active Problem List   Diagnosis Date Noted  . Monomorphic ventricular tachycardia ([place]) 05/22/2019  . Aphasia as late effect of cerebrovascular accident (CVA) 05/10/2019  . Dysphagia 05/10/2019  . History of open heart surgery   . Pericarditis   . Acute CVA (cerebrovascular accident) ([place])    . Essential hypertension   . Dyslipidemia   . Tobacco abuse   . Tachypnea   . Leukocytosis   . Cerebral embolism with cerebral infarction 05/03/2019  . Aortic dissection ([place]) 05/01/2019  . 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 Bilateral hand numbness 02/26/2019  . Decreased grip strength 02/26/2019   Thank you,  Genene Churn, CCC-SLP [phone]  [place] 06/11/2019, 10:08 AM  [facility] [address] [place], [place], [address] Phone: [phone]   Fax:  [phone]   Name: LOVIE AGRESTA MRN: [id number] Date of Birth: 07-Dec-1965

## 2019-06-13 ENCOUNTER — Ambulatory Visit (HOSPITAL_COMMUNITY): Payer: 59 | Admitting: Speech Pathology

## 2019-06-13 ENCOUNTER — Encounter (HOSPITAL_COMMUNITY): Payer: Self-pay | Admitting: Speech Pathology

## 2019-06-13 ENCOUNTER — Other Ambulatory Visit: Payer: Self-pay

## 2019-06-13 DIAGNOSIS — R41841 Cognitive communication deficit: Secondary | ICD-10-CM | POA: Diagnosis not present

## 2019-06-13 NOTE — Therapy (Signed)
[facility] [address] [place], [place], [address] Phone: [phone]   Fax:  [phone]  Speech Language Pathology Treatment  Patient Details  Name: Erik Parker MRN: [id number] Date of Birth: [birth year] Referring Provider (SLP): Mariam Dollar, PA-C   Encounter Date: 06/13/2019  End of Session - 06/13/19 0921    Visit Number  7    Number of Visits  9    Date for SLP Re-Evaluation  06/28/19    Authorization Type  [employer] NAP    SLP Start Time  0902    SLP Stop Time   0947    SLP Time Calculation (min)  45 min    Activity Tolerance  Patient tolerated treatment well       Past Medical History:  Diagnosis Date  . Hyperlipidemia   . Hypertension     Past Surgical History:  Procedure Laterality Date  . ACHILLES TENDON SURGERY Right 11/10/2016   Procedure: ACHILLES TENDON REPAIR VIA SPEED BRIDGE;  Surgeon: [name], DPM;  Location: [facility];  Service: Podiatry;  Laterality: Right;  . HEEL SPUR RESECTION Right 11/10/2016   Procedure: HEEL SPUR RESECTION (HAGLUNDS DEFORMITY);  Surgeon: [name], DPM;  Location: [facility];  Service: Podiatry;  Laterality: Right;  . REPAIR OF ACUTE ASCENDING THORACIC AORTIC DISSECTION N/A 05/01/2019   Procedure: REPAIR OF ACUTE ASCENDING THORACIC AORTIC DISSECTION USING HEMOSHIELD GRAFT SIZE ;  Surgeon: Linden Dolin, MD;  Location: [facility] OR;  Service: Cardiothoracic;  Laterality: N/A;    There were no vitals filed for this visit.  Subjective Assessment - 06/13/19 0936    Subjective  "I am not pronouncing it right."    Currently in Pain?  No/denies            ADULT SLP TREATMENT - 06/13/19 0920      General Information   Behavior/Cognition  Alert;Cooperative;Pleasant mood    Patient Positioning  Upright in chair    Oral care provided  N/A    HPI  Erik Parker. Phaneuf is a 54 year old right-handed male history of hypertension, hyperlipidemia as well as tobacco abuse. Patient  lives with spouse.  Independent prior to admission working full-time.  He presented on 05/01/2019 with right facial droop, chest pain, and right arm weakness. CT of the head showed left frontal operculum acute nonhemorrhagic infarction.  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 Vickey Sages.  Intraoperative echocardiogram with ejection fraction of 50%. Follow-up MRI of the brain showed scattered areas of acute infarction in both hemispheres, more extensive on the left than right, consistent with embolic infarctions.  The largest area of involvement 3.5-4 cm stroke in the left frontal operculum with mild swelling and minimal petechial blood products.  Scattered other strokes in the left hemisphere, next largest measuring 2 cm at the left parieto-occipital junction.  Single acute infarction in the right hemisphere measuring less than a centimeter in size in the right frontal lobe.  Acute postoperative pericarditis maintained on colchicine. Pt attended CIR 05/09/19-05/15/19. He was referred for OP SLP therapy evaluation and treatment by Mariam Dollar, PA-C.       Treatment Provided   Treatment provided  Cognitive-Linquistic      Pain Assessment   Pain Assessment  No/denies pain      Cognitive-Linquistic Treatment   Treatment focused on  Aphasia;Apraxia;Dysarthria;Patient/family/caregiver education  Skilled Treatment    SLP provided skilled treatment targeting apraxia and dysarthria goals during elicitation of "r" in all positions. Pt had the most difficulty producing "r" in the initial position (prevocalic consonants and consonantal clusters). He tended to produce approximations and/or w/r. Pt benefited from visual feedback with the mirror, tactile cues with the tongue depressor, and verbal cues to keep teeth together, smile, and retract tongue.  He used  single words in sentences with 100% acc. Pt was audio recorded for Pt self evaluation.         Progression Toward Goals   Progression toward goals  Progressing toward goals         SLP Short Term Goals - 06/13/19 0935      SLP SHORT TERM GOAL #1   Title  Pt will implement speech intelligibility strategies at the sentence level (decrease rate, self correct errors, self monitoring, checking to see if listener understands) for 90% intelligibility with min assist.    Baseline  75%    Time  4    Period  Weeks    Status  On-going    Target Date  06/28/19      SLP SHORT TERM GOAL #2   Title  Pt will complete high level verbal expression tasks (object description, state function, provide short summary, etc) using short sentences with 90% acc and min assist.    Baseline  75% acc    Time  4    Period  Weeks    Status  On-going    Target Date  06/28/19      SLP SHORT TERM GOAL #3   Title  Pt will complete short paragraph reading comprehension tasks with 80% acc and min assist via multiple choice response.    Baseline  75% sentence level with multiple choice responses    Time  4    Period  Weeks    Status  On-going    Target Date  06/28/19      SLP SHORT TERM GOAL #4   Title  Pt will verbalize 7+ word sentence using subject, verb, object format when describing pictures with 90% acc with min cues.    Baseline  Pt primarily lists objects during picture description tasks    Time  4    Period  Weeks    Status  On-going    Target Date  06/28/19      SLP SHORT TERM GOAL #5   Title  Pt will complete apraxia drills (nonsense words, multisyllabic words, progressive sentences) with 80% acc with mi/mod assist and use of strategies.    Baseline  70% nonsense words    Time  4    Period  Weeks    Status  On-going    Target Date  06/28/19       SLP Long Term Goals - 06/13/19 0936      SLP LONG TERM GOAL #1   Title  Pt will communicate complex thoughts and feelings to [facility] with use of  strategies.    Baseline  moderate impairment    Time  8    Period  Weeks    Status  On-going       Plan - 06/13/19 0935    Clinical Impression Statement  Pt continues to make progress and be highly motivated to improving speech/language skills. He completed HEP and only had mild difficulties with spelling when writing sentences with targeted words. He was given written reminders on ways to facilitate production of "  r". Continue targeting apraxia goals next session.    Speech Therapy Frequency  2x / week    Duration  4 weeks    Treatment/Interventions  Compensatory strategies;Cueing hierarchy;Patient/family education;Functional tasks;SLP instruction and feedback;Compensatory techniques;Internal/external aids    Potential to Achieve Goals  Good    Potential Considerations  Severity of impairments    SLP Home Exercise Plan  Pt will complete HEP as assigned to facilitate carryover of treatment strategies and techniques in home environment with assist as needed.    Consulted and Agree with Plan of Care  Patient;Family member/caregiver    Family Member Consulted  Wife, [name]       Patient will benefit from skilled therapeutic intervention in order to improve the following deficits and impairments:   Cognitive communication deficit    Problem List Patient Active Problem List   Diagnosis Date Noted  . Monomorphic ventricular tachycardia (HCC) 05/22/2019  . Aphasia as late effect of cerebrovascular accident (CVA) 05/10/2019  . Dysphagia 05/10/2019  . History of open heart surgery   . Pericarditis   . Acute CVA (cerebrovascular accident) (HCC)   . Essential hypertension   . Dyslipidemia   . Tobacco abuse   . Tachypnea   . Leukocytosis   . Cerebral embolism with cerebral infarction 05/03/2019  . Aortic dissection (HCC) 05/01/2019  . 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 Bilateral hand numbness 02/26/2019  . Decreased grip strength 02/26/2019   Thank you,  Havery Moros, CCC-SLP [phone]  [facility] 06/13/2019, 9:59 AM  [facility] [address] [place], [place], [address] Phone: [phone]   Fax:  [phone]   Name: MATTHIAS BOGUS MRN: [id number] Date of Birth: Apr 09, 1965

## 2019-06-14 ENCOUNTER — Telehealth: Payer: Self-pay | Admitting: Physician Assistant

## 2019-06-14 MED ORDER — COLCHICINE 0.6 MG PO TABS: 0.6000 mg | ORAL_TABLET | Freq: Two times a day (BID) | ORAL | 0 refills | Status: DC

## 2019-06-14 MED ORDER — METOPROLOL TARTRATE 25 MG PO TABS: 25.0000 mg | ORAL_TABLET | Freq: Two times a day (BID) | ORAL | 0 refills | Status: DC

## 2019-06-14 MED ORDER — ROSUVASTATIN CALCIUM 20 MG PO TABS: 20.0000 mg | ORAL_TABLET | Freq: Every day | ORAL | 0 refills | Status: DC

## 2019-06-14 NOTE — Telephone Encounter (Signed)
colchicine 0.6 MG tablet [[id number]]   metoprolol tartrate (LOPRESSOR) 25 MG tablet [[id number]]   rosuvastatin (CRESTOR) 20 MG tablet [[id number]]   [facility]- pt was put on these meds while in the hospital and he's needing refills   Has apt w/ [facility] on 4/23

## 2019-06-14 NOTE — Telephone Encounter (Signed)
Refill sent.

## 2019-06-15 ENCOUNTER — Telehealth: Payer: Self-pay

## 2019-06-15 MED ORDER — COLCHICINE 0.6 MG PO TABS: 0.6000 mg | ORAL_TABLET | Freq: Two times a day (BID) | ORAL | 0 refills | Status: DC

## 2019-06-15 MED ORDER — METOPROLOL TARTRATE 25 MG PO TABS: 25.0000 mg | ORAL_TABLET | Freq: Two times a day (BID) | ORAL | 0 refills | Status: DC

## 2019-06-15 MED ORDER — ROSUVASTATIN CALCIUM 20 MG PO TABS: 20.0000 mg | ORAL_TABLET | Freq: Every day | ORAL | 0 refills | Status: DC

## 2019-06-18 ENCOUNTER — Ambulatory Visit (HOSPITAL_COMMUNITY)
Admission: RE | Admit: 2019-06-18 | Discharge: 2019-06-18 | Disposition: A | Discharge status: Home / Self Care | Payer: 59 | Source: Ambulatory Visit | Attending: Physician Assistant | Admitting: Physician Assistant

## 2019-06-18 ENCOUNTER — Other Ambulatory Visit: Payer: Self-pay

## 2019-06-18 ENCOUNTER — Institutional Professional Consult (permissible substitution): Payer: 59 | Admitting: Neurology

## 2019-06-18 DIAGNOSIS — I7101 Dissection of thoracic aorta: Secondary | ICD-10-CM | POA: Insufficient documentation

## 2019-06-18 DIAGNOSIS — I71019 Dissection of thoracic aorta, unspecified: Secondary | ICD-10-CM

## 2019-06-18 NOTE — Progress Notes (Signed)
*  PRELIMINARY RESULTS* Echocardiogram 2D Echocardiogram has been performed.  [name] 06/18/2019, 9:32 AM

## 2019-06-19 ENCOUNTER — Encounter (HOSPITAL_COMMUNITY): Payer: Self-pay | Admitting: Speech Pathology

## 2019-06-19 ENCOUNTER — Other Ambulatory Visit: Payer: Self-pay

## 2019-06-19 ENCOUNTER — Ambulatory Visit (HOSPITAL_COMMUNITY): Payer: 59 | Admitting: Speech Pathology

## 2019-06-19 DIAGNOSIS — R41841 Cognitive communication deficit: Secondary | ICD-10-CM | POA: Diagnosis not present

## 2019-06-19 NOTE — Therapy (Signed)
[facility] [address] [place], [place], [address] Phone: [phone]   Fax:  [phone]  Speech Language Pathology Treatment  Patient Details  Name: Erik Parker MRN: [id number] Date of Birth: [birth year] Referring Provider (SLP): Mariam Dollar, PA-C   Encounter Date: 06/19/2019  End of Session - 06/19/19 1634    Visit Number  8    Number of Visits  9    Date for SLP Re-Evaluation  06/28/19    Authorization Type  [employer] NAP    SLP Start Time  1605    SLP Stop Time   1650    SLP Time Calculation (min)  45 min    Activity Tolerance  Patient tolerated treatment well       Past Medical History:  Diagnosis Date  . Hyperlipidemia   . Hypertension     Past Surgical History:  Procedure Laterality Date  . ACHILLES TENDON SURGERY Right 11/10/2016   Procedure: ACHILLES TENDON REPAIR VIA SPEED BRIDGE;  Surgeon: [name], DPM;  Location: [facility];  Service: Podiatry;  Laterality: Right;  . HEEL SPUR RESECTION Right 11/10/2016   Procedure: HEEL SPUR RESECTION (HAGLUNDS DEFORMITY);  Surgeon: [name], DPM;  Location: [facility];  Service: Podiatry;  Laterality: Right;  . REPAIR OF ACUTE ASCENDING THORACIC AORTIC DISSECTION N/A 05/01/2019   Procedure: REPAIR OF ACUTE ASCENDING THORACIC AORTIC DISSECTION USING HEMOSHIELD GRAFT SIZE ;  Surgeon: Linden Dolin, MD;  Location: [facility] OR;  Service: Cardiothoracic;  Laterality: N/A;    There were no vitals filed for this visit.  Subjective Assessment - 06/19/19 1618    Subjective  "My brother said I am doing a whole lot better."    Currently in Pain?  No/denies        ADULT SLP TREATMENT - 06/19/19 0001      General Information   Behavior/Cognition  Alert;Cooperative;Pleasant mood    Patient Positioning  Upright in chair    Oral care provided  N/A    HPI  Dardan Shelton. [place] is a 54 year old right-handed male history of hypertension, hyperlipidemia as well as tobacco abuse.  Patient lives with spouse.  Independent prior to admission working full-time.  He presented on 05/01/2019 with right facial droop, chest pain, and right arm weakness. CT of the head showed left frontal operculum acute nonhemorrhagic infarction.  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 Vickey Sages.  Intraoperative echocardiogram with ejection fraction of 50%. Follow-up MRI of the brain showed scattered areas of acute infarction in both hemispheres, more extensive on the left than right, consistent with embolic infarctions.  The largest area of involvement 3.5-4 cm stroke in the left frontal operculum with mild swelling and minimal petechial blood products.  Scattered other strokes in the left hemisphere, next largest measuring 2 cm at the left parieto-occipital junction.  Single acute infarction in the right hemisphere measuring less than a centimeter in size in the right frontal lobe.  Acute postoperative pericarditis maintained on colchicine. Pt attended CIR 05/09/19-05/15/19. He was referred for OP SLP therapy evaluation and treatment by Mariam Dollar, PA-C.       Treatment Provided   Treatment provided  Cognitive-Linquistic      Pain Assessment   Pain Assessment  No/denies pain      Cognitive-Linquistic Treatment   Treatment focused on  Aphasia;Apraxia;Dysarthria;Patient/family/caregiver education  Skilled Treatment SLP provided skilled SLP treatment targeting apraxia and dysarthria goals during elicitation of "ir" words and sentences and also oral reading and repetition of multisyllabic words. He produced "er" words in the medial position (set list) with 100% acc with allowance for self corrections and used each word in a sentence with 100% acc. He produced /r/ initial words with 90% acc. He repeated multisyllabic words with 90% intelligibility and  83% acc.        Assessment / Recommendations / Plan   Plan  Continue with current plan of care         SLP Short Term Goals - 06/19/19 1635      SLP SHORT TERM GOAL #1   Title  Pt will implement speech intelligibility strategies at the sentence level (decrease rate, self correct errors, self monitoring, checking to see if listener understands) for 90% intelligibility with min assist.    Baseline  75%    Time  4    Period  Weeks    Status  On-going    Target Date  06/28/19      SLP SHORT TERM GOAL #2   Title  Pt will complete high level verbal expression tasks (object description, state function, provide short summary, etc) using short sentences with 90% acc and min assist.    Baseline  75% acc    Time  4    Period  Weeks    Status  On-going    Target Date  06/28/19      SLP SHORT TERM GOAL #3   Title  Pt will complete short paragraph reading comprehension tasks with 80% acc and min assist via multiple choice response.    Baseline  75% sentence level with multiple choice responses    Time  4    Period  Weeks    Status  On-going    Target Date  06/28/19      SLP SHORT TERM GOAL #4   Title  Pt will verbalize 7+ word sentence using subject, verb, object format when describing pictures with 90% acc with min cues.    Baseline  Pt primarily lists objects during picture description tasks    Time  4    Period  Weeks    Status  On-going    Target Date  06/28/19      SLP SHORT TERM GOAL #5   Title  Pt will complete apraxia drills (nonsense words, multisyllabic words, progressive sentences) with 80% acc with mi/mod assist and use of strategies.    Baseline  70% nonsense words    Time  4    Period  Weeks    Status  On-going    Target Date  06/28/19       SLP Long Term Goals - 06/19/19 1636      SLP LONG TERM GOAL #1   Title  Pt will communicate complex thoughts and feelings to [facility] with use of strategies.    Baseline  moderate impairment    Time  8    Period  Weeks     Status  On-going       Plan - 06/19/19 1635    Clinical Impression Statement  Pt continues to make progress and be highly motivated to improving speech/language skills. He indicates that he does not have difficulty with word finding and this his deficit seems to be purely speech at this point. Continue targeting apraxia goals next session and check auditory comprehension in order to update goals.  Speech Therapy Frequency  2x / week    Duration  4 weeks    Treatment/Interventions  Compensatory strategies;Cueing hierarchy;Patient/family education;Functional tasks;SLP instruction and feedback;Compensatory techniques;Internal/external aids    Potential to Achieve Goals  Good    Potential Considerations  Severity of impairments    SLP Home Exercise Plan  Pt will complete HEP as assigned to facilitate carryover of treatment strategies and techniques in home environment with assist as needed.    Consulted and Agree with Plan of Care  Patient;Family member/caregiver    Family Member Consulted  Wife, [name]       Patient will benefit from skilled therapeutic intervention in order to improve the following deficits and impairments:   Cognitive communication deficit    Problem List Patient Active Problem List   Diagnosis Date Noted  . Monomorphic ventricular tachycardia ([place]) 05/22/2019  . Aphasia as late effect of cerebrovascular accident (CVA) 05/10/2019  . Dysphagia 05/10/2019  . History of open heart surgery   . Pericarditis   . Acute CVA (cerebrovascular accident) ([place])   . Essential hypertension   . Dyslipidemia   . Tobacco abuse   . Tachypnea   . Leukocytosis   . Cerebral embolism with cerebral infarction 05/03/2019  . Aortic dissection ([place]) 05/01/2019  . 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 Bilateral hand numbness 02/26/2019  . Decreased grip strength 02/26/2019   Thank you,  Genene Churn, CCC-SLP [phone]  [facility] 06/19/2019, 4:36 PM  [place] [address] [place], [place], [address] Phone: [phone]   Fax:  [phone]   Name: ARIEZ NEILAN MRN: [id number] Date of Birth: May 22, 1965

## 2019-06-20 ENCOUNTER — Encounter (HOSPITAL_COMMUNITY): Payer: Self-pay | Admitting: Speech Pathology

## 2019-06-20 ENCOUNTER — Ambulatory Visit (HOSPITAL_COMMUNITY): Payer: 59 | Admitting: Speech Pathology

## 2019-06-20 DIAGNOSIS — I69890 Apraxia following other cerebrovascular disease: Secondary | ICD-10-CM

## 2019-06-20 DIAGNOSIS — R41841 Cognitive communication deficit: Secondary | ICD-10-CM | POA: Diagnosis not present

## 2019-06-20 DIAGNOSIS — R471 Dysarthria and anarthria: Secondary | ICD-10-CM

## 2019-06-20 NOTE — Therapy (Signed)
[facility] [address] [place], [place], [address] Phone: [phone]   Fax:  [phone]  Speech Language Pathology Treatment  Patient Details  Name: Erik Parker MRN: [id number] Date of Birth: 12/25/1965 Referring Provider (SLP): Mariam Dollar, PA-C   Encounter Date: 06/20/2019  End of Session - 06/20/19 0915    Visit Number  9    Number of Visits  9    Date for SLP Re-Evaluation  06/28/19    Authorization Type  [employer] NAP    SLP Start Time  0904    SLP Stop Time   0948    SLP Time Calculation (min)  44 min    Activity Tolerance  Patient tolerated treatment well       Past Medical History:  Diagnosis Date  . Hyperlipidemia   . Hypertension     Past Surgical History:  Procedure Laterality Date  . ACHILLES TENDON SURGERY Right 11/10/2016   Procedure: ACHILLES TENDON REPAIR VIA SPEED BRIDGE;  Surgeon: [name], DPM;  Location: [facility];  Service: Podiatry;  Laterality: Right;  . HEEL SPUR RESECTION Right 11/10/2016   Procedure: HEEL SPUR RESECTION (HAGLUNDS DEFORMITY);  Surgeon: [name], DPM;  Location: [facility];  Service: Podiatry;  Laterality: Right;  . REPAIR OF ACUTE ASCENDING THORACIC AORTIC DISSECTION N/A 05/01/2019   Procedure: REPAIR OF ACUTE ASCENDING THORACIC AORTIC DISSECTION USING HEMOSHIELD GRAFT SIZE ;  Surgeon: Linden Dolin, MD;  Location: [facility] OR;  Service: Cardiothoracic;  Laterality: N/A;    There were no vitals filed for this visit.  Subjective Assessment - 06/20/19 0913    Subjective  "I'm alright."    Currently in Pain?  No/denies       ADULT SLP TREATMENT - 06/20/19 0914      General Information   Behavior/Cognition  Alert;Cooperative;Pleasant mood    Patient Positioning  Upright in chair    Oral care provided  N/A    HPI  Erik Parker. Shavers is a 54 year old right-handed male history of hypertension, hyperlipidemia as well as tobacco abuse. Patient lives with spouse.   Independent prior to admission working full-time.  He presented on 05/01/2019 with right facial droop, chest pain, and right arm weakness. CT of the head showed left frontal operculum acute nonhemorrhagic infarction.  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 Vickey Sages.  Intraoperative echocardiogram with ejection fraction of 50%. Follow-up MRI of the brain showed scattered areas of acute infarction in both hemispheres, more extensive on the left than right, consistent with embolic infarctions.  The largest area of involvement 3.5-4 cm stroke in the left frontal operculum with mild swelling and minimal petechial blood products.  Scattered other strokes in the left hemisphere, next largest measuring 2 cm at the left parieto-occipital junction.  Single acute infarction in the right hemisphere measuring less than a centimeter in size in the right frontal lobe.  Acute postoperative pericarditis maintained on colchicine. Pt attended CIR 05/09/19-05/15/19. He was referred for OP SLP therapy evaluation and treatment by Mariam Dollar, PA-C.       Treatment Provided   Treatment provided  Cognitive-Linquistic      Pain Assessment   Pain Assessment  No/denies pain      Cognitive-Linquistic Treatment   Treatment focused on  Aphasia;Apraxia;Dysarthria;Patient/family/caregiver education    Skilled Treatment  SLP provided skilled SLP treatment targeting  apraxia and dysarthria goals during elicitation of "ir" words and sentences and also oral reading and repetition of multisyllabic words. He produced "er" words in the medial position (set list) with 100% acc with allowance for self corrections and used each word in a sentence with 100% acc. He produced /r/ initial words with 90% acc. He repeated multisyllabic words with 90% intelligibility and 83% acc.          Assessment / Recommendations / Plan   Plan  Continue with current plan of care         SLP Short Term Goals - 06/20/19 0915      SLP SHORT TERM GOAL #1   Title  Pt will implement speech intelligibility strategies at the sentence level (decrease rate, self correct errors, self monitoring, checking to see if listener understands) for 90% intelligibility with min assist.    Baseline  75%    Time  4    Period  Weeks    Status  Achieved    Target Date  06/28/19      SLP SHORT TERM GOAL #2   Title  Pt will complete high level verbal expression tasks (object description, state function, provide short summary, etc) using short sentences with 90% acc and min assist.    Baseline  75% acc    Time  4    Period  Weeks    Status  Achieved    Target Date  06/28/19      SLP SHORT TERM GOAL #3   Title  Pt will complete short paragraph reading comprehension tasks with 80% acc and min assist via multiple choice response.    Baseline  75% sentence level with multiple choice responses    Time  4    Period  Weeks    Status  Achieved    Target Date  06/28/19      SLP SHORT TERM GOAL #4   Title  Pt will verbalize 7+ word sentence using subject, verb, object format when describing pictures with 90% acc with min cues.    Baseline  Pt primarily lists objects during picture description tasks    Time  4    Period  Weeks    Status  Achieved    Target Date  06/28/19      SLP SHORT TERM GOAL #5   Title  Pt will complete apraxia drills (nonsense words, multisyllabic words, progressive sentences) with 80% acc with mi/mod assist and use of strategies.    Baseline  70% nonsense words    Time  4    Period  Weeks    Status  Achieved    Target Date  06/28/19      Additional Short Term Goals   Additional Short Term Goals  Yes      SLP SHORT TERM GOAL #6   Title  Pt will implement speech intelligibility strategies at the conversation level (decrease rate, self correct errors, self monitoring, checking to  see if listener understands) for 90% intelligibility with allowance for self correction after SLP queries.    Baseline  75% conversation    Time  4    Period  Weeks    Status  New    Target Date  07/31/19      SLP SHORT TERM GOAL #7   Title  Pt will complete apraxia drills (nonsense words, multisyllabic words, progressive sentences) with 90% acc with min assist and use of strategies.    Baseline  90% mod assist  Time  4    Period  Weeks    Status  New    Target Date  07/31/19      SLP SHORT TERM GOAL #8   Title  Pt will produce /r/ in all positions of single word productions with 90% accuracy during oral reading or repetition of single words.    Baseline  75% with approximations    Time  4    Period  Weeks    Status  New    Target Date  07/31/19       SLP Long Term Goals - 06/20/19 1117      SLP LONG TERM GOAL #1   Title  Pt will communicate complex thoughts and feelings to [facility] with use of strategies.    Baseline  moderate impairment    Time  8    Period  Weeks    Status  On-going    Target Date  07/31/19       Plan - 06/20/19 0915    Clinical Impression Statement  Pt continues to make progress and be highly motivated to improving speech/language skills. Goals above have been updated. 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 He completes HEP as assigned daily. He benefits from verbal and visual cues to assist with lingual placement for /r/ and blends. Continue targeting expressive speech going forward.    Speech Therapy Frequency  2x / week    Duration  4 weeks    Treatment/Interventions  Compensatory strategies;Cueing hierarchy;Patient/family education;Functional tasks;SLP instruction and feedback;Compensatory techniques;Internal/external aids    Potential to Achieve Goals  Good    Potential Considerations  Severity of impairments    SLP Home Exercise Plan  Pt will complete HEP as  assigned to facilitate carryover of treatment strategies and techniques in home environment with assist as needed.    Consulted and Agree with Plan of Care  Patient;Family member/caregiver    Family Member Consulted  Wife, [name]       Patient will benefit from skilled therapeutic intervention in order to improve the following deficits and impairments:   Cognitive communication deficit  Dysarthria and anarthria  Apraxia following other cerebrovascular disease    Problem List Patient Active Problem List   Diagnosis Date Noted  . Monomorphic ventricular tachycardia ([place]) 05/22/2019  . Aphasia as late effect of cerebrovascular accident (CVA) 05/10/2019  . Dysphagia 05/10/2019  . History of open heart surgery   . Pericarditis   . Acute CVA (cerebrovascular accident) ([place])   . Essential hypertension   . Dyslipidemia   . Tobacco abuse   . Tachypnea   . Leukocytosis   . Cerebral embolism with cerebral infarction 05/03/2019  . Aortic dissection ([place]) 05/01/2019  . 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 Bilateral hand numbness 02/26/2019  . Decreased grip strength 02/26/2019   Speech Therapy Progress Note  Dates of Reporting Period: 05/24/19 to 06/21/19  Objective Reports of Subjective Statement: See goal update above  Goal Update: See above, new goals added  Plan: 8 more session (2x/week for 4 weeks)  Reason Skilled Services are Required: Pt has made great progress toward cognitive linguistic goals and remains highly motivated with good family support. He has a strong desire to return to work, but needs his speech to be more intelligible in order to do so.    Thank you,  Genene Churn, [place]  [place] 06/20/2019, 11:19 AM  [facility] [address] [place], [place], [address] Phone: [phone]   Fax:  [phone]   Name: TAYSHON WINKER MRN: [id number] Date of Birth: [birth year]

## 2019-06-21 ENCOUNTER — Encounter: Payer: Self-pay | Admitting: Neurology

## 2019-06-21 ENCOUNTER — Ambulatory Visit (INDEPENDENT_AMBULATORY_CARE_PROVIDER_SITE_OTHER): Payer: 59 | Admitting: Neurology

## 2019-06-21 ENCOUNTER — Other Ambulatory Visit: Payer: Self-pay

## 2019-06-21 VITALS — BP 133/81 | HR 79 | Temp 97.0°F | Ht 72.0 in | Wt 254.0 lb

## 2019-06-21 DIAGNOSIS — I63413 Cerebral infarction due to embolism of bilateral middle cerebral arteries: Secondary | ICD-10-CM | POA: Diagnosis not present

## 2019-06-21 DIAGNOSIS — R471 Dysarthria and anarthria: Secondary | ICD-10-CM | POA: Diagnosis not present

## 2019-06-21 DIAGNOSIS — I7101 Dissection of thoracic aorta: Secondary | ICD-10-CM

## 2019-06-21 DIAGNOSIS — I71019 Dissection of thoracic aorta, unspecified: Secondary | ICD-10-CM

## 2019-06-21 NOTE — Progress Notes (Signed)
[facility] [address] [place]. [place] [address] [phone]       OFFICE FOLLOW-UP NOTE  [name] Date of Birth:  12-05-1965 Medical Record Number:  [id number]   HPI: [name] is a 54 year old male seen today for initial office follow-up visit following hospital consultation for strokes. History is obtained from his patient and his wife as well as review of electronic medical records and I personally reviewed imaging films in PACS . He is a 54 year old male with history of hypertension, hyperlipidemia and obesity who presented to the emergency room on 05/01/2019 with sudden onset of facial droop and speech difficulties and dysarthria and right arm weakness. He was seen by telemetry neurology and CT scan of the head showed a acute nonhemorrhagic left frontal opercular infarct with NIH stroke scale of 7. CT angiogram showed dissection of the aorta and after confirmatory tests he was taken for emergent urgent repair of Stanford type a dissection involving left carotid bulb and radial graphic string sign involving right common carotid artery. He underwent emergent cardiothoracic surgery for repair. CT perfusion was also done which appeared to be unreliable due to the dissection.Marland Kitchen MRI scan of the brain showed scattered areas of acute infarction involving both hemispheres more extensive on the left than the right with the largest area being 3.5 to 4 cm in the left frontal operculum. LDL cholesterol is 88 mg percent hemoglobin A1c was 5.6. He was started on aspirin after his procedure and Mevacor for his lipids but subsequently has been switched to Crestor. He is currently getting outpatient speech therapy in [place] and is able to speak but certain words are not clear. He has trouble controlling his tongue and right lower half of his face. He also has some numbness in that area. He has no physical weakness in his extremities and gait and balance are not affected. His blood  pressure is better controlled and it is 133/81 today. Is tolerating Crestor well without muscle aches and pains.  ROS:   14 system review of systems is positive for slurred speech, facial weakness, difficulty talking, chest pain and all other systems negative PMH:  Past Medical History:  Diagnosis Date  . Hyperlipidemia   . Hypertension   . Stroke [facility])     Social History:  Social History   Socioeconomic History  . Marital status: Single    Spouse name: Not on file  . Number of children: Not on file  . Years of education: Not on file  . Highest education level: Not on file  Occupational History  . Not on file  Tobacco Use  . Smoking status: Former Smoker    Packs/day: 0.50    Years: 9.00    Pack years: 4.50    Types: Cigarettes    Quit date: 05/01/2019    Years since quitting: 0.1  . Smokeless tobacco: Never Used  Substance and Sexual Activity  . Alcohol use: Yes    Alcohol/week: 1.0 standard drinks    Types: 1 Cans of beer per week    Comment: occ  . Drug use: No  . Sexual activity: Yes    Partners: Female    Birth control/protection: Pill  Other Topics Concern  . Not on file  Social History Narrative  . Not on file   Social Determinants of Health   Financial Resource Strain:   . Difficulty of Paying Living Expenses:   Food Insecurity:   . Worried About Charity fundraiser in  the Last Year:   . Ran Out of Food in the Last Year:   Transportation Needs:   . Freight forwarder (Medical):   Marland Kitchen Lack of Transportation (Non-Medical):   Physical Activity:   . Days of Exercise per Week:   . Minutes of Exercise per Session:   Stress:   . Feeling of Stress :   Social Connections:   . Frequency of Communication with Friends and Family:   . Frequency of Social Gatherings with Friends and Family:   . Attends Religious Services:   . Active Member of Clubs or Organizations:   . Attends Banker Meetings:   Marland Kitchen Marital Status:   Intimate Partner  Violence:   . Fear of Current or Ex-Partner:   . Emotionally Abused:   Marland Kitchen Physically Abused:   . Sexually Abused:     Medications:   Current Outpatient Medications on File Prior to Visit  Medication Sig Dispense Refill  . acetaminophen (TYLENOL) 325 MG tablet Take 1-2 tablets (325-650 mg total) by mouth every 4 (four) hours as needed for mild pain.    Marland Kitchen amantadine (SYMMETREL) 100 MG capsule Take 2 capsules (200 mg total) by mouth daily. 60 capsule 1  . amLODipine (NORVASC) 10 MG tablet amlodipine 10 mg tablet    . aspirin EC 325 MG EC tablet Take 1 tablet (325 mg total) by mouth daily. 30 tablet 0  . bisacodyl (DULCOLAX) 5 MG EC tablet Take 2 tablets (10 mg total) by mouth daily. 30 tablet 0  . colchicine 0.6 MG tablet Take 1 tablet (0.6 mg total) by mouth 2 (two) times daily. 60 tablet 0  . folic acid (FOLVITE) 1 MG tablet Take 1 tablet (1 mg total) by mouth daily. 30 tablet 0  . metoprolol tartrate (LOPRESSOR) 25 MG tablet Take 1 tablet (25 mg total) by mouth 2 (two) times daily. 60 tablet 0  . pantoprazole (PROTONIX) 40 MG tablet Take 1 tablet (40 mg total) by mouth daily. 30 tablet 0  . rosuvastatin (CRESTOR) 20 MG tablet Take 1 tablet (20 mg total) by mouth daily at 6 PM. 30 tablet 0  . traMADol (ULTRAM) 50 MG tablet Take 1-2 tablets (50-100 mg total) by mouth every 4 (four) hours as needed for moderate pain. 30 tablet 0  . traZODone (DESYREL) 50 MG tablet Take 1 tablet (50 mg total) by mouth at bedtime. 20 tablet 0   No current facility-administered medications on file prior to visit.    Allergies:  No Known Allergies  Physical Exam General: Obese middle-aged male, seated, in no evident distress Head: head normocephalic and atraumatic.  Neck: supple with no carotid or supraclavicular bruits Cardiovascular: regular rate and rhythm, no murmurs Musculoskeletal: no deformity Skin:  no rash/petichiae Vascular:  Normal pulses all extremities Vitals:   06/21/19 1438  BP: 133/81    Pulse: 79  Temp: (!) 97 F (36.1 C)   Neurologic Exam Mental Status: Awake and fully alert. Oriented to place and time. Recent and remote memory intact. Attention span, concentration and fund of knowledge appropriate. Mood and affect appropriate. Dysarthric speech and can be understood with some difficulty. Able to name, repeat and comprehend well Cranial Nerves: Fundoscopic exam reveals sharp disc margins. Pupils equal, briskly reactive to light. Extraocular movements full without nystagmus. Visual fields full to confrontation. Hearing intact. Facial sensation intact. Right lower facial weakness., tongue, palate moves normally and symmetrically.  Motor: Normal bulk and tone. Normal strength in all tested extremity muscles. Sensory.:  intact to touch ,pinprick .position and vibratory sensation.  Coordination: Rapid alternating movements normal in all extremities. Finger-to-nose and heel-to-shin performed accurately bilaterally. Gait and Station: Arises from chair without difficulty. Stance is normal. Gait demonstrates normal stride length and balance . Able to heel, toe and tandem walk without difficulty.  Reflexes: 1+ and symmetric. Toes downgoing.   NIHSS  3 Modified Rankin  2   ASSESSMENT: 54 year old gentleman with bicerebral embolic strokes from aortic dissection in March 2021 status post surgical repair who is doing quite well except residual significant dysarthria which also appears to be improving. Vascular risk factors of obesity, hypertension and hyperlipidemia     PLAN: I had a long d/w patient about his recent bilateral embolic strokes from aortic dissection and his residual dysarthria, risk for recurrent stroke/TIAs, personally independently reviewed imaging studies and stroke evaluation results and answered questions.Continue aspirin 325 mg daily  for secondary stroke prevention and maintain strict control of hypertension with blood pressure goal below 130/90, diabetes with  hemoglobin A1c goal below 6.5% and lipids with LDL cholesterol goal below 70 mg/dL. I also advised the patient to eat a healthy diet with plenty of whole grains, cereals, fruits and vegetables, exercise regularly and maintain ideal body weight.  Continue ongoing outpatient speech therapy.  He may begin doing light exercises continue scheduled follow-up appointment with cardiothoracic surgery. followup in the future with my nurse practitioner in 3 months or call earlier if necessary. Greater than 50% of time during this 25 minute visit was spent on counseling,explanation of diagnosis of aortic dissection and embolic strokes, planning of further management, discussion with patient and family and coordination of care Delia Heady, MD  [facility] [address] [place], [place] [address]  Phone [phone] Fax [phone] Note: This document was prepared with digital dictation and possible smart phrase technology. Any transcriptional errors that result from this process are unintentional

## 2019-06-21 NOTE — Patient Instructions (Signed)
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 I also advised the patient to eat a healthy diet with plenty of whole grains, cereals, fruits and vegetables, exercise regularly and maintain ideal body weight.  Continue ongoing outpatient speech therapy.  He may begin doing light exercises continue scheduled follow-up appointment with cardiothoracic surgery. followup in the future with my nurse practitioner in 3 months or call earlier if necessary.  Stroke Prevention Some medical conditions and behaviors are associated with a higher chance of having a stroke. You can help prevent a stroke by making nutrition, lifestyle, and other changes, including managing any medical conditions you may have. What nutrition changes can be made?   Eat healthy foods. You can do this by: ? Choosing foods high in fiber, such as fresh fruits and vegetables and whole grains. ? Eating at least 5 or more servings of fruits and vegetables a day. Try to fill half of your plate at each meal with fruits and vegetables. ? Choosing lean protein foods, such as lean cuts of meat, poultry without skin, fish, tofu, beans, and nuts. ? Eating low-fat dairy products. ? Avoiding foods that are high in salt (sodium). This can help lower blood pressure. ? Avoiding foods that have saturated fat, trans fat, and cholesterol. This can help prevent high cholesterol. ? Avoiding processed and premade foods.  Follow your health care provider's specific guidelines for losing weight, controlling high blood  pressure (hypertension), lowering high cholesterol, and managing diabetes. These may include: ? Reducing your daily calorie intake. ? Limiting your daily sodium intake to 1,500 milligrams (mg). ? Using only healthy fats for cooking, such as olive oil, canola oil, or sunflower oil. ? Counting your daily carbohydrate intake. What lifestyle changes can be made?  Maintain a healthy weight. Talk to your health care provider about your ideal weight.  Get at least 30 minutes of moderate physical activity at least 5 days a week. Moderate activity includes brisk walking, biking, and swimming.  Do not use any products that contain nicotine or tobacco, such as cigarettes and e-cigarettes. If you need help quitting, ask your health care provider. It may also be helpful to avoid exposure to secondhand smoke.  Limit alcohol intake to no more than 1 drink a day for nonpregnant women and 2 drinks a day for men. One drink equals 12 oz of beer, 5 oz of wine, or 1 oz of hard liquor.  Stop any illegal drug use.  Avoid taking birth control pills. Talk to your health care provider about the risks of taking birth control pills if: ? You are over 54 years old. ? You smoke. ? You get migraines. ? You have ever had a blood clot. What other changes can be made?  Manage your cholesterol levels. ? Eating a healthy diet is important for preventing high cholesterol. If cholesterol cannot be managed through diet alone, you may also need to take medicines. ? Take any prescribed medicines to control your cholesterol as told by your health care provider.  Manage your diabetes. ? Eating a healthy diet and exercising regularly are important parts of managing your blood  sugar. If your blood sugar cannot be managed through diet and exercise, you may need to take medicines. ? Take any prescribed medicines to control your diabetes as told by your health care provider.  Control your hypertension. ? To reduce your risk of  stroke, try to keep your blood pressure below 130/80. ? Eating a healthy diet and exercising regularly are an important part of controlling your blood pressure. If your blood pressure cannot be managed through diet and exercise, you may need to take medicines. ? Take any prescribed medicines to control hypertension as told by your health care provider. ? Ask your health care provider if you should monitor your blood pressure at home. ? Have your blood pressure checked every year, even if your blood pressure is normal. Blood pressure increases with age and some medical conditions.  Get evaluated for sleep disorders (sleep apnea). Talk to your health care provider about getting a sleep evaluation if you snore a lot or have excessive sleepiness.  Take over-the-counter and prescription medicines only as told by your health care provider. Aspirin or blood thinners (antiplatelets or anticoagulants) may be recommended to reduce your risk of forming blood clots that can lead to stroke.  Make sure that any other medical conditions you have, such as atrial fibrillation or atherosclerosis, are managed. What are the warning signs of a stroke? The warning signs of a stroke can be easily remembered as BEFAST.  B is for balance. Signs include: ? Dizziness. ? Loss of balance or coordination. ? Sudden trouble walking.  E is for eyes. Signs include: ? A sudden change in vision. ? Trouble seeing.  F is for face. Signs include: ? Sudden weakness or numbness of the face. ? The face or eyelid drooping to one side.  A is for arms. Signs include: ? Sudden weakness or numbness of the arm, usually on one side of the body.  S is for speech. Signs include: ? Trouble speaking (aphasia). ? Trouble understanding.  T is for time. ? These symptoms may represent a serious problem that is an emergency. Do not wait to see if the symptoms will go away. Get medical help right away. Call your local emergency services  (911 in the [place] Do not drive yourself to the hospital.  Other signs of stroke may include: ? A sudden, severe headache with no known cause. ? Nausea or vomiting. ? Seizure. Where to find more information For more information, visit:  American Stroke Association: www.strokeassociation.org  National Stroke Association: www.stroke.org Summary  You can prevent a stroke by eating healthy, exercising, not smoking, limiting alcohol intake, and managing any medical conditions you may have.  Do not use any products that contain nicotine or tobacco, such as cigarettes and e-cigarettes. If you need help quitting, ask your health care provider. It may also be helpful to avoid exposure to secondhand smoke.  Remember BEFAST for warning signs of stroke. Get help right away if you or a loved one has any of these signs. This information is not intended to replace advice given to you by your health care provider. Make sure you discuss any questions you have with your health care provider. Document Revised: 01/28/2017 Document Reviewed: 03/23/2016 Elsevier Patient Education  2020 [employer].

## 2019-06-22 ENCOUNTER — Encounter: Payer: Self-pay | Admitting: Cardiology

## 2019-06-22 ENCOUNTER — Ambulatory Visit (INDEPENDENT_AMBULATORY_CARE_PROVIDER_SITE_OTHER): Payer: 59 | Admitting: Cardiology

## 2019-06-22 VITALS — BP 110/66 | HR 70 | Temp 98.7°F | Ht 72.0 in | Wt 254.0 lb

## 2019-06-22 DIAGNOSIS — I10 Essential (primary) hypertension: Secondary | ICD-10-CM

## 2019-06-22 DIAGNOSIS — I7101 Dissection of thoracic aorta: Secondary | ICD-10-CM

## 2019-06-22 DIAGNOSIS — I639 Cerebral infarction, unspecified: Secondary | ICD-10-CM

## 2019-06-22 DIAGNOSIS — I4729 Other ventricular tachycardia: Secondary | ICD-10-CM

## 2019-06-22 DIAGNOSIS — I472 Ventricular tachycardia: Secondary | ICD-10-CM

## 2019-06-22 DIAGNOSIS — I71019 Dissection of thoracic aorta, unspecified: Secondary | ICD-10-CM

## 2019-06-22 NOTE — Progress Notes (Signed)
Clinical Summary Mr. Taddei is a 54 y.o.male seen today for follow up of the following medical problems.   1. Nonsustaned VT - admission 04/2019 with CVA and acute dissection. Postop had run of monomorphic VT - 04/2019 LVEF 45-50% - 05/2019 Echo LVEF 50%, mild to mod RV dysfunction - resolved with beta blocker, optimizing electrolytes  - no recent palpitations  2. Acute ascending aortic dissection - incidental findings, patient presented initially with acute stroke symptoms. Ascending dissection that extended into both carotids - s/p repair 05/01/19 with 28 mm Dacron graft - followed by Dr Orvan Seen as outpatient, plans for repeat CTA  3. Postoperative pericarditis - has been on colchciine - would plan for 3 months of treatment, stop early June - no recent symptoms   4. 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 Ascending dissection that extended into both carotids - on follow imaging after repair stable carotid dissections, lumen of CCA improved - followed by neuro Dr Sue Lush - neuro has recommedned ASA 325  5. HTN - compliant with meds  6. Hyperlipidemia - 04/2019 TC 148 TG 112 HDL 38 LDL 88 - compliant with statin.   Past Medical History:  Diagnosis Date  . Hyperlipidemia   . Hypertension   . Stroke [facility])      No Known Allergies   Current Outpatient Medications  Medication Sig Dispense Refill  . acetaminophen (TYLENOL) 325 MG tablet Take 1-2 tablets (325-650 mg total) by mouth every 4 (four) hours as needed for mild pain.    Marland Kitchen amantadine (SYMMETREL) 100 MG capsule Take 2 capsules (200 mg total) by mouth daily. 60 capsule 1  . amLODipine (NORVASC) 10 MG tablet amlodipine 10 mg tablet    . aspirin EC 325 MG EC tablet Take 1 tablet (325 mg total) by mouth daily. 30 tablet 0  . bisacodyl (DULCOLAX) 5 MG EC tablet Take 2 tablets (10 mg total) by mouth daily. 30 tablet 0  . colchicine 0.6 MG  tablet Take 1 tablet (0.6 mg total) by mouth 2 (two) times daily. 60 tablet 0  . folic acid (FOLVITE) 1 MG tablet Take 1 tablet (1 mg total) by mouth daily. 30 tablet 0  . metoprolol tartrate (LOPRESSOR) 25 MG tablet Take 1 tablet (25 mg total) by mouth 2 (two) times daily. 60 tablet 0  . pantoprazole (PROTONIX) 40 MG tablet Take 1 tablet (40 mg total) by mouth daily. 30 tablet 0  . rosuvastatin (CRESTOR) 20 MG tablet Take 1 tablet (20 mg total) by mouth daily at 6 PM. 30 tablet 0  . traMADol (ULTRAM) 50 MG tablet Take 1-2 tablets (50-100 mg total) by mouth every 4 (four) hours as needed for moderate pain. 30 tablet 0  . traZODone (DESYREL) 50 MG tablet Take 1 tablet (50 mg total) by mouth at bedtime. 20 tablet 0   No current facility-administered medications for this visit.     Past Surgical History:  Procedure Laterality Date  . ACHILLES TENDON SURGERY Right 11/10/2016   Procedure: ACHILLES TENDON REPAIR VIA SPEED BRIDGE;  Surgeon: [name], DPM;  Location: [facility];  Service: Podiatry;  Laterality: Right;  . HEEL SPUR RESECTION Right 11/10/2016   Procedure: HEEL SPUR RESECTION (HAGLUNDS DEFORMITY);  Surgeon: [name], DPM;  Location: [facility];  Service: Podiatry;  Laterality: Right;  . REPAIR OF ACUTE ASCENDING THORACIC AORTIC DISSECTION N/A 05/01/2019   Procedure: REPAIR OF ACUTE ASCENDING THORACIC AORTIC DISSECTION USING HEMOSHIELD  GRAFT SIZE ;  Surgeon: Linden Dolin, MD;  Location: [facility];  Service: Cardiothoracic;  Laterality: N/A;     No Known Allergies    Family History  Problem Relation Age of Onset  . Hypertension Mother   . Hypertension Father      Social History Mr. Moyers reports that he quit smoking about 7 weeks ago. His smoking use included cigarettes. He has a 4.50 pack-year smoking history. He has never used smokeless tobacco. Mr. Bara reports current alcohol use of about 1.0 standard drinks of alcohol per week.   Review of  Systems CONSTITUTIONAL: No weight loss, fever, chills, weakness or fatigue.  HEENT: Eyes: No visual loss, blurred vision, double vision or yellow sclerae.No hearing loss, sneezing, congestion, runny nose or sore throat.  SKIN: No rash or itching.  CARDIOVASCULAR: per hpi RESPIRATORY: No shortness of breath, cough or sputum.  GASTROINTESTINAL: No anorexia, nausea, vomiting or diarrhea. No abdominal pain or blood.  GENITOURINARY: No burning on urination, no polyuria NEUROLOGICAL: No headache, dizziness, syncope, paralysis, ataxia, numbness or tingling in the extremities. No change in bowel or bladder control.  MUSCULOSKELETAL: No muscle, back pain, joint pain or stiffness.  LYMPHATICS: No enlarged nodes. No history of splenectomy.  PSYCHIATRIC: No history of depression or anxiety.  ENDOCRINOLOGIC: No reports of sweating, cold or heat intolerance. No polyuria or polydipsia.  Marland Kitchen   Physical Examination Today's Vitals   06/22/19 0854  BP: 110/66  Pulse: 70  Temp: 98.7 F (37.1 C)  SpO2: 96%  Weight: 254 lb (115.2 kg)  Height: 6' (1.829 m)   Body mass index is 34.45 kg/m.  Gen: resting comfortably, no acute distress HEENT: no scleral icterus, pupils equal round and reactive, no palptable cervical adenopathy,  CV: RRR, no m/r/g, no jvd Resp: Clear to auscultation bilaterally GI: abdomen is soft, non-tender, non-distended, normal bowel sounds, no hepatosplenomegaly MSK: extremities are warm, no edema.  Skin: warm, no rash Neuro:  no focal deficits Psych: appropriate affect     Assessment and Plan  1. Monomorphic VT/NSVT - isolated episode of NSVT postop aortic dissection repair - no recurrences during admission - no recent symptoms - continue beta blockers. - echo LVEF 50%  2. Aortic dissection s/p repair - followed closely by CT surgery, has repeat scan coming up - contine beta blocker, bp control  3. CVA - in setting of aortic dissection involving bilateral carotids -  followed by neuro - they have recommended high dose ASA. He is on statin  4. Postoperative pericarditis - no symptoms - plan for total of 3 months colchicine, he will stop 08/01/19  5. HTN - at goal, continue current meds  Antoine Poche, M.D.,

## 2019-06-22 NOTE — Patient Instructions (Signed)
Medication Instructions:  ON June 2ND STOP COLCHICINE     Labwork: NONE  Testing/Procedures: NONE  Follow-Up: Your physician wants you to follow-up in: 6 MONTHS.  You will receive a reminder letter in the mail two months in advance. If you don't receive a letter, please call our office to schedule the follow-up appointment.   Any Other Special Instructions Will Be Listed Below (If Applicable).     If you need a refill on your cardiac medications before your next appointment, please call your pharmacy.

## 2019-06-25 ENCOUNTER — Ambulatory Visit
Admission: RE | Admit: 2019-06-25 | Discharge: 2019-06-25 | Disposition: A | Discharge status: Home / Self Care | Payer: 59 | Source: Ambulatory Visit | Attending: Cardiothoracic Surgery | Admitting: Cardiothoracic Surgery

## 2019-06-25 ENCOUNTER — Ambulatory Visit: Payer: Self-pay | Admitting: Cardiothoracic Surgery

## 2019-06-25 DIAGNOSIS — Z9889 Other specified postprocedural states: Secondary | ICD-10-CM

## 2019-06-25 MED ORDER — IOHEXOL 350 MG/ML SOLN
100.0000 mL | Freq: Once | INTRAVENOUS | Status: AC | PRN
  Administered 2019-06-25: 100 mL via INTRAVENOUS

## 2019-06-27 ENCOUNTER — Ambulatory Visit: Payer: Self-pay | Admitting: Cardiothoracic Surgery

## 2019-06-27 ENCOUNTER — Other Ambulatory Visit: Payer: Self-pay | Admitting: *Deleted

## 2019-06-27 NOTE — Telephone Encounter (Signed)
Patients caretaker contacted our office stating that they need a refill on amantidine.  Last clinic note states that Dr. Berline Chough would prescribe for a total of 3 months, then re-evaluate.  This appears to be the 3rd month Rx that they need filled.  Forward to Dr. Berline Chough to make certain

## 2019-06-28 MED ORDER — AMANTADINE HCL 100 MG PO CAPS: 200.0000 mg | ORAL_CAPSULE | Freq: Every day | ORAL | 0 refills | Status: DC

## 2019-07-02 ENCOUNTER — Encounter (HOSPITAL_COMMUNITY): Payer: Self-pay | Admitting: Speech Pathology

## 2019-07-02 ENCOUNTER — Ambulatory Visit (HOSPITAL_COMMUNITY): Payer: 59 | Attending: Physician Assistant | Admitting: Speech Pathology

## 2019-07-02 ENCOUNTER — Other Ambulatory Visit: Payer: Self-pay

## 2019-07-02 DIAGNOSIS — R471 Dysarthria and anarthria: Secondary | ICD-10-CM | POA: Diagnosis present

## 2019-07-02 DIAGNOSIS — R41841 Cognitive communication deficit: Secondary | ICD-10-CM | POA: Insufficient documentation

## 2019-07-02 DIAGNOSIS — I69890 Apraxia following other cerebrovascular disease: Secondary | ICD-10-CM | POA: Insufficient documentation

## 2019-07-02 NOTE — Therapy (Signed)
[facility] [address] [place], [place], [address] Phone: [phone]   Fax:  [phone]  Speech Language Pathology Treatment  Patient Details  Name: Erik Parker MRN: [id number] Date of Birth: [birth year] Referring Provider (SLP): Mariam Dollar, PA-C   Encounter Date: 07/02/2019  End of Session - 07/02/19 1057    Visit Number  10    Number of Visits  17    Date for SLP Re-Evaluation  08/09/19    Authorization Type  [employer] NAP    SLP Start Time  1033    SLP Stop Time   1115    SLP Time Calculation (min)  42 min    Activity Tolerance  Patient tolerated treatment well       Past Medical History:  Diagnosis Date  . Hyperlipidemia   . Hypertension   . Stroke [facility])     Past Surgical History:  Procedure Laterality Date  . ACHILLES TENDON SURGERY Right 11/10/2016   Procedure: ACHILLES TENDON REPAIR VIA SPEED BRIDGE;  Surgeon: [name], DPM;  Location: [facility];  Service: Podiatry;  Laterality: Right;  . HEEL SPUR RESECTION Right 11/10/2016   Procedure: HEEL SPUR RESECTION (HAGLUNDS DEFORMITY);  Surgeon: [name], DPM;  Location: [facility];  Service: Podiatry;  Laterality: Right;  . REPAIR OF ACUTE ASCENDING THORACIC AORTIC DISSECTION N/A 05/01/2019   Procedure: REPAIR OF ACUTE ASCENDING THORACIC AORTIC DISSECTION USING HEMOSHIELD GRAFT SIZE ;  Surgeon: Linden Dolin, MD;  Location: [facility] OR;  Service: Cardiothoracic;  Laterality: N/A;    There were no vitals filed for this visit.  Subjective Assessment - 07/02/19 1044    Subjective  "They said I could drive if someone is with me."    Currently in Pain?  No/denies       ADULT SLP TREATMENT - 07/02/19 0001      General Information   Behavior/Cognition  Alert;Cooperative;Pleasant mood    Patient Positioning  Upright in chair    Oral care provided  N/A    HPI  Erik Leonetti. Parker is a 54 year old right-handed male history of hypertension, hyperlipidemia as well as  tobacco abuse. Patient lives with spouse.  Independent prior to admission working full-time.  He presented on 05/01/2019 with right facial droop, chest pain, and right arm weakness. CT of the head showed left frontal operculum acute nonhemorrhagic infarction.  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 Vickey Sages.  Intraoperative echocardiogram with ejection fraction of 50%. Follow-up MRI of the brain showed scattered areas of acute infarction in both hemispheres, more extensive on the left than right, consistent with embolic infarctions.  The largest area of involvement 3.5-4 cm stroke in the left frontal operculum with mild swelling and minimal petechial blood products.  Scattered other strokes in the left hemisphere, next largest measuring 2 cm at the left parieto-occipital junction.  Single acute infarction in the right hemisphere measuring less than a centimeter in size in the right frontal lobe.  Acute postoperative pericarditis maintained on colchicine. Pt attended CIR 05/09/19-05/15/19. He was referred for OP SLP therapy evaluation and treatment by Mariam Dollar, PA-C.       Treatment Provided   Treatment provided  Cognitive-Linquistic      Pain Assessment   Pain Assessment  No/denies pain      Cognitive-Linquistic Treatment   Treatment focused on  Aphasia;Apraxia;Dysarthria;Patient/family/caregiver  education    Skilled Treatment  SLP provided skilled SLP treatment targeting apraxia and dysarthria goals during elicitation or /r/ words and multisyllabic words used in Pt generated sentences. He produced /r/ words with 92% acc and multisyllabic words in sentences with 95% acc. Divergent naming tasks were completed with 100% acc.       Assessment / Recommendations / Plan   Plan  Continue with current plan of care         SLP Short Term Goals -  07/02/19 1101      SLP SHORT TERM GOAL #1   Title  Pt will implement speech intelligibility strategies at the sentence level (decrease rate, self correct errors, self monitoring, checking to see if listener understands) for 90% intelligibility with min assist.    Baseline  75%    Time  4    Period  Weeks    Status  Achieved    Target Date  06/28/19      SLP SHORT TERM GOAL #2   Title  Pt will complete high level verbal expression tasks (object description, state function, provide short summary, etc) using short sentences with 90% acc and min assist.    Baseline  75% acc    Time  4    Period  Weeks    Status  Achieved    Target Date  06/28/19      SLP SHORT TERM GOAL #3   Title  Pt will complete short paragraph reading comprehension tasks with 80% acc and min assist via multiple choice response.    Baseline  75% sentence level with multiple choice responses    Time  4    Period  Weeks    Status  Achieved    Target Date  06/28/19      SLP SHORT TERM GOAL #4   Title  Pt will verbalize 7+ word sentence using subject, verb, object format when describing pictures with 90% acc with min cues.    Baseline  Pt primarily lists objects during picture description tasks    Time  4    Period  Weeks    Status  Achieved    Target Date  06/28/19      SLP SHORT TERM GOAL #5   Title  Pt will complete apraxia drills (nonsense words, multisyllabic words, progressive sentences) with 80% acc with mi/mod assist and use of strategies.    Baseline  70% nonsense words    Time  4    Period  Weeks    Status  Achieved    Target Date  06/28/19      SLP SHORT TERM GOAL #6   Title  Pt will implement speech intelligibility strategies at the conversation level (decrease rate, self correct errors, self monitoring, checking to see if listener understands) for 90% intelligibility with allowance for self correction after SLP queries.    Baseline  75% conversation    Time  4    Period  Weeks    Status   On-going    Target Date  07/31/19      SLP SHORT TERM GOAL #7   Title  Pt will complete apraxia drills (nonsense words, multisyllabic words, progressive sentences) with 90% acc with min assist and use of strategies.    Baseline  90% mod assist    Time  4    Period  Weeks    Status  On-going    Target Date  07/31/19      SLP SHORT  TERM GOAL #8   Title  Pt will produce /r/ in all positions of single word productions with 90% accuracy during oral reading or repetition of single words.    Baseline  75% with approximations    Time  4    Period  Weeks    Status  On-going    Target Date  07/31/19       SLP Long Term Goals - 07/02/19 1106      SLP LONG TERM GOAL #1   Title  Pt will communicate complex thoughts and feelings to [facility] with use of strategies.    Baseline  moderate impairment    Time  8    Period  Weeks    Status  On-going       Plan - 07/02/19 1100    Clinical Impression Statement  Pt continues to make progress and be highly motivated to improving speech/language skills. 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 He completes HEP as assigned daily. He benefits from verbal and visual cues to assist with lingual placement for /r/ and blends. Continue targeting expressive speech going forward.    Speech Therapy Frequency  2x / week    Duration  4 weeks    Treatment/Interventions  Compensatory strategies;Cueing hierarchy;Patient/family education;Functional tasks;SLP instruction and feedback;Compensatory techniques;Internal/external aids    Potential to Achieve Goals  Good    Potential Considerations  Severity of impairments    SLP Home Exercise Plan  Pt will complete HEP as assigned to facilitate carryover of treatment strategies and techniques in home environment with assist as needed.    Consulted and Agree with Plan of Care  Patient;Family member/caregiver    Family Member Consulted  Wife,  [name]       Patient will benefit from skilled therapeutic intervention in order to improve the following deficits and impairments:   Cognitive communication deficit  Dysarthria and anarthria  Apraxia following other cerebrovascular disease    Problem List Patient Active Problem List   Diagnosis Date Noted  . Monomorphic ventricular tachycardia (HCC) 05/22/2019  . Aphasia as late effect of cerebrovascular accident (CVA) 05/10/2019  . Dysphagia 05/10/2019  . History of open heart surgery   . Pericarditis   . Acute CVA (cerebrovascular accident) (HCC)   . Essential hypertension   . Dyslipidemia   . Tobacco abuse   . Tachypnea   . Leukocytosis   . Cerebral embolism with cerebral infarction 05/03/2019  . Aortic dissection (HCC) 05/01/2019  . 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 Bilateral hand numbness 02/26/2019  . Decreased grip strength 02/26/2019   Thank you,  Havery Moros, CCC-SLP [phone]   [facility] 07/02/2019, 11:13 AM  [facility] [address] [place], [place], [address] Phone: [phone]   Fax:  [phone]   Name: Erik Parker MRN: [id number] Date of Birth: 1965/03/27

## 2019-07-04 ENCOUNTER — Encounter (HOSPITAL_COMMUNITY): Payer: Self-pay | Admitting: Speech Pathology

## 2019-07-04 ENCOUNTER — Ambulatory Visit (HOSPITAL_COMMUNITY): Payer: 59 | Admitting: Speech Pathology

## 2019-07-04 ENCOUNTER — Other Ambulatory Visit: Payer: Self-pay

## 2019-07-04 DIAGNOSIS — I69890 Apraxia following other cerebrovascular disease: Secondary | ICD-10-CM

## 2019-07-04 DIAGNOSIS — R41841 Cognitive communication deficit: Secondary | ICD-10-CM | POA: Diagnosis not present

## 2019-07-04 DIAGNOSIS — R471 Dysarthria and anarthria: Secondary | ICD-10-CM

## 2019-07-04 NOTE — Therapy (Signed)
[place], [place], [address] Phone: [phone]   Fax:  [phone]  Speech Language Pathology Treatment  Patient Details  Name: Erik Parker MRN: [id number] Date of Birth: September 04, 1965 Referring Provider (SLP): Lauraine Rinne, PA-C   Encounter Date: 07/04/2019  End of Session - 07/04/19 0913    Visit Number  11    Number of Visits  17    Date for SLP Re-Evaluation  08/09/19    Authorization Type  [employer] NAP    SLP Start Time  0906    SLP Stop Time   0949    SLP Time Calculation (min)  43 min    Activity Tolerance  Patient tolerated treatment well       Past Medical History:  Diagnosis Date  . Hyperlipidemia   . Hypertension   . Stroke [facility])     Past Surgical History:  Procedure Laterality Date  . ACHILLES TENDON SURGERY Right 11/10/2016   Procedure: ACHILLES TENDON REPAIR VIA SPEED BRIDGE;  Surgeon: [name], DPM;  Location: [facility];  Service: Podiatry;  Laterality: Right;  . HEEL SPUR RESECTION Right 11/10/2016   Procedure: HEEL SPUR RESECTION (HAGLUNDS DEFORMITY);  Surgeon: [name], DPM;  Location: [facility];  Service: Podiatry;  Laterality: Right;  . REPAIR OF ACUTE ASCENDING THORACIC AORTIC DISSECTION N/A 05/01/2019   Procedure: REPAIR OF ACUTE ASCENDING THORACIC AORTIC DISSECTION USING HEMOSHIELD GRAFT SIZE 28MM;  Surgeon: Wonda Olds, MD;  Location: [facility] OR;  Service: Cardiothoracic;  Laterality: N/A;    There were no vitals filed for this visit.  Subjective Assessment - 07/04/19 0911    Subjective  "How are you?"    Currently in Pain?  No/denies       ADULT SLP TREATMENT - 07/04/19 0912      General Information   Behavior/Cognition  Alert;Cooperative;Pleasant mood    Patient Positioning  Upright in chair    Oral care provided  N/A    HPI  Erik Parker. [name] is a 54 year old right-handed male history of hypertension, hyperlipidemia as well as tobacco abuse. Patient lives with  spouse.  Independent prior to admission working full-time.  He presented on 05/01/2019 with right facial droop, chest pain, and right arm weakness. CT of the head showed left frontal operculum acute nonhemorrhagic infarction.  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 Orvan Seen.  Intraoperative echocardiogram with ejection fraction of 50%. Follow-up MRI of the brain showed scattered areas of acute infarction in both hemispheres, more extensive on the left than right, consistent with embolic infarctions.  The largest area of involvement 3.5-4 cm stroke in the left frontal operculum with mild swelling and minimal petechial blood products.  Scattered other strokes in the left hemisphere, next largest measuring 2 cm at the left parieto-occipital junction.  Single acute infarction in the right hemisphere measuring less than a centimeter in size in the right frontal lobe.  Acute postoperative pericarditis maintained on colchicine. Pt attended CIR 05/09/19-05/15/19. He was referred for OP SLP therapy evaluation and treatment by Lauraine Rinne, PA-C.       Treatment Provided   Treatment provided  Cognitive-Linquistic      Pain Assessment   Pain Assessment  No/denies pain      Cognitive-Linquistic Treatment   Treatment focused on  Aphasia;Apraxia;Dysarthria;Patient/family/caregiver education    Skilled Treatment  SLP provided skilled SLP treatment targeting apraxia and dysarthria goals during elicitation increasing length and complexity of labiodental/interdental word progressing to short sentences. Pt was cued to silently rehearse each word prior to verbalizing aloud and decrease rate. Pt was recorded and he identified the need for him to decrease rate.       Assessment / Recommendations / Plan   Plan  Continue with current plan of care      Progression  Toward Goals   Progression toward goals  Progressing toward goals         SLP Short Term Goals - 07/04/19 0914      SLP SHORT TERM GOAL #1   Title  Pt will implement speech intelligibility strategies at the sentence level (decrease rate, self correct errors, self monitoring, checking to see if listener understands) for 90% intelligibility with min assist.    Baseline  75%    Time  4    Period  Weeks    Status  Achieved    Target Date  06/28/19      SLP SHORT TERM GOAL #2   Title  Pt will complete high level verbal expression tasks (object description, state function, provide short summary, etc) using short sentences with 90% acc and min assist.    Baseline  75% acc    Time  4    Period  Weeks    Status  Achieved    Target Date  06/28/19      SLP SHORT TERM GOAL #3   Title  Pt will complete short paragraph reading comprehension tasks with 80% acc and min assist via multiple choice response.    Baseline  75% sentence level with multiple choice responses    Time  4    Period  Weeks    Status  Achieved    Target Date  06/28/19      SLP SHORT TERM GOAL #4   Title  Pt will verbalize 7+ word sentence using subject, verb, object format when describing pictures with 90% acc with min cues.    Baseline  Pt primarily lists objects during picture description tasks    Time  4    Period  Weeks    Status  Achieved    Target Date  06/28/19      SLP SHORT TERM GOAL #5   Title  Pt will complete apraxia drills (nonsense words, multisyllabic words, progressive sentences) with 80% acc with mi/mod assist and use of strategies.    Baseline  70% nonsense words    Time  4    Period  Weeks    Status  Achieved    Target Date  06/28/19      SLP SHORT TERM GOAL #6   Title  Pt will implement speech intelligibility strategies at the conversation level (decrease rate, self correct errors, self monitoring, checking to see if listener understands) for 90% intelligibility with allowance for self  correction after SLP queries.    Baseline  75% conversation    Time  4    Period  Weeks    Status  On-going    Target Date  07/31/19      SLP SHORT TERM GOAL #7   Title  Pt will complete apraxia drills (nonsense words, multisyllabic words, progressive sentences) with 90% acc with min assist and use of strategies.    Baseline  90% mod assist    Time  4    Period  Weeks    Status  On-going    Target Date  07/31/19      SLP SHORT TERM GOAL #8   Title  Pt will produce /r/ in all positions of single word productions with 90% accuracy during oral reading or repetition of single words.    Baseline  75% with approximations    Time  4    Period  Weeks    Status  On-going    Target Date  07/31/19       SLP Long Term Goals - 07/04/19 0917      SLP LONG TERM GOAL #1   Title  Pt will communicate complex thoughts and feelings to [facility] with use of strategies.    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 He completes HEP as assigned daily. He struggles the most with the following sound: l, r, s-blends, and dg. Continue targeting expressive speech going forward.    Speech Therapy Frequency  2x / week    Duration  4 weeks    Treatment/Interventions  Compensatory strategies;Cueing hierarchy;Patient/family education;Functional tasks;SLP instruction and feedback;Compensatory techniques;Internal/external aids    Potential to Achieve Goals  Good    Potential Considerations  Severity of impairments    SLP Home Exercise Plan  Pt will complete HEP as assigned to facilitate carryover of treatment strategies and techniques in home environment with assist as needed.    Consulted and Agree with Plan  of Care  Patient;Family member/caregiver    Family Member Consulted  Wife, [name]       Patient will benefit from skilled therapeutic intervention in order to improve the following deficits and impairments:   Dysarthria and anarthria  Apraxia following other cerebrovascular disease  Cognitive communication deficit    Problem List Patient Active Problem List   Diagnosis Date Noted  . Monomorphic ventricular tachycardia (HCC) 05/22/2019  . Aphasia as late effect of cerebrovascular accident (CVA) 05/10/2019  . Dysphagia 05/10/2019  . History of open heart surgery   . Pericarditis   . Acute CVA (cerebrovascular accident) (HCC)   . Essential hypertension   . Dyslipidemia   . Tobacco abuse   . Tachypnea   . Leukocytosis   . Cerebral embolism with cerebral infarction 05/03/2019  . Aortic dissection (HCC) 05/01/2019  . 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 Bilateral hand numbness 02/26/2019  . Decreased grip strength 02/26/2019   Thank you,  Havery Moros, CCC-SLP [phone]  [facility] 07/04/2019, 9:25 AM  [facility] [address] [place], [place], [address] Phone: [phone]   Fax:  [phone]   Name: TYLEK BONEY MRN: [id number] Date of Birth: 03-Jun-1965

## 2019-07-09 ENCOUNTER — Other Ambulatory Visit: Payer: Self-pay

## 2019-07-09 ENCOUNTER — Encounter (HOSPITAL_COMMUNITY): Payer: Self-pay | Admitting: Speech Pathology

## 2019-07-09 ENCOUNTER — Ambulatory Visit (HOSPITAL_COMMUNITY): Payer: 59 | Admitting: Speech Pathology

## 2019-07-09 DIAGNOSIS — R471 Dysarthria and anarthria: Secondary | ICD-10-CM

## 2019-07-09 DIAGNOSIS — I69890 Apraxia following other cerebrovascular disease: Secondary | ICD-10-CM

## 2019-07-09 DIAGNOSIS — R41841 Cognitive communication deficit: Secondary | ICD-10-CM

## 2019-07-09 NOTE — Therapy (Signed)
[facility] [address] [place], [place], [address] Phone: [phone]   Fax:  [phone]  Speech Language Pathology Treatment  Patient Details  Name: Erik Parker MRN: [id number] Date of Birth: 02/08/1966 Referring Provider (SLP): Mariam Dollar, PA-C   Encounter Date: 07/09/2019  End of Session - 07/09/19 1019    Visit Number  12    Number of Visits  17    Date for SLP Re-Evaluation  08/09/19    Authorization Type  [employer] NAP    SLP Start Time  0950    SLP Stop Time   1030    SLP Time Calculation (min)  40 min    Activity Tolerance  Patient tolerated treatment well       Past Medical History:  Diagnosis Date  . Hyperlipidemia   . Hypertension   . Stroke [facility])     Past Surgical History:  Procedure Laterality Date  . ACHILLES TENDON SURGERY Right 11/10/2016   Procedure: ACHILLES TENDON REPAIR VIA SPEED BRIDGE;  Surgeon: [name], DPM;  Location: [facility];  Service: Podiatry;  Laterality: Right;  . HEEL SPUR RESECTION Right 11/10/2016   Procedure: HEEL SPUR RESECTION (HAGLUNDS DEFORMITY);  Surgeon: [name], DPM;  Location: [facility];  Service: Podiatry;  Laterality: Right;  . REPAIR OF ACUTE ASCENDING THORACIC AORTIC DISSECTION N/A 05/01/2019   Procedure: REPAIR OF ACUTE ASCENDING THORACIC AORTIC DISSECTION USING HEMOSHIELD GRAFT SIZE ;  Surgeon: Linden Dolin, MD;  Location[facility] OR;  Service: Cardiothoracic;  Laterality: N/A;    There were no vitals filed for this visit.  Subjective Assessment - 07/09/19 1014    Subjective  "I recorded myself on my wife's phone."    Currently in Pain?  No/denies       ADULT SLP TREATMENT - 07/09/19 0001      General Information   Behavior/Cognition  Alert;Cooperative;Pleasant mood    Patient Positioning  Upright in chair    Oral care provided  N/A    HPI  Erik Parker is a 54 year old right-handed male history of hypertension, hyperlipidemia as well as tobacco  abuse. Patient lives with spouse.  Independent prior to admission working full-time.  He presented on 05/01/2019 with right facial droop, chest pain, and right arm weakness. CT of the head showed left frontal operculum acute nonhemorrhagic infarction.  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 Vickey Sages.  Intraoperative echocardiogram with ejection fraction of 50%. Follow-up MRI of the brain showed scattered areas of acute infarction in both hemispheres, more extensive on the left than right, consistent with embolic infarctions.  The largest area of involvement 3.5-4 cm stroke in the left frontal operculum with mild swelling and minimal petechial blood products.  Scattered other strokes in the left hemisphere, next largest measuring 2 cm at the left parieto-occipital junction.  Single acute infarction in the right hemisphere measuring less than a centimeter in size in the right frontal lobe.  Acute postoperative pericarditis maintained on colchicine. Pt attended CIR 05/09/19-05/15/19. He was referred for OP SLP therapy evaluation and treatment by Mariam Dollar, PA-C.       Treatment Provided   Treatment provided  Cognitive-Linquistic      Pain Assessment   Pain Assessment  No/denies pain      Cognitive-Linquistic Treatment   Treatment focused on  Aphasia;Apraxia;Dysarthria;Patient/family/caregiver education  Skilled Treatment SLP provided skilled SLP treatment targeting apraxia and dysarthria goals during elicitation increasing length and complexity of labiodental/interdental word progressing to short sentences. Pt was cued to silently rehearse each word prior to verbalizing aloud and decrease rate. He indicated that he tried this at home when he noticed "troublesome words". He also recorded himself on his wife's phone.      Assessment /  Recommendations / Plan   Plan  Continue with current plan of care      Progression Toward Goals   Progression toward goals  Progressing toward goals         SLP Short Term Goals - 07/09/19 1021      SLP SHORT TERM GOAL #1   Title  Pt will implement speech intelligibility strategies at the sentence level (decrease rate, self correct errors, self monitoring, checking to see if listener understands) for 90% intelligibility with min assist.    Baseline  75%    Time  4    Period  Weeks    Status  Achieved    Target Date  06/28/19      SLP SHORT TERM GOAL #2   Title  Pt will complete high level verbal expression tasks (object description, state function, provide short summary, etc) using short sentences with 90% acc and min assist.    Baseline  75% acc    Time  4    Period  Weeks    Status  Achieved    Target Date  06/28/19      SLP SHORT TERM GOAL #3   Title  Pt will complete short paragraph reading comprehension tasks with 80% acc and min assist via multiple choice response.    Baseline  75% sentence level with multiple choice responses    Time  4    Period  Weeks    Status  Achieved    Target Date  06/28/19      SLP SHORT TERM GOAL #4   Title  Pt will verbalize 7+ word sentence using subject, verb, object format when describing pictures with 90% acc with min cues.    Baseline  Pt primarily lists objects during picture description tasks    Time  4    Period  Weeks    Status  Achieved    Target Date  06/28/19      SLP SHORT TERM GOAL #5   Title  Pt will complete apraxia drills (nonsense words, multisyllabic words, progressive sentences) with 80% acc with mi/mod assist and use of strategies.    Baseline  70% nonsense words    Time  4    Period  Weeks    Status  Achieved    Target Date  06/28/19      SLP SHORT TERM GOAL #6   Title  Pt will implement speech intelligibility strategies at the conversation level (decrease rate, self correct errors, self monitoring, checking  to see if listener understands) for 90% intelligibility with allowance for self correction after SLP queries.    Baseline  75% conversation    Time  4    Period  Weeks    Status  On-going    Target Date  07/31/19      SLP SHORT TERM GOAL #7   Title  Pt will complete apraxia drills (nonsense words, multisyllabic words, progressive sentences) with 90% acc with min assist and use of strategies.    Baseline  90% mod assist    Time  4  Period  Weeks    Status  On-going    Target Date  07/31/19      SLP SHORT TERM GOAL #8   Title  Pt will produce /r/ in all positions of single word productions with 90% accuracy during oral reading or repetition of single words.    Baseline  75% with approximations    Time  4    Period  Weeks    Status  On-going    Target Date  07/31/19       SLP Long Term Goals - 07/09/19 1040      SLP LONG TERM GOAL #1   Title  Pt will communicate complex thoughts and feelings to [facility] with use of strategies.    Baseline  moderate impairment    Time  8    Period  Weeks    Status  On-going       Plan - 07/09/19 1021    Clinical Impression Statement  Pt continues to make progress and be highly motivated to improving speech/language skills. We will continue to target s-blends, r-blends, and dg. SLP has also started a list of multisyllabic words for him to practice at home. He benefits from reminders to decrease rate of speech to increase speech intelligibility. Continue targeting expressive speech going forward.    Speech Therapy Frequency  2x / week    Duration  4 weeks    Treatment/Interventions  Compensatory strategies;Cueing hierarchy;Patient/family education;Functional tasks;SLP instruction and feedback;Compensatory techniques;Internal/external aids    Potential to Achieve Goals  Good    Potential Considerations  Severity of impairments    SLP Home Exercise Plan  Pt will complete HEP as assigned to facilitate carryover of treatment strategies and techniques in  home environment with assist as needed.    Consulted and Agree with Plan of Care  Patient;Family member/caregiver    Family Member Consulted  Wife, [name]       Patient will benefit from skilled therapeutic intervention in order to improve the following deficits and impairments:   Dysarthria and anarthria  Apraxia following other cerebrovascular disease  Cognitive communication deficit    Problem List Patient Active Problem List   Diagnosis Date Noted  . Monomorphic ventricular tachycardia ([place]) 05/22/2019  . Aphasia as late effect of cerebrovascular accident (CVA) 05/10/2019  . Dysphagia 05/10/2019  . History of open heart surgery   . Pericarditis   . Acute CVA (cerebrovascular accident) ([place])   . Essential hypertension   . Dyslipidemia   . Tobacco abuse   . Tachypnea   . Leukocytosis   . Cerebral embolism with cerebral infarction 05/03/2019  . Aortic dissection ([place]) 05/01/2019  . 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 Bilateral hand numbness 02/26/2019  . Decreased grip strength 02/26/2019   Thank you,  Genene Churn, CCC-SLP [phone]  [facility] 07/09/2019, 10:49 AM  [place] [address] [place], [place], [address] Phone: [phone]   Fax:  [phone]   Name: Erik Parker MRN: [id number] Date of Birth: 11-03-1965

## 2019-07-11 ENCOUNTER — Encounter (HOSPITAL_COMMUNITY): Payer: Self-pay | Admitting: Speech Pathology

## 2019-07-11 ENCOUNTER — Other Ambulatory Visit: Payer: Self-pay

## 2019-07-11 ENCOUNTER — Ambulatory Visit (HOSPITAL_COMMUNITY): Payer: 59 | Admitting: Speech Pathology

## 2019-07-11 DIAGNOSIS — R41841 Cognitive communication deficit: Secondary | ICD-10-CM | POA: Diagnosis not present

## 2019-07-11 DIAGNOSIS — I69890 Apraxia following other cerebrovascular disease: Secondary | ICD-10-CM

## 2019-07-11 DIAGNOSIS — R471 Dysarthria and anarthria: Secondary | ICD-10-CM

## 2019-07-11 NOTE — Therapy (Signed)
[facility] [address] [place], [place], [address] Phone: [phone]   Fax:  [phone]  Speech Language Pathology Treatment  Patient Details  Name: Erik Parker MRN: [id number] Date of Birth: 13-Mar-1965 Referring Provider (SLP): Mariam Dollar, PA-C   Encounter Date: 07/11/2019  End of Session - 07/11/19 0911    Visit Number  13    Number of Visits  17    Date for SLP Re-Evaluation  08/09/19    Authorization Type  [employer] NAP    SLP Start Time  0900    SLP Stop Time   0945    SLP Time Calculation (min)  45 min    Activity Tolerance  Patient tolerated treatment well       Past Medical History:  Diagnosis Date  . Hyperlipidemia   . Hypertension   . Stroke [facility])     Past Surgical History:  Procedure Laterality Date  . ACHILLES TENDON SURGERY Right 11/10/2016   Procedure: ACHILLES TENDON REPAIR VIA SPEED BRIDGE;  Surgeon: [name], DPM;  Location: [facility];  Service: Podiatry;  Laterality: Right;  . HEEL SPUR RESECTION Right 11/10/2016   Procedure: HEEL SPUR RESECTION (HAGLUNDS DEFORMITY);  Surgeon: [name], DPM;  Location: [facility];  Service: Podiatry;  Laterality: Right;  . REPAIR OF ACUTE ASCENDING THORACIC AORTIC DISSECTION N/A 05/01/2019   Procedure: REPAIR OF ACUTE ASCENDING THORACIC AORTIC DISSECTION USING HEMOSHIELD GRAFT SIZE ;  Surgeon: Linden Dolin, MD;  Location: [facility] OR;  Service: Cardiothoracic;  Laterality: N/A;    There were no vitals filed for this visit.  Subjective Assessment - 07/11/19 0907    Subjective  "Pretty good."    Currently in Pain?  No/denies        ADULT SLP TREATMENT - 07/11/19 0908      General Information   Behavior/Cognition  Alert;Cooperative;Pleasant mood    Patient Positioning  Upright in chair    Oral care provided  N/A    HPI  Erik Dorris. Parker is a 54 year old right-handed male history of hypertension, hyperlipidemia as well as tobacco abuse. Patient lives  with spouse.  Independent prior to admission working full-time.  He presented on 05/01/2019 with right facial droop, chest pain, and right arm weakness. CT of the head showed left frontal operculum acute nonhemorrhagic infarction.  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 Vickey Sages.  Intraoperative echocardiogram with ejection fraction of 50%. Follow-up MRI of the brain showed scattered areas of acute infarction in both hemispheres, more extensive on the left than right, consistent with embolic infarctions.  The largest area of involvement 3.5-4 cm stroke in the left frontal operculum with mild swelling and minimal petechial blood products.  Scattered other strokes in the left hemisphere, next largest measuring 2 cm at the left parieto-occipital junction.  Single acute infarction in the right hemisphere measuring less than a centimeter in size in the right frontal lobe.  Acute postoperative pericarditis maintained on colchicine. Pt attended CIR 05/09/19-05/15/19. He was referred for OP SLP therapy evaluation and treatment by Mariam Dollar, PA-C.       Treatment Provided   Treatment provided  Cognitive-Linquistic      Pain Assessment   Pain Assessment  No/denies pain      Cognitive-Linquistic Treatment   Treatment focused on  Aphasia;Apraxia;Dysarthria;Patient/family/caregiver education    Skilled Treatment  SLP provided skilled SLP treatment targeting apraxia, dysarthria, and word finding goals during version of Taboo game (Pt described a specific word but could not use highly associated words to describe it). Pt required cues to verbalize synonyms, think outside the box, and provide listener with feedback/reinforcement to gauge if moving in the right direction. Pt benefited from having extra time to prepare before attempting to verbalize response. Pt  orally read short paragraph with ~80% intelligibility.       Assessment / Recommendations / Plan   Plan  Continue with current plan of care      Progression Toward Goals   Progression toward goals  Progressing toward goals         SLP Short Term Goals - 07/11/19 0917      SLP SHORT TERM GOAL #1   Title  Pt will implement speech intelligibility strategies at the sentence level (decrease rate, self correct errors, self monitoring, checking to see if listener understands) for 90% intelligibility with min assist.    Baseline  75%    Time  4    Period  Weeks    Status  Achieved    Target Date  06/28/19      SLP SHORT TERM GOAL #2   Title  Pt will complete high level verbal expression tasks (object description, state function, provide short summary, etc) using short sentences with 90% acc and min assist.    Baseline  75% acc    Time  4    Period  Weeks    Status  Achieved    Target Date  06/28/19      SLP SHORT TERM GOAL #3   Title  Pt will complete short paragraph reading comprehension tasks with 80% acc and min assist via multiple choice response.    Baseline  75% sentence level with multiple choice responses    Time  4    Period  Weeks    Status  Achieved    Target Date  06/28/19      SLP SHORT TERM GOAL #4   Title  Pt will verbalize 7+ word sentence using subject, verb, object format when describing pictures with 90% acc with min cues.    Baseline  Pt primarily lists objects during picture description tasks    Time  4    Period  Weeks    Status  Achieved    Target Date  06/28/19      SLP SHORT TERM GOAL #5   Title  Pt will complete apraxia drills (nonsense words, multisyllabic words, progressive sentences) with 80% acc with mi/mod assist and use of strategies.    Baseline  70% nonsense words    Time  4    Period  Weeks    Status  Achieved    Target Date  06/28/19      SLP SHORT TERM GOAL #6   Title  Pt will implement speech intelligibility strategies at the  conversation level (decrease rate, self correct errors, self monitoring, checking to see if listener understands) for 90% intelligibility with allowance for self correction after SLP queries.    Baseline  75% conversation    Time  4    Period  Weeks    Status  On-going    Target Date  07/31/19      SLP SHORT TERM GOAL #7   Title  Pt will complete apraxia drills (nonsense words, multisyllabic words, progressive sentences) with 90% acc with min assist and use of strategies.  Baseline  90% mod assist    Time  4    Period  Weeks    Status  On-going    Target Date  07/31/19      SLP SHORT TERM GOAL #8   Title  Pt will produce /r/ in all positions of single word productions with 90% accuracy during oral reading or repetition of single words.    Baseline  75% with approximations    Time  4    Period  Weeks    Status  On-going    Target Date  07/31/19       SLP Long Term Goals - 07/11/19 0919      SLP LONG TERM GOAL #1   Title  Pt will communicate complex thoughts and feelings to [facility] with use of strategies.    Baseline  moderate impairment    Time  8    Period  Weeks    Status  On-going       Plan - 07/11/19 0916    Clinical Impression Statement  Pt continues to make progress and is highly motivated to improving speech/language skills. He needs reminders to focus on accuracy over speed (slow down and over articulate). He was given a Reader's Digest to practice reading short paragraphs aloud at home. Continue targeting expressive speech going forward.    Speech Therapy Frequency  2x / week    Duration  4 weeks    Treatment/Interventions  Compensatory strategies;Cueing hierarchy;Patient/family education;Functional tasks;SLP instruction and feedback;Compensatory techniques;Internal/external aids    Potential to Achieve Goals  Good    Potential Considerations  Severity of impairments    SLP Home Exercise Plan  Pt will complete HEP as assigned to facilitate carryover of treatment  strategies and techniques in home environment with assist as needed.    Consulted and Agree with Plan of Care  Patient;Family member/caregiver    Family Member Consulted  Wife, [name]       Patient will benefit from skilled therapeutic intervention in order to improve the following deficits and impairments:   Dysarthria and anarthria  Apraxia following other cerebrovascular disease  Cognitive communication deficit    Problem List Patient Active Problem List   Diagnosis Date Noted  . Monomorphic ventricular tachycardia ([place]) 05/22/2019  . Aphasia as late effect of cerebrovascular accident (CVA) 05/10/2019  . Dysphagia 05/10/2019  . History of open heart surgery   . Pericarditis   . Acute CVA (cerebrovascular accident) ([place])   . Essential hypertension   . Dyslipidemia   . Tobacco abuse   . Tachypnea   . Leukocytosis   . Cerebral embolism with cerebral infarction 05/03/2019  . Aortic dissection ([place]) 05/01/2019  . 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 Bilateral hand numbness 02/26/2019  . Decreased grip strength 02/26/2019   Thank you,  Genene Churn, CCC-SLP [phone]  [facility] 07/11/2019, 9:21 AM  [place] [address] [place], [place], [address] Phone: [phone]   Fax:  [phone]   Name: LAUREN AGUAYO MRN: [id number] Date of Birth: 1965/05/08

## 2019-07-16 ENCOUNTER — Ambulatory Visit (INDEPENDENT_AMBULATORY_CARE_PROVIDER_SITE_OTHER): Payer: Self-pay | Admitting: Cardiothoracic Surgery

## 2019-07-16 ENCOUNTER — Other Ambulatory Visit: Payer: Self-pay

## 2019-07-16 VITALS — BP 134/83 | HR 60 | Temp 98.4°F | Resp 20 | Ht 72.0 in | Wt 247.0 lb

## 2019-07-16 DIAGNOSIS — Z9889 Other specified postprocedural states: Secondary | ICD-10-CM

## 2019-07-16 DIAGNOSIS — I71019 Dissection of thoracic aorta, unspecified: Secondary | ICD-10-CM

## 2019-07-16 DIAGNOSIS — I7101 Dissection of thoracic aorta: Secondary | ICD-10-CM

## 2019-07-17 ENCOUNTER — Other Ambulatory Visit: Payer: Self-pay | Admitting: Cardiology

## 2019-07-17 MED ORDER — METOPROLOL TARTRATE 25 MG PO TABS: 25.0000 mg | ORAL_TABLET | Freq: Two times a day (BID) | ORAL | 1 refills | Status: DC

## 2019-07-17 MED ORDER — ROSUVASTATIN CALCIUM 20 MG PO TABS: 20.0000 mg | ORAL_TABLET | Freq: Every day | ORAL | 1 refills | Status: DC

## 2019-07-17 NOTE — Telephone Encounter (Signed)
Refill request complete, pharmacy will message pt

## 2019-07-19 NOTE — Progress Notes (Signed)
      [address]       [place] [address]             [phone]     CARDIOTHORACIC SURGERY OFFICE NOTE  Referring Provider is Bensimhon, Bevelyn Buckles, MD Primary Cardiologist is No primary care provider on file. PCP is Samuella Bruin   HPI:  54 year old gentleman presents for second outpatient visit after recent repair of type a dissection.  He presented with a stroke.  He underwent repair of the ascending aorta and did well.  His stroke symptoms are resolving slowly and mainly consist of aphasia.   Current Outpatient Medications  Medication Sig Dispense Refill  . amantadine (SYMMETREL) 100 MG capsule Take 2 capsules (200 mg total) by mouth daily. 60 capsule 0  . amLODipine (NORVASC) 10 MG tablet amlodipine 10 mg tablet    . aspirin EC 325 MG EC tablet Take 1 tablet (325 mg total) by mouth daily. 30 tablet 0  . colchicine 0.6 MG tablet Take 1 tablet (0.6 mg total) by mouth 2 (two) times daily. 60 tablet 0  . folic acid (FOLVITE) 1 MG tablet Take 1 tablet (1 mg total) by mouth daily. 30 tablet 0  . traMADol (ULTRAM) 50 MG tablet Take 1-2 tablets (50-100 mg total) by mouth every 4 (four) hours as needed for moderate pain. 30 tablet 0  . metoprolol tartrate (LOPRESSOR) 25 MG tablet Take 1 tablet (25 mg total) by mouth 2 (two) times daily. 180 tablet 1  . rosuvastatin (CRESTOR) 20 MG tablet Take 1 tablet (20 mg total) by mouth daily at 6 PM. 90 tablet 1   No current facility-administered medications for this visit.      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  Level 2                 10 minutes Level 3                 15 minutes Level 4                 25 minutes Level 5                 40 minutes  B.  Lorayne Marek, MD 07/19/2019 9:29 PM

## 2019-07-20 ENCOUNTER — Encounter: Payer: Self-pay | Admitting: Physical Medicine and Rehabilitation

## 2019-07-20 ENCOUNTER — Other Ambulatory Visit: Payer: Self-pay

## 2019-07-20 ENCOUNTER — Encounter: Payer: 59 | Attending: Physical Medicine and Rehabilitation | Admitting: Physical Medicine and Rehabilitation

## 2019-07-20 VITALS — BP 123/81 | HR 68 | Ht 72.0 in | Wt 257.0 lb

## 2019-07-20 DIAGNOSIS — I69320 Aphasia following cerebral infarction: Secondary | ICD-10-CM | POA: Diagnosis present

## 2019-07-20 DIAGNOSIS — I7101 Dissection of thoracic aorta: Secondary | ICD-10-CM | POA: Diagnosis present

## 2019-07-20 DIAGNOSIS — Z9889 Other specified postprocedural states: Secondary | ICD-10-CM | POA: Diagnosis present

## 2019-07-20 DIAGNOSIS — I639 Cerebral infarction, unspecified: Secondary | ICD-10-CM | POA: Diagnosis not present

## 2019-07-20 NOTE — Patient Instructions (Signed)
Pt is a 54 yr old male s/p aortic dissection and L opercular infarction here for f/u. Sternal precautions completed  1.Has 2 more weeks of Amantadine- 2 pills daily- then can stop-  IF speech gets worse off of Amantadine, can restart the same dose and would continue x 1 year total.   2. No signs of spasticity on exam- wouldn't worry/be concerned over this.  Has zero signs of spasticity, so extremely unlikely will occur.   3. When goes back to work, suggest taking scheduled breaks for a few minutes 2-3x/day-for ~ 5-10 minutes- which honestly, is a legal requirement anyway; because after a stroke, patients can have cognitive slowing when gets very fatigued, and want to avoid that situation.  Will let HR know this for pt.    4. Read aloud for ~ 3-5 minutes/day.  Don't have to come up with own words, but works on fluency.   5. F/U as needed

## 2019-07-20 NOTE — Progress Notes (Signed)
Subjective:    Patient ID: Erik Parker, male    DOB: Dec 13, 1965, 54 y.o.   MRN: [id number]  HPI  Pt is a 54 yr old male s/p aortic dissection and L opercular infarction here for f/u. Sternal precautions until 07/01/19   Off sternal precautions- as of 5/2 Going back to work 08/06/19 Feels pretty good- except still word finding deficits- some.    Originally no speech.  But now speaking, but is still nonfluent.   Still having sternal soreness, sometimes.  Sometimes will also hurt in L chest- and will break out in cold sweats- lasts 3-4 minutes- has occurred ~ 4x total.  Spicy food appears to trigger it and tums or rolaids helps.  Thinks might be heartburn- related.  Hasn't taken anything  Karolee Ohs like a PPI prilosec over the counter for it.    No BP issues.  Has been taking a break daily-    Occ is real sore, however no spasticity symptoms.  Still in SLP- outpatient- no other therapies.      Pain Inventory Average Pain 0 Pain Right Now 0 My pain is no pain  In the last 24 hours, has pain interfered with the following? General activity 0 Relation with others 0 Enjoyment of life 0 What TIME of day is your pain at its worst? no pain Sleep (in general) Fair  Pain is worse with: no pain Pain improves with: no pain Relief from Meds: no pain  Mobility walk without assistance ability to climb steps?  yes do you drive?  yes Do you have any goals in this area?  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  no  Neuro/Psych No problems in this area  Prior Studies Any changes since last visit?  no  Physicians involved in your care Any changes since last visit?  no   Family History  Problem Relation Age of Onset  . Hypertension Mother   . Hypertension Father    Social History   Socioeconomic History  . Marital status: Single    Spouse name: Not on file  . Number of children: Not on file  . Years  of education: Not on file  . Highest education level: Not on file  Occupational History  . Not on file  Tobacco Use  . Smoking status: Former Smoker    Packs/day: 0.50    Years: 9.00    Pack years: 4.50    Types: Cigarettes    Quit date: 05/01/2019    Years since quitting: 0.2  . Smokeless tobacco: Never Used  Substance and Sexual Activity  . Alcohol use: Not Currently    Alcohol/week: 1.0 standard drinks    Types: 1 Cans of beer per week    Comment: occ  . Drug use: No  . Sexual activity: Yes    Partners: Female    Birth control/protection: Pill  Other Topics Concern  . Not on file  Social History Narrative  . Not on file   Social Determinants of Health   Financial Resource Strain:   . Difficulty of Paying Living Expenses:   Food Insecurity:   . Worried About Programme researcher, broadcasting/film/video in the Last Year:   . Barista in the Last Year:   Transportation Needs:   . Freight forwarder (Medical):   Marland Kitchen Lack of Transportation (Non-Medical):   Physical Activity:   . Days of Exercise  per Week:   . Minutes of Exercise per Session:   Stress:   . Feeling of Stress :   Social Connections:   . Frequency of Communication with Friends and Family:   . Frequency of Social Gatherings with Friends and Family:   . Attends Religious Services:   . Active Member of Clubs or Organizations:   . Attends Archivist Meetings:   Marland Kitchen Marital Status:    Past Surgical History:  Procedure Laterality Date  . ACHILLES TENDON SURGERY Right 11/10/2016   Procedure: ACHILLES TENDON REPAIR VIA SPEED BRIDGE;  Surgeon: [name], DPM;  Location: [facility];  Service: Podiatry;  Laterality: Right;  . HEEL SPUR RESECTION Right 11/10/2016   Procedure: HEEL SPUR RESECTION (HAGLUNDS DEFORMITY);  Surgeon: [name], DPM;  Location: [facility];  Service: Podiatry;  Laterality: Right;  . REPAIR OF ACUTE ASCENDING THORACIC AORTIC DISSECTION N/A 05/01/2019   Procedure: REPAIR OF ACUTE ASCENDING  THORACIC AORTIC DISSECTION USING HEMOSHIELD GRAFT SIZE 28MM;  Surgeon: Wonda Olds, MD;  Location: [facility] OR;  Service: Cardiothoracic;  Laterality: N/A;   Past Medical History:  Diagnosis Date  . Hyperlipidemia   . Hypertension   . Stroke ([place])    Ht 6' (1.829 m)   Wt 257 lb (116.6 kg)   BMI 34.86 kg/m   Opioid Risk Score:   Fall Risk Score:  `1  Depression screen PHQ 2/9  No flowsheet data found.  Review of Systems  Constitutional: Negative.   HENT: Negative.   Eyes: Negative.   Respiratory: Negative.   Cardiovascular: Negative.   Gastrointestinal: Negative.   Endocrine: Negative.   Genitourinary: Negative.   Musculoskeletal: Negative.   Skin: Negative.   Allergic/Immunologic: Negative.   Neurological: Negative.   Hematological: Negative.   All other systems reviewed and are negative.      Objective:   Physical Exam  Awake, alert, appropriate, accompanied by wife, NAD  MS: 5/5 in UEs deltoids, biceps, triceps, WE, grip and finger abd B/L 5/5 in LEs HF, KE, DF and PF B/L  Neuro: No hoffman's, no clonus; no increased tone Intact to light touch sensation Mild R sided facial droop more notable with smile.      Assessment & Plan:  Pt is a 54 yr old male s/p aortic dissection and L opercular infarction here for f/u. Sternal precautions completed  1.Has 2 more weeks of Amantadine- 2 pills daily- then can stop-  IF speech gets worse off of Amantadine, can restart the same dose and would continue x 1 year total.   2. No signs of spasticity on exam- wouldn't worry/be concerned over this.  Has zero signs of spasticity, so extremely unlikely will occur.   3. When goes back to work, suggest taking scheduled breaks for a few minutes 2-3x/day-for ~ 5-10 minutes- which honestly, is a legal requirement anyway; because after a stroke, patients can have cognitive slowing when gets very fatigued, and want to avoid that situation.  Will let HR know this for pt.    4.  Read aloud for ~ 3-5 minutes/day.  Don't have to come up with own words, but works on fluency.   5. F/U as needed  I spent a total of 25 minutes on appointment- as detailed above.

## 2019-07-23 ENCOUNTER — Other Ambulatory Visit: Payer: Self-pay

## 2019-07-23 ENCOUNTER — Ambulatory Visit (HOSPITAL_COMMUNITY): Payer: 59 | Admitting: Speech Pathology

## 2019-07-23 ENCOUNTER — Encounter (HOSPITAL_COMMUNITY): Payer: Self-pay | Admitting: Speech Pathology

## 2019-07-23 DIAGNOSIS — R41841 Cognitive communication deficit: Secondary | ICD-10-CM | POA: Diagnosis not present

## 2019-07-23 DIAGNOSIS — I69890 Apraxia following other cerebrovascular disease: Secondary | ICD-10-CM

## 2019-07-23 DIAGNOSIS — R471 Dysarthria and anarthria: Secondary | ICD-10-CM

## 2019-07-23 NOTE — Therapy (Signed)
[place], [place], [address] Phone: [phone]   Fax:  [phone]  Speech Language Pathology Treatment  Patient Details  Name: Erik Parker MRN: [id number] Date of Birth: 10/30/1965 Referring Provider (SLP): Lauraine Rinne, PA-C   Encounter Date: 07/23/2019  End of Session - 07/23/19 0912    Visit Number  14    Number of Visits  17    Date for SLP Re-Evaluation  08/09/19    Authorization Type  [employer] NAP    SLP Start Time  0901    SLP Stop Time   0945    SLP Time Calculation (min)  44 min    Activity Tolerance  Patient tolerated treatment well       Past Medical History:  Diagnosis Date  . Hyperlipidemia   . Hypertension   . Stroke [facility])     Past Surgical History:  Procedure Laterality Date  . ACHILLES TENDON SURGERY Right 11/10/2016   Procedure: ACHILLES TENDON REPAIR VIA SPEED BRIDGE;  Surgeon: [name], DPM;  Location: [facility];  Service: Podiatry;  Laterality: Right;  . HEEL SPUR RESECTION Right 11/10/2016   Procedure: HEEL SPUR RESECTION (HAGLUNDS DEFORMITY);  Surgeon: [name], DPM;  Location: [facility];  Service: Podiatry;  Laterality: Right;  . REPAIR OF ACUTE ASCENDING THORACIC AORTIC DISSECTION N/A 05/01/2019   Procedure: REPAIR OF ACUTE ASCENDING THORACIC AORTIC DISSECTION USING HEMOSHIELD GRAFT SIZE 28MM;  Surgeon: Wonda Olds, MD;  [facility];  Service: Cardiothoracic;  Laterality: N/A;    There were no vitals filed for this visit.  Subjective Assessment - 07/23/19 0907    Subjective  "I go back to work June 7."    Currently in Pain?  No/denies       ADULT SLP TREATMENT - 07/23/19 0908      General Information   Behavior/Cognition  Alert;Cooperative;Pleasant mood    Patient Positioning  Upright in chair    Oral care provided  N/A    HPI  Erik Parker is a 54 year old right-handed male history of hypertension, hyperlipidemia as well as tobacco abuse.  Patient lives with spouse.  Independent prior to admission working full-time.  He presented on 05/01/2019 with right facial droop, chest pain, and right arm weakness. CT of the head showed left frontal operculum acute nonhemorrhagic infarction.  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 Orvan Seen.  Intraoperative echocardiogram with ejection fraction of 50%. Follow-up MRI of the brain showed scattered areas of acute infarction in both hemispheres, more extensive on the left than right, consistent with embolic infarctions.  The largest area of involvement 3.5-4 cm stroke in the left frontal operculum with mild swelling and minimal petechial blood products.  Scattered other strokes in the left hemisphere, next largest measuring 2 cm at the left parieto-occipital junction.  Single acute infarction in the right hemisphere measuring less than a centimeter in size in the right frontal lobe.  Acute postoperative pericarditis maintained on colchicine. Pt attended CIR 05/09/19-05/15/19. He was referred for OP SLP therapy evaluation and treatment by Lauraine Rinne, PA-C.       Treatment Provided   Treatment provided  Cognitive-Linquistic      Pain Assessment   Pain Assessment  No/denies pain      Cognitive-Linquistic Treatment   Treatment focused on  Aphasia;Apraxia;Dysarthria;Patient/family/caregiver education  Skilled Treatment    SLP provided skilled SLP treatment targeting apraxia and dysarthria. Pt benefited from cues to decrease rate and over articulate during oral reading of paragraphs. Pt was audio recorded and he acknowledged improvement in second reading when cued to focus on the strategies. Pt orally read short paragraph with ~80% intelligibility.        Assessment / Recommendations / Plan   Plan  Continue with current plan of care      Progression  Toward Goals   Progression toward goals  Progressing toward goals         SLP Short Term Goals - 07/23/19 0913      SLP SHORT TERM GOAL #1   Title  Pt will implement speech intelligibility strategies at the sentence level (decrease rate, self correct errors, self monitoring, checking to see if listener understands) for 90% intelligibility with min assist.    Baseline  75%    Time  4    Period  Weeks    Status  Achieved    Target Date  06/28/19      SLP SHORT TERM GOAL #2   Title  Pt will complete high level verbal expression tasks (object description, state function, provide short summary, etc) using short sentences with 90% acc and min assist.    Baseline  75% acc    Time  4    Period  Weeks    Status  Achieved    Target Date  06/28/19      SLP SHORT TERM GOAL #3   Title  Pt will complete short paragraph reading comprehension tasks with 80% acc and min assist via multiple choice response.    Baseline  75% sentence level with multiple choice responses    Time  4    Period  Weeks    Status  Achieved    Target Date  06/28/19      SLP SHORT TERM GOAL #4   Title  Pt will verbalize 7+ word sentence using subject, verb, object format when describing pictures with 90% acc with min cues.    Baseline  Pt primarily lists objects during picture description tasks    Time  4    Period  Weeks    Status  Achieved    Target Date  06/28/19      SLP SHORT TERM GOAL #5   Title  Pt will complete apraxia drills (nonsense words, multisyllabic words, progressive sentences) with 80% acc with mi/mod assist and use of strategies.    Baseline  70% nonsense words    Time  4    Period  Weeks    Status  Achieved    Target Date  06/28/19      SLP SHORT TERM GOAL #6   Title  Pt will implement speech intelligibility strategies at the conversation level (decrease rate, self correct errors, self monitoring, checking to see if listener understands) for 90% intelligibility with allowance for self  correction after SLP queries.    Baseline  75% conversation    Time  4    Period  Weeks    Status  On-going    Target Date  07/31/19      SLP SHORT TERM GOAL #7   Title  Pt will complete apraxia drills (nonsense words, multisyllabic words, progressive sentences) with 90% acc with min assist and use of strategies.    Baseline  90% mod assist    Time  4    Period  Weeks  Status  On-going    Target Date  07/31/19      SLP SHORT TERM GOAL #8   Title  Pt will produce /r/ in all positions of single word productions with 90% accuracy during oral reading or repetition of single words.    Baseline  75% with approximations    Time  4    Period  Weeks    Status  On-going    Target Date  07/31/19       SLP Long Term Goals - 07/23/19 0919      SLP LONG TERM GOAL #1   Title  Pt will communicate complex thoughts and feelings to [facility] with use of strategies.    Baseline  moderate impairment    Time  8    Period  Weeks    Status  On-going       Plan - 07/23/19 0913    Clinical Impression Statement  Pt continues to make progress and be highly motivated to improving speech/language skills. 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 He plans to return to work on June 7 for 12 hour shifts.He completes HEP as assigned daily.   Speech Therapy Frequency  2x / week    Duration  4 weeks    Treatment/Interventions  Compensatory strategies;Cueing hierarchy;Patient/family education;Functional tasks;SLP instruction and feedback;Compensatory techniques;Internal/external aids    Potential to Achieve Goals  Good    Potential Considerations  Severity of impairments    SLP Home Exercise Plan  Pt will complete HEP as assigned to facilitate carryover of treatment strategies and techniques in home environment with assist as needed.    Consulted and Agree with Plan of Care  Patient;Family member/caregiver    Family Member  Consulted  Wife, [name]       Patient will benefit from skilled therapeutic intervention in order to improve the following deficits and impairments:   Apraxia following other cerebrovascular disease  Dysarthria and anarthria  Cognitive communication deficit    Problem List Patient Active Problem List   Diagnosis Date Noted  . Monomorphic ventricular tachycardia (HCC) 05/22/2019  . Aphasia as late effect of cerebrovascular accident (CVA) 05/10/2019  . Dysphagia 05/10/2019  . History of open heart surgery   . Pericarditis   . Acute CVA (cerebrovascular accident) (HCC)   . Essential hypertension   . Dyslipidemia   . Tobacco abuse   . Tachypnea   . Leukocytosis   . Cerebral embolism with cerebral infarction 05/03/2019  . Aortic dissection (HCC) 05/01/2019  . 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 Bilateral hand numbness 02/26/2019  . Decreased grip strength 02/26/2019   Thank you,  Havery Moros, CCC-SLP [phone]  [facility] 07/23/2019, 9:19 AM  [facility] [address] [place], [place], [address] Phone: [phone]   Fax:  [phone]   Name: Erik Parker MRN: [id number] Date of Birth: October 09, 1965

## 2019-07-25 ENCOUNTER — Other Ambulatory Visit: Payer: Self-pay

## 2019-07-25 ENCOUNTER — Encounter (HOSPITAL_COMMUNITY): Payer: Self-pay | Admitting: Speech Pathology

## 2019-07-25 ENCOUNTER — Ambulatory Visit (HOSPITAL_COMMUNITY): Payer: 59 | Admitting: Speech Pathology

## 2019-07-25 DIAGNOSIS — R41841 Cognitive communication deficit: Secondary | ICD-10-CM | POA: Diagnosis not present

## 2019-07-25 DIAGNOSIS — I69890 Apraxia following other cerebrovascular disease: Secondary | ICD-10-CM

## 2019-07-25 DIAGNOSIS — R471 Dysarthria and anarthria: Secondary | ICD-10-CM

## 2019-07-25 NOTE — Therapy (Signed)
[place], [place], [address] Phone: [phone]   Fax:  [phone]  Speech Language Pathology Treatment  Patient Details  Name: Erik Parker MRN: [id number] Date of Birth: 1965-06-22 Referring Provider (SLP): Lauraine Rinne, PA-C   Encounter Date: 07/25/2019  End of Session - 07/25/19 0909    Visit Number  15    Number of Visits  17    Date for SLP Re-Evaluation  08/09/19    Authorization Type  [employer] NAP    SLP Start Time  0900    SLP Stop Time   0945    SLP Time Calculation (min)  45 min    Activity Tolerance  Patient tolerated treatment well       Past Medical History:  Diagnosis Date  . Hyperlipidemia   . Hypertension   . Stroke [facility])     Past Surgical History:  Procedure Laterality Date  . ACHILLES TENDON SURGERY Right 11/10/2016   Procedure: ACHILLES TENDON REPAIR VIA SPEED BRIDGE;  Surgeon: [name], DPM;  Location: [facility];  Service: Podiatry;  Laterality: Right;  . HEEL SPUR RESECTION Right 11/10/2016   Procedure: HEEL SPUR RESECTION (HAGLUNDS DEFORMITY);  Surgeon: [name], DPM;  Location: [facility];  Service: Podiatry;  Laterality: Right;  . REPAIR OF ACUTE ASCENDING THORACIC AORTIC DISSECTION N/A 05/01/2019   Procedure: REPAIR OF ACUTE ASCENDING THORACIC AORTIC DISSECTION USING HEMOSHIELD GRAFT SIZE 28MM;  Surgeon: Wonda Olds, MD;  Location: [facility] OR;  Service: Cardiothoracic;  Laterality: N/A;    There were no vitals filed for this visit.  Subjective Assessment - 07/25/19 0902    Subjective  "I didn't get much sleep last night."    Currently in Pain?  No/denies       ADULT SLP TREATMENT - 07/25/19 0903      General Information   Behavior/Cognition  Alert;Cooperative;Pleasant mood    Patient Positioning  Upright in chair    Oral care provided  N/A    HPI  Erik Parker. Mahar is a 54 year old right-handed male history of hypertension, hyperlipidemia as well as tobacco  abuse. Patient lives with spouse.  Independent prior to admission working full-time.  He presented on 05/01/2019 with right facial droop, chest pain, and right arm weakness. CT of the head showed left frontal operculum acute nonhemorrhagic infarction.  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 Orvan Seen.  Intraoperative echocardiogram with ejection fraction of 50%. Follow-up MRI of the brain showed scattered areas of acute infarction in both hemispheres, more extensive on the left than right, consistent with embolic infarctions.  The largest area of involvement 3.5-4 cm stroke in the left frontal operculum with mild swelling and minimal petechial blood products.  Scattered other strokes in the left hemisphere, next largest measuring 2 cm at the left parieto-occipital junction.  Single acute infarction in the right hemisphere measuring less than a centimeter in size in the right frontal lobe.  Acute postoperative pericarditis maintained on colchicine. Pt attended CIR 05/09/19-05/15/19. He was referred for OP SLP therapy evaluation and treatment by Lauraine Rinne, PA-C.       Treatment Provided   Treatment provided  Cognitive-Linquistic      Pain Assessment   Pain Assessment  No/denies pain      Cognitive-Linquistic Treatment   Treatment focused on  Aphasia;Apraxia;Dysarthria;Patient/family/caregiver education  Skilled Treatment    SLP provided skilled SLP treatment targeting apraxia, dysarthria, and word finding goals during version of Taboo game (Pt described a specific word but could not use highly associated words to describe it). Pt required cues to verbalize synonyms, think outside the box, and provide listener with feedback/reinforcement to gauge if moving in the right direction. Pt benefited from having extra time to prepare before attempting to  verbalize response. Pt orally read short paragraph with ~80% intelligibility.        Assessment / Recommendations / Plan   Plan  Continue with current plan of care      Progression Toward Goals   Progression toward goals  Progressing toward goals         SLP Short Term Goals - 07/25/19 0912      SLP SHORT TERM GOAL #1   Title  Pt will implement speech intelligibility strategies at the sentence level (decrease rate, self correct errors, self monitoring, checking to see if listener understands) for 90% intelligibility with min assist.    Baseline  75%    Time  4    Period  Weeks    Status  Achieved    Target Date  06/28/19      SLP SHORT TERM GOAL #2   Title  Pt will complete high level verbal expression tasks (object description, state function, provide short summary, etc) using short sentences with 90% acc and min assist.    Baseline  75% acc    Time  4    Period  Weeks    Status  Achieved    Target Date  06/28/19      SLP SHORT TERM GOAL #3   Title  Pt will complete short paragraph reading comprehension tasks with 80% acc and min assist via multiple choice response.    Baseline  75% sentence level with multiple choice responses    Time  4    Period  Weeks    Status  Achieved    Target Date  06/28/19      SLP SHORT TERM GOAL #4   Title  Pt will verbalize 7+ word sentence using subject, verb, object format when describing pictures with 90% acc with min cues.    Baseline  Pt primarily lists objects during picture description tasks    Time  4    Period  Weeks    Status  Achieved    Target Date  06/28/19      SLP SHORT TERM GOAL #5   Title  Pt will complete apraxia drills (nonsense words, multisyllabic words, progressive sentences) with 80% acc with mi/mod assist and use of strategies.    Baseline  70% nonsense words    Time  4    Period  Weeks    Status  Achieved    Target Date  06/28/19      SLP SHORT TERM GOAL #6   Title  Pt will implement speech intelligibility  strategies at the conversation level (decrease rate, self correct errors, self monitoring, checking to see if listener understands) for 90% intelligibility with allowance for self correction after SLP queries.    Baseline  75% conversation    Time  4    Period  Weeks    Status  On-going    Target Date  07/31/19      SLP SHORT TERM GOAL #7   Title  Pt will complete apraxia drills (nonsense words, multisyllabic words, progressive sentences) with 90% acc with  min assist and use of strategies.    Baseline  90% mod assist    Time  4    Period  Weeks    Status  On-going    Target Date  07/31/19      SLP SHORT TERM GOAL #8   Title  Pt will produce /r/ in all positions of single word productions with 90% accuracy during oral reading or repetition of single words.    Baseline  75% with approximations    Time  4    Period  Weeks    Status  On-going    Target Date  07/31/19       SLP Long Term Goals - 07/25/19 0916      SLP LONG TERM GOAL #1   Title  Pt will communicate complex thoughts and feelings to [facility] with use of strategies.    Baseline  moderate impairment    Time  8    Period  Weeks    Status  On-going       Plan - 07/25/19 0912    Clinical Impression Statement  Pt continues to make progress and be highly motivated to improving speech/language skills. He produced "l, r, s-blend" words in the initial, medial, and final positions of single words with 100% acc. He continues to have difficulty producing "dg" in the initial position of words, but produces with 100% acc in the medial and final which is improvement. 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 Oral reading, conversational speech, and producing multisyllabic (4+) words needs to be the focus of treatment sessions now.  Continue targeting expressive speech going forward.    Speech Therapy Frequency  2x / week    Duration  4 weeks    Treatment/Interventions  Compensatory strategies;Cueing  hierarchy;Patient/family education;Functional tasks;SLP instruction and feedback;Compensatory techniques;Internal/external aids    Potential to Achieve Goals  Good    Potential Considerations  Severity of impairments    SLP Home Exercise Plan  Pt will complete HEP as assigned to facilitate carryover of treatment strategies and techniques in home environment with assist as needed.    Consulted and Agree with Plan of Care  Patient;Family member/caregiver    Family Member Consulted  Wife, [name]       Patient will benefit from skilled therapeutic intervention in order to improve the following deficits and impairments:   Apraxia following other cerebrovascular disease  Dysarthria and anarthria  Cognitive communication deficit    Problem List Patient Active Problem List   Diagnosis Date Noted  . Monomorphic ventricular tachycardia (HCC) 05/22/2019  . Aphasia as late effect of cerebrovascular accident (CVA) 05/10/2019  . Dysphagia 05/10/2019  . History of open heart surgery   . Pericarditis   . Acute CVA (cerebrovascular accident) (HCC)   . Essential hypertension   . Dyslipidemia   . Tobacco abuse   . Tachypnea   . Leukocytosis   . Cerebral embolism with cerebral infarction 05/03/2019  . Aortic dissection (HCC) 05/01/2019  . 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 Bilateral hand numbness 02/26/2019  . Decreased grip strength 02/26/2019   Thank you,  Havery Moros, CCC-SLP [phone]  [facility] 07/25/2019, 9:16 AM  [facility] [address] [place], [place], [address] Phone: [phone]   Fax:  [phone]   Name: ZABDIEL DRIPPS MRN: [id number] Date of Birth: Apr 04, 1965

## 2019-07-31 ENCOUNTER — Telehealth: Payer: Self-pay | Admitting: *Deleted

## 2019-07-31 NOTE — Telephone Encounter (Signed)
Yes- I told him that this might occur.  So, just tell pt he can restart Amantadine at 100 mg daily x 3 days, then 200 mg daily- for aphasia   If he doesn't have enough medicine let me know, and I can prescribe more

## 2019-07-31 NOTE — Telephone Encounter (Signed)
Patients caregiver reports since stopping amantidine, patients speech has gotten progressively worse.  Asking if Dr. Berline Chough would please reinstate Rx

## 2019-08-01 ENCOUNTER — Encounter (HOSPITAL_COMMUNITY): Payer: Self-pay | Admitting: Speech Pathology

## 2019-08-01 ENCOUNTER — Ambulatory Visit (HOSPITAL_COMMUNITY): Payer: 59 | Attending: Physician Assistant | Admitting: Speech Pathology

## 2019-08-01 ENCOUNTER — Other Ambulatory Visit: Payer: Self-pay

## 2019-08-01 ENCOUNTER — Telehealth: Payer: Self-pay | Admitting: Physical Medicine and Rehabilitation

## 2019-08-01 ENCOUNTER — Telehealth: Payer: Self-pay

## 2019-08-01 DIAGNOSIS — I69890 Apraxia following other cerebrovascular disease: Secondary | ICD-10-CM | POA: Insufficient documentation

## 2019-08-01 DIAGNOSIS — R471 Dysarthria and anarthria: Secondary | ICD-10-CM | POA: Diagnosis present

## 2019-08-01 DIAGNOSIS — R41841 Cognitive communication deficit: Secondary | ICD-10-CM | POA: Insufficient documentation

## 2019-08-01 MED ORDER — AMANTADINE HCL 100 MG PO CAPS
200.0000 mg | ORAL_CAPSULE | Freq: Every day | ORAL | 9 refills | Status: DC
Start: 2019-08-01 — End: 2019-08-02

## 2019-08-01 NOTE — Telephone Encounter (Signed)
Clinical records faxed to [facility] @1 - contact is [phone]/ [phone] Mr Toth was informed that he will need to contact [facility] for information about his stroke DX and their records being sent over.

## 2019-08-01 NOTE — Telephone Encounter (Signed)
Patient's caregiver notified.

## 2019-08-01 NOTE — Telephone Encounter (Signed)
Sent in amantadine for pt 200 mg daily is fine- doesn't need to take 2x/day.   Sent in 9 RFs

## 2019-08-01 NOTE — Therapy (Signed)
[facility] [address] [place], [place], [address] Phone: [phone]   Fax:  [phone]  Speech Language Pathology Treatment  Patient Details  Name: Erik Parker MRN: [id number] Date of Birth: [birth year] Referring Provider (SLP): Mariam Dollar, PA-C   Encounter Date: 08/01/2019  End of Session - 08/01/19 0934    Visit Number  16    Number of Visits  17    Date for SLP Re-Evaluation  08/09/19    Authorization Type  [employer] NAP    SLP Start Time  0909    SLP Stop Time   0950    SLP Time Calculation (min)  41 min    Activity Tolerance  Patient tolerated treatment well       Past Medical History:  Diagnosis Date  . Hyperlipidemia   . Hypertension   . Stroke [facility])     Past Surgical History:  Procedure Laterality Date  . ACHILLES TENDON SURGERY Right 11/10/2016   Procedure: ACHILLES TENDON REPAIR VIA SPEED BRIDGE;  Surgeon: [name], DPM;  Location: [facility];  Service: Podiatry;  Laterality: Right;  . HEEL SPUR RESECTION Right 11/10/2016   Procedure: HEEL SPUR RESECTION (HAGLUNDS DEFORMITY);  Surgeon: [name], DPM;  Location: [facility];  Service: Podiatry;  Laterality: Right;  . REPAIR OF ACUTE ASCENDING THORACIC AORTIC DISSECTION N/A 05/01/2019   Procedure: REPAIR OF ACUTE ASCENDING THORACIC AORTIC DISSECTION USING HEMOSHIELD GRAFT SIZE ;  Surgeon: Linden Dolin, MD;  Location[facility] OR;  Service: Cardiothoracic;  Laterality: N/A;    There were no vitals filed for this visit.  Subjective Assessment - 08/01/19 0931    Subjective  "I ran out of my Amantadine and my speech is worse."    Currently in Pain?  No/denies        ADULT SLP TREATMENT - 08/01/19 0932      General Information   Behavior/Cognition  Alert;Cooperative;Pleasant mood    Patient Positioning  Upright in chair    Oral care provided  N/A    HPI  Erik Parker is a 54 year old right-handed male history of hypertension, hyperlipidemia as  well as tobacco abuse. Patient lives with spouse.  Independent prior to admission working full-time.  He presented on 05/01/2019 with right facial droop, chest pain, and right arm weakness. CT of the head showed left frontal operculum acute nonhemorrhagic infarction.  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 Vickey Sages.  Intraoperative echocardiogram with ejection fraction of 50%. Follow-up MRI of the brain showed scattered areas of acute infarction in both hemispheres, more extensive on the left than right, consistent with embolic infarctions.  The largest area of involvement 3.5-4 cm stroke in the left frontal operculum with mild swelling and minimal petechial blood products.  Scattered other strokes in the left hemisphere, next largest measuring 2 cm at the left parieto-occipital junction.  Single acute infarction in the right hemisphere measuring less than a centimeter in size in the right frontal lobe.  Acute postoperative pericarditis maintained on colchicine. Pt attended CIR 05/09/19-05/15/19. He was referred for OP SLP therapy evaluation and treatment by Mariam Dollar, PA-C.       Treatment Provided   Treatment provided  Cognitive-Linquistic      Pain Assessment   Pain Assessment  No/denies pain      Cognitive-Linquistic Treatment   Treatment focused on  Aphasia;Apraxia;Dysarthria;Patient/family/caregiver education    Skilled Treatment SLP provided skilled interventions targeting apraxia and dysarthria goals at the single word level for targeted sounds (s-blends, dg, l, and r), oral reading of short paragraphs, and during conversational speech. Pt recently stopped taking his Amantadine a couple of days ago and reports that both he and his wife have noticed a decline in his speech since going off it. SLP sent a message to his MD who has called in  Rx for Amantadine.      Assessment / Recommendations / Plan   Plan  Continue with current plan of care      Progression Toward Goals   Progression toward goals  Progressing toward goals       SLP Education - 08/01/19 0934    Education Details  continue with HEP proivded    Person(s) Educated  Patient    Methods  Explanation;Handout    Comprehension  Verbalized understanding       SLP Short Term Goals - 08/01/19 0936      SLP SHORT TERM GOAL #1   Title  Pt will implement speech intelligibility strategies at the sentence level (decrease rate, self correct errors, self monitoring, checking to see if listener understands) for 90% intelligibility with min assist.    Baseline  75%    Time  4    Period  Weeks    Status  Achieved    Target Date  06/28/19      SLP SHORT TERM GOAL #2   Title  Pt will complete high level verbal expression tasks (object description, state function, provide short summary, etc) using short sentences with 90% acc and min assist.    Baseline  75% acc    Time  4    Period  Weeks    Status  Achieved    Target Date  06/28/19      SLP SHORT TERM GOAL #3   Title  Pt will complete short paragraph reading comprehension tasks with 80% acc and min assist via multiple choice response.    Baseline  75% sentence level with multiple choice responses    Time  4    Period  Weeks    Status  Achieved    Target Date  06/28/19      SLP SHORT TERM GOAL #4   Title  Pt will verbalize 7+ word sentence using subject, verb, object format when describing pictures with 90% acc with min cues.    Baseline  Pt primarily lists objects during picture description tasks    Time  4    Period  Weeks    Status  Achieved    Target Date  06/28/19      SLP SHORT TERM GOAL #5   Title  Pt will complete apraxia drills (nonsense words, multisyllabic words, progressive sentences) with 80% acc with mi/mod assist and use of strategies.    Baseline  70% nonsense words    Time  4    Period   Weeks    Status  Achieved    Target Date  06/28/19      SLP SHORT TERM GOAL #6   Title  Pt will implement speech intelligibility strategies at the conversation level (decrease rate, self correct errors, self monitoring, checking to see if listener understands) for 90% intelligibility with allowance for self correction after SLP queries.    Baseline  75% conversation    Time  4    Period  Weeks    Status  On-going    Target Date  07/31/19      SLP SHORT TERM GOAL #7   Title  Pt will complete apraxia drills (nonsense words, multisyllabic words, progressive sentences) with 90% acc with min assist and use of strategies.    Baseline  90% mod assist    Time  4    Period  Weeks    Status  On-going    Target Date  07/31/19      SLP SHORT TERM GOAL #8   Title  Pt will produce /r/ in all positions of single word productions with 90% accuracy during oral reading or repetition of single words.    Baseline  75% with approximations    Time  4    Period  Weeks    Status  On-going    Target Date  07/31/19       SLP Long Term Goals - 08/01/19 0948      SLP LONG TERM GOAL #1   Title  Pt will communicate complex thoughts and feelings to [facility] with use of strategies.    Baseline  moderate impairment    Time  8    Period  Weeks    Status  On-going       Plan - 08/01/19 0936    Clinical Impression Statement  Pt continues to make progress and be highly motivated to improving speech/language skills. He produced "l, r, s-blend" words in the initial, medial, and final positions of single words with 96-100% acc. Improvement noted with "dg" words in all positions. SLP did not notice a great difference in speech intelligibility today, however Pt reports that his speech feels more effortful and slow. He will return to work on Monday. Continue targeting expressive speech going forward.    Speech Therapy Frequency  2x / week    Duration  4 weeks    Treatment/Interventions  Compensatory strategies;Cueing  hierarchy;Patient/family education;Functional tasks;SLP instruction and feedback;Compensatory techniques;Internal/external aids    Potential to Achieve Goals  Good    Potential Considerations  Severity of impairments    SLP Home Exercise Plan  Pt will complete HEP as assigned to facilitate carryover of treatment strategies and techniques in home environment with assist as needed.    Consulted and Agree with Plan of Care  Patient;Family member/caregiver    Family Member Consulted  Wife, [name]       Patient will benefit from skilled therapeutic intervention in order to improve the following deficits and impairments:   Apraxia following other cerebrovascular disease  Dysarthria and anarthria  Cognitive communication deficit    Problem List Patient Active Problem List   Diagnosis Date Noted  . Monomorphic ventricular tachycardia (HCC) 05/22/2019  . Aphasia as late effect of cerebrovascular accident (CVA) 05/10/2019  . Dysphagia 05/10/2019  . History of open heart surgery   . Pericarditis   . Acute CVA (cerebrovascular accident) (HCC)   . Essential hypertension   . Dyslipidemia   . Tobacco abuse   . Tachypnea   . Leukocytosis   . Cerebral embolism with cerebral infarction 05/03/2019  . Aortic dissection (HCC) 05/01/2019  . 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 Bilateral hand numbness 02/26/2019  . Decreased grip strength 02/26/2019   Thank you,  Havery Moros, CCC-SLP [phone]  [facility] 08/01/2019, 9:52 AM  [facility] [address] [place], [place], [address] Phone: [phone]   Fax:  [phone]   Name: CHRISTON PARADA MRN: [id number] Date of Birth: Dec 20, 1965

## 2019-08-02 ENCOUNTER — Other Ambulatory Visit: Payer: Self-pay | Admitting: Physical Medicine and Rehabilitation

## 2019-08-02 ENCOUNTER — Encounter (HOSPITAL_COMMUNITY): Payer: Self-pay | Admitting: Speech Pathology

## 2019-08-02 ENCOUNTER — Telehealth: Payer: Self-pay

## 2019-08-02 ENCOUNTER — Ambulatory Visit (HOSPITAL_COMMUNITY): Payer: 59 | Admitting: Speech Pathology

## 2019-08-02 DIAGNOSIS — R41841 Cognitive communication deficit: Secondary | ICD-10-CM

## 2019-08-02 DIAGNOSIS — I69890 Apraxia following other cerebrovascular disease: Secondary | ICD-10-CM

## 2019-08-02 DIAGNOSIS — R471 Dysarthria and anarthria: Secondary | ICD-10-CM

## 2019-08-02 MED ORDER — AMANTADINE HCL 100 MG PO CAPS: 200.0000 mg | ORAL_CAPSULE | Freq: Every day | ORAL | 9 refills | Status: DC

## 2019-08-02 NOTE — Therapy (Signed)
[place], [place], [address] Phone: [phone]   Fax:  [phone]  Speech Language Pathology Treatment  Patient Details  Name: Erik Parker MRN: [id number] Date of Birth: 01-29-1966 Referring Provider (SLP): Lauraine Rinne, PA-C   Encounter Date: 08/02/2019  End of Session - 08/02/19 1028    Visit Number  17    Number of Visits  17    Date for SLP Re-Evaluation  08/09/19    Authorization Type  Aetna NAP    SLP Start Time  0900    SLP Stop Time   0945    SLP Time Calculation (min)  45 min    Activity Tolerance  Patient tolerated treatment well       Past Medical History:  Diagnosis Date  . Hyperlipidemia   . Hypertension   . Stroke [facility])     Past Surgical History:  Procedure Laterality Date  . ACHILLES TENDON SURGERY Right 11/10/2016   Procedure: ACHILLES TENDON REPAIR VIA SPEED BRIDGE;  Surgeon: [name], DPM;  Location: [facility];  Service: Podiatry;  Laterality: Right;  . HEEL SPUR RESECTION Right 11/10/2016   Procedure: HEEL SPUR RESECTION (HAGLUNDS DEFORMITY);  Surgeon: [name], DPM;  Location: [facility];  Service: Podiatry;  Laterality: Right;  . REPAIR OF ACUTE ASCENDING THORACIC AORTIC DISSECTION N/A 05/01/2019   Procedure: REPAIR OF ACUTE ASCENDING THORACIC AORTIC DISSECTION USING HEMOSHIELD GRAFT SIZE 28MM;  Surgeon: Wonda Olds, MD;  Location: [facility] OR;  Service: Cardiothoracic;  Laterality: N/A;    There were no vitals filed for this visit.  Subjective Assessment - 08/02/19 0910    Subjective  "On the countdown to go back to work."    Currently in Pain?  No/denies         ADULT SLP TREATMENT - 08/02/19 0001      General Information   Behavior/Cognition  Alert;Cooperative;Pleasant mood    Patient Positioning  Upright in chair    Oral care provided  N/A    HPI  Erik Parker is a 54 year old right-handed male history of hypertension, hyperlipidemia as well as  tobacco abuse. Patient lives with spouse.  Independent prior to admission working full-time.  He presented on 05/01/2019 with right facial droop, chest pain, and right arm weakness. CT of the head showed left frontal operculum acute nonhemorrhagic infarction.  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 Orvan Seen.  Intraoperative echocardiogram with ejection fraction of 50%. Follow-up MRI of the brain showed scattered areas of acute infarction in both hemispheres, more extensive on the left than right, consistent with embolic infarctions.  The largest area of involvement 3.5-4 cm stroke in the left frontal operculum with mild swelling and minimal petechial blood products.  Scattered other strokes in the left hemisphere, next largest measuring 2 cm at the left parieto-occipital junction.  Single acute infarction in the right hemisphere measuring less than a centimeter in size in the right frontal lobe.  Acute postoperative pericarditis maintained on colchicine. Pt attended CIR 05/09/19-05/15/19. He was referred for OP SLP therapy evaluation and treatment by Lauraine Rinne, PA-C.       Treatment Provided   Treatment provided  Cognitive-Linquistic      Pain Assessment   Pain Assessment  No/denies pain      Cognitive-Linquistic Treatment   Treatment focused on  Aphasia;Apraxia;Dysarthria;Patient/family/caregiver  education    Skilled Treatment SLP provided education for post discharge recommendations as Pt will return to work on Monday. See below.     Assessment / Recommendations / Plan   Plan  Discharge SLP treatment due to (comment)      Progression Toward Goals   Progression toward goals  Goals met, education completed, patient discharged from [place] [facility] - 08/02/19 1028    Education Details  Provided discharge recommendations; see below     Person(s) Educated  Patient    Methods  Handout    Comprehension  Verbalized understanding       SLP Short Term Goals - 08/02/19 1031      SLP SHORT TERM GOAL #1   Title  Pt will implement speech intelligibility strategies at the sentence level (decrease rate, self correct errors, self monitoring, checking to see if listener understands) for 90% intelligibility with min assist.    Baseline  75%    Time  4    Period  Weeks    Status  Achieved    Target Date  06/28/19      SLP SHORT TERM GOAL #2   Title  Pt will complete high level verbal expression tasks (object description, state function, provide short summary, etc) using short sentences with 90% acc and min assist.    Baseline  75% acc    Time  4    Period  Weeks    Status  Achieved    Target Date  06/28/19      SLP SHORT TERM GOAL #3   Title  Pt will complete short paragraph reading comprehension tasks with 80% acc and min assist via multiple choice response.    Baseline  75% sentence level with multiple choice responses    Time  4    Period  Weeks    Status  Achieved    Target Date  06/28/19      SLP SHORT TERM GOAL #4   Title  Pt will verbalize 7+ word sentence using subject, verb, object format when describing pictures with 90% acc with min cues.    Baseline  Pt primarily lists objects during picture description tasks    Time  4    Period  Weeks    Status  Achieved    Target Date  06/28/19      SLP SHORT TERM GOAL #5   Title  Pt will complete apraxia drills (nonsense words, multisyllabic words, progressive sentences) with 80% acc with mi/mod assist and use of strategies.    Baseline  70% nonsense words    Time  4    Period  Weeks    Status  Achieved    Target Date  06/28/19      SLP SHORT TERM GOAL #6   Title  Pt will implement speech intelligibility strategies at the conversation level (decrease rate, self correct errors, self monitoring, checking to see if listener understands) for 90% intelligibility with  allowance for self correction after SLP queries.    Baseline  75% conversation    Time  4    Period  Weeks    Status  Achieved    Target Date  07/31/19      SLP SHORT TERM GOAL #7   Title  Pt will complete apraxia drills (nonsense words, multisyllabic words, progressive sentences) with 90% acc with min assist and use of strategies.    Baseline  90% mod assist  Time  4    Period  Weeks    Status  Achieved    Target Date  07/31/19      SLP SHORT TERM GOAL #8   Title  Pt will produce /r/ in all positions of single word productions with 90% accuracy during oral reading or repetition of single words.    Baseline  75% with approximations    Time  4    Period  Weeks    Status  Achieved    Target Date  07/31/19       SLP Long Term Goals - 08/02/19 1032      SLP LONG TERM GOAL #1   Title  Pt will communicate complex thoughts and feelings to [facility] with use of strategies.    Baseline  moderate impairment    Time  8    Period  Weeks    Status  On-going       Plan - 08/02/19 1030    Clinical Impression Statement Pt seen for final session in preparation for discharge. He was unable to fill his Rx for Amantadine because the pharmacy said he had to be mail ordered. I contacted Dr. Dagoberto Ligas who is contacting the pharmacy. Pt continues to notice a decline in his speech after stopping the Amantadine. The SLUMS was administered again today and Pt achieved 26/30 (previously 14/30) with errors on hand placement for the clock and auditory comprehension/recall of paragraph. Pt has made great improvement, however still presents with apraxia and dysarthria. 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 He plans to use gestures as needed and take individuals into a quiet office for more formal conversations. Pt will be discharged from SLP services at this time due to being satisfied with current achievements and  plans to return to work full time.    Treatment/Interventions  Compensatory strategies;Cueing hierarchy;Patient/family education;Functional tasks;SLP instruction and feedback;Compensatory techniques;Internal/external aids    Potential to Achieve Goals  Good    Potential Considerations  Severity of impairments    SLP Home Exercise Plan  Pt will complete HEP as assigned to facilitate carryover of treatment strategies and techniques in home environment with assist as needed.    Consulted and Agree with Plan of Care  Patient;Family member/caregiver    Family Member Consulted  Wife, Erik Parker      Recommendations post discharge: . Schedule rest breaks throughout the day (ease back in to your work schedule) . Establish good self-care routines for rest, sleep, exercise, and eating . Practice reading short paragraphs aloud and focus on accuracy over speed . Continue to read aloud your list of multisyllabic words . Play word games with family (Taboo, Nature conservation officer, etc) . Verbally practice key words from work (names of co-workers, vocab from the job) . Prepare for your meetings, take notes, use outlines, bullet points . Keep a journal/word list to document what still gives you trouble . Work words: Neurosurgeon, sort line, wash line, prisma, extrusion, pellets, rollers, auger, float sink tank, conveyors, Water engineer, holding tanks, [place], [address] [place], [place], [place], [place], [place], [place]en [place], [place], [place], [place], [place], [place], [place], [place], [place], [place], [place], [place], [place], [place], [place], [place], [place]   Patient will benefit from skilled therapeutic intervention in order to improve the following deficits and impairments:   Apraxia following other cerebrovascular disease  Dysarthria and anarthria  Cognitive communication deficit  Problem List Patient Active Problem List   Diagnosis Date Noted  . Monomorphic ventricular tachycardia ([place]) 05/22/2019   . Aphasia as late effect of cerebrovascular accident (CVA) 05/10/2019  . Dysphagia 05/10/2019  . History of open heart surgery   . Pericarditis   . Acute CVA (cerebrovascular accident) ([place])   . Essential hypertension   . Dyslipidemia   . Tobacco abuse   . Tachypnea   . Leukocytosis   . Cerebral embolism with cerebral infarction 05/03/2019  . Aortic dissection ([place]) 05/01/2019  . 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 Bilateral hand numbness 02/26/2019  . Decreased grip strength 02/26/2019   SPEECH THERAPY DISCHARGE SUMMARY  Visits from Start of Care: 17  Current functional level related to goals / functional outcomes: See above   Remaining deficits: See above   Education / Equipment: Pt given discharge recommendations Plan: Patient agrees to discharge.  Patient goals were met. Patient is being discharged due to being pleased with the current functional level.  ?????         Thank you,  Genene Churn, [place]  [facility] 08/02/2019, 10:35 AM  [place] [address] [place], [place], [address] Phone: [phone]   Fax:  [phone]   Name: Erik Parker MRN: [id number] Date of Birth: 01/27/1966

## 2019-08-02 NOTE — Telephone Encounter (Signed)
Ptn significant other? Rosey Bath left voicemail stating they got a call regarding Amantadine (she thinks) only comes mail order  -- they need to now what to do can't wait phone [phone]

## 2019-08-02 NOTE — Telephone Encounter (Signed)
[facility] says that insurance requires after 1-2 fills of medication that it be sent to mail order pharmacy.  He says that a 5 day supply can be picked up and the order needs to be sent to [facility]. I have sent the order to [facility] and let [name] (his spouse ) know.

## 2019-08-03 ENCOUNTER — Other Ambulatory Visit: Payer: Self-pay

## 2019-08-03 MED ORDER — METOPROLOL TARTRATE 25 MG PO TABS: 25.0000 mg | ORAL_TABLET | Freq: Two times a day (BID) | ORAL | 3 refills | Status: DC

## 2019-08-03 MED ORDER — ROSUVASTATIN CALCIUM 20 MG PO TABS: 20.0000 mg | ORAL_TABLET | Freq: Every day | ORAL | 3 refills | Status: DC

## 2019-08-03 MED ORDER — AMLODIPINE BESYLATE 10 MG PO TABS: ORAL_TABLET | ORAL | 3 refills | Status: DC

## 2019-08-03 NOTE — Telephone Encounter (Signed)
Refilled crestor amlodipine, metoprolol

## 2019-08-07 ENCOUNTER — Other Ambulatory Visit: Payer: Self-pay | Admitting: *Deleted

## 2019-09-11 ENCOUNTER — Encounter: Payer: Self-pay | Admitting: Vascular Surgery

## 2019-09-11 ENCOUNTER — Other Ambulatory Visit: Payer: Self-pay

## 2019-09-11 ENCOUNTER — Ambulatory Visit (INDEPENDENT_AMBULATORY_CARE_PROVIDER_SITE_OTHER): Payer: 59 | Admitting: Vascular Surgery

## 2019-09-11 VITALS — BP 129/78 | HR 63 | Temp 97.9°F | Resp 20 | Ht 72.0 in | Wt 258.0 lb

## 2019-09-11 DIAGNOSIS — I7101 Dissection of thoracic aorta: Secondary | ICD-10-CM

## 2019-09-11 DIAGNOSIS — I71019 Dissection of thoracic aorta, unspecified: Secondary | ICD-10-CM

## 2019-09-11 NOTE — Progress Notes (Signed)
Vascular and Vein Specialist of [facility]  Patient name: Erik Parker MRN: [id number] DOB: 01/23/1966 Sex: male  REASON FOR CONSULT: Evaluation descending thoracic aortic dissection  HPI: Erik Parker is a 54 y.o. male, who is here today for evaluation of descending thoracic aortic dissection.  He presented with acute ascending dissection and underwent emergency surgery with Dr. Vickey Sages on 05/01/2019.  He suffered preoperative stroke leaving him with aphasia.  He has done extremely well in his recovery.  He does have some word searching but this is improving.  He has returned to full-time work with 12-hour shifts.  He reports generalized soreness in his shoulders and hips and legs.  I do not know the etiology of this but reassured him that this would not be related to his dissection.  He has no history of malperfusion from his descending dissection.  He recently was seen by Dr. Vickey Sages in May 2021 with a repeat postoperative CT scan.  This showed good result from his ascending arch replacement.  He did have persistent descending thoracic component and was referred to our office for follow-up.  Past Medical History:  Diagnosis Date  . Hyperlipidemia   . Hypertension   . Stroke [facility])     Family History  Problem Relation Age of Onset  . Hypertension Mother   . Hypertension Father     SOCIAL HISTORY: Social History   Socioeconomic History  . Marital status: Single    Spouse name: Not on file  . Number of children: Not on file  . Years of education: Not on file  . Highest education level: Not on file  Occupational History  . Not on file  Tobacco Use  . Smoking status: Former Smoker    Packs/day: 0.50    Years: 9.00    Pack years: 4.50    Types: Cigarettes    Quit date: 05/01/2019    Years since quitting: 0.3  . Smokeless tobacco: Never Used  Vaping Use  . Vaping Use: Never used  Substance and Sexual Activity  . Alcohol use: Yes     Alcohol/week: 1.0 standard drink    Types: 1 Cans of beer per week    Comment: occ  . Drug use: No  . Sexual activity: Yes    Partners: Female    Birth control/protection: Pill  Other Topics Concern  . Not on file  Social History Narrative  . Not on file   Social Determinants of Health   Financial Resource Strain:   . Difficulty of Paying Living Expenses:   Food Insecurity:   . Worried About Programme researcher, broadcasting/film/video in the Last Year:   . Barista in the Last Year:   Transportation Needs:   . Freight forwarder (Medical):   Marland Kitchen Lack of Transportation (Non-Medical):   Physical Activity:   . Days of Exercise per Week:   . Minutes of Exercise per Session:   Stress:   . Feeling of Stress :   Social Connections:   . Frequency of Communication with Friends and Family:   . Frequency of Social Gatherings with Friends and Family:   . Attends Religious Services:   . Active Member of Clubs or Organizations:   . Attends Banker Meetings:   Marland Kitchen Marital Status:   Intimate Partner Violence:   . Fear of Current or Ex-Partner:   . Emotionally Abused:   Marland Kitchen Physically Abused:   . Sexually Abused:     No  Known Allergies  Current Outpatient Medications  Medication Sig Dispense Refill  . amantadine (SYMMETREL) 100 MG capsule Take 2 capsules (200 mg total) by mouth daily. For aphasia- 60 capsule 9  . amLODipine (NORVASC) 10 MG tablet amlodipine 10 mg tablet 90 tablet 3  . aspirin EC 325 MG EC tablet Take 1 tablet (325 mg total) by mouth daily. 30 tablet 0  . folic acid (FOLVITE) 1 MG tablet Take 1 tablet (1 mg total) by mouth daily. 30 tablet 0  . hydrochlorothiazide (HYDRODIURIL) 25 MG tablet Take 25 mg by mouth daily.    . metoprolol tartrate (LOPRESSOR) 25 MG tablet Take 1 tablet (25 mg total) by mouth 2 (two) times daily. 180 tablet 3  . rosuvastatin (CRESTOR) 20 MG tablet Take 1 tablet (20 mg total) by mouth daily at 6 PM. 90 tablet 3   No current facility-administered  medications for this visit.    REVIEW OF SYSTEMS:  [X]  denotes positive finding, [ ]  denotes negative finding Cardiac  Comments:  Chest pain or chest pressure:    Shortness of breath upon exertion:    Short of breath when lying flat:    Irregular heart rhythm:        Vascular    Pain in calf, thigh, or hip brought on by ambulation:    Pain in feet at night that wakes you up from your sleep:     Blood clot in your veins:    Leg swelling:         Pulmonary    Oxygen at home:    Productive cough:     Wheezing:         Neurologic    Sudden weakness in arms or legs:     Sudden numbness in arms or legs:     Sudden onset of difficulty speaking or slurred speech:    Temporary loss of vision in one eye:     Problems with dizziness:         Gastrointestinal    Blood in stool:     Vomited blood:         Genitourinary    Burning when urinating:     Blood in urine:        Psychiatric    Major depression:         Hematologic    Bleeding problems:    Problems with blood clotting too easily:        Skin    Rashes or ulcers:        Constitutional    Fever or chills:      PHYSICAL EXAM: Vitals:   09/11/19 1018  BP: 129/78  Pulse: 63  Resp: 20  Temp: 97.9 F (36.6 C)  SpO2: 96%  Weight: 258 lb (117 kg)  Height: 6' (1.829 m)    GENERAL: The patient is a well-nourished male, in no acute distress. The vital signs are documented above. CARDIOVASCULAR: Carotids without bruits bilaterally.  2+ radial 2+ femoral and 2+ dorsalis pedis pulses bilaterally PULMONARY: There is good air exchange  ABDOMEN: Soft and non-tender  MUSCULOSKELETAL: There are no major deformities or cyanosis. NEUROLOGIC: No focal weakness or paresthesias are detected. SKIN: There are no ulcers or rashes noted. PSYCHIATRIC: The patient has a normal affect.  DATA:  CT scan from 06/25/2019 was reviewed.  This did show dissection extending into the left subclavian artery and aortic arch ending in his  proximal descending thoracic aorta.  MEDICAL ISSUES: I discussed the  significance of his descending dissection with the patient.  Explained the importance of ongoing surveillance of this.  He is to have a repeat CT scan of his chest ordered by Dr. Vickey Sages in May 2022.  We will see him in our office following this for ongoing follow-up of his descending thoracic dissection   Larina Earthly, MD [facility] Vascular and Vein Specialists of [facility] Tel [phone] Pager [phone]

## 2019-09-27 ENCOUNTER — Ambulatory Visit (INDEPENDENT_AMBULATORY_CARE_PROVIDER_SITE_OTHER): Payer: 59 | Admitting: Adult Health

## 2019-09-27 ENCOUNTER — Encounter: Payer: Self-pay | Admitting: Adult Health

## 2019-09-27 VITALS — BP 129/81 | HR 62 | Ht 72.0 in | Wt 259.0 lb

## 2019-09-27 DIAGNOSIS — I71019 Dissection of thoracic aorta, unspecified: Secondary | ICD-10-CM

## 2019-09-27 DIAGNOSIS — I7101 Dissection of thoracic aorta: Secondary | ICD-10-CM

## 2019-09-27 DIAGNOSIS — R471 Dysarthria and anarthria: Secondary | ICD-10-CM

## 2019-09-27 NOTE — Patient Instructions (Signed)
Continue to follow with vascular surgery and cardiothoracic surgery for dissection surveillance monitoring  Please speak further with your PCP in regards to left-sided chest symptoms for further evaluation for possible need of further cardiac evaluation  Continue aspirin 325 mg daily  and Crestor for secondary stroke prevention  Continue to follow up with PCP regarding cholesterol and blood pressure management  Maintain strict control of hypertension with blood pressure goal below 130/90, diabetes with hemoglobin A1c goal below 6.5% and cholesterol with LDL cholesterol (bad cholesterol) goal below 70 mg/dL.     Follow-up in 6 months or call earlier if needed     Thank you for coming to see [place] at [facility]. I hope we have been able to provide you high quality care today.  You may receive a patient satisfaction survey over the next few weeks. We would appreciate your feedback and comments so that we may continue to improve ourselves and the health of our patients.

## 2019-09-27 NOTE — Progress Notes (Signed)
I agree with the above plan 

## 2019-09-27 NOTE — Progress Notes (Addendum)
[facility] [address] [place]. [place] [address] [phone]       OFFICE FOLLOW-UP NOTE  [name] Date of Birth:  04-11-1965 Medical Record Number:  [id number]    Chief complaint: Chief Complaint  Patient presents with  . Cerebrovascular Accident    3 month FU  "doing well"      HPI:   Today, 09/27/2019, Erik Parker returns for stroke follow-up.  Residual deficits of left facial weakness, dysarthria and apraxia-improving with completion of therapy Return to full-time work with 12-hour shifts without difficulty  Continues on aspirin 325 mg daily and Crestor for secondary stroke prevention while side effects Blood pressure stable 129/81  Follow-up with cardiothoracic with repeat CT scan 06/25/2019 showed dissection extending into the left subclavian artery and aortic arch ending in his proximal descending thoracic aorta.  Evaluated by vascular surgery and plans on continued monitoring  He does report intermittent episodes of left-sided chest pain and clamminess lasting 5 to 7 minutes that does not radiate or cause shortness of breath or dizziness.  Is not associated with increased exertion and will feel better after massaging the pectoralis muscle.  Denies neck pain or arm pain or weakness.  He does report chronic bilateral shoulder pain.  He denies having this further evaluated.     History provided for reference purposes only Initial visit 06/21/2019 Dr. Pearlean Brownie: Erik Parker is a 54 year old male seen today for initial office follow-up visit following hospital consultation for strokes. History is obtained from his patient and his wife as well as review of electronic medical records and I personally reviewed imaging films in PACS . He is a 54 year old male with history of hypertension, hyperlipidemia and obesity who presented to the emergency room on 05/01/2019 with sudden onset of facial droop and speech difficulties and dysarthria and right arm weakness.  He was seen by telemetry neurology and CT scan of the head showed a acute nonhemorrhagic left frontal opercular infarct with NIH stroke scale of 7. CT angiogram showed dissection of the aorta and after confirmatory tests he was taken for emergent urgent repair of Stanford type a dissection involving left carotid bulb and radial graphic string sign involving right common carotid artery. He underwent emergent cardiothoracic surgery for repair. CT perfusion was also done which appeared to be unreliable due to the dissection.Marland Kitchen MRI scan of the brain showed scattered areas of acute infarction involving both hemispheres more extensive on the left than the right with the largest area being 3.5 to 4 cm in the left frontal operculum. LDL cholesterol is 88 mg percent hemoglobin A1c was 5.6. He was started on aspirin after his procedure and Mevacor for his lipids but subsequently has been switched to Crestor. He is currently getting outpatient speech therapy in [place] and is able to speak but certain words are not clear. He has trouble controlling his tongue and right lower half of his face. He also has some numbness in that area. He has no physical weakness in his extremities and gait and balance are not affected. His blood pressure is better controlled and it is 133/81 today. Is tolerating Crestor well without muscle aches and pains.  ROS:   14 system review of systems is positive for slurred speech, facial weakness, difficulty talking, chest pain and all other systems negative   PMH:  Past Medical History:  Diagnosis Date  . Hyperlipidemia   . Hypertension   . Stroke [facility])     Social History:  Social History  Socioeconomic History  . Marital status: Single    Spouse name: Not on file  . Number of children: Not on file  . Years of education: Not on file  . Highest education level: Not on file  Occupational History  . Not on file  Tobacco Use  . Smoking status: Former Smoker    Packs/day: 0.50     Years: 9.00    Pack years: 4.50    Types: Cigarettes    Quit date: 05/01/2019    Years since quitting: 0.4  . Smokeless tobacco: Never Used  Vaping Use  . Vaping Use: Never used  Substance and Sexual Activity  . Alcohol use: Yes    Alcohol/week: 1.0 standard drink    Types: 1 Cans of beer per week    Comment: occ  . Drug use: No  . Sexual activity: Yes    Partners: Female    Birth control/protection: Pill  Other Topics Concern  . Not on file  Social History Narrative  . Not on file   Social Determinants of Health   Financial Resource Strain:   . Difficulty of Paying Living Expenses:   Food Insecurity:   . Worried About Programme researcher, broadcasting/film/video in the Last Year:   . Barista in the Last Year:   Transportation Needs:   . Freight forwarder (Medical):   Marland Kitchen Lack of Transportation (Non-Medical):   Physical Activity:   . Days of Exercise per Week:   . Minutes of Exercise per Session:   Stress:   . Feeling of Stress :   Social Connections:   . Frequency of Communication with Friends and Family:   . Frequency of Social Gatherings with Friends and Family:   . Attends Religious Services:   . Active Member of Clubs or Organizations:   . Attends Banker Meetings:   Marland Kitchen Marital Status:   Intimate Partner Violence:   . Fear of Current or Ex-Partner:   . Emotionally Abused:   Marland Kitchen Physically Abused:   . Sexually Abused:     Medications:   Current Outpatient Medications on File Prior to Visit  Medication Sig Dispense Refill  . amantadine (SYMMETREL) 100 MG capsule Take 2 capsules (200 mg total) by mouth daily. For aphasia- 60 capsule 9  . amLODipine (NORVASC) 10 MG tablet amlodipine 10 mg tablet 90 tablet 3  . aspirin EC 325 MG EC tablet Take 1 tablet (325 mg total) by mouth daily. 30 tablet 0  . folic acid (FOLVITE) 1 MG tablet Take 1 tablet (1 mg total) by mouth daily. 30 tablet 0  . hydrochlorothiazide (HYDRODIURIL) 25 MG tablet Take 25 mg by mouth daily.     Marland Kitchen lovastatin (MEVACOR) 40 MG tablet Take 40 mg by mouth daily.    . metoprolol tartrate (LOPRESSOR) 25 MG tablet Take 1 tablet (25 mg total) by mouth 2 (two) times daily. 180 tablet 3  . rosuvastatin (CRESTOR) 20 MG tablet Take 1 tablet (20 mg total) by mouth daily at 6 PM. 90 tablet 3   No current facility-administered medications on file prior to visit.    Allergies:  No Known Allergies   Today's Vitals   09/27/19 0745  BP: (!) 129/81  Pulse: 62  Weight: (!) 259 lb (117.5 kg)  Height: 6' (1.829 m)   Body mass index is 35.13 kg/m.    Physical Exam General: Obese pleasant middle-aged male, seated, in no evident distress Head: head normocephalic and atraumatic.  Neck:  supple with no carotid or supraclavicular bruits Cardiovascular: regular rate and rhythm, no murmurs Musculoskeletal: no deformity Skin:  no rash/petichiae Vascular:  Normal pulses all extremities  Neurologic Exam Mental Status: Awake and fully alert.  Mild to moderate dysarthric speech.  Oriented to place and time. Recent and remote memory intact. Attention span, concentration and fund of knowledge appropriate. Mood and affect appropriate.  Cranial Nerves: Pupils equal, briskly reactive to light. Extraocular movements full without nystagmus. Visual fields full to confrontation. Hearing intact. Facial sensation intact. Right lower facial weakness., tongue, palate moves normally and symmetrically.  Motor: Normal bulk and tone. Normal strength in all tested extremity muscles. Sensory.: intact to touch ,pinprick .position and vibratory sensation.  Coordination: Rapid alternating movements normal in all extremities. Finger-to-nose and heel-to-shin performed accurately bilaterally. Gait and Station: Arises from chair without difficulty. Stance is normal. Gait demonstrates normal stride length and balance . Able to heel, toe and tandem walk without difficulty.  Reflexes: 1+ and symmetric. Toes downgoing.      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 Vascular risk factors of obesity, hypertension and hyperlipidemia     BICEREBRAL EMBOLIC STROKE -Residual deficits: right facial weakness, dysarthria and apraxia -ongoing improvement.  Encouraged continued HEP as recommended during therapy sessions -Continue aspirin 325 mg daily and Crestor 20 mg daily for secondary stroke prevention -Ensure close PCP follow-up for aggressive stroke risk factor management -BP goal<130/90 and LDL goal<70  AORTIC DISSECTION -CT 06/25/2019: Dissection extending into the left subclavian artery and aortic arch ending proximal descending thoracic aorta -Continue to follow with cardiothoracic and vascular surgery for surveillance monitoring  LEFT SIDED CHEST PAIN -Pain is not new and denies worsening -Appears musculoskeletal related as symptoms do not radiate and improve with massage and denies shortness of breath or dizziness -Advised to speak further with PCP for further evaluation; due to clamminess complaint, may need further evaluation by cardiology but this decision will be deferred to PCP -Potentially related to aortic dissection reports pain occurs on the left upper corner of pectoralis muscle -may need to further discuss with cardiothoracic or vascular surgery   Follow-up in 6 months or call earlier if needed    I spent 30 minutes of face-to-face and non-face-to-face time with patient.  This included previsit chart review, lab review, study review, order entry, electronic health record documentation, patient education regarding history of embolic stroke, residual deficits, aortic dissection, left-sided chest symptoms and answered all questions to patient satisfaction   Ihor Austin, [facility]  [facility] [address] [place], [place] [address]  Phone [phone] Fax [phone] Note: This document was prepared with digital dictation and possible smart phrase technology. Any transcriptional errors that result from this process are unintentional.

## 2019-11-08 ENCOUNTER — Encounter: Payer: Self-pay | Admitting: Cardiology

## 2019-11-21 LAB — COLOGUARD: COLOGUARD: NEGATIVE

## 2019-11-21 LAB — EXTERNAL GENERIC LAB PROCEDURE: COLOGUARD: NEGATIVE

## 2019-12-27 ENCOUNTER — Telehealth (INDEPENDENT_AMBULATORY_CARE_PROVIDER_SITE_OTHER): Payer: Managed Care, Other (non HMO) | Admitting: Cardiology

## 2019-12-27 ENCOUNTER — Encounter: Payer: Self-pay | Admitting: Cardiology

## 2019-12-27 ENCOUNTER — Encounter: Payer: Self-pay | Admitting: *Deleted

## 2019-12-27 VITALS — Ht 73.0 in | Wt 250.0 lb

## 2019-12-27 DIAGNOSIS — I4729 Other ventricular tachycardia: Secondary | ICD-10-CM

## 2019-12-27 DIAGNOSIS — I472 Ventricular tachycardia: Secondary | ICD-10-CM | POA: Diagnosis not present

## 2019-12-27 DIAGNOSIS — I71019 Dissection of thoracic aorta, unspecified: Secondary | ICD-10-CM

## 2019-12-27 DIAGNOSIS — I10 Essential (primary) hypertension: Secondary | ICD-10-CM | POA: Diagnosis not present

## 2019-12-27 DIAGNOSIS — I7101 Dissection of thoracic aorta: Secondary | ICD-10-CM

## 2019-12-27 NOTE — Patient Instructions (Signed)

## 2019-12-27 NOTE — Progress Notes (Signed)
Virtual Visit via Telephone Note   This visit type was conducted due to national recommendations for restrictions regarding the COVID-19 Pandemic (e.g. social distancing) in an effort to limit this patient's exposure and mitigate transmission in our community.  Due to his co-morbid illnesses, this patient is at least at moderate risk for complications without adequate follow up.  This format is felt to be most appropriate for this patient at this time.  The patient did not have access to video technology/had technical difficulties with video requiring transitioning to audio format only (telephone).  All issues noted in this document were discussed and addressed.  No physical exam could be performed with this format.  Please refer to the patient's chart for his  consent to telehealth for [facility].    Date:  12/27/2019   ID:  Erik Parker, DOB 1965/12/29, MRN [id number] The patient was identified using 2 identifiers.  Patient Location: Home Provider Location: Office/Clinic  PCP:  Shawnie Dapper, PA-C  Cardiologist:  Dina Rich, MD  Electrophysiologist:  None   Evaluation Performed:  Follow-Up Visit  Chief Complaint:  Follow up  History of Present Illness:    Erik Parker is a 54 y.o. male seen today for follow up of the following medical problems.   1. Nonsustaned VT - admission 04/2019 with CVA and acute dissection. Postop had run of monomorphic VT - 04/2019 LVEF 45-50% - 05/2019 Echo LVEF 50%, mild to mod RV dysfunction - resolved with beta blocker, optimizing electrolytes  No recent palpitations.  - compliant with meds  2. Acute ascending aortic dissection - incidental findings, patient presented initially with acute stroke symptoms. Ascending dissection that extended into both carotids - s/p repair 05/01/19 with 28 mm Dacron graft - followed by Dr Vickey Sages as outpatient, plans for repeat CTA  3. Postoperative pericarditis - completed course of colchicine - no  recurrent symptoms.    4. 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 Ascending dissection that extended into both carotids - on follow imaging after repair stable carotid dissections, lumen of CCA improved - followed by neuro Dr Lanier Clam - neuro has recommedned ASA 325  5. HTN - he is compliant with meds  6. Hyperlipidemia - 04/2019 TC 148 TG 112 HDL 38 LDL 88 -  Labs followed by pcp - crestor has fallen off his medication list. He reports pcp stopped his statin for muscle aches, symptoms imprved. Started on CoQ10.   7. Chest pain - atypical symptoms previously, better with rubbing area. Has resolved without recent recurrence.    Has had covid vaccine, moderna.   The patient does not have symptoms concerning for COVID-19 infection (fever, chills, cough, or new shortness of breath).    Past Medical History:  Diagnosis Date  . Hyperlipidemia   . Hypertension   . Stroke [facility])    Past Surgical History:  Procedure Laterality Date  . ACHILLES TENDON SURGERY Right 11/10/2016   Procedure: ACHILLES TENDON REPAIR VIA SPEED BRIDGE;  Surgeon: [name], DPM;  Location: [facility];  Service: Podiatry;  Laterality: Right;  . HEEL SPUR RESECTION Right 11/10/2016   Procedure: HEEL SPUR RESECTION (HAGLUNDS DEFORMITY);  Surgeon: [name], DPM;  Location: [facility];  Service: Podiatry;  Laterality: Right;  . REPAIR OF ACUTE ASCENDING THORACIC AORTIC DISSECTION N/A 05/01/2019   Procedure: REPAIR OF ACUTE ASCENDING THORACIC AORTIC DISSECTION USING HEMOSHIELD GRAFT SIZE ;  Surgeon: Linden Dolin, MD;  [facility]  OR;  Service: Cardiothoracic;  Laterality: N/A;     No outpatient medications have been marked as taking for the 12/27/19 encounter (Appointment) with Antoine Poche, MD.     Allergies:   Patient has no known allergies.   Social History   Tobacco Use  . Smoking status: Former Smoker     Packs/day: 0.50    Years: 9.00    Pack years: 4.50    Types: Cigarettes    Quit date: 05/01/2019    Years since quitting: 0.6  . Smokeless tobacco: Never Used  Vaping Use  . Vaping Use: Never used  Substance Use Topics  . Alcohol use: Yes    Alcohol/week: 1.0 standard drink    Types: 1 Cans of beer per week    Comment: occ  . Drug use: No     Family Hx: The patient's family history includes Hypertension in his father and mother.  ROS:   Please see the history of present illness.     All other systems reviewed and are negative.   Prior CV studies:   The following studies were reviewed today:    Labs/Other Tests and Data Reviewed:    EKG:  No ECG reviewed.  Recent Labs: 04/30/2019: B Natriuretic Peptide 31.0 05/10/2019: ALT 51 05/28/2019: BUN 11; Creatinine, Ser 0.72; Hemoglobin 13.0; Magnesium 2.0; Platelets 450; Potassium 4.2; Sodium 137   Recent Lipid Panel Lab Results  Component Value Date/Time   CHOL 148 05/04/2019 06:21 AM   TRIG 130 05/04/2019 05:11 PM   HDL 38 (L) 05/04/2019 06:21 AM   CHOLHDL 3.9 05/04/2019 06:21 AM   LDLCALC 88 05/04/2019 06:21 AM    Wt Readings from Last 3 Encounters:  09/27/19 (!) 259 lb (117.5 kg)  09/11/19 258 lb (117 kg)  07/20/19 257 lb (116.6 kg)     Risk Assessment/Calculations:      Objective:    Vital Signs:  There were no vitals taken for this visit.  Normal affect. Normal speech pattern and tone. Comfortable, no apparent distress. No audible signs of sob or wheezing.   ASSESSMENT & PLAN:    1. Monomorphic VT/NSVT - isolated episode of NSVT postop aortic dissection repair - no recurrences during admission - no symptoms, continue beta blocker.   2. Aortic dissection s/p repair - followed closely by CT surgery - continue beta blocker, bp control  3. CVA - in setting of aortic dissection involving bilateral carotids - followed by neuro - they have recommended high dose ASA. He is on statin   4. HTN -  has been at goal at most recent clinical visits, continue current meds  COVID-19 Education: The signs and symptoms of COVID-19 were discussed with the patient and how to seek care for testing (follow up with PCP or arrange E-visit).  The importance of social distancing was discussed today.  Time:   Today, I have spent 17 minutes with the patient with telehealth technology discussing the above problems.     Medication Adjustments/Labs and Tests Ordered: Current medicines are reviewed at length with the patient today.  Concerns regarding medicines are outlined above.   Tests Ordered: No orders of the defined types were placed in this encounter.   Medication Changes: No orders of the defined types were placed in this encounter.   Follow Up:  In Person in 6 month(s)  Signed, Dina Rich, MD  12/27/2019 8:01 AM    [facility]

## 2020-02-04 ENCOUNTER — Encounter: Payer: 59 | Attending: Physical Medicine and Rehabilitation | Admitting: Physical Medicine and Rehabilitation

## 2020-02-04 ENCOUNTER — Other Ambulatory Visit: Payer: Self-pay

## 2020-02-04 ENCOUNTER — Encounter: Payer: Self-pay | Admitting: Physical Medicine and Rehabilitation

## 2020-02-04 VITALS — BP 143/82 | HR 58 | Temp 98.1°F | Ht 72.0 in | Wt 257.0 lb

## 2020-02-04 DIAGNOSIS — R29898 Other symptoms and signs involving the musculoskeletal system: Secondary | ICD-10-CM

## 2020-02-04 DIAGNOSIS — I69320 Aphasia following cerebral infarction: Secondary | ICD-10-CM

## 2020-02-04 MED ORDER — AMANTADINE HCL 100 MG PO CAPS
300.0000 mg | ORAL_CAPSULE | Freq: Every day | ORAL | 3 refills | Status: DC
Start: 2020-02-04 — End: 2021-09-30

## 2020-02-04 NOTE — Patient Instructions (Signed)
Pt is a 54 yr old male s/p aortic dissection and L opercular infarction here for f/u. Sternal precautions completed- Stroke was 05/01/19 Has aphasia as main Sx's.      1. Can try to wean off Amantadine at 1 YEAR. So that means beginning of March 2022- can try to wean off- 1 pills/day x 1 week, then stop- but I will plan on prescribing lifetime.   2.  Try to come up with synonym to express your speech, that might help.  - can also try singing- sing song voice- can sometimes help- it's a different part of brain.   3. We discussed it- will try Amantadine-can either do  300 mg daily- or can break up 200 mg in AM and 100 mg at lunch. Has no kidney/liver issues.   4. F/U in in March 2022-

## 2020-02-04 NOTE — Progress Notes (Signed)
Subjective:    Patient ID: Erik Parker, male    DOB: Oct 28, 1965, 54 y.o.   MRN: [id number]  HPI Pt is a 54 yr old male s/p aortic dissection and L opercular infarction here for f/u. Sternal precautions completed- Stroke was 05/01/19 Has aphasia as main Sx's.    Came off Amantadine but speech got worse, so went back on.    1 more visit- with PT- done with SLP- 4 months ago.   Went back to Work in June 2021-  Doing OK with work-  Does a lot of talking for job   Knows what wants to say- can't get it out- knows it but mouth won't say it.   Pain Inventory Average Pain 0 Pain Right Now 0 My pain is none  LOCATION OF PAIN  none  BOWEL Number of stools per week: 7 Oral laxative use No  Type of laxative none Enema or suppository use No  History of colostomy No  Incontinent No   BLADDER Normal In and out cath, frequency n/a Able to self cath n/a Bladder incontinence No  Frequent urination No  Leakage with coughing No  Difficulty starting stream No  Incomplete bladder emptying No    Mobility walk without assistance ability to climb steps?  yes do you drive?  yes Do you have any goals in this area?  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  no  Physicians involved in your care Any changes since last visit?  no   Family History  Problem Relation Age of Onset  . Hypertension Mother   . Hypertension Father    Social History   Socioeconomic History  . Marital status: Single    Spouse name: Not on file  . Number of children: Not on file  . Years of education: Not on file  . Highest education level: Not on file  Occupational History  . Not on file  Tobacco Use  . Smoking status: Former Smoker    Packs/day: 0.50    Years: 9.00    Pack years: 4.50    Types: Cigarettes    Quit date: 05/01/2019    Years since quitting: 0.7  . Smokeless  tobacco: Never Used  Vaping Use  . Vaping Use: Never used  Substance and Sexual Activity  . Alcohol use: Yes    Alcohol/week: 1.0 standard drink    Types: 1 Cans of beer per week    Comment: occ  . Drug use: No  . Sexual activity: Yes    Partners: Female    Birth control/protection: Pill  Other Topics Concern  . Not on file  Social History Narrative  . Not on file   Social Determinants of Health   Financial Resource Strain:   . Difficulty of Paying Living Expenses: Not on file  Food Insecurity:   . Worried About Programme researcher, broadcasting/film/video in the Last Year: Not on file  . Ran Out of Food in the Last Year: Not on file  Transportation Needs:   . Lack of Transportation (Medical): Not on file  . Lack of Transportation (Non-Medical): Not on file  Physical Activity:   . Days of Exercise per Week: Not on file  . Minutes of Exercise per Session: Not on file  Stress:   . Feeling of Stress : Not on file  Social Connections:   . Frequency  of Communication with Friends and Family: Not on file  . Frequency of Social Gatherings with Friends and Family: Not on file  . Attends Religious Services: Not on file  . Active Member of Clubs or Organizations: Not on file  . Attends Banker Meetings: Not on file  . Marital Status: Not on file   Past Surgical History:  Procedure Laterality Date  . ACHILLES TENDON SURGERY Right 11/10/2016   Procedure: ACHILLES TENDON REPAIR VIA SPEED BRIDGE;  Surgeon: [name], DPM;  Location: [facility];  Service: Podiatry;  Laterality: Right;  . HEEL SPUR RESECTION Right 11/10/2016   Procedure: HEEL SPUR RESECTION (HAGLUNDS DEFORMITY);  Surgeon: [name], DPM;  Location: [facility];  Service: Podiatry;  Laterality: Right;  . REPAIR OF ACUTE ASCENDING THORACIC AORTIC DISSECTION N/A 05/01/2019   Procedure: REPAIR OF ACUTE ASCENDING THORACIC AORTIC DISSECTION USING HEMOSHIELD GRAFT SIZE ;  Surgeon: Linden Dolin, MD;  Location[facility] OR;   Service: Cardiothoracic;  Laterality: N/A;   Past Medical History:  Diagnosis Date  . Hyperlipidemia   . Hypertension   . Stroke (HCC)    Temp 98.1 F (36.7 C)   Ht 6' (1.829 m)   Wt 257 lb (116.6 kg)   BMI 34.86 kg/m   Opioid Risk Score:   Fall Risk Score:  `1  Depression screen PHQ 2/9  No flowsheet data found.  Review of Systems  Constitutional: Negative.   HENT: Negative.   Eyes: Negative.   Respiratory: Negative.   Cardiovascular: Negative.   Gastrointestinal: Negative.   Endocrine: Negative.   Genitourinary: Negative.   Musculoskeletal: Negative.   Skin: Negative.   Allergic/Immunologic: Negative.   Neurological: Negative.   Hematological: Negative.   Psychiatric/Behavioral: Negative.   All other systems reviewed and are negative.      Objective:   Physical Exam  Awake, alert, appropriate, accompanied by wife, NAD Can't make a full fist on R side anymore.  Speech is more dysarthric than aphasic now Slightly slurred-  No increased tone- but has 1-2 beats clonus R wrist and harder/can't make real fist on R    Assessment & Plan:    Pt is a 54 yr old male s/p aortic dissection and L opercular infarction here for f/u. Sternal precautions completed- Stroke was 05/01/19 Has aphasia as main Sx's.      1. Can try to wean off Amantadine at 1 YEAR. So that means beginning of March 2022- can try to wean off- 1 pills/day x 1 week, then stop- but I will plan on prescribing lifetime.   2.  Try to come up with synonym to express your speech, that might help.  - can also try singing- sing song voice- can sometimes help- it's a different part of brain.   3. We discussed it- will try Amantadine-can either do  300 mg daily- or can break up 200 mg in AM and 100 mg at lunch. Has no kidney/liver issues.   4. F/U in in March 2022-   I spent a total of 25 minutes on visit- as detailed above.

## 2020-03-06 DIAGNOSIS — R200 Anesthesia of skin: Secondary | ICD-10-CM | POA: Diagnosis not present

## 2020-03-06 DIAGNOSIS — Z6834 Body mass index (BMI) 34.0-34.9, adult: Secondary | ICD-10-CM | POA: Diagnosis not present

## 2020-03-06 DIAGNOSIS — Z0000 Encounter for general adult medical examination without abnormal findings: Secondary | ICD-10-CM | POA: Diagnosis not present

## 2020-03-06 DIAGNOSIS — Z1331 Encounter for screening for depression: Secondary | ICD-10-CM | POA: Diagnosis not present

## 2020-03-06 DIAGNOSIS — E6609 Other obesity due to excess calories: Secondary | ICD-10-CM | POA: Diagnosis not present

## 2020-04-02 ENCOUNTER — Ambulatory Visit (INDEPENDENT_AMBULATORY_CARE_PROVIDER_SITE_OTHER): Payer: BC Managed Care – PPO | Admitting: Adult Health

## 2020-04-02 ENCOUNTER — Encounter: Payer: Self-pay | Admitting: Adult Health

## 2020-04-02 VITALS — BP 119/74 | HR 54 | Ht 72.0 in | Wt 253.0 lb

## 2020-04-02 DIAGNOSIS — I7101 Dissection of thoracic aorta: Secondary | ICD-10-CM | POA: Diagnosis not present

## 2020-04-02 DIAGNOSIS — I63413 Cerebral infarction due to embolism of bilateral middle cerebral arteries: Secondary | ICD-10-CM | POA: Diagnosis not present

## 2020-04-02 DIAGNOSIS — I71019 Dissection of thoracic aorta, unspecified: Secondary | ICD-10-CM

## 2020-04-02 NOTE — Progress Notes (Signed)
[facility] [address] [place]. [place] [address] [phone]       OFFICE FOLLOW-UP NOTE  [name] Date of Birth:  [birth year] Medical Record Number:  [id number]   Reason for visit: stroke f/u  Chief complaint: Chief Complaint  Patient presents with  . Follow-up    Tx rm, with wife, states he is doing well       HPI:   Today, 04/02/2020, [name] returns for 27-month stroke follow-up accompanied by his wife. Stable from stroke standpoint without new or worsening stroke/TIA symptoms and reports residual aphasia which has been gradually improving. Remains on aspirin 325 mg daily without bleeding or bruising.  He was previously having difficulty tolerating Crestor therefore discontinued by PCP and initiated coq.10 with resolution of symptoms.  Recently restarted on Crestor 20 mg daily and has not been having any difficulties tolerating at this time. Blood pressure today 119/74.  No concerns at this time.   History provided for reference purposes only Update 09/27/2019 JM: Mr. Bann returns for stroke follow-up. Residual deficits of left facial weakness, dysarthria and apraxia-improving with completion of therapy Return to full-time work with 12-hour shifts without difficulty Continues on aspirin 325 mg daily and Crestor for secondary stroke prevention while side effects Blood pressure stable 129/81 Follow-up with cardiothoracic with repeat CT scan 06/25/2019 showed dissection extending into the left subclavian artery and aortic arch ending in his proximal descending thoracic aorta.  Evaluated by vascular surgery and plans on continued monitoring He does report intermittent episodes of left-sided chest pain and clamminess lasting 5 to 7 minutes that does not radiate or cause shortness of breath or dizziness.  Is not associated with increased exertion and will feel better after massaging the pectoralis muscle.  Denies neck pain or arm pain or weakness.  He does  report chronic bilateral shoulder pain.  He denies having this further evaluated.  Initial visit 06/21/2019 Dr. Pearlean Brownie: [name] is a 55 year old male seen today for initial office follow-up visit following hospital consultation for strokes. History is obtained from his patient and his wife as well as review of electronic medical records and I personally reviewed imaging films in PACS . He is a 55 year old male with history of hypertension, hyperlipidemia and obesity who presented to the emergency room on 05/01/2019 with sudden onset of facial droop and speech difficulties and dysarthria and right arm weakness. He was seen by telemetry neurology and CT scan of the head showed a acute nonhemorrhagic left frontal opercular infarct with NIH stroke scale of 7. CT angiogram showed dissection of the aorta and after confirmatory tests he was taken for emergent urgent repair of [place] type a dissection involving left carotid bulb and radial graphic string sign involving right common carotid artery. He underwent emergent cardiothoracic surgery for repair. CT perfusion was also done which appeared to be unreliable due to the dissection.Marland Kitchen MRI scan of the brain showed scattered areas of acute infarction involving both hemispheres more extensive on the left than the right with the largest area being 3.5 to 4 cm in the left frontal operculum. LDL cholesterol is 88 mg percent hemoglobin A1c was 5.6. He was started on aspirin after his procedure and Mevacor for his lipids but subsequently has been switched to Crestor. He is currently getting outpatient speech therapy in [place] and is able to speak but certain words are not clear. He has trouble controlling his tongue and right lower half of his face. He also has some numbness  in that area. He has no physical weakness in his extremities and gait and balance are not affected. His blood pressure is better controlled and it is 133/81 today. Is tolerating Crestor well without  muscle aches and pains.  ROS:   14 system review of systems is positive for those listed in HPI and all other systems negative   PMH:  Past Medical History:  Diagnosis Date  . Hyperlipidemia   . Hypertension   . Stroke [facility])     Social History:  Social History   Socioeconomic History  . Marital status: Single    Spouse name: Not on file  . Number of children: Not on file  . Years of education: Not on file  . Highest education level: Not on file  Occupational History  . Not on file  Tobacco Use  . Smoking status: Former Smoker    Packs/day: 0.50    Years: 9.00    Pack years: 4.50    Types: Cigarettes    Quit date: 05/01/2019    Years since quitting: 0.9  . Smokeless tobacco: Never Used  Vaping Use  . Vaping Use: Never used  Substance and Sexual Activity  . Alcohol use: Yes    Alcohol/week: 1.0 standard drink    Types: 1 Cans of beer per week    Comment: occ  . Drug use: No  . Sexual activity: Yes    Partners: Female    Birth control/protection: Pill  Other Topics Concern  . Not on file  Social History Narrative  . Not on file   Social Determinants of Health   Financial Resource Strain: Not on file  Food Insecurity: Not on file  Transportation Needs: Not on file  Physical Activity: Not on file  Stress: Not on file  Social Connections: Not on file  Intimate Partner Violence: Not on file    Medications:   Current Outpatient Medications on File Prior to Visit  Medication Sig Dispense Refill  . amantadine (SYMMETREL) 100 MG capsule Take 3 capsules (300 mg total) by mouth daily. For aphasia- 270 capsule 3  . amLODipine (NORVASC) 10 MG tablet amlodipine 10 mg tablet 90 tablet 3  . aspirin EC 325 MG EC tablet Take 1 tablet (325 mg total) by mouth daily. 30 tablet 0  . Coenzyme Q10 (CO Q 10) 100 MG CAPS Take by mouth.    . folic acid (FOLVITE) 1 MG tablet Take 1 tablet (1 mg total) by mouth daily. 30 tablet 0  . metoprolol tartrate (LOPRESSOR) 25 MG tablet  Take 1 tablet (25 mg total) by mouth 2 (two) times daily. 180 tablet 3  . rosuvastatin (CRESTOR) 20 MG tablet Take 20 mg by mouth at bedtime.     No current facility-administered medications on file prior to visit.    Allergies:  No Known Allergies   Today's Vitals   04/02/20 0751  BP: 119/74  Pulse: (!) 54  Weight: 253 lb (114.8 kg)  Height: 6' (1.829 m)   Body mass index is 34.31 kg/m.  Physical Exam General: Obese pleasant middle-aged male, seated, in no evident distress Head: head normocephalic and atraumatic.  Neck: supple with no carotid or supraclavicular bruits Cardiovascular: regular rate and rhythm, no murmurs Musculoskeletal: no deformity Skin:  no rash/petichiae Vascular:  Normal pulses all extremities  Neurologic Exam Mental Status: Awake and fully alert.  Mild aphasia and dysarthria.  Oriented to place and time. Recent and remote memory intact. Attention span, concentration and fund of knowledge  appropriate. Mood and affect appropriate.  Cranial Nerves: Pupils equal, briskly reactive to light. Extraocular movements full without nystagmus. Visual fields full to confrontation. Hearing intact. Facial sensation intact. Right lower facial weakness., tongue, palate moves normally and symmetrically.  Motor: Normal bulk and tone. Normal strength in all tested extremity muscles except slightly decreased right grip strength. Sensory.: intact to touch ,pinprick .position and vibratory sensation.  Coordination: Rapid alternating movements normal in all extremities. Finger-to-nose and heel-to-shin performed accurately bilaterally. Gait and Station: Arises from chair without difficulty. Stance is normal. Gait demonstrates normal stride length and balance . Able to heel, toe and tandem walk without difficulty.  Reflexes: 1+ and symmetric. Toes downgoing.     ASSESSMENT/PLAN: 55 year old gentleman with bicerebral embolic strokes from aortic dissection in March 2021 status post  surgical repair.  Vascular risk factors of obesity, hypertension and hyperlipidemia     BICEREBRAL EMBOLIC STROKE -Residual deficits: Mild dysarthria and aphasia -slowly improving per patient and family report -Continue aspirin 325 mg daily and Crestor 20 mg daily for secondary stroke prevention -Ensure close PCP follow-up for aggressive stroke risk factor management -BP goal<130/90 and LDL goal<70  AORTIC DISSECTION -CT 06/25/2019: Dissection extending into the left subclavian artery and aortic arch ending proximal descending thoracic aorta -Continue to follow with cardiothoracic and vascular surgery for surveillance monitoring with plans on repeating imaging around 06/2020    Overall stable from stroke standpoint routinely followed by PCP for secondary stroke prevention measures therefore recommend follow-up on an as-needed basis   CC:  [facility] provider: Dr. Lajuan Lines, Yetta Glassman, PA-C    I spent 30 minutes of face-to-face and non-face-to-face time with patient and wife.  This included previsit chart review, lab review, study review, order entry, electronic health record documentation, patient education regarding history of embolic stroke, residual deficits, aortic dissection and follow-up with cardiothoracic and vascular surgery, importance of managing stroke risk factors and answered all questions to patient and wife's satisfaction   Ihor Austin, AGNP-BC  [facility] [address] [place], [place] [address]  Phone [phone] Fax [phone] Note: This document was prepared with digital dictation and possible smart phrase technology. Any transcriptional errors that result from this process are unintentional.

## 2020-04-02 NOTE — Patient Instructions (Signed)
Continue aspirin 325 mg daily and Crestor 20 mg daily for secondary stroke prevention  Continue to follow with cardiology as scheduled  Continue to follow up with PCP regarding cholesterol and blood pressure management  Maintain strict control of hypertension with blood pressure goal below 130/90 and cholesterol with LDL cholesterol (bad cholesterol) goal below 70 mg/dL.    Overall stable from stroke standpoint and recommend follow-up on an as-needed basis    Thank you for coming to see [place] at [facility]. I hope we have been able to provide you high quality care today.  You may receive a patient satisfaction survey over the next few weeks. We would appreciate your feedback and comments so that we may continue to improve ourselves and the health of our patients.

## 2020-04-16 DIAGNOSIS — Z6834 Body mass index (BMI) 34.0-34.9, adult: Secondary | ICD-10-CM | POA: Diagnosis not present

## 2020-04-16 DIAGNOSIS — E7849 Other hyperlipidemia: Secondary | ICD-10-CM | POA: Diagnosis not present

## 2020-04-16 DIAGNOSIS — I69990 Apraxia following unspecified cerebrovascular disease: Secondary | ICD-10-CM | POA: Diagnosis not present

## 2020-04-16 DIAGNOSIS — E6609 Other obesity due to excess calories: Secondary | ICD-10-CM | POA: Diagnosis not present

## 2020-05-02 ENCOUNTER — Other Ambulatory Visit: Payer: Self-pay

## 2020-05-02 ENCOUNTER — Encounter: Payer: Self-pay | Admitting: Physical Medicine and Rehabilitation

## 2020-05-02 ENCOUNTER — Encounter
Payer: BC Managed Care – PPO | Attending: Physical Medicine and Rehabilitation | Admitting: Physical Medicine and Rehabilitation

## 2020-05-02 VITALS — BP 137/79 | HR 56 | Temp 97.7°F | Ht 72.0 in | Wt 255.4 lb

## 2020-05-02 DIAGNOSIS — I69320 Aphasia following cerebral infarction: Secondary | ICD-10-CM | POA: Diagnosis not present

## 2020-05-02 DIAGNOSIS — I63412 Cerebral infarction due to embolism of left middle cerebral artery: Secondary | ICD-10-CM | POA: Diagnosis not present

## 2020-05-02 NOTE — Progress Notes (Signed)
Subjective:    Patient ID: Erik Parker, male    DOB: February 08, 1966, 55 y.o.   MRN: [id number]  HPI    Pt is a 55 yr old male s/p aortic dissection and L opercular infarction here for f/u. Sternal precautionscompleted- Stroke was 05/01/19 Has aphasia as main Sx's.   Took Amantadine this AM.   Takes all 3 pills- so 300 mg in AM.  Was at 2 pills/day.   Starting to notice- voice is back with a full sentence- thinks it's getting better- sees a difference weekly.   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   Didn't try the sing song voice- forgot about it.     Pain Inventory Average Pain 0 Pain Right Now 0 My pain is no pain  In the last 24 hours, has pain interfered with the following? General activity 0 Relation with others 0 Enjoyment of life 0 What TIME of day is your pain at its worst? na Sleep (in general) Fair  Pain is worse with: no pain Pain improves with: no pain Relief from Meds: no pain  Family History  Problem Relation Age of Onset  . Hypertension Mother   . Hypertension Father    Social History   Socioeconomic History  . Marital status: Single    Spouse name: Not on file  . Number of children: Not on file  . Years of education: Not on file  . Highest education level: Not on file  Occupational History  . Not on file  Tobacco Use  . Smoking status: Former Smoker    Packs/day: 0.50    Years: 9.00    Pack years: 4.50    Types: Cigarettes    Quit date: 05/01/2019    Years since quitting: 1.0  . Smokeless tobacco: Never Used  Vaping Use  . Vaping Use: Never used  Substance and Sexual Activity  . Alcohol use: Yes    Alcohol/week: 1.0 standard drink    Types: 1 Cans of beer per week    Comment: occ  . Drug use: No  . Sexual activity: Yes    Partners: Female    Birth control/protection: Pill  Other Topics Concern  . Not on file  Social History Narrative  . Not on  file   Social Determinants of Health   Financial Resource Strain: Not on file  Food Insecurity: Not on file  Transportation Needs: Not on file  Physical Activity: Not on file  Stress: Not on file  Social Connections: Not on file   Past Surgical History:  Procedure Laterality Date  . ACHILLES TENDON SURGERY Right 11/10/2016   Procedure: ACHILLES TENDON REPAIR VIA SPEED BRIDGE;  Surgeon: [name], DPM;  Location: [facility];  Service: Podiatry;  Laterality: Right;  . HEEL SPUR RESECTION Right 11/10/2016   Procedure: HEEL SPUR RESECTION (HAGLUNDS DEFORMITY);  Surgeon: [name], DPM;  Location: [facility];  Service: Podiatry;  Laterality: Right;  . REPAIR OF ACUTE ASCENDING THORACIC AORTIC DISSECTION N/A 05/01/2019   Procedure: REPAIR OF ACUTE ASCENDING THORACIC AORTIC DISSECTION USING HEMOSHIELD GRAFT SIZE ;  Surgeon: Linden Dolin, MD;  Location: [facility] OR;  Service: Cardiothoracic;  Laterality: N/A;   Past Surgical History:  Procedure Laterality Date  . ACHILLES TENDON SURGERY Right 11/10/2016   Procedure: ACHILLES TENDON REPAIR VIA SPEED BRIDGE;  Surgeon: [name], DPM;  Location: [facility];  Service: Podiatry;  Laterality: Right;  . HEEL  SPUR RESECTION Right 11/10/2016   Procedure: HEEL SPUR RESECTION (HAGLUNDS DEFORMITY);  Surgeon: [name], DPM;  Location: [facility];  Service: Podiatry;  Laterality: Right;  . REPAIR OF ACUTE ASCENDING THORACIC AORTIC DISSECTION N/A 05/01/2019   Procedure: REPAIR OF ACUTE ASCENDING THORACIC AORTIC DISSECTION USING HEMOSHIELD GRAFT SIZE ;  Surgeon: Linden Dolin, MD;  Location: [facility] OR;  Service: Cardiothoracic;  Laterality: N/A;   Past Medical History:  Diagnosis Date  . Hyperlipidemia   . Hypertension   . Stroke (HCC)    BP 137/79   Pulse (!) 56   Temp 97.7 F (36.5 C)   Ht 6' (1.829 m)   Wt 255 lb 6.4 oz (115.8 kg)   SpO2 97%   BMI 34.64 kg/m   Opioid Risk Score:   Fall Risk Score:  `1  Depression  screen PHQ 2/9  No flowsheet data found.  Review of Systems     Objective:   Physical Exam Awake, alert, appropriate, accompanied by wife, NAD Occ word halting/almost stuttering, but was able to say 1 full sentence with no stops x1.   Was able to name 20 animals in 1 minute- which was very good, esp with his aphasia.       Assessment & Plan:   Pt is a 55 yr old male s/p aortic dissection and L opercular infarction here for f/u. Sternal precautionscompleted- Stroke was 05/01/19 Has aphasia as main Sx's  1. Discussed at length whether to wean it off now or wait- decided to wait another 6 months before weaning. If in 6 months has plateaued - then will wean off Amantadine- if not, will wait.    2. Focus on Synonyms sing song voice. Also can use a written outline.   3. Aphasia apps-  On phone-  Go into app store- look for aphasia app that's $10-$30-  And USE it! 4-5x/week.   4. Has 1 year supply on Amantadine- so doesn't need refills- did 3 months supply through mail service.    5. F/U 6 months Call me if you need me.   I spent a total of 20 minutes on appointment- as detailed above.

## 2020-05-02 NOTE — Patient Instructions (Signed)
Pt is a 55 yr old male s/p aortic dissection and L opercular infarction here for f/u. Sternal precautionscompleted- Stroke was 05/01/19 Has aphasia as main Sx's  1. Discussed at length whether to wean it off now or wait- decided to wait another 6 months before weaning. If in 6 months has plateaued - then will wean off Amantadine- if not, will wait.    2. Focus on Synonyms sing song voice. Also can use a written outline.   3. Aphasia apps-  On phone-  Go into app store- look for aphasia app that's $10-$30-  And USE it! 4-5x/week.   4. Has 1 year supply on Amantadine- so doesn't need refills- did 3 months supply through mail service.    5. F/U 6 months Call me if you need me.

## 2020-06-03 ENCOUNTER — Other Ambulatory Visit: Payer: Self-pay | Admitting: *Deleted

## 2020-06-03 DIAGNOSIS — Z9889 Other specified postprocedural states: Secondary | ICD-10-CM

## 2020-06-03 DIAGNOSIS — I7101 Dissection of thoracic aorta: Secondary | ICD-10-CM

## 2020-06-03 DIAGNOSIS — I71019 Dissection of thoracic aorta, unspecified: Secondary | ICD-10-CM

## 2020-07-17 ENCOUNTER — Encounter: Payer: BC Managed Care – PPO | Admitting: Cardiothoracic Surgery

## 2020-07-17 ENCOUNTER — Inpatient Hospital Stay: Admission: RE | Admit: 2020-07-17 | Payer: BC Managed Care – PPO | Source: Ambulatory Visit

## 2020-07-30 ENCOUNTER — Other Ambulatory Visit: Payer: Self-pay | Admitting: Cardiology

## 2020-08-07 ENCOUNTER — Ambulatory Visit (INDEPENDENT_AMBULATORY_CARE_PROVIDER_SITE_OTHER): Payer: BC Managed Care – PPO | Admitting: Cardiothoracic Surgery

## 2020-08-07 ENCOUNTER — Ambulatory Visit
Admission: RE | Admit: 2020-08-07 | Discharge: 2020-08-07 | Disposition: A | Discharge status: Home / Self Care | Payer: BC Managed Care – PPO | Source: Ambulatory Visit | Attending: Cardiothoracic Surgery | Admitting: Cardiothoracic Surgery

## 2020-08-07 ENCOUNTER — Other Ambulatory Visit: Payer: Self-pay

## 2020-08-07 VITALS — BP 145/80 | HR 60 | Resp 20 | Ht 72.0 in | Wt 254.0 lb

## 2020-08-07 DIAGNOSIS — I71019 Dissection of thoracic aorta, unspecified: Secondary | ICD-10-CM

## 2020-08-07 DIAGNOSIS — I7101 Dissection of thoracic aorta: Secondary | ICD-10-CM | POA: Diagnosis not present

## 2020-08-07 DIAGNOSIS — I77810 Thoracic aortic ectasia: Secondary | ICD-10-CM | POA: Diagnosis not present

## 2020-08-07 DIAGNOSIS — Z9889 Other specified postprocedural states: Secondary | ICD-10-CM

## 2020-08-07 MED ORDER — IOPAMIDOL (ISOVUE-370) INJECTION 76%
75.0000 mL | Freq: Once | INTRAVENOUS | Status: AC | PRN
  Administered 2020-08-07: 10:00:00 75 mL via INTRAVENOUS

## 2020-08-07 NOTE — Progress Notes (Signed)
  Chief complaint: Status post repair of aortic dissection  History of present illness: 55 year old man presents for annual visit status post repair of type B dissection.  He is being followed up for a residual type B component at the proximal aspect of the descending aorta.  He has been working through a speech deficit incurred as a result of stroke which resulted from his dissection.  This is improving.  He is also being treated for hypertension.  Otherwise he has no complaints.  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/01/2019   Cerebral embolism with cerebral infarction 05/03/2019   Acute CVA (cerebrovascular accident) [facility])    Essential hypertension    Dyslipidemia    Tobacco abuse    Tachypnea    Leukocytosis    History of open heart surgery    Pericarditis    Aphasia as late effect of cerebrovascular accident (CVA) 05/10/2019   Dysphagia 05/10/2019   Monomorphic ventricular tachycardia (HCC) 05/22/2019   Resolved Ambulatory Problems    Diagnosis Date Noted   No Resolved Ambulatory Problems   Past Medical History:  Diagnosis Date   Hyperlipidemia    Hypertension    Stroke [facility])      Current Outpatient Medications on File Prior to Visit  Medication Sig Dispense Refill   amantadine (SYMMETREL) 100 MG capsule Take 3 capsules (300 mg total) by mouth daily. For aphasia- 270 capsule 3   amLODipine (NORVASC) 10 MG tablet amlodipine 10 mg tablet 90 tablet 3   aspirin EC 325 MG EC tablet Take 1 tablet (325 mg total) by mouth daily. 30 tablet 0   Coenzyme Q10 (CO Q 10) 100 MG CAPS Take by mouth.     ezetimibe (ZETIA) 10 MG tablet Take 10 mg by mouth daily.     folic acid (FOLVITE) 1 MG tablet Take 1 tablet (1 mg total) by mouth daily. 30 tablet 0   metoprolol tartrate (LOPRESSOR) 25 MG tablet TAKE 1 TABLET BY MOUTH TWICE A  DAY 90 tablet 0   No current facility-administered medications on file prior to visit.    Physical Exam Constitutional:      Appearance: Normal appearance.  HENT:     Head: Normocephalic.  Eyes:     Pupils: Pupils are equal, round, and reactive to light.  Cardiovascular:     Rate and Rhythm: Normal rate and regular rhythm.  Pulmonary:     Effort: Pulmonary effort is normal.     Breath sounds: Normal breath sounds.  Musculoskeletal:        General: Normal range of motion.     Cervical back: Normal range of motion.  Neurological:     Mental Status: He is alert.     Cranial Nerves: Cranial nerves are intact.     Sensory: Sensation is intact.     Motor: Motor function is intact.     Comments: Mildly impaired speech pattern    Imaging: I personally reviewed his available imaging from today which demonstrates small residual defect of the proximal descending aorta.  This is stable in appearance.  Impression/plan: Doing well after repair of type a dissection.  Follow-up in 1 year with repeat CT angiogram.  Continue to focus on blood pressure control.  Contina Strain Z. Vickey Sages, MD [phone]

## 2020-08-17 ENCOUNTER — Other Ambulatory Visit: Payer: Self-pay | Admitting: Cardiology

## 2020-09-06 ENCOUNTER — Other Ambulatory Visit: Payer: Self-pay | Admitting: Cardiology

## 2020-09-16 ENCOUNTER — Other Ambulatory Visit: Payer: Self-pay | Admitting: Cardiology

## 2020-09-30 ENCOUNTER — Telehealth: Payer: Self-pay | Admitting: Cardiology

## 2020-09-30 ENCOUNTER — Other Ambulatory Visit: Payer: Self-pay | Admitting: Cardiology

## 2020-09-30 MED ORDER — METOPROLOL TARTRATE 25 MG PO TABS: 25.0000 mg | ORAL_TABLET | Freq: Two times a day (BID) | ORAL | 2 refills | Status: DC

## 2020-09-30 NOTE — Telephone Encounter (Signed)
New message     *STAT* If patient is at the pharmacy, call can be transferred to refill team.   1. Which medications need to be refilled? (please list name of each medication and dose if known) metoprolol tartrate (LOPRESSOR) 25 MG tablet  2. Which pharmacy/location (including street and city if local pharmacy) is medication to be sent to? [facility] [address]   3. Do they need a 30 day or 90 day supply? 90

## 2020-09-30 NOTE — Telephone Encounter (Signed)
Medication refill request for Metoprolol Tartrate 25 mg tablets refilled and [facility] to [facility] per pt request.

## 2020-10-08 ENCOUNTER — Other Ambulatory Visit: Payer: Self-pay | Admitting: Cardiology

## 2020-10-31 ENCOUNTER — Telehealth: Payer: Self-pay | Admitting: Cardiology

## 2020-10-31 MED ORDER — METOPROLOL TARTRATE 25 MG PO TABS: 25.0000 mg | ORAL_TABLET | Freq: Two times a day (BID) | ORAL | 2 refills | Status: DC

## 2020-10-31 NOTE — Telephone Encounter (Signed)
Refill complete and sent to [facility] on [address]

## 2020-10-31 NOTE — Telephone Encounter (Signed)
Pt's wife called stating that the pt is unable to get anymore of his metoprolol tartrate (LOPRESSOR) 25 MG tablet [[id number]] filled at [facility] because after having it filled 2 times in the store he has to go through the mail order pharmacy [facility] and he is completely out of them after tonight.   Could we send a new Rx to [facility] on [address] ?  Please call [name]- [phone]

## 2020-11-07 ENCOUNTER — Encounter
Payer: BC Managed Care – PPO | Attending: Physical Medicine and Rehabilitation | Admitting: Physical Medicine and Rehabilitation

## 2020-11-11 ENCOUNTER — Other Ambulatory Visit: Payer: Self-pay | Admitting: Cardiology

## 2020-12-10 ENCOUNTER — Ambulatory Visit (INDEPENDENT_AMBULATORY_CARE_PROVIDER_SITE_OTHER): Payer: BC Managed Care – PPO | Admitting: Nurse Practitioner

## 2020-12-10 ENCOUNTER — Encounter: Payer: Self-pay | Admitting: Nurse Practitioner

## 2020-12-10 ENCOUNTER — Other Ambulatory Visit: Payer: Self-pay

## 2020-12-10 VITALS — BP 130/80 | HR 57 | Ht 72.0 in | Wt 259.6 lb

## 2020-12-10 DIAGNOSIS — I10 Essential (primary) hypertension: Secondary | ICD-10-CM | POA: Diagnosis not present

## 2020-12-10 DIAGNOSIS — I4729 Other ventricular tachycardia: Secondary | ICD-10-CM

## 2020-12-10 DIAGNOSIS — E782 Mixed hyperlipidemia: Secondary | ICD-10-CM | POA: Diagnosis not present

## 2020-12-10 DIAGNOSIS — Z8673 Personal history of transient ischemic attack (TIA), and cerebral infarction without residual deficits: Secondary | ICD-10-CM | POA: Diagnosis not present

## 2020-12-10 DIAGNOSIS — I7101 Dissection of ascending aorta: Secondary | ICD-10-CM

## 2020-12-10 DIAGNOSIS — I429 Cardiomyopathy, unspecified: Secondary | ICD-10-CM

## 2020-12-10 NOTE — Patient Instructions (Signed)
Medication Instructions:  Your physician recommends that you continue on your current medications as directed. Please refer to the Current Medication list given to you today.  *If you need a refill on your cardiac medications before your next appointment, please call your pharmacy*   Lab Work: None If you have labs (blood work) drawn today and your tests are completely normal, you will receive your results only by: MyChart Message (if you have MyChart) OR A paper copy in the mail If you have any lab test that is abnormal or we need to change your treatment, we will call you to review the results.   Testing/Procedures: None   Follow-Up: At [facility], you and your health needs are our priority.  As part of our continuing mission to provide you with exceptional heart care, we have created designated Provider Care Teams.  These Care Teams include your primary Cardiologist (physician) and Advanced Practice Providers (APPs -  Physician Assistants and Nurse Practitioners) who all work together to provide you with the care you need, when you need it.  We recommend signing up for the patient portal called "MyChart".  Sign up information is provided on this After Visit Summary.  MyChart is used to connect with patients for Virtual Visits (Telemedicine).  Patients are able to view lab/test results, encounter notes, upcoming appointments, etc.  Non-urgent messages can be sent to your provider as well.   To learn more about what you can do with MyChart, go to [email or web address].    Your next appointment:   1 year(s)  The format for your next appointment:   In Person  Provider:   Dina Rich, MD   Other Instructions Check your bp daily and record it.

## 2020-12-10 NOTE — Progress Notes (Signed)
Office Visit    Patient Name: Erik Parker Date of Encounter: 12/10/2020  Primary Care Provider:  Shawnie Dapper, PA-C Primary Cardiologist:  Dina Rich, MD  Chief Complaint    55 year old male with a history of hypertension, hyperlipidemia, tobacco abuse, stroke (left frontal operculum), type A aortic dissection involving the carotid arteries status postrepair (March 2021), postoperative pericarditis and monomorphic ventricular tachycardia, who presents for follow-up related to HTN/NSVT.  Past Medical History    Past Medical History:  Diagnosis Date   Cardiomyopathy (HCC)    a. 04/2019 TEE: EF 50%; b. 04/2019 Echo: EF 45-50%, glob HK, paradoxical septal motion, mild conc LVH, mildly reduced RV fxn, 30mm tube graft seen in Asc Ao w/ intact anastomoses.   Hx of repair of dissecting thoracic aortic aneurysm, Stanford type A    a. 04/2019 s/p repair. Complicated by CVA (bilat carotid dissections) and pericarditis.   Hyperlipidemia    Hypertension    Monomorphic ventricular tachycardia    a. 04/2019 following aortic dissection repair/post-op pericarditis.   Stroke [facility])    Past Surgical History:  Procedure Laterality Date   ACHILLES TENDON SURGERY Right 11/10/2016   Procedure: ACHILLES TENDON REPAIR VIA SPEED BRIDGE;  Surgeon: [name], DPM;  Location: [facility];  Service: Podiatry;  Laterality: Right;   HEEL SPUR RESECTION Right 11/10/2016   Procedure: HEEL SPUR RESECTION (HAGLUNDS DEFORMITY);  Surgeon: [name], DPM;  Location: [facility];  Service: Podiatry;  Laterality: Right;   REPAIR OF ACUTE ASCENDING THORACIC AORTIC DISSECTION N/A 05/01/2019   Procedure: REPAIR OF ACUTE ASCENDING THORACIC AORTIC DISSECTION USING HEMOSHIELD GRAFT SIZE ;  Surgeon: Linden Dolin, MD;  Location: [facility] OR;  Service: Cardiothoracic;  Laterality: N/A;    Allergies  No Known Allergies  History of Present Illness    55 year old male with the above complex past medical  history including hypertension, hyperlipidemia, and tobacco abuse.  In March 2021, he was admitted with a type A aortic dissection involving the bilateral carotid arteries.  He underwent successful repair.  Postoperative course was complicated by pericarditis requiring treatment with colchicine, monomorphic VT (23 beats) requiring initiation of beta-blocker therapy, and embolic stroke.  Intraoperative TEE showed an EF of 50%.  Follow-up transthoracic echocardiogram in March 2021 showed an EF of 45 to 50% without regional wall motion abnormalities.  Erik Parker was last seen in cardiology clinic in October 2021, at which time he was doing well.  He follow-up with CT surgery in June of this year with repeat chest CT showing stable appearance of the ascending thoracic aorta repair.  He has a persistent intimal flap of the distal arch extending into the proximal left subclavian artery.  He will follow-up with thoracic surgery again next year.  He works full-time and remains reasonably active.  He does lift light weights daily before work and otherwise exercises without symptoms or limitations.  He denies chest pain, dyspnea, palpitations, PND, orthopnea, dizziness, syncope, edema, or early satiety.  Home Medications    Current Outpatient Medications  Medication Sig Dispense Refill   amLODipine (NORVASC) 10 MG tablet TAKE 1 TABLET BY MOUTH  DAILY 90 tablet 1   aspirin EC 325 MG EC tablet Take 1 tablet (325 mg total) by mouth daily. 30 tablet 0   Coenzyme Q10 (CO Q 10) 100 MG CAPS Take by mouth.     cyanocobalamin 100 MCG tablet Take 100 mcg by mouth daily.     ezetimibe (ZETIA) 10 MG tablet Take 10  mg by mouth daily.     glucosamine-chondroitin 500-400 MG tablet Take 1 tablet by mouth 3 (three) times daily.     metoprolol tartrate (LOPRESSOR) 25 MG tablet TAKE 1 TABLET BY MOUTH TWICE A DAY 30 tablet 6   amantadine (SYMMETREL) 100 MG capsule Take 3 capsules (300 mg total) by mouth daily. For aphasia- (Patient  not taking: Reported on 12/10/2020) 270 capsule 3   folic acid (FOLVITE) 1 MG tablet Take 1 tablet (1 mg total) by mouth daily. (Patient not taking: Reported on 12/10/2020) 30 tablet 0   No current facility-administered medications for this visit.     Review of Systems    He denies chest pain, palpitations, dyspnea, pnd, orthopnea, n, v, dizziness, syncope, edema, weight gain, or early satiety.  All other systems reviewed and are otherwise negative except as noted above.  Physical Exam    VS:  BP 130/80   Pulse (!) 57   Ht 6' (1.829 m)   Wt 259 lb 9.6 oz (117.8 kg)   SpO2 97%   BMI 35.21 kg/m  , BMI Body mass index is 35.21 kg/m.     GEN: Well nourished, well developed, in no acute distress. HEENT: normal. Neck: Supple, no JVD, carotid bruits, or masses. Cardiac: RRR, no murmurs, rubs, or gallops. No clubbing, cyanosis, edema.  Radials/PT 2+ and equal bilaterally.  Respiratory:  Respirations regular and unlabored, clear to auscultation bilaterally. GI: Soft, nontender, nondistended, BS + x 4. MS: no deformity or atrophy. Skin: warm and dry, no rash. Neuro:  Strength and sensation are intact. Psych: Normal affect.  Accessory Clinical Findings    ECG personally reviewed by me today -sinus bradycardia, 57, right atrial enlargement- no acute changes.  Lab Results  Component Value Date   WBC 10.4 05/28/2019   HGB 13.0 05/28/2019   HCT 41.7 05/28/2019   MCV 88.2 05/28/2019   PLT 450 (H) 05/28/2019   Lab Results  Component Value Date   CREATININE 0.72 05/28/2019   BUN 11 05/28/2019   NA 137 05/28/2019   K 4.2 05/28/2019   CL 102 05/28/2019   CO2 25 05/28/2019   Lab Results  Component Value Date   ALT 51 (H) 05/10/2019   AST 24 05/10/2019   ALKPHOS 73 05/10/2019   BILITOT 0.7 05/10/2019   Lab Results  Component Value Date   CHOL 148 05/04/2019   HDL 38 (L) 05/04/2019   LDLCALC 88 05/04/2019   TRIG 130 05/04/2019   CHOLHDL 3.9 05/04/2019    Lab Results   Component Value Date   HGBA1C 5.6 05/04/2019    Assessment & Plan    1.  Nonsustained monomorphic ventricular tachycardia: This occurred in the postoperative setting following repair of a type a aortic dissection complicated by postoperative stroke and pericarditis.  He denies any palpitations, presyncope, or syncope.  He remains on beta-blocker therapy.  2.  Essential hypertension: Patient on beta-blocker and amlodipine therapy.  He is not checking his blood pressure routinely at home.  Pressure is 130/80 today.  We discussed a goal blood pressure of less than 120 over less than 80.  Prior to making any adjustments, he will begin checking his blood pressure daily at home and will notify us with some numbers in a couple of weeks.  Would consider addition of losartan next.  3.  Postoperative pericarditis: This occurred in March 2021 following aortic dissection repair.  He has no chest pain or dyspnea.  4.  History of type a  aortic dissection with repair: Followed by CT surgery with stable anatomy on CTA in June.  Blood pressure management as outlined above.  5.  History of by cerebral embolic strokes: In the setting of type aortic dissection and repair.  Some expressive aphasia but otherwise does well.  He remains on aspirin therapy as prescribed by neurology.  6.  Hyperlipidemia: LDL of 88 in March 2021.  He did not tolerate statin therapy secondary to myalgias and takes coenzyme Q 10.  7.  Cardiomyopathy: EF 45 to 50% by postoperative echocardiogram.  Euvolemic on examination.  No history of chest pain and has never had an ischemic evaluation.  Remains on beta-blocker therapy.  8.  Disposition: Follow-up in cardiology clinic in 1 year or sooner if necessary.   Nicolasa Ducking, NP 12/10/2020, 3:47 PM

## 2021-02-10 ENCOUNTER — Other Ambulatory Visit: Payer: Self-pay | Admitting: Cardiology

## 2021-03-03 ENCOUNTER — Other Ambulatory Visit: Payer: Self-pay | Admitting: Cardiology

## 2021-03-12 DIAGNOSIS — E669 Obesity, unspecified: Secondary | ICD-10-CM | POA: Diagnosis not present

## 2021-03-12 DIAGNOSIS — Z0000 Encounter for general adult medical examination without abnormal findings: Secondary | ICD-10-CM | POA: Diagnosis not present

## 2021-03-12 DIAGNOSIS — I69990 Apraxia following unspecified cerebrovascular disease: Secondary | ICD-10-CM | POA: Diagnosis not present

## 2021-03-12 DIAGNOSIS — Z6836 Body mass index (BMI) 36.0-36.9, adult: Secondary | ICD-10-CM | POA: Diagnosis not present

## 2021-03-12 DIAGNOSIS — Z1331 Encounter for screening for depression: Secondary | ICD-10-CM | POA: Diagnosis not present

## 2021-03-12 DIAGNOSIS — E782 Mixed hyperlipidemia: Secondary | ICD-10-CM | POA: Diagnosis not present

## 2021-03-12 DIAGNOSIS — E7849 Other hyperlipidemia: Secondary | ICD-10-CM | POA: Diagnosis not present

## 2021-07-09 ENCOUNTER — Other Ambulatory Visit: Payer: Self-pay | Admitting: Cardiothoracic Surgery

## 2021-07-09 DIAGNOSIS — I7101 Dissection of ascending aorta: Secondary | ICD-10-CM

## 2021-07-27 IMAGING — DX DG CHEST 1V PORT
1 series · 1 of 1 positions shown · non-contrast
Comparison: Chest x-ray 05/04/2019.

CLINICAL DATA: 53-year-old male with history of chest pain.

EXAM:
PORTABLE CHEST 1 VIEW

[chest ap]
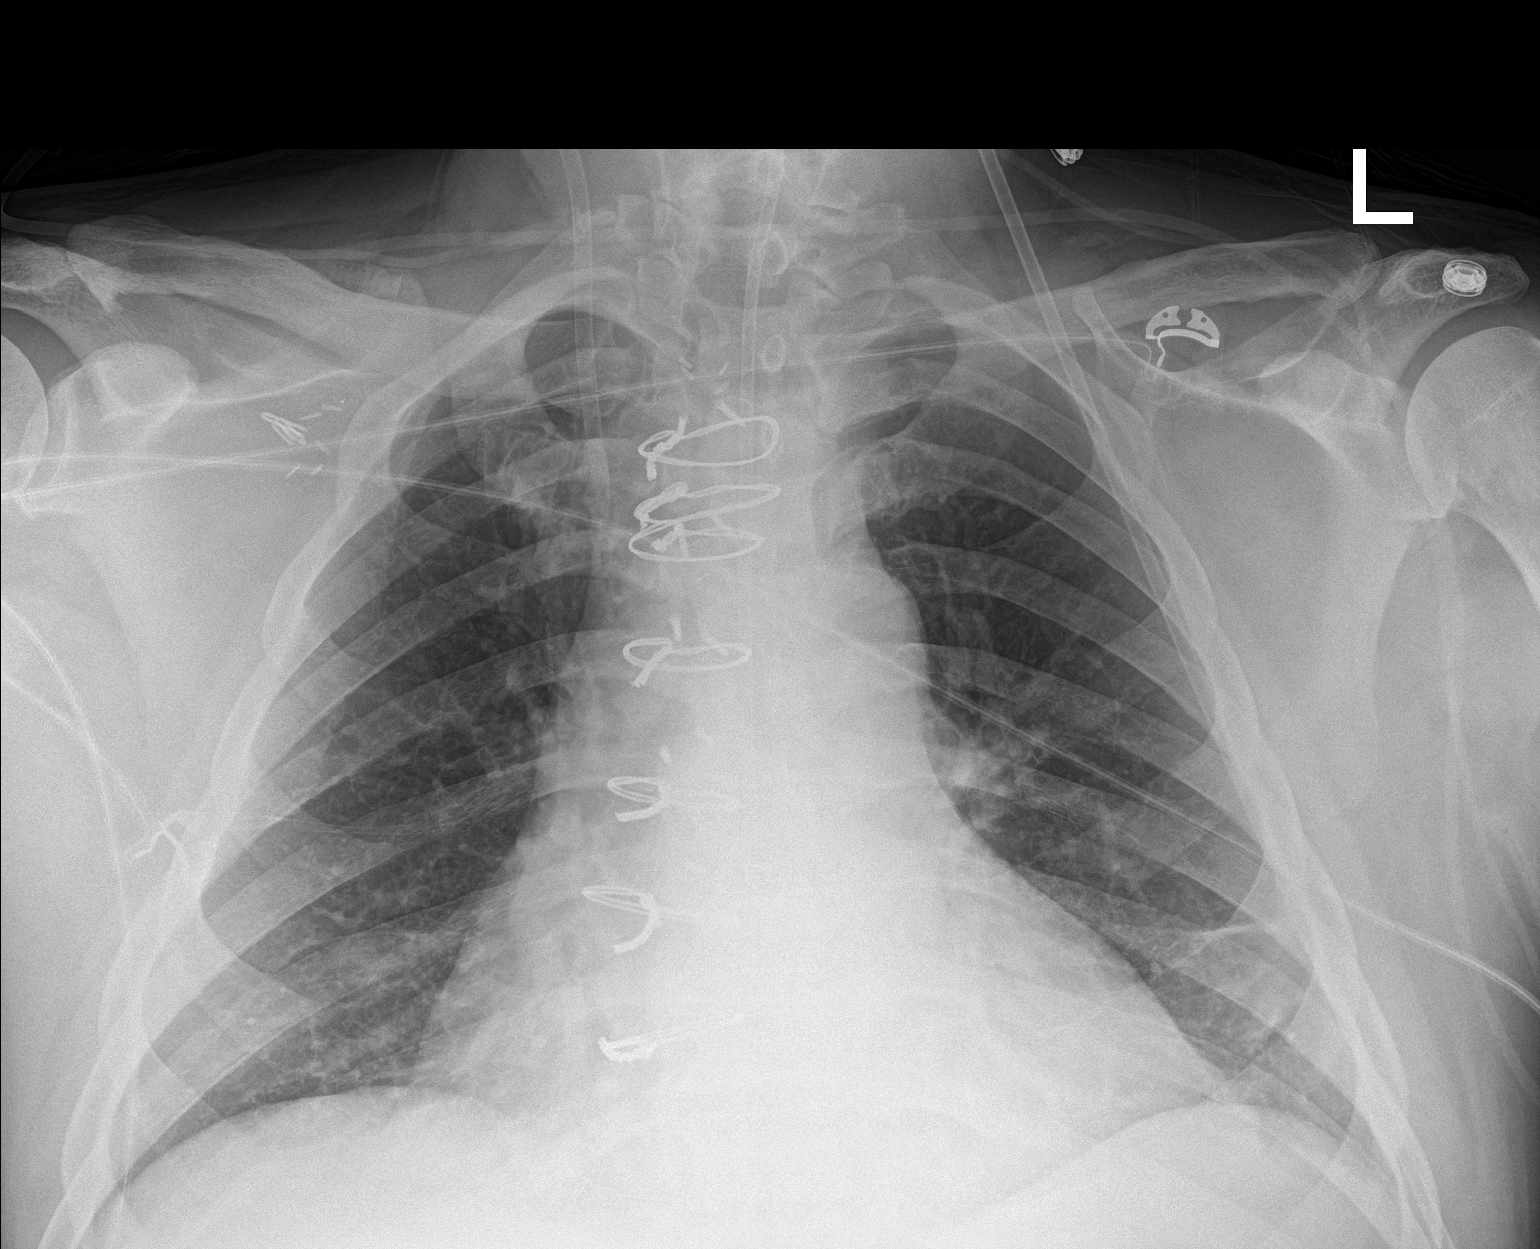

[1 of 1 positions shown; findings below may reference images not displayed]

FINDINGS: There is a right-sided internal jugular central venous catheter with
tip terminating in the proximal superior vena cava. A feeding tube
is seen extending into the abdomen, however, the tip of the feeding
tube extends below the lower margin of the image. Previously noted
left-sided chest tube and mediastinal/pericardial drain have been
removed. Lung volumes are low. Linear scarring or subsegmental
atelectasis in the left mid to lower lung. No acute consolidative
airspace disease. No pleural effusions. No pneumothorax. No evidence
of pulmonary edema. Heart size is mildly enlarged. Upper mediastinal
contours are within normal limits. Surgical clips project over the
level of the thoracic inlet and the right axillary region. Status
post median sternotomy.
IMPRESSION: 1. Postoperative changes and support apparatus, as above.
2. Low lung volumes with probable subsegmental atelectasis in the
left lower lobe.
3. Mild cardiomegaly.

## 2021-07-28 IMAGING — DX DG CHEST 2V
2 series · 2 of 2 positions shown · non-contrast
Comparison: Chest x-ray 05/05/2019.

CLINICAL DATA: 53-year-old male with history of chest pain.

EXAM:
CHEST - 2 VIEW

[chest pa]
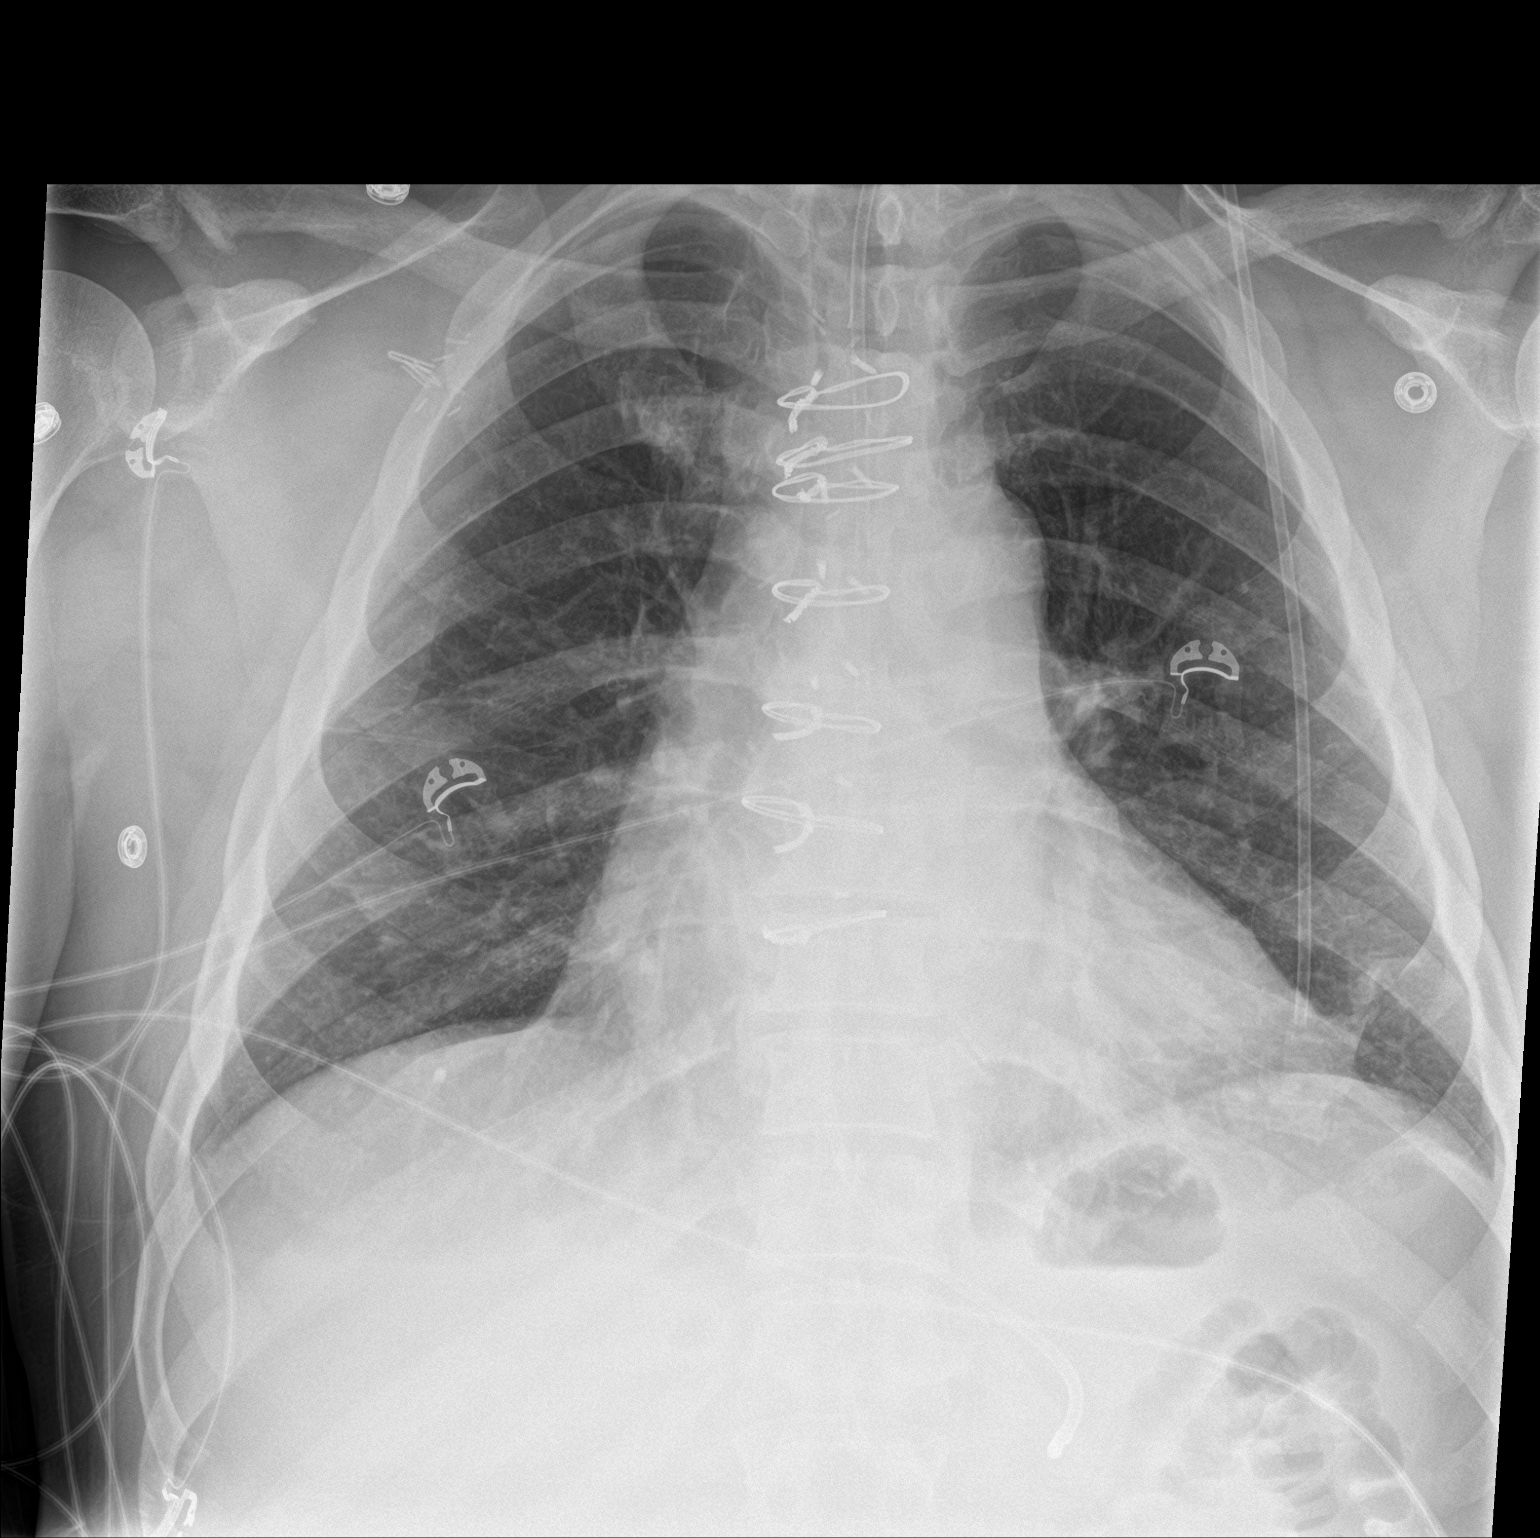

[chest lat]
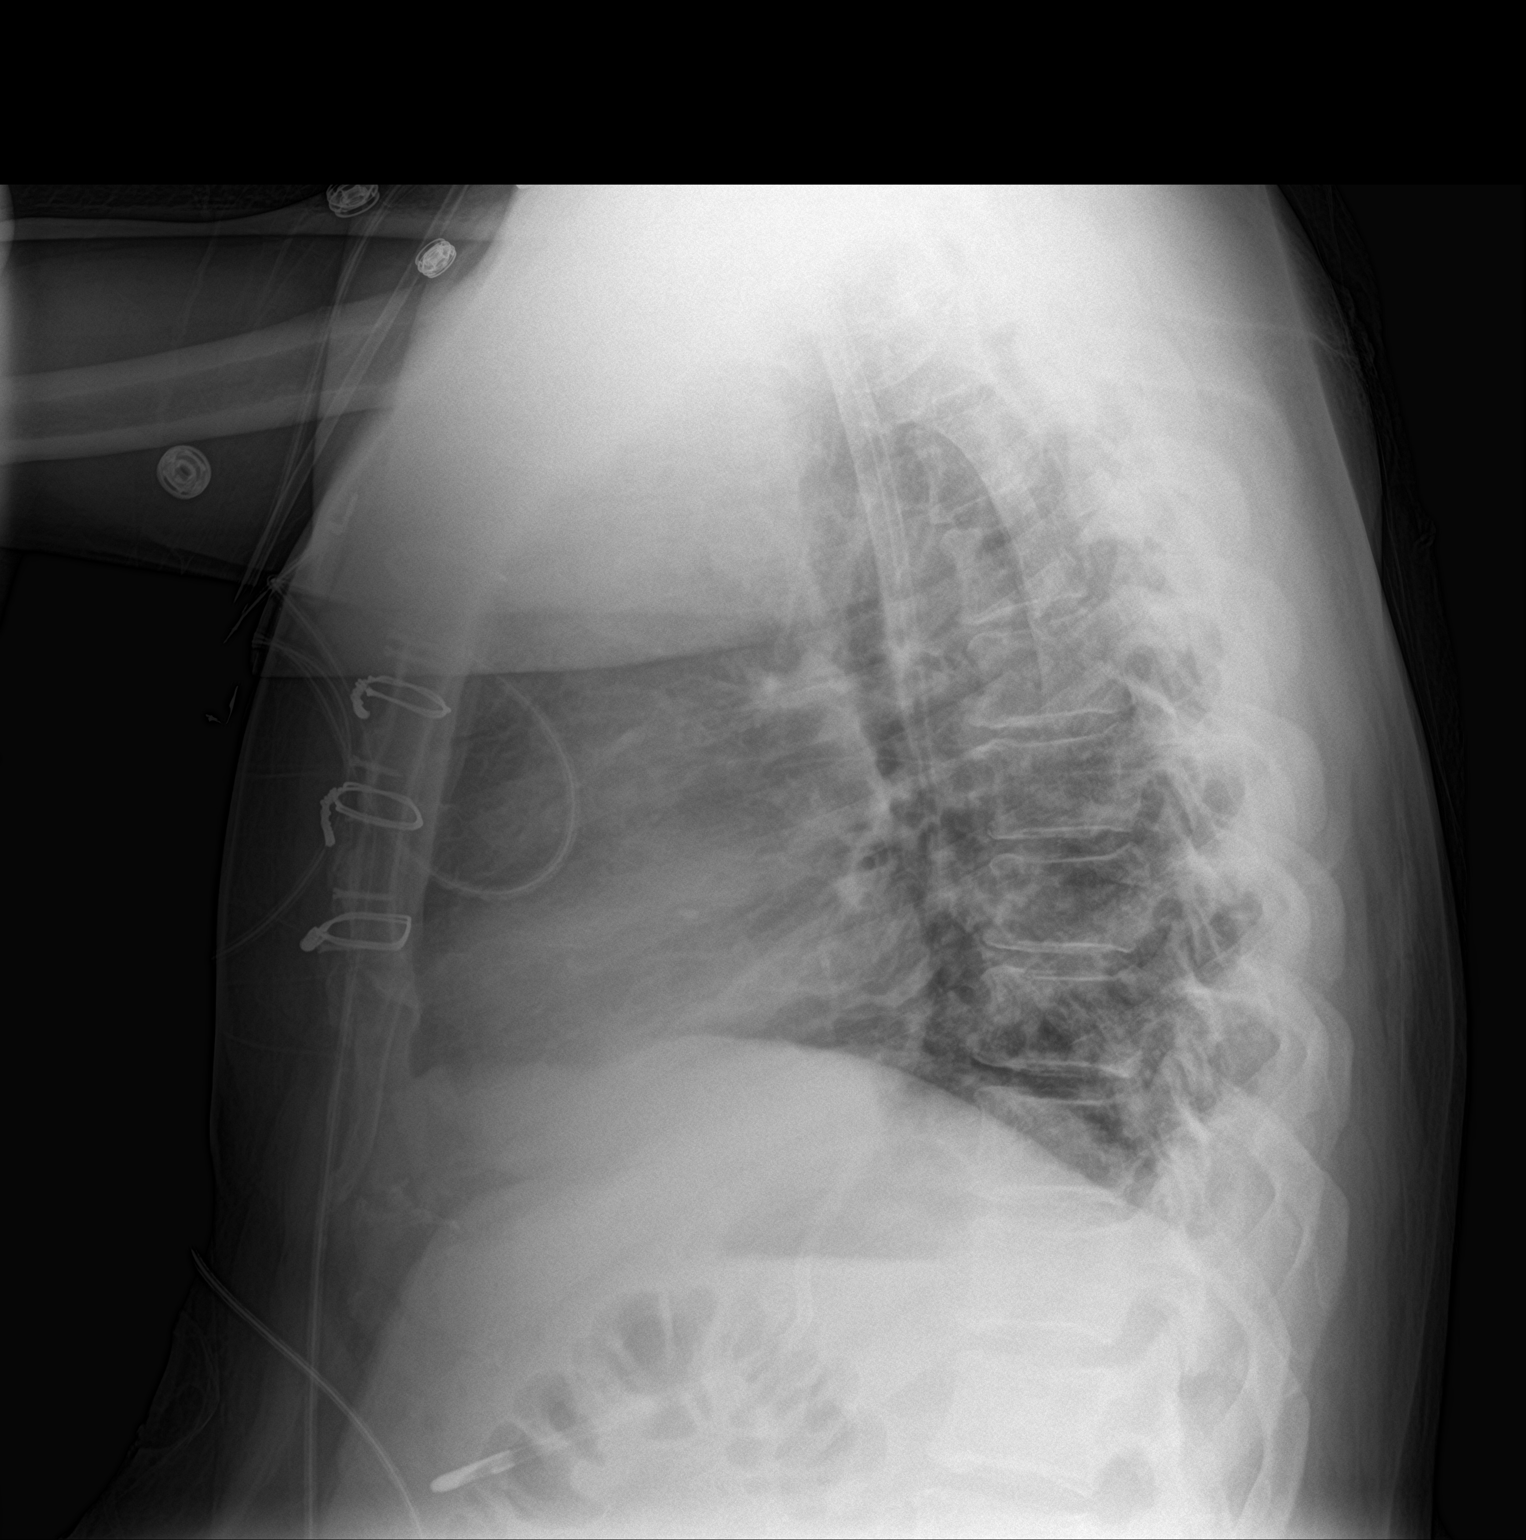

[2 of 2 positions shown; findings below may reference images not displayed]

FINDINGS: Previously noted right IJ Cordis has been removed. Feeding tube
extending into the body of the stomach. Lung volumes are normal. No
consolidative airspace disease. No pleural effusions. No
pneumothorax. No pulmonary nodule or mass noted. Pulmonary
vasculature is normal. Heart size is mildly enlarged. Upper
mediastinal contours are within normal limits. Status post median
sternotomy.
IMPRESSION: 1. Support apparatus, as above.
2. No radiographic evidence of acute cardiopulmonary disease.
3. Mild cardiomegaly.

## 2021-07-28 IMAGING — DX DG ABD PORTABLE 1V
2 series · 2 of 2 positions shown · non-contrast
Comparison: 05/04/2019

CLINICAL DATA: Abdominal pain

EXAM:
PORTABLE ABDOMEN - 1 VIEW

[abdomen kub (1 of 2)]
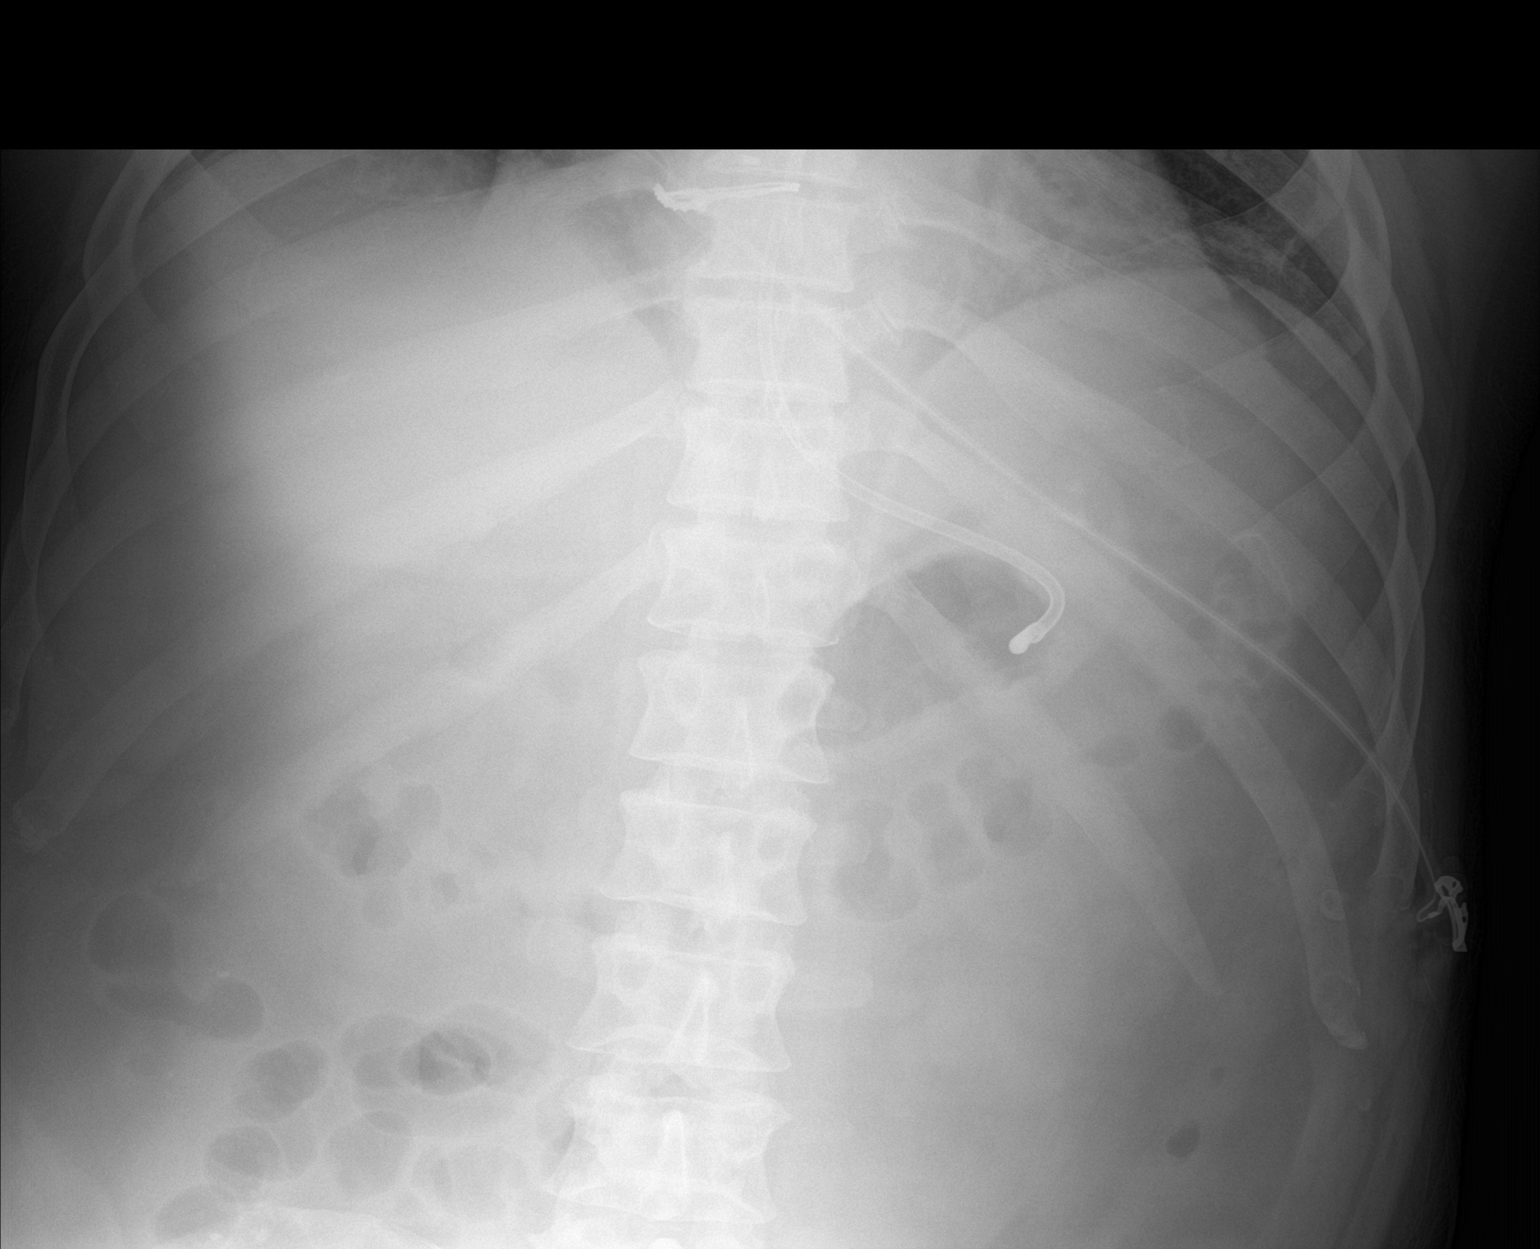

[abdomen kub (2 of 2)]
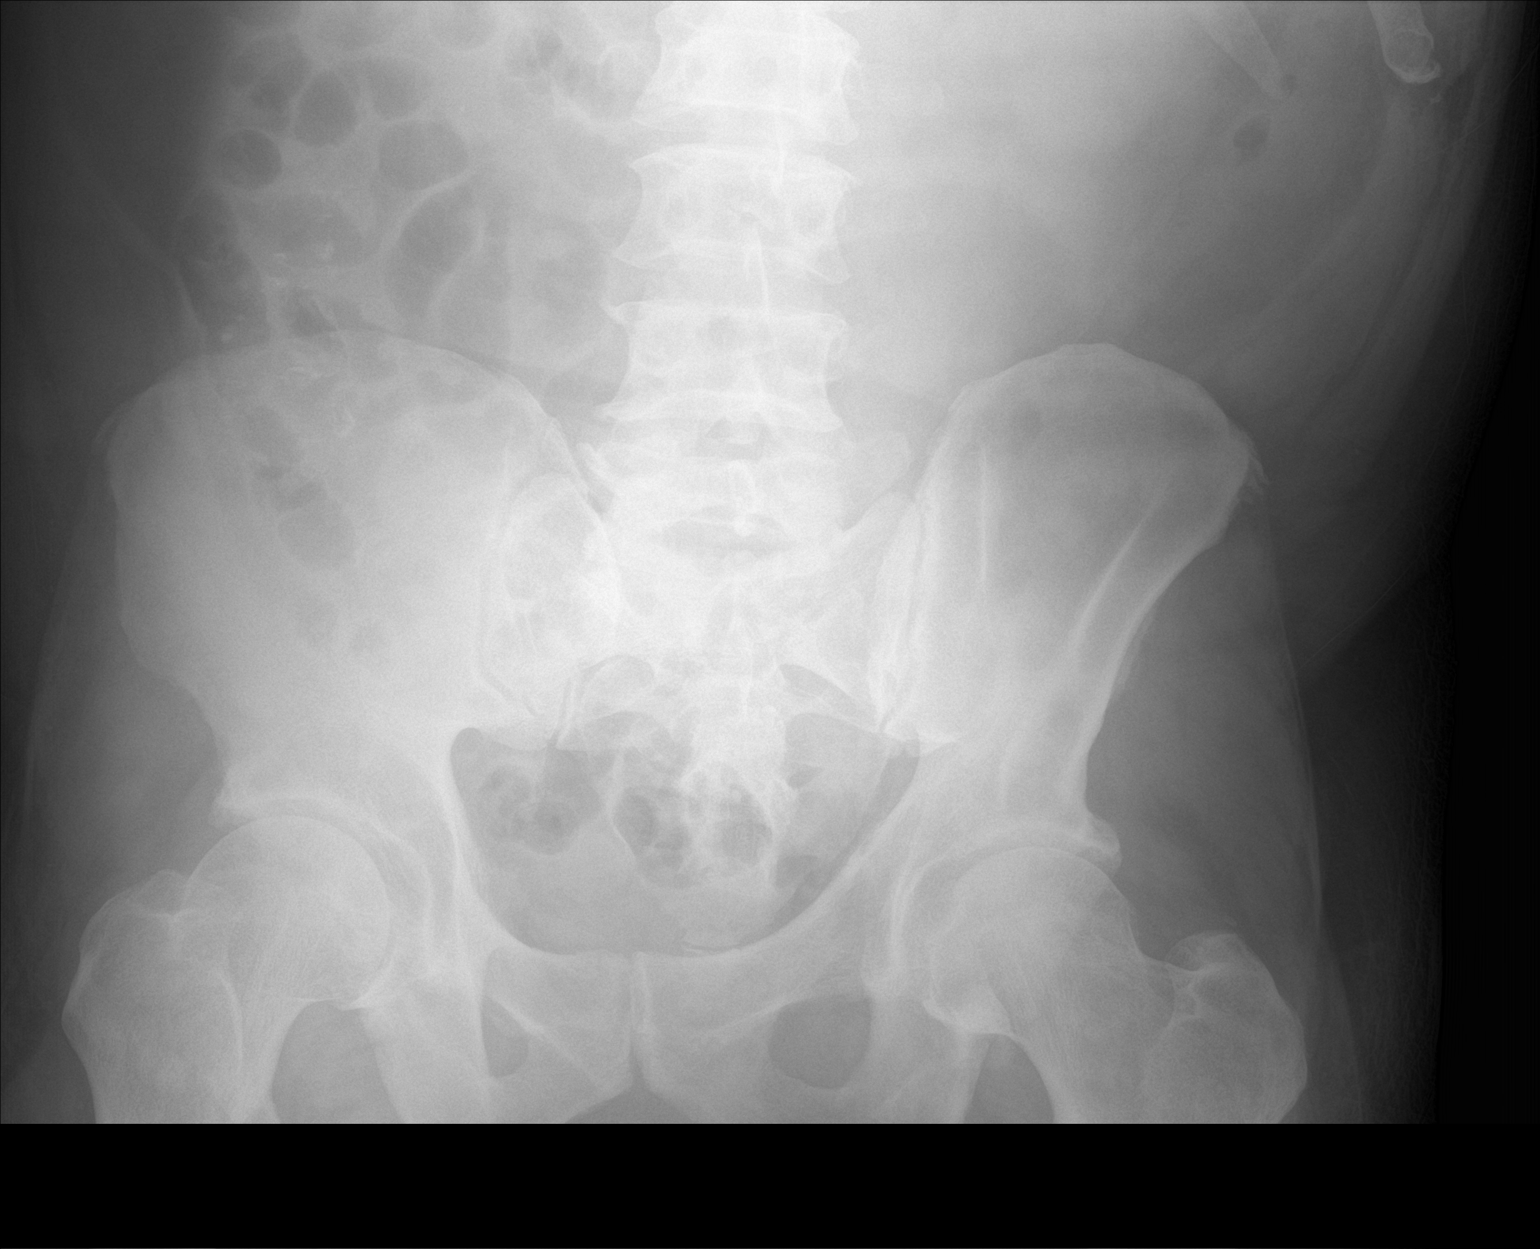

[2 of 2 positions shown; findings below may reference images not displayed]

FINDINGS: Enteric tube terminates within the stomach. Other lines and tubes
are no longer present. Bowel gas pattern is unremarkable. No
significant stool burden. Minimal residual contrast within the
colon.
IMPRESSION: Enteric tube within the stomach.  Unremarkable bowel gas pattern.

## 2021-08-08 ENCOUNTER — Other Ambulatory Visit: Payer: Self-pay | Admitting: Cardiology

## 2021-08-10 ENCOUNTER — Ambulatory Visit
Admission: RE | Admit: 2021-08-10 | Discharge: 2021-08-10 | Disposition: A | Discharge status: Home / Self Care | Payer: BC Managed Care – PPO | Source: Ambulatory Visit | Attending: Cardiothoracic Surgery | Admitting: Cardiothoracic Surgery

## 2021-08-10 ENCOUNTER — Encounter: Payer: Self-pay | Admitting: Cardiothoracic Surgery

## 2021-08-10 ENCOUNTER — Ambulatory Visit: Payer: BC Managed Care – PPO | Admitting: Cardiothoracic Surgery

## 2021-08-10 VITALS — BP 148/77 | HR 58 | Resp 20 | Ht 72.0 in | Wt 260.0 lb

## 2021-08-10 DIAGNOSIS — Z95828 Presence of other vascular implants and grafts: Secondary | ICD-10-CM | POA: Diagnosis not present

## 2021-08-10 DIAGNOSIS — I7101 Dissection of ascending aorta: Secondary | ICD-10-CM

## 2021-08-10 DIAGNOSIS — N3289 Other specified disorders of bladder: Secondary | ICD-10-CM | POA: Diagnosis not present

## 2021-08-10 DIAGNOSIS — K4090 Unilateral inguinal hernia, without obstruction or gangrene, not specified as recurrent: Secondary | ICD-10-CM | POA: Diagnosis not present

## 2021-08-10 DIAGNOSIS — L905 Scar conditions and fibrosis of skin: Secondary | ICD-10-CM

## 2021-08-10 DIAGNOSIS — Z9889 Other specified postprocedural states: Secondary | ICD-10-CM

## 2021-08-10 DIAGNOSIS — I7100 Dissection of unspecified site of aorta: Secondary | ICD-10-CM | POA: Diagnosis not present

## 2021-08-10 DIAGNOSIS — N281 Cyst of kidney, acquired: Secondary | ICD-10-CM | POA: Diagnosis not present

## 2021-08-10 DIAGNOSIS — I700 Atherosclerosis of aorta: Secondary | ICD-10-CM | POA: Diagnosis not present

## 2021-08-10 MED ORDER — IOPAMIDOL (ISOVUE-370) INJECTION 76%
75.0000 mL | Freq: Once | INTRAVENOUS | Status: AC | PRN
  Administered 2021-08-10: 75 mL via INTRAVENOUS

## 2021-08-10 NOTE — Progress Notes (Signed)
HPI: Patient returns for routine follow-up with CTA 2 year after repair of a type a aortic dissection March 2021.  His blood pressure is mildly elevated but he states he is compliant with his blood pressure medication including metoprolol and amlodipine.  He denies any chest pain. His speech is now much improved from the aphasia that he presented with at the time of the dissection.  I personally reviewed the CTA images today which show intact repair of a type a dissection without a persistent false lumen but with a small but stable intimal flap just distal to the left subclavian takeoff.  Current Outpatient Medications  Medication Sig Dispense Refill   amLODipine (NORVASC) 10 MG tablet TAKE 1 TABLET BY MOUTH  DAILY 90 tablet 3   aspirin EC 325 MG EC tablet Take 1 tablet (325 mg total) by mouth daily. 30 tablet 0   Coenzyme Q10 (CO Q 10) 100 MG CAPS Take by mouth.     cyanocobalamin 100 MCG tablet Take 100 mcg by mouth daily.     ezetimibe (ZETIA) 10 MG tablet Take 10 mg by mouth daily.     metoprolol tartrate (LOPRESSOR) 25 MG tablet TAKE 1 TABLET BY MOUTH TWICE A DAY 180 tablet 1   amantadine (SYMMETREL) 100 MG capsule Take 3 capsules (300 mg total) by mouth daily. For aphasia- (Patient not taking: Reported on 08/10/2021) [id number] capsule 3   folic acid (FOLVITE) 1 MG tablet Take 1 tablet (1 mg total) by mouth daily. (Patient not taking: Reported on 08/10/2021) 30 tablet 0   glucosamine-chondroitin 500-400 MG tablet Take 1 tablet by mouth 3 (three) times daily. (Patient not taking: Reported on 08/10/2021)     No current facility-administered medications for this visit.     Physical Exam: Blood pressure (!) 148/77, pulse (!) 58, resp. rate 20, height 6' (1.829 m), weight 260 lb (117.9 kg), SpO2 98 %.   Diagnostic Tests: CT scan images personally reviewed showing excellent repair of a type a dissection.  No evidence of false aneurysm or persistent false lumen.  Slight stable intimal tear just distal  to the left subclavian  Impression: Doing well with good blood pressure control and excellent functional status 2 years after repair of the type a dissection  Plan: He will continue his current blood pressure medication, check his blood pressure least once a week with a goal of keeping the systolic pressure less than 140 mmHg. He knows to avoid heavy weight lifting.  Dahlia Byes, MD [facility] [phone]

## 2021-09-30 ENCOUNTER — Encounter: Payer: Self-pay | Admitting: Physician Assistant

## 2021-09-30 ENCOUNTER — Ambulatory Visit: Payer: BC Managed Care – PPO | Admitting: Physician Assistant

## 2021-09-30 VITALS — BP 126/50 | HR 70 | Ht 73.0 in | Wt 261.4 lb

## 2021-09-30 DIAGNOSIS — I10 Essential (primary) hypertension: Secondary | ICD-10-CM

## 2021-09-30 DIAGNOSIS — I4729 Other ventricular tachycardia: Secondary | ICD-10-CM | POA: Diagnosis not present

## 2021-09-30 DIAGNOSIS — I7101 Dissection of ascending aorta: Secondary | ICD-10-CM

## 2021-09-30 NOTE — Patient Instructions (Signed)
Medication Instructions:  Your physician recommends that you continue on your current medications as directed. Please refer to the Current Medication list given to you today. *If you need a refill on your cardiac medications before your next appointment, please call your pharmacy*   Lab Work: None Ordered   Testing/Procedures: None Ordered   Follow-Up: At [facility], you and your health needs are our priority.  As part of our continuing mission to provide you with exceptional heart care, we have created designated Provider Care Teams.  These Care Teams include your primary Cardiologist (physician) and Advanced Practice Providers (APPs -  Physician Assistants and Nurse Practitioners) who all work together to provide you with the care you need, when you need it.  We recommend signing up for the patient portal called "MyChart".  Sign up information is provided on this After Visit Summary.  MyChart is used to connect with patients for Virtual Visits (Telemedicine).  Patients are able to view lab/test results, encounter notes, upcoming appointments, etc.  Non-urgent messages can be sent to your provider as well.   To learn more about what you can do with MyChart, go to [email or web address].    Your next appointment:   6 month(s)  The format for your next appointment:   In Person  Provider:   You may see Dina Rich, MD or one of the following Advanced Practice Providers on your designated Care Team:   Randall An, PA-C  Jacolyn Reedy, PA-C    Other Instructions   Important Information About Sugar

## 2021-09-30 NOTE — Progress Notes (Signed)
Cardiology Office Note:    Date:  09/30/2021   ID:  Erik Parker, DOB 1965-04-26, MRN [id number]  PCP:  Samuella Bruin  [facility] Cardiologist:  Dina Rich, MD  [facility] Electrophysiologist:  None   Chief Complaint: yearly follow up  History of Present Illness:    Erik Parker is a 56 y.o. 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   In March 2021, he was admitted with a type A aortic dissection involving the bilateral carotid arteries.  He underwent successful repair.  Postoperative course was complicated by pericarditis requiring treatment with colchicine, monomorphic VT (23 beats) requiring initiation of beta-blocker therapy, and embolic stroke.  Intraoperative TEE showed an EF of 50%.  Follow-up transthoracic echocardiogram in March 2021 showed an EF of 45 to 50% without regional wall motion abnormalities.  Last seen by Dr. Maren Beach 08/10/2021>> Per note "CT scan images personally reviewed showing excellent repair of a type a dissection.  No evidence of false aneurysm or persistent false lumen.  Slight stable intimal tear just distal to the left subclavian".  Here today for follow up.  Reports compliance with his medication.  Occasionally checks his blood pressure.  He is active.  He limits weightlifting as directed by the cardiothoracic surgeon.  Denies chest pain, shortness of breath, orthopnea, PND, syncope, lower extremity edema or melena.   Past Medical History:  Diagnosis Date   Cardiomyopathy (HCC)    a. 04/2019 TEE: EF 50%; b. 04/2019 Echo: EF 45-50%, glob HK, paradoxical septal motion, mild conc LVH, mildly reduced RV fxn, 38mm tube graft seen in Asc Ao w/ intact anastomoses.   Hx of repair of dissecting thoracic aortic  aneurysm, Stanford type A    a. 04/2019 s/p repair. Complicated by CVA (bilat carotid dissections) and pericarditis.   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 04/2019 following aortic dissection repair/post-op pericarditis.   Stroke [facility])     Past Surgical History:  Procedure Laterality Date   ACHILLES TENDON SURGERY Right 11/10/2016   Procedure: ACHILLES TENDON REPAIR VIA SPEED BRIDGE;  Surgeon: [name], DPM;  Location: [facility]S;  Service: Podiatry;  Laterality: Right;   HEEL SPUR RESECTION Right 11/10/2016   Procedure: HEEL SPUR RESECTION (HAGLUNDS DEFORMITY);  Surgeon: [name], DPM;  Location: [facility];  Service: Podiatry;  Laterality: Right;   REPAIR OF ACUTE ASCENDING THORACIC AORTIC DISSECTION N/A 05/01/2019   Procedure: REPAIR OF ACUTE ASCENDING THORACIC AORTIC DISSECTION USING HEMOSHIELD GRAFT SIZE ;  Surgeon: Linden Dolin, MD;  Location: [facility] OR;  Service: Cardiothoracic;  Laterality: N/A;    Current Medications: Current Meds  Medication Sig   amLODipine (NORVASC) 10 MG tablet TAKE 1 TABLET BY MOUTH  DAILY   aspirin EC 325 MG EC tablet Take 1 tablet (325 mg total) by mouth daily.   Coenzyme Q10 (CO Q 10) 100 MG CAPS Take by mouth.   cyanocobalamin 100 MCG tablet Take 100 mcg by mouth daily.   ezetimibe (ZETIA) 10 MG tablet Take 10 mg by mouth daily.   glucosamine-chondroitin 500-400 MG tablet Take 1 tablet by mouth daily.   metoprolol tartrate (LOPRESSOR) 25 MG tablet TAKE 1 TABLET BY MOUTH TWICE A DAY     Allergies:   Patient has no known  allergies.   Social History   Socioeconomic History   Marital status: Single    Spouse name: Not on file   Number of children: Not on file   Years of education: Not on file   Highest education level: Not on file  Occupational History   Not on file  Tobacco Use   Smoking status: Former    Packs/day: 0.50    Years: 9.00    Total pack years: 4.50    Types:  Cigarettes    Quit date: 05/01/2019    Years since quitting: 2.4   Smokeless tobacco: Never  Vaping Use   Vaping Use: Never used  Substance and Sexual Activity   Alcohol use: Yes    Alcohol/week: 1.0 standard drink of alcohol    Types: 1 Cans of beer per week    Comment: occ   Drug use: No   Sexual activity: Yes    Partners: Female    Birth control/protection: Pill  Other Topics Concern   Not on file  Social History Narrative   Not on file   Social Determinants of Health   Financial Resource Strain: Not on file  Food Insecurity: Not on file  Transportation Needs: Not on file  Physical Activity: Not on file  Stress: Not on file  Social Connections: Not on file     Family History: The patient's family history includes Hypertension in his father and mother.    ROS:   Please see the history of present illness.    All other systems reviewed and are negative.   EKGs/Labs/Other Studies Reviewed:    The following studies were reviewed today:  CT angio of chest abdomen and pelvis 08/10/21 IMPRESSION: Stable ascending aortic repair. Persistent intimal flap of the distal aortic arch extending into the proximal left subclavian artery without evidence of progression.   No acute findings in the abdomen or pelvis. Patent abdominal aorta and branch vessels without significant stenosis, aneurysm, or Dissection  Echo 05/2019 1. Left ventricular ejection fraction, by estimation, is 50%. The left  ventricle has low normal function. The left ventricle demonstrates  regional wall motion abnormalities (see scoring diagram/findings for  description). There is mild concentric left  ventricular hypertrophy. Left ventricular diastolic parameters were  normal. There is paradoxical motion of the interventricular septum.   2. Right ventricular systolic function mild to moderately reduced. The  right ventricular size is mildly enlarged. There is normal pulmonary  artery systolic pressure.    3. The mitral valve is grossly normal. Trivial mitral valve  regurgitation.   4. The aortic valve is tricuspid. Aortic valve regurgitation is not  visualized. No aortic stenosis is present.   5. 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   6. The inferior vena cava is normal in size with greater than 50%  respiratory variability, suggesting right atrial pressure of 3 mmHg.    EKG:  EKG is not  ordered today.   Recent Labs: No results found for requested labs within last 365 days.  Recent Lipid Panel    Component Value Date/Time   CHOL 148 05/04/2019 0621   TRIG 130 05/04/2019 1711   HDL 38 (L) 05/04/2019 0621   CHOLHDL 3.9 05/04/2019 0621   VLDL 22 05/04/2019 0621   LDLCALC 88 05/04/2019 0621    Physical Exam:    VS:  BP (!) 126/50   Pulse 70   Ht 6\' 1"  (1.854 m)   Wt 261 lb 6.4 oz (118.6 kg)   SpO2 98%  BMI 34.49 kg/m     Wt Readings from Last 3 Encounters:  09/30/21 261 lb 6.4 oz (118.6 kg)  08/10/21 260 lb (117.9 kg)  12/10/20 259 lb 9.6 oz (117.8 kg)     GEN:  Well nourished, well developed in no acute distress HEENT: Normal NECK: No JVD; No carotid bruits LYMPHATICS: No lymphadenopathy CARDIAC: RRR, no murmurs, rubs, gallops RESPIRATORY:  Clear to auscultation without rales, wheezing or rhonchi  ABDOMEN: Soft, non-tender, non-distended MUSCULOSKELETAL:  No edema; No deformity  SKIN: Warm and dry NEUROLOGIC:  Alert and oriented x 3 PSYCHIATRIC:  Normal affect   ASSESSMENT AND PLAN:    History of type aortic dissection status post repair Followed by [facility].  Limits weightlifting as directed.  2.  Hypertension Blood pressure is excellently controlled.  Continue beta-blocker and amlodipine.  Recommended to check blood pressure 1 or 2 times per week.  3.  Hyperlipidemia -Continue statin -Followed by PCP.  Medication Adjustments/Labs and Tests Ordered: Current medicines are reviewed at length with the patient today.  Concerns regarding  medicines are outlined above.  No orders of the defined types were placed in this encounter.  No orders of the defined types were placed in this encounter.   Patient Instructions  Medication Instructions:  Your physician recommends that you continue on your current medications as directed. Please refer to the Current Medication list given to you today. *If you need a refill on your cardiac medications before your next appointment, please call your pharmacy*   Lab Work: None Ordered   Testing/Procedures: None Ordered   Follow-Up: At [facility], you and your health needs are our priority.  As part of our continuing mission to provide you with exceptional heart care, we have created designated Provider Care Teams.  These Care Teams include your primary Cardiologist (physician) and Advanced Practice Providers (APPs -  Physician Assistants and Nurse Practitioners) who all work together to provide you with the care you need, when you need it.  We recommend signing up for the patient portal called "MyChart".  Sign up information is provided on this After Visit Summary.  MyChart is used to connect with patients for Virtual Visits (Telemedicine).  Patients are able to view lab/test results, encounter notes, upcoming appointments, etc.  Non-urgent messages can be sent to your provider as well.   To learn more about what you can do with MyChart, go to [email or web address].    Your next appointment:   6 month(s)  The format for your next appointment:   In Person  Provider:   You may see Dina Rich, MD or one of the following Advanced Practice Providers on your designated Care Team:   Randall An, PA-C  Jacolyn Reedy, [place]    Other Instructions   Important Information About Sugar         Signed, Manson Passey, [place]  09/30/2021 9:02 AM    [facility]

## 2022-02-04 ENCOUNTER — Other Ambulatory Visit: Payer: Self-pay | Admitting: Cardiology

## 2022-04-09 DIAGNOSIS — Z125 Encounter for screening for malignant neoplasm of prostate: Secondary | ICD-10-CM | POA: Diagnosis not present

## 2022-04-09 DIAGNOSIS — Z1331 Encounter for screening for depression: Secondary | ICD-10-CM | POA: Diagnosis not present

## 2022-04-09 DIAGNOSIS — E6609 Other obesity due to excess calories: Secondary | ICD-10-CM | POA: Diagnosis not present

## 2022-04-09 DIAGNOSIS — Z0000 Encounter for general adult medical examination without abnormal findings: Secondary | ICD-10-CM | POA: Diagnosis not present

## 2022-04-09 DIAGNOSIS — E782 Mixed hyperlipidemia: Secondary | ICD-10-CM | POA: Diagnosis not present

## 2022-04-09 DIAGNOSIS — Z6835 Body mass index (BMI) 35.0-35.9, adult: Secondary | ICD-10-CM | POA: Diagnosis not present

## 2022-04-09 DIAGNOSIS — I10 Essential (primary) hypertension: Secondary | ICD-10-CM | POA: Diagnosis not present

## 2022-04-09 DIAGNOSIS — I69990 Apraxia following unspecified cerebrovascular disease: Secondary | ICD-10-CM | POA: Diagnosis not present

## 2022-04-09 DIAGNOSIS — I7100 Dissection of unspecified site of aorta: Secondary | ICD-10-CM | POA: Diagnosis not present

## 2022-04-15 ENCOUNTER — Ambulatory Visit: Payer: BC Managed Care – PPO | Admitting: Nurse Practitioner

## 2022-04-20 ENCOUNTER — Encounter: Payer: Self-pay | Admitting: Nurse Practitioner

## 2022-04-20 ENCOUNTER — Ambulatory Visit: Payer: BC Managed Care – PPO | Attending: Nurse Practitioner | Admitting: Nurse Practitioner

## 2022-04-20 VITALS — BP 120/64 | HR 58 | Ht 72.0 in | Wt 268.6 lb

## 2022-04-20 DIAGNOSIS — Z79899 Other long term (current) drug therapy: Secondary | ICD-10-CM

## 2022-04-20 DIAGNOSIS — I429 Cardiomyopathy, unspecified: Secondary | ICD-10-CM

## 2022-04-20 DIAGNOSIS — E782 Mixed hyperlipidemia: Secondary | ICD-10-CM

## 2022-04-20 DIAGNOSIS — I10 Essential (primary) hypertension: Secondary | ICD-10-CM

## 2022-04-20 DIAGNOSIS — Z95828 Presence of other vascular implants and grafts: Secondary | ICD-10-CM

## 2022-04-20 DIAGNOSIS — Z8679 Personal history of other diseases of the circulatory system: Secondary | ICD-10-CM | POA: Diagnosis not present

## 2022-04-20 DIAGNOSIS — R011 Cardiac murmur, unspecified: Secondary | ICD-10-CM

## 2022-04-20 MED ORDER — EZETIMIBE 10 MG PO TABS: 10.0000 mg | ORAL_TABLET | Freq: Every day | ORAL | 3 refills | Status: AC

## 2022-04-20 MED ORDER — ATORVASTATIN CALCIUM 20 MG PO TABS: 20.0000 mg | ORAL_TABLET | Freq: Every day | ORAL | 3 refills | Status: AC

## 2022-04-20 NOTE — Patient Instructions (Signed)
Medication Instructions:  Your physician has recommended you make the following change in your medication:  Start atorvastatin 20 mg daily in the evening Continue other medications the same  Labwork: Your physician recommends that you return for a FASTING lipid/liver profile in 2 months (06/21/2022). Please do not eat or drink for at least 8 hours when you have this done. You may take your medications that morning with a sip of water. Lab [facility] [employer] in [place]  Testing/Procedures: Your physician has requested that you have an echocardiogram. Echocardiography is a painless test that uses sound waves to create images of your heart. It provides your doctor with information about the size and shape of your heart and how well your heart's chambers and valves are working. This procedure takes approximately one hour. There are no restrictions for this procedure. Please do NOT wear cologne, perfume, aftershave, or lotions (deodorant is allowed). Please arrive 15 minutes prior to your appointment time.  Follow-Up: Your physician recommends that you schedule a follow-up appointment in: 6 months with Dr. Harl Bowie  Any Other Special Instructions Will Be Listed Below (If Applicable).  If you need a refill on your cardiac medications before your next appointment, please call your pharmacy.

## 2022-04-20 NOTE — Progress Notes (Unsigned)
Cardiology Office Note:    Date:  04/20/2022  ID:  Erik Parker, DOB November 11, 1965, MRN [id number]  PCP:  Mann, Benjamin L, [place] [facility] Cardiologist:  Carlyle Dolly, MD     Referring MD: Cory Munch, PA-C   CC: Here for follow-up  History of Present Illness:    Erik Parker is a 57 y.o. male with a hx of the following:  Type A aortic dissection involving carotid arteries, s/p repair in 2021 Hypertension Hyperlipidemia History of stroke Postoperative pericarditis, monomorphic ventricular tachycardia Former smoker  In 2021 was admitted with type AAA aortic dissection involving bilateral carotid arteries, underwent successful repair.  Postoperatively, course was complicated by pericarditis and was treated with colchicine, monomorphic ventricular tachycardia, was started on beta-blocker therapy, and experienced embolic stroke.  Intraoperative TEE revealed EF of 50%, TTE in March 2021 showed EF 45 to 50%, no regional wall motion abnormalities. Seen by Dr. Darcey Nora in 07/2021. Was doing well post report, note revealed no evidence of false lumen or aneurysm, slight stable tear was noted to be distal to left subclavian.   Last seen by [name], PA-C on September 30, 2021.  Was doing well.  Was limiting weight lifting as directed by cardiothoracic surgeon.  He was doing well active.  Denied any acute cardiac complaints or issues. No medication changes were made. Was told to follow-up in 6 months.   Today he presents for follow-up. He states he is doing well. Denies any changes to his health since last OV. Denies any chest pain, shortness of breath, palpitations, syncope, presyncope, dizziness, orthopnea, PND, swelling or significant weight changes, acute bleeding, or claudication. Tolerating medications well.   Past Medical History:  Diagnosis Date   Cardiomyopathy ([place])    a. 04/2019 TEE: EF 50%; b. 04/2019 Echo: EF 45-50%, glob HK, paradoxical septal  motion, mild conc LVH, mildly reduced RV fxn, 47m tube graft seen in Asc Ao w/ intact anastomoses.   Hx of repair of dissecting thoracic aortic aneurysm, Stanford type A    a. 04/2019 s/p repair. Complicated by CVA (bilat carotid dissections) and pericarditis.   Hyperlipidemia    Hypertension    Monomorphic ventricular tachycardia ([place]    a. 04/2019 following aortic dissection repair/post-op pericarditis.   Stroke ([facility]     Past Surgical History:  Procedure Laterality Date   ACHILLES TENDON SURGERY Right 11/10/2016   Procedure: ACHILLES TENDON REPAIR VIA SPEED BRIDGE;  Surgeon: PTyson Babinski DPM;  Location: [facility];  Service: Podiatry;  Laterality: Right;   HEEL SPUR RESECTION Right 11/10/2016   Procedure: HEEL SPUR RESECTION (HAGLUNDS DEFORMITY);  Surgeon: PTyson Babinski DPM;  Location: [facility];  Service: Podiatry;  Laterality: Right;   REPAIR OF ACUTE ASCENDING THORACIC AORTIC DISSECTION N/A 05/01/2019   Procedure: REPAIR OF ACUTE ASCENDING THORACIC AORTIC DISSECTION USING HEMOSHIELD GRAFT SIZE 28MM;  Surgeon: AWonda Olds MD;  [facility];  Service: Cardiothoracic;  Laterality: N/A;    Current Medications: Current Meds  Medication Sig   amLODipine (NORVASC) 10 MG tablet TAKE 1 TABLET BY MOUTH DAILY   aspirin EC 325 MG EC tablet Take 1 tablet (325 mg total) by mouth daily.   Coenzyme Q10 (CO Q 10) 100 MG CAPS Take by mouth.   cyanocobalamin 100 MCG tablet Take 100 mcg by mouth daily.   glucosamine-chondroitin 500-400 MG tablet Take 1 tablet by mouth daily.   metoprolol tartrate (LOPRESSOR) 25 MG tablet TAKE 1 TABLET BY MOUTH TWICE  A DAY   ezetimibe (ZETIA) 10 MG tablet Take 10 mg by mouth daily.     Allergies:   Patient has no known allergies.   Social History   Socioeconomic History   Marital status: Single    Spouse name: Not on file   Number of children: Not on file   Years of education: Not on file   Highest education level: Not on file  Occupational  History   Not on file  Tobacco Use   Smoking status: Former    Packs/day: 0.50    Years: 9.00    Total pack years: 4.50    Types: Cigarettes    Quit date: 05/01/2019    Years since quitting: 2.9   Smokeless tobacco: Never  Vaping Use   Vaping Use: Never used  Substance and Sexual Activity   Alcohol use: Yes    Alcohol/week: 1.0 standard drink of alcohol    Types: 1 Cans of beer per week    Comment: occ   Drug use: No   Sexual activity: Yes    Partners: Female    Birth control/protection: Pill  Other Topics Concern   Not on file  Social History Narrative   Not on file   Social Determinants of Health   Financial Resource Strain: Not on file  Food Insecurity: Not on file  Transportation Needs: Not on file  Physical Activity: Not on file  Stress: Not on file  Social Connections: Not on file     Family History: The patient's family history includes Hypertension in his father and mother.  ROS:   Please see the history of present illness.     All other systems reviewed and are negative.  EKGs/Labs/Other Studies Reviewed:    The following studies were reviewed today:   EKG:  EKG is  ordered today.  The ekg ordered today demonstrates SB, 57 bpm, nonspecific ST segment changes, otherwise nothing acute.   Echocardiogram on 06/18/2019:  1. Left ventricular ejection fraction, by estimation, is 50%. The left  ventricle has low normal function. The left ventricle demonstrates  regional wall motion abnormalities (see scoring diagram/findings for  description). There is mild concentric left  ventricular hypertrophy. Left ventricular diastolic parameters were  normal. There is paradoxical motion of the interventricular septum.   2. Right ventricular systolic function mild to moderately reduced. The  right ventricular size is mildly enlarged. There is normal pulmonary  artery systolic pressure.   3. The mitral valve is grossly normal. Trivial mitral valve  regurgitation.   4.  The aortic valve is tricuspid. Aortic valve regurgitation is not  visualized. No aortic stenosis is present.   5. 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   6. The inferior vena cava is normal in size with greater than 50%  respiratory variability, suggesting right atrial pressure of 3 mmHg.  Recent Labs: No results found for requested labs within last 365 days.  Recent Lipid Panel    Component Value Date/Time   CHOL 148 05/04/2019 0621   TRIG 130 05/04/2019 1711   HDL 38 (L) 05/04/2019 0621   CHOLHDL 3.9 05/04/2019 0621   VLDL 22 05/04/2019 0621   LDLCALC 88 05/04/2019 0621    Physical Exam:    VS:  BP 120/64   Pulse (!) 58   Ht 6' (1.829 m)   Wt 268 lb 9.6 oz (121.8 kg)   SpO2 98%   BMI 36.43 kg/m     Wt Readings from Last 3 Encounters:  04/20/22 268 lb 9.6 oz (121.8 kg)  09/30/21 261 lb 6.4 oz (118.6 kg)  08/10/21 260 lb (117.9 kg)     [id number], 57 y.o. male in no acute distress HEENT: Normal NECK: No JVD; No carotid bruits CARDIAC: S1/S2, RRR, Grade 2/6 murmur, no rubs, no gallops; 2+ pulses RESPIRATORY:  Clear to auscultation without rales, wheezing or rhonchi  MUSCULOSKELETAL:  No edema; No deformity  SKIN: Warm and dry NEUROLOGIC:  Alert and oriented x 3 PSYCHIATRIC:  Normal affect   ASSESSMENT:    1. S/P ascending aortic replacement   2. Mixed hyperlipidemia   3. Medication management   4. Essential hypertension, benign   5. Heart murmur   6. History of cardiomyopathy    PLAN:    In order of problems listed above:  S/P Type A aortic dissection involving carotid arteries, s/p repair in 2021 Doing well. Denies any issues. Last CT Angio of chest showed stable ascending aortic repair, persistent intimal flap of distal aortic arch extending into proximal left subclavian artery without evidence of progression. No acute findings. Continue to follow-up with [facility]. Heart healthy diet and regular cardiovascular exercise encouraged.   2.  HLD Recent LDL 120. Currently taking Zetia 10 mg daily, will refill. Will initiate 20 mg of Lipitor daily. Will repeat FLP and LFT in 2 months per protocol. Heart healthy diet and regular cardiovascular exercise encouraged.   3. HTN BP well controlled. Discussed to monitor BP at home at least 2 hours after medications and sitting for 5-10 minutes. Continue current medication regimen. Heart healthy diet and regular cardiovascular exercise encouraged.   4. Murmur Grade 2/6 murmur noted on exam. Overall normal echo in 2021. Will update echo at this time.   5. Disposition: Follow-up with Dr. Carlyle Dolly in 6 months or sooner if anything changes.    Medication Adjustments/Labs and Tests Ordered: Current medicines are reviewed at length with the patient today.  Concerns regarding medicines are outlined above.  Orders Placed This Encounter  Procedures   Hepatic function panel   Lipid panel   EKG 12-Lead   ECHOCARDIOGRAM COMPLETE   Meds ordered this encounter  Medications   ezetimibe (ZETIA) 10 MG tablet    Sig: Take 1 tablet (10 mg total) by mouth daily.    Dispense:  90 tablet    Refill:  3   atorvastatin (LIPITOR) 20 MG tablet    Sig: Take 1 tablet (20 mg total) by mouth daily.    Dispense:  90 tablet    Refill:  3    04/20/2022 NEW    Patient Instructions  Medication Instructions:  Your physician has recommended you make the following change in your medication:  Start atorvastatin 20 mg daily in the evening Continue other medications the same  Labwork: Your physician recommends that you return for a FASTING lipid/liver profile in 2 months (06/21/2022). Please do not eat or drink for at least 8 hours when you have this done. You may take your medications that morning with a sip of water. Lab [facility] in [place]  Testing/Procedures: Your physician has requested that you have an echocardiogram. Echocardiography is a painless test that uses sound waves to create images of your  heart. It provides your doctor with information about the size and shape of your heart and how well your heart's chambers and valves are working. This procedure takes approximately one hour. There are no restrictions for this procedure. Please do NOT wear cologne, perfume, aftershave, or lotions (deodorant is  allowed). Please arrive 15 minutes prior to your appointment time.  Follow-Up: Your physician recommends that you schedule a follow-up appointment in: 6 months with Dr. Harl Bowie  Any Other Special Instructions Will Be Listed Below (If Applicable).  If you need a refill on your cardiac medications before your next appointment, please call your pharmacy.   SignedFinis Bud, NP  04/21/2022 10:39 PM    [place]

## 2022-05-12 ENCOUNTER — Ambulatory Visit: Payer: BC Managed Care – PPO | Attending: Nurse Practitioner

## 2022-05-12 DIAGNOSIS — R011 Cardiac murmur, unspecified: Secondary | ICD-10-CM | POA: Diagnosis not present

## 2022-05-13 LAB — ECHOCARDIOGRAM COMPLETE
AR max vel: 3.08 cm2
AV Area VTI: 3.21 cm2
AV Area mean vel: 2.94 cm2
AV Mean grad: 3.4 mmHg
AV Peak grad: 6.7 mmHg
Ao pk vel: 1.3 m/s
Area-P 1/2: 3.83 cm2
Calc EF: 50.2 %
MV M vel: 2.28 m/s
MV Peak grad: 20.8 mmHg
S' Lateral: 4.3 cm
Single Plane A2C EF: 52.2 %
Single Plane A4C EF: 50.7 %

## 2022-06-21 DIAGNOSIS — Z79899 Other long term (current) drug therapy: Secondary | ICD-10-CM | POA: Diagnosis not present

## 2022-06-21 DIAGNOSIS — I429 Cardiomyopathy, unspecified: Secondary | ICD-10-CM | POA: Diagnosis not present

## 2022-06-21 DIAGNOSIS — E782 Mixed hyperlipidemia: Secondary | ICD-10-CM | POA: Diagnosis not present

## 2022-06-22 LAB — HEPATIC FUNCTION PANEL
ALT: 34 IU/L (ref 0–44)
AST: 22 IU/L (ref 0–40)
Albumin: 4.4 g/dL (ref 3.8–4.9)
Alkaline Phosphatase: 116 IU/L (ref 44–121)
Bilirubin Total: 0.3 mg/dL (ref 0.0–1.2)
Bilirubin, Direct: <0.1 mg/dL (ref 0.00–0.40)
Total Protein: 7 g/dL (ref 6.0–8.5)

## 2022-06-22 LAB — LIPID PANEL
Chol/HDL Ratio: 2.9 ratio (ref 0.0–5.0)
Cholesterol, Total: 135 mg/dL (ref 100–199)
HDL: 46 mg/dL (ref >39)
LDL Chol Calc (NIH): 70 mg/dL (ref 0–99)
Triglycerides: 102 mg/dL (ref 0–149)
VLDL Cholesterol Cal: 19 mg/dL (ref 5–40)

## 2022-07-02 ENCOUNTER — Telehealth: Payer: Self-pay | Admitting: *Deleted

## 2022-07-02 NOTE — Telephone Encounter (Signed)
  Lesle Chris, LPN 04/10/5619  3:08 PM EDT Back to Top    Notified patient via detailed message.  Copy to pcp.

## 2022-07-02 NOTE — Telephone Encounter (Signed)
-----   Message from Sharlene Dory, NP sent at 06/30/2022 11:26 PM EDT ----- Labs look good.   Sharlene Dory, AGNP-C

## 2022-07-13 ENCOUNTER — Other Ambulatory Visit: Payer: Self-pay | Admitting: Cardiothoracic Surgery

## 2022-07-13 DIAGNOSIS — I7101 Dissection of ascending aorta: Secondary | ICD-10-CM

## 2022-08-24 ENCOUNTER — Encounter: Payer: Self-pay | Admitting: Cardiothoracic Surgery

## 2022-08-30 ENCOUNTER — Ambulatory Visit
Admission: RE | Admit: 2022-08-30 | Discharge: 2022-08-30 | Disposition: A | Discharge status: Home / Self Care | Payer: BC Managed Care – PPO | Source: Ambulatory Visit | Attending: Cardiothoracic Surgery | Admitting: Cardiothoracic Surgery

## 2022-08-30 ENCOUNTER — Ambulatory Visit: Payer: BC Managed Care – PPO | Admitting: Cardiothoracic Surgery

## 2022-08-30 DIAGNOSIS — I7101 Dissection of ascending aorta: Secondary | ICD-10-CM

## 2022-08-30 MED ORDER — IOPAMIDOL (ISOVUE-370) INJECTION 76%
75.0000 mL | Freq: Once | INTRAVENOUS | Status: AC | PRN
  Administered 2022-08-30: 75 mL via INTRAVENOUS

## 2022-09-13 ENCOUNTER — Ambulatory Visit: Payer: BC Managed Care – PPO | Admitting: Cardiothoracic Surgery

## 2022-09-20 ENCOUNTER — Encounter: Payer: Self-pay | Admitting: Cardiothoracic Surgery

## 2022-09-20 ENCOUNTER — Ambulatory Visit: Payer: BC Managed Care – PPO | Admitting: Cardiothoracic Surgery

## 2022-09-20 VITALS — BP 138/79 | HR 55 | Resp 18 | Ht 72.0 in | Wt 270.0 lb

## 2022-09-20 DIAGNOSIS — Z95828 Presence of other vascular implants and grafts: Secondary | ICD-10-CM

## 2022-09-20 DIAGNOSIS — I7101 Dissection of ascending aorta: Secondary | ICD-10-CM | POA: Diagnosis not present

## 2022-09-20 NOTE — Progress Notes (Signed)
HPI: The patient returns for routine follow-up with CTA 3 years status post repair of a type a ascending aortic dissection March 2021.  He remains asymptomatic.  His blood pressure is fairly well-controlled and he is compliant with his blood pressure medication including metoprolol and amlodipine.  I personally reviewed the images of his CTA performed July 2024 showing the repair intact with a short segment persistent false lumen in the distal arch just beyond the origin of the left subclavian.  There is no false aneurysm or expansion of the native aorta.  Current Outpatient Medications  Medication Sig Dispense Refill   amLODipine (NORVASC) 10 MG tablet TAKE 1 TABLET BY MOUTH DAILY 90 tablet 3   aspirin EC 325 MG EC tablet Take 1 tablet (325 mg total) by mouth daily. 30 tablet 0   atorvastatin (LIPITOR) 20 MG tablet Take 1 tablet (20 mg total) by mouth daily. 90 tablet 3   ezetimibe (ZETIA) 10 MG tablet Take 1 tablet (10 mg total) by mouth daily. 90 tablet 3   glucosamine-chondroitin 500-400 MG tablet Take 1 tablet by mouth daily.     metoprolol tartrate (LOPRESSOR) 25 MG tablet TAKE 1 TABLET BY MOUTH TWICE A DAY 180 tablet 1   No current facility-administered medications for this visit.     Physical Exam: Vitals:   09/20/22 0953  BP: 138/79  Pulse: (!) 55  Resp: 18  SpO2: 95%        Exam    General- alert and comfortable    Neck- no JVD, no cervical adenopathy palpable, no carotid bruit   Lungs- clear without rales, wheezes   Cor- regular rate and rhythm, no murmur , gallop   Abdomen- soft, non-tender   Extremities - warm, non-tender, minimal edema   Neuro- oriented, appropriate, no focal weakness     Diagnostic Tests: Images of CTA performed July 2024 personally reviewed and discussed with the patient showing intact repair of his ascending aortic hemiarch graft.  Impression: Continue with good blood pressure control and surveillance scans for the dissection  repair  Plan: Return in July 2025 for CTA of the thoracic aorta   Lovett Sox, MD [facility] [phone]

## 2022-10-05 ENCOUNTER — Ambulatory Visit: Payer: BC Managed Care – PPO | Attending: Cardiology | Admitting: Cardiology

## 2022-10-05 VITALS — BP 148/80 | HR 57 | Ht 73.0 in | Wt 271.2 lb

## 2022-10-05 DIAGNOSIS — Z95828 Presence of other vascular implants and grafts: Secondary | ICD-10-CM

## 2022-10-05 DIAGNOSIS — E782 Mixed hyperlipidemia: Secondary | ICD-10-CM | POA: Diagnosis not present

## 2022-10-05 DIAGNOSIS — I10 Essential (primary) hypertension: Secondary | ICD-10-CM | POA: Diagnosis not present

## 2022-10-05 DIAGNOSIS — I4729 Other ventricular tachycardia: Secondary | ICD-10-CM | POA: Diagnosis not present

## 2022-10-05 NOTE — Patient Instructions (Signed)
Medication Instructions:   Continue all current medications.   Labwork:  none  Testing/Procedures:  none  Follow-Up:  6 months   Any Other Special Instructions Will Be Listed Below (If Applicable).  Please call the office at the end of the week with update on BP readings.    If you need a refill on your cardiac medications before your next appointment, please call your pharmacy.

## 2022-10-05 NOTE — Progress Notes (Signed)
Clinical Summary Mr. Dunner is a 57 y.o.male seen today for follow up of the following medical problems.    1. Nonsustaned VT - admission 04/2019 with CVA and acute dissection. Postop had run of monomorphic VT - 04/2019 LVEF 45-50% - 05/2019 Echo LVEF 50%, mild to mod RV dysfunction - resolved with beta blocker, optimizing electrolytes   - denies any palpitations - compliant with meds   2. Acute ascending aortic dissection - incidental findings, patient presented initially with acute stroke symptoms. Ascending dissection that extended into both carotids - s/p repair 05/01/19 with 28 mm Dacron graft - followed by Dr Vickey Sages as outpatient, plans for repeat CTA  - seen by Dr Maren Beach 08/2022     3. Postoperative pericarditis - completed course of colchicine - no recurrent symptoms.      4. 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 Ascending dissection that extended into both carotids - on follow imaging after repair stable carotid dissections, lumen of CCA improved - followed by neuro Dr Lanier Clam - neuro has recommedned ASA 325   5. HTN - compliant with meds   6. Hyperlipidemia - 04/2019 TC 148 TG 112 HDL 38 LDL 88 -  Labs followed by pcp  05/2022 TC 135 TG 102 HDL 46 LDL 70    Past Medical History:  Diagnosis Date   Cardiomyopathy ([facility])    a. 04/2019 TEE: EF 50%; b. 04/2019 Echo: EF 45-50%, glob HK, paradoxical septal motion, mild conc LVH, mildly reduced RV fxn, 28mm tube graft seen in Asc Ao w/ intact anastomoses.   Hx of repair of dissecting thoracic aortic aneurysm, Stanford type A    a. 04/2019 s/p repair. Complicated by CVA (bilat carotid dissections) and pericarditis.   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 04/2019 following aortic dissection repair/post-op pericarditis.   Stroke ([facility])      No Known Allergies   Current Outpatient Medications   Medication Sig Dispense Refill   amLODipine (NORVASC) 10 MG tablet TAKE 1 TABLET BY MOUTH DAILY 90 tablet 3   aspirin EC 325 MG EC tablet Take 1 tablet (325 mg total) by mouth daily. 30 tablet 0   atorvastatin (LIPITOR) 20 MG tablet Take 1 tablet (20 mg total) by mouth daily. 90 tablet 3   ezetimibe (ZETIA) 10 MG tablet Take 1 tablet (10 mg total) by mouth daily. 90 tablet 3   glucosamine-chondroitin 500-400 MG tablet Take 1 tablet by mouth daily.     metoprolol tartrate (LOPRESSOR) 25 MG tablet TAKE 1 TABLET BY MOUTH TWICE A DAY 180 tablet 1   No current facility-administered medications for this visit.     Past Surgical History:  Procedure Laterality Date   ACHILLES TENDON SURGERY Right 11/10/2016   Procedure: ACHILLES TENDON REPAIR VIA SPEED BRIDGE;  Surgeon: [name], DPM;  Location: [facility];  Service: Podiatry;  Laterality: Right;   HEEL SPUR RESECTION Right 11/10/2016   Procedure: HEEL SPUR RESECTION (HAGLUNDS DEFORMITY);  Surgeon: [name], DPM;  Location: [facility];  Service: Podiatry;  Laterality: Right;   REPAIR OF ACUTE ASCENDING THORACIC AORTIC DISSECTION N/A 05/01/2019   Procedure: REPAIR OF ACUTE ASCENDING THORACIC AORTIC DISSECTION USING HEMOSHIELD GRAFT SIZE ;  Surgeon: Linden Dolin, MD;  Location: [facility];  Service: Cardiothoracic;  Laterality: N/A;     No Known Allergies    Family History  Problem Relation Age  of Onset   Hypertension Mother    Hypertension Father      Social History Mr. Shami reports that he quit smoking about 3 years ago. His smoking use included cigarettes. He started smoking about 12 years ago. He has a 4.5 pack-year smoking history. He has never used smokeless tobacco. Mr. Gewirtz reports current alcohol use of about 1.0 standard drink of alcohol per week.   Review of Systems CONSTITUTIONAL: No weight loss, fever, chills, weakness or fatigue.  HEENT: Eyes: No visual loss, blurred vision, double vision or yellow  sclerae.No hearing loss, sneezing, congestion, runny nose or sore throat.  SKIN: No rash or itching.  CARDIOVASCULAR: per hpi RESPIRATORY: No shortness of breath, cough or sputum.  GASTROINTESTINAL: No anorexia, nausea, vomiting or diarrhea. No abdominal pain or blood.  GENITOURINARY: No burning on urination, no polyuria NEUROLOGICAL: No headache, dizziness, syncope, paralysis, ataxia, numbness or tingling in the extremities. No change in bowel or bladder control.  MUSCULOSKELETAL: No muscle, back pain, joint pain or stiffness.  LYMPHATICS: No enlarged nodes. No history of splenectomy.  PSYCHIATRIC: No history of depression or anxiety.  ENDOCRINOLOGIC: No reports of sweating, cold or heat intolerance. No polyuria or polydipsia.  Marland Kitchen   Physical Examination Today's Vitals   10/05/22 0849  BP: (!) 148/80  Pulse: (!) 57  SpO2: 95%  Weight: 271 lb 3.2 oz (123 kg)  Height: 6\' 1"  (1.854 m)  PainSc: 0-No pain   Body mass index is 35.78 kg/m.  Gen: resting comfortably, no acute distress HEENT: no scleral icterus, pupils equal round and reactive, no palptable cervical adenopathy,  CV: RRR, 2/6 systolic murmur rusb, no jvd Resp: Clear to auscultation bilaterally GI: abdomen is soft, non-tender, non-distended, normal bowel sounds, no hepatosplenomegaly MSK: extremities are warm, no edema.  Skin: warm, no rash Neuro:  no focal deficits Psych: appropriate affect   Diagnostic Studies   04/2022 echo:  1. Left ventricular ejection fraction, by estimation, is 50 to 55%. The  left ventricle has low normal function. The left ventricle has no regional  wall motion abnormalities. Left ventricular diastolic parameters were  normal. The average left ventricular   global longitudinal strain is -21.5 %. The global longitudinal strain is  normal.   2. Right ventricular systolic function is normal. The right ventricular  size is normal. Tricuspid regurgitation signal is inadequate for assessing   PA pressure.   3. The mitral valve is normal in structure. Trivial mitral valve  regurgitation. No evidence of mitral stenosis.   4. The aortic valve is tricuspid. Aortic valve regurgitation is not  visualized. No aortic stenosis is present.   5. 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    Assessment and Plan  1. Monomorphic VT/NSVT - isolated episode of NSVT postop aortic dissection repair - no recurrences during admission -no recent issues, continue to monitor   2. Aortic dissection s/p repair - followed closely by CT surgery - continue beta blocker, bp control   3. CVA - in setting of aortic dissection involving bilateral carotids - followed by neuro - they have recommended high dose ASA. - continue ASA, statin  4. Hyperlipidemia - at goal, continue current meds     5. HTN - above goal, update [place] on home bp's end of the week - would likely add chlorthalidone if additional agent needed      Antoine Poche, M.D.

## 2022-12-07 DIAGNOSIS — R0789 Other chest pain: Secondary | ICD-10-CM | POA: Diagnosis not present

## 2022-12-07 DIAGNOSIS — R051 Acute cough: Secondary | ICD-10-CM | POA: Diagnosis not present

## 2022-12-07 DIAGNOSIS — J209 Acute bronchitis, unspecified: Secondary | ICD-10-CM | POA: Diagnosis not present

## 2022-12-07 DIAGNOSIS — R070 Pain in throat: Secondary | ICD-10-CM | POA: Diagnosis not present

## 2023-02-12 ENCOUNTER — Other Ambulatory Visit: Payer: Self-pay | Admitting: Cardiology

## 2023-02-26 DIAGNOSIS — Z1211 Encounter for screening for malignant neoplasm of colon: Secondary | ICD-10-CM | POA: Diagnosis not present

## 2023-02-26 DIAGNOSIS — Z1212 Encounter for screening for malignant neoplasm of rectum: Secondary | ICD-10-CM | POA: Diagnosis not present

## 2023-03-05 LAB — COLOGUARD: COLOGUARD: POSITIVE — AB

## 2023-03-05 LAB — EXTERNAL GENERIC LAB PROCEDURE: COLOGUARD: POSITIVE — AB

## 2023-04-13 ENCOUNTER — Ambulatory Visit: Payer: BC Managed Care – PPO | Attending: Cardiology | Admitting: Cardiology

## 2023-04-13 ENCOUNTER — Encounter: Payer: Self-pay | Admitting: Cardiology

## 2023-04-13 VITALS — BP 124/74 | HR 60 | Ht 72.0 in | Wt 270.2 lb

## 2023-04-13 DIAGNOSIS — E782 Mixed hyperlipidemia: Secondary | ICD-10-CM | POA: Diagnosis not present

## 2023-04-13 DIAGNOSIS — Z95828 Presence of other vascular implants and grafts: Secondary | ICD-10-CM

## 2023-04-13 DIAGNOSIS — I10 Essential (primary) hypertension: Secondary | ICD-10-CM | POA: Diagnosis not present

## 2023-04-13 DIAGNOSIS — I4729 Other ventricular tachycardia: Secondary | ICD-10-CM

## 2023-04-13 NOTE — Patient Instructions (Signed)

## 2023-04-13 NOTE — Progress Notes (Signed)
Clinical Summary [name] is a 58 y.o.male seen today for follow up of the following medical problems.    1. Nonsustaned VT - admission 04/2019 with CVA and acute dissection. Postop had run of monomorphic VT - 04/2019 LVEF 45-50% - 05/2019 Echo LVEF 50%, mild to mod RV dysfunction - resolved with beta blocker, optimizing electrolytes   - denies any palpitations - compliant with meds   2. Acute ascending aortic dissection - incidental findings, patient presented initially with acute stroke symptoms. Ascending dissection that extended into both carotids - s/p repair 05/01/19 with 28 mm Dacron graft   - seen by Dr Maren Beach 08/2022 - 08/2022 CTA: Postop chest with the ascending aortic repair. Small residual dissection flap again seen along the aortic arch with extension into the left subclavian origin without flow abnormality. Extent and distribution is similar to previous. No developing new dissection or aneurysm formation.  - from note plans for CT surgery f/u and CTA July 2025     3. Postoperative pericarditis - completed course of colchicine - no recurrent symptoms.      4. 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 Ascending dissection that extended into both carotids - on follow imaging after repair stable carotid dissections, lumen of CCA improved - followed by neuro Dr Lanier Clam - neuro has recommedned ASA 325   5. HTN - compliant with meds -    6. Hyperlipidemia 05/2022 TC 135 TG 102 HDL 46 LDL 70   Past Medical History:  Diagnosis Date   Cardiomyopathy (HCC)    a. 04/2019 TEE: EF 50%; b. 04/2019 Echo: EF 45-50%, glob HK, paradoxical septal motion, mild conc LVH, mildly reduced RV fxn, 28mm tube graft seen in Asc Ao w/ intact anastomoses.   Hx of repair of dissecting thoracic aortic aneurysm, Stanford type A    a. 04/2019 s/p repair. Complicated by CVA (bilat carotid dissections) and  pericarditis.   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 04/2019 following aortic dissection repair/post-op pericarditis.   Stroke (HCC)      No Known Allergies   Current Outpatient Medications  Medication Sig Dispense Refill   amLODipine (NORVASC) 10 MG tablet TAKE 1 TABLET BY MOUTH DAILY 90 tablet 3   aspirin EC 325 MG EC tablet Take 1 tablet (325 mg total) by mouth daily. 30 tablet 0   atorvastatin (LIPITOR) 20 MG tablet Take 1 tablet (20 mg total) by mouth daily. 90 tablet 3   ezetimibe (ZETIA) 10 MG tablet Take 1 tablet (10 mg total) by mouth daily. 90 tablet 3   glucosamine-chondroitin 500-400 MG tablet Take 1 tablet by mouth daily.     metoprolol tartrate (LOPRESSOR) 25 MG tablet TAKE 1 TABLET BY MOUTH TWICE A DAY 180 tablet 1   No current facility-administered medications for this visit.     Past Surgical History:  Procedure Laterality Date   ACHILLES TENDON SURGERY Right 11/10/2016   Procedure: ACHILLES TENDON REPAIR VIA SPEED BRIDGE;  Surgeon: [name], DPM;  Location: [facility];  Service: Podiatry;  Laterality: Right;   HEEL SPUR RESECTION Right 11/10/2016   Procedure: HEEL SPUR RESECTION (HAGLUNDS DEFORMITY);  Surgeon: [name], DPM;  Location: [facility];  Service: Podiatry;  Laterality: Right;   REPAIR OF ACUTE ASCENDING THORACIC AORTIC DISSECTION N/A 05/01/2019   Procedure: REPAIR OF ACUTE ASCENDING THORACIC AORTIC DISSECTION USING HEMOSHIELD GRAFT SIZE ;  Surgeon: Linden Dolin, MD;  Location: [facility];  Service: Cardiothoracic;  Laterality: N/A;     No Known Allergies    Family History  Problem Relation Age of Onset   Hypertension Mother    Hypertension Father      Social History [name] reports that he quit smoking about 3 years ago. His smoking use included cigarettes. He started smoking about 12 years ago. He has a 4.5 pack-year smoking history. He has never used smokeless tobacco. [name]  reports current alcohol use of about 1.0 standard drink of alcohol per week.   Physical Examination Today's Vitals   04/13/23 0827 04/13/23 0836  BP: (!) 140/80 (!) 140/84  Pulse: 60   SpO2: 97%   Weight: 270 lb 3.2 oz (122.6 kg)   Height: 6' (1.829 m)    Body mass index is 36.65 kg/m.  Gen: resting comfortably, no acute distress HEENT: no scleral icterus, pupils equal round and reactive, no palptable cervical adenopathy,  CV: RRR, no mrg, no jvd Resp: Clear to auscultation bilaterally GI: abdomen is soft, non-tender, non-distended, normal bowel sounds, no hepatosplenomegaly MSK: extremities are warm, no edema.  Skin: warm, no rash Neuro:  no focal deficits Psych: appropriate affect   Diagnostic Studies  04/2022 echo:  1. Left ventricular ejection fraction, by estimation, is 50 to 55%. The  left ventricle has low normal function. The left ventricle has no regional  wall motion abnormalities. Left ventricular diastolic parameters were  normal. The average left ventricular   global longitudinal strain is -21.5 %. The global longitudinal strain is  normal.   2. Right ventricular systolic function is normal. The right ventricular  size is normal. Tricuspid regurgitation signal is inadequate for assessing  PA pressure.   3. The mitral valve is normal in structure. Trivial mitral valve  regurgitation. No evidence of mitral stenosis.   4. The aortic valve is tricuspid. Aortic valve regurgitation is not  visualized. No aortic stenosis is present.   5. 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      Assessment and Plan   1. Monomorphic VT/NSVT - isolated episode of NSVT postop aortic dissection repair - no recurrences during admission - no symptoms, continue to monitor   2. Aortic dissection s/p repair - followed closely by CT surgery - continue beta blocker and bp control - should have f/u with CT surgery and repeat CTA 08/2023 per there last note   3.  CVA - in setting of aortic dissection involving bilateral carotids - followed by neuro - they have recommended high dose ASA.    4. Hyperlipidemia - LDL is at goal, continue current meds   5. HTN -at goal on manual recheck, continue current meds  EKG today SR, no acute ischemic changes F/u 1 year    Antoine Poche, M.D.

## 2023-04-14 DIAGNOSIS — Z6837 Body mass index (BMI) 37.0-37.9, adult: Secondary | ICD-10-CM | POA: Diagnosis not present

## 2023-04-14 DIAGNOSIS — I10 Essential (primary) hypertension: Secondary | ICD-10-CM | POA: Diagnosis not present

## 2023-04-14 DIAGNOSIS — R7309 Other abnormal glucose: Secondary | ICD-10-CM | POA: Diagnosis not present

## 2023-04-14 DIAGNOSIS — I69990 Apraxia following unspecified cerebrovascular disease: Secondary | ICD-10-CM | POA: Diagnosis not present

## 2023-04-14 DIAGNOSIS — Z0000 Encounter for general adult medical examination without abnormal findings: Secondary | ICD-10-CM | POA: Diagnosis not present

## 2023-04-14 DIAGNOSIS — E782 Mixed hyperlipidemia: Secondary | ICD-10-CM | POA: Diagnosis not present

## 2023-04-14 DIAGNOSIS — E7849 Other hyperlipidemia: Secondary | ICD-10-CM | POA: Diagnosis not present

## 2023-04-14 DIAGNOSIS — Z1331 Encounter for screening for depression: Secondary | ICD-10-CM | POA: Diagnosis not present

## 2023-04-14 DIAGNOSIS — E6609 Other obesity due to excess calories: Secondary | ICD-10-CM | POA: Diagnosis not present

## 2023-04-14 DIAGNOSIS — Z1389 Encounter for screening for other disorder: Secondary | ICD-10-CM | POA: Diagnosis not present

## 2023-04-14 DIAGNOSIS — I7100 Dissection of unspecified site of aorta: Secondary | ICD-10-CM | POA: Diagnosis not present

## 2023-04-18 DIAGNOSIS — M7662 Achilles tendinitis, left leg: Secondary | ICD-10-CM | POA: Diagnosis not present

## 2023-04-18 DIAGNOSIS — M79672 Pain in left foot: Secondary | ICD-10-CM | POA: Diagnosis not present

## 2023-04-18 DIAGNOSIS — S91302A Unspecified open wound, left foot, initial encounter: Secondary | ICD-10-CM | POA: Diagnosis not present

## 2023-04-18 DIAGNOSIS — M9262 Juvenile osteochondrosis of tarsus, left ankle: Secondary | ICD-10-CM | POA: Diagnosis not present

## 2023-05-16 ENCOUNTER — Other Ambulatory Visit (HOSPITAL_COMMUNITY): Payer: Self-pay | Admitting: Podiatry

## 2023-05-16 DIAGNOSIS — M7732 Calcaneal spur, left foot: Secondary | ICD-10-CM | POA: Diagnosis not present

## 2023-05-16 DIAGNOSIS — M7662 Achilles tendinitis, left leg: Secondary | ICD-10-CM | POA: Diagnosis not present

## 2023-05-16 DIAGNOSIS — M79672 Pain in left foot: Secondary | ICD-10-CM | POA: Diagnosis not present

## 2023-05-16 DIAGNOSIS — S86012A Strain of left Achilles tendon, initial encounter: Secondary | ICD-10-CM

## 2023-05-16 DIAGNOSIS — M9262 Juvenile osteochondrosis of tarsus, left ankle: Secondary | ICD-10-CM | POA: Diagnosis not present

## 2023-05-21 ENCOUNTER — Ambulatory Visit (HOSPITAL_COMMUNITY)
Admission: RE | Admit: 2023-05-21 | Discharge: 2023-05-21 | Disposition: A | Discharge status: Home / Self Care | Source: Ambulatory Visit | Attending: Podiatry | Admitting: Podiatry

## 2023-05-21 ENCOUNTER — Encounter (HOSPITAL_COMMUNITY): Payer: Self-pay | Admitting: Radiology

## 2023-05-21 DIAGNOSIS — S86012A Strain of left Achilles tendon, initial encounter: Secondary | ICD-10-CM | POA: Insufficient documentation

## 2023-05-21 DIAGNOSIS — M778 Other enthesopathies, not elsewhere classified: Secondary | ICD-10-CM | POA: Diagnosis not present

## 2023-05-21 DIAGNOSIS — R600 Localized edema: Secondary | ICD-10-CM | POA: Diagnosis not present

## 2023-05-21 DIAGNOSIS — M19072 Primary osteoarthritis, left ankle and foot: Secondary | ICD-10-CM | POA: Diagnosis not present

## 2023-05-21 DIAGNOSIS — M948X7 Other specified disorders of cartilage, ankle and foot: Secondary | ICD-10-CM | POA: Diagnosis not present

## 2023-05-23 DIAGNOSIS — M79672 Pain in left foot: Secondary | ICD-10-CM | POA: Diagnosis not present

## 2023-05-23 DIAGNOSIS — M9262 Juvenile osteochondrosis of tarsus, left ankle: Secondary | ICD-10-CM | POA: Diagnosis not present

## 2023-05-23 DIAGNOSIS — M7662 Achilles tendinitis, left leg: Secondary | ICD-10-CM | POA: Diagnosis not present

## 2023-05-23 DIAGNOSIS — M7732 Calcaneal spur, left foot: Secondary | ICD-10-CM | POA: Diagnosis not present

## 2023-05-24 ENCOUNTER — Other Ambulatory Visit: Payer: Self-pay | Admitting: Podiatry

## 2023-05-30 DIAGNOSIS — E6609 Other obesity due to excess calories: Secondary | ICD-10-CM | POA: Diagnosis not present

## 2023-05-30 DIAGNOSIS — I10 Essential (primary) hypertension: Secondary | ICD-10-CM | POA: Diagnosis not present

## 2023-05-30 DIAGNOSIS — Z6836 Body mass index (BMI) 36.0-36.9, adult: Secondary | ICD-10-CM | POA: Diagnosis not present

## 2023-05-30 DIAGNOSIS — E782 Mixed hyperlipidemia: Secondary | ICD-10-CM | POA: Diagnosis not present

## 2023-05-30 DIAGNOSIS — I69990 Apraxia following unspecified cerebrovascular disease: Secondary | ICD-10-CM | POA: Diagnosis not present

## 2023-05-30 DIAGNOSIS — Z01818 Encounter for other preprocedural examination: Secondary | ICD-10-CM | POA: Diagnosis not present

## 2023-05-30 DIAGNOSIS — I7100 Dissection of unspecified site of aorta: Secondary | ICD-10-CM | POA: Diagnosis not present

## 2023-05-30 DIAGNOSIS — Z01811 Encounter for preprocedural respiratory examination: Secondary | ICD-10-CM | POA: Diagnosis not present

## 2023-06-03 NOTE — Patient Instructions (Signed)
 Erik Parker  06/03/2023     @[email or web address] @   Your procedure is scheduled on  06/08/2023.   Report to [facility] at  [phone]  A.M.   Call this number if you have problems the morning of surgery:  [phone]  If you experience any cold or flu symptoms such as cough, fever, chills, shortness of breath, etc. between now and your scheduled surgery, please notify us at the above number.   Remember:  Do not eat after midnight.    You may drink clear liquids until  0430 am on 06/08/2023.    Clear liquids allowed are:                    Water, Juice (No red color; non-citric and without pulp; diabetics please choose diet or no sugar options), Carbonated beverages (diabetics please choose diet or no sugar options), Clear Tea (No creamer, milk, or cream, including half & half and powdered creamer), Black Coffee Only (No creamer, milk or cream, including half & half and powdered creamer), and Clear Sports drink (No red color; diabetics please choose diet or no sugar options)    Take these medicines the morning of surgery with A SIP OF WATER                                    amlodipine, metoprolol.    Do not wear jewelry, make-up or nail polish, including gel polish,  artificial nails, or any other type of covering on natural nails (fingers and  toes).  Do not wear lotions, powders, or perfumes, or deodorant.  Do not shave 48 hours prior to surgery.  Men may shave face and neck.  Do not bring valuables to the hospital.  [facility] is not responsible for any belongings or valuables.  Contacts, dentures or bridgework may not be worn into surgery.  Leave your suitcase in the car.  After surgery it may be brought to your room.  For patients admitted to the hospital, discharge time will be determined by your treatment team.  Patients discharged the day of surgery will not be allowed to drive home and must have someone with them for 24 hours.    Special instructions:   DO NOT  smoke tobacco or vape for 24 hours before your procedure.  Please read over the following fact sheets that you were given. Anesthesia Post-op Instructions and Care and Recovery After Surgery      Achilles Tendon Tear Repair, Care After After an Achilles tendon tear repair, it is common to have: Numbness in your foot. This should go away within 24 hours. Pain. It may take 6 months before you can go back to all of your normal activities. Full recovery may take a year or longer. Follow these instructions at home: Medicines Take over-the-counter and prescription medicines only as told by your health care provider. Ask your provider if the medicine prescribed to you: Requires you to avoid driving or using machinery. Can cause constipation. You may need to take these actions to prevent or treat constipation: Drink enough fluid to keep your pee (urine) pale yellow. Take over-the-counter or prescription medicines. Eat foods that are high in fiber, such as beans, whole grains, and fresh fruits and vegetables. Limit foods that are high in fat and processed sugars, such as fried or sweet foods. If you have a removable  splint or boot: Wear the splint or boot as told by your provider. Remove it only as told by your provider. Check the skin around the splint or boot every day. Tell your provider about any concerns. Loosen the splint or boot if your toes tingle, become numb, or turn cold and blue. Keep the splint or boot clean and dry. If you have a nonremovable cast: Do not put pressure on any part of the cast until it is fully hardened. This may take several hours. Do not stick anything inside the cast to scratch your skin. Doing that increases your risk of infection. Check the skin around the cast every day. Tell your provider about any concerns. You may put lotion on dry skin around the edges of the cast. Do not put lotion on the skin underneath the cast. Keep the cast clean and  dry. Bathing Do not take baths, swim, or use a hot tub until your provider approves. Ask your provider if you may take showers. You may only be allowed to take sponge baths. If the splint, boot, or cast is not waterproof: Do not let it get wet. Cover it with a watertight covering when you take a bath or a shower. Incision care  Follow instructions from your provider about how to take care of your incision. Make sure you: Wash your hands with soap and water for at least 20 seconds before and after you change your bandage (dressing). If soap and water are not available, use hand sanitizer. Change your dressing as told by your provider. Leave stitches (sutures), staples, or tape strips in place. These skin closures may need to stay in place for 2 weeks or longer. If tape strip edges start to loosen and curl up, you may trim the loose edges. Do not remove tape strips completely unless your provider tells you to do that. If you have a removable splint or boot, check your incision area every day for signs of infection. Check for: More redness, swelling, or pain. Fluid or blood. Warmth. Pus or a bad smell. Managing pain, stiffness, and swelling  If told, put ice on the injured area. If you have a removable splint or boot, remove it as told by your provider. Put ice in a plastic bag. Place a towel between your skin and the bag or between your cast and the bag. Leave the ice on for 20 minutes, 2-3 times a day. If your skin turns bright red, remove the ice right away to prevent skin damage. The risk of damage is higher if you cannot feel pain, heat, or cold. Move your toes often to reduce stiffness and swelling. Raise (elevate) your leg above the level of your heart while you are sitting or lying down. Activity Follow instructions from your provider about how to move your ankle and foot. You may get these instructions after your splint, boot, or cast is removed. Do not use the injured leg to  support your body weight until your provider says that you can. Use crutches, a rolling walker, or a knee scooter as told by your provider. Follow your rehab plan. Do exercises as told by your provider or physical therapist. Return to your normal activities as told by your provider. Do not return to physical activity or sports until you are told that you can. Ask your provider what activities are safe for you. General instructions Do not use any products that contain nicotine or tobacco. These products include cigarettes, chewing tobacco, and vaping devices,  such as e-cigarettes. These can delay incision healing after surgery. If you need help quitting, ask your provider. Ask your provider when it is safe to drive if you have a splint, boot, or cast on your leg. Keep all follow-up visits. If you have a cast or splint, it will be taken off about 2 weeks after surgery. If you have sutures or staples, they will be taken out about 2 weeks after surgery. Your provider may give you more instructions. Make sure you know what you can and cannot do. Contact a health care provider if: You have any signs of infection. Your pain does not get better when you take medicine. You have numbness, tingling, or burning in your foot or toes that does not go away. Your splint, boot, or cast feels too tight, even after you loosen it. You cannot wiggle your toes. You notice cracks or soft spots on your cast. You bleed through your splint, boot, or cast. Get help right away if: You have severe swelling, pain, or numbness that is getting worse. You have chest pain or trouble breathing. These symptoms may be an emergency. Get help right away. Call 911. Do not wait to see if the symptoms will go away. Do not drive yourself to the hospital. This information is not intended to replace advice given to you by your health care provider. Make sure you discuss any questions you have with your health care provider. Document  Revised: 03/04/2022 Document Reviewed: 03/04/2022 Elsevier Patient Education  2024 Elsevier Inc.General Anesthesia, Adult, Care After The following information offers guidance on how to care for yourself after your procedure. Your health care provider may also give you more specific instructions. If you have problems or questions, contact your health care provider. What can I expect after the procedure? After the procedure, it is common for people to: Have pain or discomfort at the IV site. Have nausea or vomiting. Have a sore throat or hoarseness. Have trouble concentrating. Feel cold or chills. Feel weak, sleepy, or tired (fatigue). Have soreness and body aches. These can affect parts of the body that were not involved in surgery. Follow these instructions at home: For the time period you were told by your health care provider:  Rest. Do not participate in activities where you could fall or become injured. Do not drive or use machinery. Do not drink alcohol. Do not take sleeping pills or medicines that cause drowsiness. Do not make important decisions or sign legal documents. Do not take care of children on your own. General instructions Drink enough fluid to keep your urine pale yellow. If you have sleep apnea, surgery and certain medicines can increase your risk for breathing problems. Follow instructions from your health care provider about wearing your sleep device: Anytime you are sleeping, including during daytime naps. While taking prescription pain medicines, sleeping medicines, or medicines that make you drowsy. Return to your normal activities as told by your health care provider. Ask your health care provider what activities are safe for you. Take over-the-counter and prescription medicines only as told by your health care provider. Do not use any products that contain nicotine or tobacco. These products include cigarettes, chewing tobacco, and vaping devices, such as  e-cigarettes. These can delay incision healing after surgery. If you need help quitting, ask your health care provider. Contact a health care provider if: You have nausea or vomiting that does not get better with medicine. You vomit every time you eat or drink. You have pain  that does not get better with medicine. You cannot urinate or have bloody urine. You develop a skin rash. You have a fever. Get help right away if: You have trouble breathing. You have chest pain. You vomit blood. These symptoms may be an emergency. Get help right away. Call 911. Do not wait to see if the symptoms will go away. Do not drive yourself to the hospital. Summary After the procedure, it is common to have a sore throat, hoarseness, nausea, vomiting, or to feel weak, sleepy, or fatigue. For the time period you were told by your health care provider, do not drive or use machinery. Get help right away if you have difficulty breathing, have chest pain, or vomit blood. These symptoms may be an emergency. This information is not intended to replace advice given to you by your health care provider. Make sure you discuss any questions you have with your health care provider. Document Revised: 05/15/2021 Document Reviewed: 05/15/2021 Elsevier Patient Education  2024 Elsevier Inc.How to Use Chlorhexidine at Home in the Shower Chlorhexidine gluconate (CHG) is a germ-killing (antiseptic) wash that's used to clean the skin. It can get rid of the germs that normally live on the skin and can keep them away for about 24 hours. If you're having surgery, you may be told to shower with CHG at home the night before surgery. This can help lower your risk for infection. To use CHG wash in the shower, follow the steps below. Supplies needed: CHG body wash. Clean washcloth. Clean towel. How to use CHG in the shower Follow these steps unless you're told to use CHG in a different way: Start the shower. Use your normal soap and  shampoo to wash your face and hair. Turn off the shower or move out of the shower stream. Pour CHG onto a clean washcloth. Do not use any type of brush or rough sponge. Start at your neck, washing your body down to your toes. Make sure you: Wash the part of your body where the surgery will be done for at least 1 minute. Do not scrub. Do not use CHG on your head or face unless your health care provider tells you to. If it gets into your ears or eyes, rinse them well with water. Do not wash your genitals with CHG. Wash your back and under your arms. Make sure to wash skin folds. Let the CHG sit on your skin for 1-2 minutes or as long as told. Rinse your entire body in the shower, including all body creases and folds. Turn off the shower. Dry off with a clean towel. Do not put anything on your skin afterward, such as powder, lotion, or perfume. Put on clean clothes or pajamas. If it's the night before surgery, sleep in clean sheets. General tips Use CHG only as told, and follow the instructions on the label. Use the full amount of CHG as told. This is often one bottle. Do not smoke and stay away from flames after using CHG. Your skin may feel sticky after using CHG. This is normal. The sticky feeling will go away as the CHG dries. Do not use CHG: If you have a chlorhexidine allergy or have reacted to chlorhexidine in the past. On open wounds or areas of skin that have broken skin, cuts, or scrapes. On babies younger than 29 months of age. Contact a health care provider if: You have questions about using CHG. Your skin gets irritated or itchy. You have a rash after using  CHG. You swallow any CHG. Call your local poison control center [phone] in the [place] Your eyes itch badly, or they become very red or swollen. Your hearing changes. You have trouble seeing. If you can't reach your provider, go to an urgent care or emergency room. Do not drive yourself. Get help right away if: You  have swelling or tingling in your mouth or throat. You make high-pitched whistling sounds when you breathe, most often when you breathe out (wheeze). You have trouble breathing. These symptoms may be an emergency. Call 911 right away. Do not wait to see if the symptoms will go away. Do not drive yourself to the hospital. This information is not intended to replace advice given to you by your health care provider. Make sure you discuss any questions you have with your health care provider. Document Revised: 08/31/2022 Document Reviewed: 08/27/2021 Elsevier Patient Education  2024 [employer].

## 2023-06-06 ENCOUNTER — Other Ambulatory Visit (HOSPITAL_COMMUNITY): Payer: Self-pay | Admitting: Podiatry

## 2023-06-06 ENCOUNTER — Ambulatory Visit (HOSPITAL_COMMUNITY)
Admission: RE | Admit: 2023-06-06 | Discharge: 2023-06-06 | Disposition: A | Discharge status: Home / Self Care | Source: Ambulatory Visit | Attending: Podiatry | Admitting: Podiatry

## 2023-06-06 ENCOUNTER — Encounter (HOSPITAL_COMMUNITY): Payer: Self-pay

## 2023-06-06 ENCOUNTER — Encounter (HOSPITAL_COMMUNITY)
Admission: RE | Admit: 2023-06-06 | Discharge: 2023-06-06 | Disposition: A | Discharge status: Home / Self Care | Source: Ambulatory Visit | Attending: Podiatry | Admitting: Podiatry

## 2023-06-06 DIAGNOSIS — S86002A Unspecified injury of left Achilles tendon, initial encounter: Secondary | ICD-10-CM | POA: Insufficient documentation

## 2023-06-06 DIAGNOSIS — M79672 Pain in left foot: Secondary | ICD-10-CM | POA: Diagnosis not present

## 2023-06-06 DIAGNOSIS — M7662 Achilles tendinitis, left leg: Secondary | ICD-10-CM | POA: Diagnosis not present

## 2023-06-06 DIAGNOSIS — M2012 Hallux valgus (acquired), left foot: Secondary | ICD-10-CM | POA: Diagnosis not present

## 2023-06-06 DIAGNOSIS — M9262 Juvenile osteochondrosis of tarsus, left ankle: Secondary | ICD-10-CM | POA: Diagnosis not present

## 2023-06-06 DIAGNOSIS — M19072 Primary osteoarthritis, left ankle and foot: Secondary | ICD-10-CM | POA: Diagnosis not present

## 2023-06-06 DIAGNOSIS — M7732 Calcaneal spur, left foot: Secondary | ICD-10-CM | POA: Diagnosis not present

## 2023-06-07 ENCOUNTER — Telehealth: Payer: Self-pay | Admitting: Cardiology

## 2023-06-07 ENCOUNTER — Ambulatory Visit: Attending: Emergency Medicine | Admitting: Emergency Medicine

## 2023-06-07 ENCOUNTER — Telehealth: Payer: Self-pay | Admitting: *Deleted

## 2023-06-07 DIAGNOSIS — Z01810 Encounter for preprocedural cardiovascular examination: Secondary | ICD-10-CM

## 2023-06-07 NOTE — Telephone Encounter (Signed)
 Pt called back and he has been scheduled tele preop appt 06/07/23 due to date request rcv'd and procedure date tomorrow 06/08/23. Med rec and consent are done.

## 2023-06-07 NOTE — Telephone Encounter (Signed)
   Pre-operative Risk Assessment    Patient Name: Erik Parker  DOB: [birth year] MRN: [id number]   Date of last office visit: 04/13/23 Date of next office visit: nothing scheduled due August 2025    Request for Surgical Clearance    Procedure:   Excision, Bone spur, Calcaneus,  Repair of tendon achilles with application of leg splint.   Date of Surgery:  Clearance 06/08/23                                Surgeon:  Dr. Allena Katz  Surgeon's Group or Practice Name:  [facility]  Phone number:  [phone] Fax number:  [phone] or [phone]   Type of Clearance Requested:   - Medical  - Pharmacy:  Hold Aspirin     Type of Anesthesia:  MAC   Additional requests/questions:    Alben Spittle   06/07/2023, 8:53 AM

## 2023-06-07 NOTE — Telephone Encounter (Signed)
   Name: Erik Parker  DOB: 03-23-1965  MRN: [id number]  Primary Cardiologist: Dina Rich, MD   Preoperative team, please contact this patient and set up a phone call appointment for further preoperative risk assessment. Please obtain consent and complete medication review. Thank you for your help.  I confirm that guidance regarding antiplatelet and oral anticoagulation therapy has been completed and, if necessary, noted below.  Aspirin not prescribed by Cardiology. Aspirin is prescribed by Neurology. Please reach out to prescribing office for recommendations on holding Aspirin.   I also confirmed the patient resides in the state of [place]. As per [facility] Medical Board telemedicine laws, the patient must reside in the state in which the provider is licensed.   Denyce Robert, NP 06/07/2023, 9:48 AM [facility]

## 2023-06-07 NOTE — Progress Notes (Addendum)
 Virtual Visit via Telephone Note   Because of MATH BRAZIE co-morbid illnesses, he is at least at moderate risk for complications without adequate follow up.  This format is felt to be most appropriate for this patient at this time.  Due to technical limitations with video connection (technology), today's appointment will be conducted as an audio only telehealth visit, and Erik Parker verbally agreed to proceed in this manner.   All issues noted in this document were discussed and addressed.  No physical exam could be performed with this format.  Evaluation Performed:  Preoperative cardiovascular risk assessment _____________   Date:  06/07/2023   Patient ID:  Erik Parker, DOB 06/24/1965, MRN [id number] Patient Location:  Home Provider location:   Office  Primary Care Provider:  Assunta Found, MD Primary Cardiologist:  Dina Rich, MD  Chief Complaint / Patient Profile   58 y.o. y/o male with a h/o monomorphic VT/NSVT, aortic dissection s/p repair followed by CT surgery, CVA, hyperlipidemia, hypertension who is pending excision, bone spur, calcaneus,  repair of tendon achilles with application of leg splint with [facility] by Dr. Allena Katz on 06/08/2023 and presents today for telephonic preoperative cardiovascular risk assessment.  History of Present Illness    Erik Parker is a 58 y.o. male who presents via audio/video conferencing for a telehealth visit today.  Pt was last seen in cardiology clinic on 04/13/2023 by Dr. Wyline Mood.  At that time BENGIE KAUCHER was doing well.  The patient is now pending procedure as outlined above. Since his last visit, he  denies chest pain, shortness of breath, lower extremity edema, fatigue, palpitations, melena, hematuria, hemoptysis, diaphoresis, weakness, presyncope, syncope, orthopnea, and PND. Patient is doing well without cardiovascular concerns or complaints. He remains active without anginal symptoms.   Past Medical History    Past  Medical History:  Diagnosis Date   Cardiomyopathy (HCC)    a. 04/2019 TEE: EF 50%; b. 04/2019 Echo: EF 45-50%, glob HK, paradoxical septal motion, mild conc LVH, mildly reduced RV fxn, 28mm tube graft seen in Asc Ao w/ intact anastomoses.   Hx of repair of dissecting thoracic aortic aneurysm, Stanford type A    a. 04/2019 s/p repair. Complicated by CVA (bilat carotid dissections) and pericarditis.   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 04/2019 following aortic dissection repair/post-op pericarditis.   Stroke [facility])    Past Surgical History:  Procedure Laterality Date   ACHILLES TENDON SURGERY Right 11/10/2016   Procedure: ACHILLES TENDON REPAIR VIA SPEED BRIDGE;  Surgeon: [name], DPM;  Location: [facility];  Service: Podiatry;  Laterality: Right;   HEEL SPUR RESECTION Right 11/10/2016   Procedure: HEEL SPUR RESECTION (HAGLUNDS DEFORMITY);  Surgeon: [name], DPM;  Location: [facility];  Service: Podiatry;  Laterality: Right;   REPAIR OF ACUTE ASCENDING THORACIC AORTIC DISSECTION N/A 05/01/2019   Procedure: REPAIR OF ACUTE ASCENDING THORACIC AORTIC DISSECTION USING HEMOSHIELD GRAFT SIZE ;  Surgeon: Linden Dolin, MD;  Location: [facility];  Service: Cardiothoracic;  Laterality: N/A;    Allergies  No Known Allergies  Home Medications    Prior to Admission medications   Medication Sig Start Date End Date Taking? Authorizing Provider  amLODipine (NORVASC) 10 MG tablet TAKE 1 TABLET BY MOUTH DAILY 02/14/23   Antoine Poche, MD  aspirin EC 325 MG EC tablet Take 1 tablet (325 mg total) by mouth daily. 05/09/19  Leary Roca, PA-C  atorvastatin (LIPITOR) 20 MG tablet Take 1 tablet (20 mg total) by mouth daily. 04/20/22   Sharlene Dory, NP  ezetimibe (ZETIA) 10 MG tablet Take 1 tablet (10 mg total) by mouth daily. 04/20/22   Sharlene Dory, NP  metoprolol tartrate (LOPRESSOR) 25 MG tablet TAKE 1 TABLET BY MOUTH TWICE A  DAY 08/10/21   Antoine Poche, MD  Multiple Vitamin (MULTIVITAMIN WITH MINERALS) TABS tablet Take 1 tablet by mouth daily.    [provider]    Physical Exam    Vital Signs:  Leonides Grills does not have vital signs available for review today.  Given telephonic nature of communication, physical exam is limited. AAOx3. NAD. Normal affect.  Speech and respirations are unlabored.  Accessory Clinical Findings    None  Assessment & Plan    1.  Preoperative Cardiovascular Risk Assessment: According to the Revised Cardiac Risk Index (RCRI), his Perioperative Risk of Major Cardiac Event is (%): 0.9. His Functional Capacity in METs is: 7.99 according to the Duke Activity Status Index (DASI). Therefore, based on ACC/AHA guidelines, patient would be at acceptable risk for the planned procedure without further cardiovascular testing.  The patient was advised that if he develops new symptoms prior to surgery to contact our office to arrange for a follow-up visit, and he verbalized understanding.  Aspirin is not prescribed by Cardiology. Aspirin is prescribed by Neurology. Please reach out to prescribing office for recommendations on holding Aspirin.   A copy of this note will be routed to requesting surgeon.  Time:   Today, I have spent 6 minutes with the patient with telehealth technology discussing medical history, symptoms, and management plan.     Denyce Robert, NP  06/07/2023, 12:08 PM

## 2023-06-07 NOTE — Telephone Encounter (Signed)
 Left message for the pt that he is going to need a tele appt today as an add on. Our office just received the request today for surgery tomorrow. I will send FYI note to the surgeon's office in regard to the ASA not managed by cardiology; as well as the pt needs a tele appt today.

## 2023-06-07 NOTE — Telephone Encounter (Signed)
 Pt called back and he has been scheduled tele preop appt 06/07/23 due to date request rcv'd and procedure date tomorrow 06/08/23. Med rec and consent are done.       Patient Consent for Virtual Visit        Erik Parker has provided verbal consent on 06/07/2023 for a virtual visit (video or telephone).   CONSENT FOR VIRTUAL VISIT FOR:  Erik Parker  By participating in this virtual visit I agree to the following:  I hereby voluntarily request, consent and authorize [facility] and its employed or contracted physicians, physician assistants, nurse practitioners or other licensed health care professionals (the Practitioner), to provide me with telemedicine health care services (the "Services") as deemed necessary by the treating Practitioner. I acknowledge and consent to receive the Services by the Practitioner via telemedicine. I understand that the telemedicine visit will involve communicating with the Practitioner through live audiovisual communication technology and the disclosure of certain medical information by electronic transmission. I acknowledge that I have been given the opportunity to request an in-person assessment or other available alternative prior to the telemedicine visit and am voluntarily participating in the telemedicine visit.  I understand that I have the right to withhold or withdraw my consent to the use of telemedicine in the course of my care at any time, without affecting my right to future care or treatment, and that the Practitioner or I may terminate the telemedicine visit at any time. I understand that I have the right to inspect all information obtained and/or recorded in the course of the telemedicine visit and may receive copies of available information for a reasonable fee.  I understand that some of the potential risks of receiving the Services via telemedicine include:  Delay or interruption in medical evaluation due to technological equipment failure or  disruption; Information transmitted may not be sufficient (e.g. poor resolution of images) to allow for appropriate medical decision making by the Practitioner; and/or  In rare instances, security protocols could fail, causing a breach of personal health information.  Furthermore, I acknowledge that it is my responsibility to provide information about my medical history, conditions and care that is complete and accurate to the best of my ability. I acknowledge that Practitioner's advice, recommendations, and/or decision may be based on factors not within their control, such as incomplete or inaccurate data provided by me or distortions of diagnostic images or specimens that may result from electronic transmissions. I understand that the practice of medicine is not an exact science and that Practitioner makes no warranties or guarantees regarding treatment outcomes. I acknowledge that a copy of this consent can be made available to me via my patient portal [facility] MyChart), or I can request a printed copy by calling the office of [facility].    I understand that my insurance will be billed for this visit.   I have read or had this consent read to me. I understand the contents of this consent, which adequately explains the benefits and risks of the Services being provided via telemedicine.  I have been provided ample opportunity to ask questions regarding this consent and the Services and have had my questions answered to my satisfaction. I give my informed consent for the services to be provided through the use of telemedicine in my medical care

## 2023-06-08 ENCOUNTER — Other Ambulatory Visit: Payer: Self-pay

## 2023-06-08 ENCOUNTER — Ambulatory Visit (HOSPITAL_COMMUNITY): Payer: Self-pay | Admitting: Certified Registered"

## 2023-06-08 ENCOUNTER — Ambulatory Visit (HOSPITAL_COMMUNITY)
Admission: RE | Admit: 2023-06-08 | Discharge: 2023-06-08 | Disposition: A | Discharge status: Home / Self Care | Attending: Podiatry | Admitting: Podiatry

## 2023-06-08 ENCOUNTER — Encounter (HOSPITAL_COMMUNITY): Payer: Self-pay

## 2023-06-08 ENCOUNTER — Ambulatory Visit (HOSPITAL_COMMUNITY)

## 2023-06-08 ENCOUNTER — Encounter (HOSPITAL_COMMUNITY)
Admission: RE | Disposition: A | Discharge status: Home / Self Care | Payer: Self-pay | Source: Home / Self Care | Attending: Podiatry

## 2023-06-08 DIAGNOSIS — M7662 Achilles tendinitis, left leg: Secondary | ICD-10-CM | POA: Diagnosis not present

## 2023-06-08 DIAGNOSIS — S86012A Strain of left Achilles tendon, initial encounter: Secondary | ICD-10-CM | POA: Insufficient documentation

## 2023-06-08 DIAGNOSIS — M7732 Calcaneal spur, left foot: Secondary | ICD-10-CM | POA: Insufficient documentation

## 2023-06-08 DIAGNOSIS — M79672 Pain in left foot: Secondary | ICD-10-CM | POA: Diagnosis not present

## 2023-06-08 DIAGNOSIS — Z87891 Personal history of nicotine dependence: Secondary | ICD-10-CM | POA: Diagnosis not present

## 2023-06-08 DIAGNOSIS — M9262 Juvenile osteochondrosis of tarsus, left ankle: Secondary | ICD-10-CM | POA: Diagnosis not present

## 2023-06-08 DIAGNOSIS — I10 Essential (primary) hypertension: Secondary | ICD-10-CM | POA: Diagnosis not present

## 2023-06-08 DIAGNOSIS — M779 Enthesopathy, unspecified: Secondary | ICD-10-CM | POA: Diagnosis not present

## 2023-06-08 DIAGNOSIS — X58XXXA Exposure to other specified factors, initial encounter: Secondary | ICD-10-CM | POA: Insufficient documentation

## 2023-06-08 DIAGNOSIS — Z9889 Other specified postprocedural states: Secondary | ICD-10-CM | POA: Diagnosis not present

## 2023-06-08 HISTORY — PX: HEEL SPUR RESECTION: SHX6410

## 2023-06-08 HISTORY — PX: ACHILLES TENDON SURGERY: SHX542

## 2023-06-08 SURGERY — EXCISION, BONE SPUR, CALCANEUS
Anesthesia: General | Site: Heel | Laterality: Left

## 2023-06-08 MED ORDER — SEVOFLURANE IN SOLN
RESPIRATORY_TRACT | Status: AC
  Filled 2023-06-08: qty 250

## 2023-06-08 MED ORDER — ORAL CARE MOUTH RINSE: 15.0000 mL | Freq: Once | OROMUCOSAL | Status: AC

## 2023-06-08 MED ORDER — OXYCODONE HCL 5 MG PO TABS: 5.0000 mg | ORAL_TABLET | Freq: Once | ORAL | Status: DC | PRN

## 2023-06-08 MED ORDER — LIDOCAINE HCL (PF) 2 % IJ SOLN
INTRAMUSCULAR | Status: DC | PRN
  Administered 2023-06-08: 100 mg via INTRADERMAL

## 2023-06-08 MED ORDER — ACETAMINOPHEN 10 MG/ML IV SOLN
INTRAVENOUS | Status: DC | PRN
  Administered 2023-06-08: 1000 mg via INTRAVENOUS

## 2023-06-08 MED ORDER — CHLORHEXIDINE GLUCONATE CLOTH 2 % EX PADS
6.0000 | MEDICATED_PAD | Freq: Once | CUTANEOUS | Status: DC
Start: 2023-06-08 — End: 2023-06-08

## 2023-06-08 MED ORDER — FENTANYL CITRATE PF 50 MCG/ML IJ SOSY: 25.0000 ug | PREFILLED_SYRINGE | INTRAMUSCULAR | Status: DC | PRN

## 2023-06-08 MED ORDER — SUGAMMADEX SODIUM 200 MG/2ML IV SOLN
INTRAVENOUS | Status: DC | PRN
Start: 2023-06-08 — End: 2023-06-08
  Administered 2023-06-08: 200 mg via INTRAVENOUS

## 2023-06-08 MED ORDER — PHENYLEPHRINE HCL-NACL 20-0.9 MG/250ML-% IV SOLN
INTRAVENOUS | Status: AC
  Filled 2023-06-08: qty 250

## 2023-06-08 MED ORDER — EPHEDRINE 5 MG/ML INJ
INTRAVENOUS | Status: AC
  Filled 2023-06-08: qty 5

## 2023-06-08 MED ORDER — DEXMEDETOMIDINE HCL IN NACL 80 MCG/20ML IV SOLN
INTRAVENOUS | Status: DC | PRN
Start: 2023-06-08 — End: 2023-06-08
  Administered 2023-06-08 (×5): 8 ug via INTRAVENOUS

## 2023-06-08 MED ORDER — BUPIVACAINE HCL (PF) 0.5 % IJ SOLN
INTRAMUSCULAR | Status: DC | PRN
  Administered 2023-06-08: 9 mL

## 2023-06-08 MED ORDER — CHLORHEXIDINE GLUCONATE 0.12 % MT SOLN
15.0000 mL | Freq: Once | OROMUCOSAL | Status: AC
  Administered 2023-06-08: 15 mL via OROMUCOSAL

## 2023-06-08 MED ORDER — CLINDAMYCIN PHOSPHATE 900 MG/50ML IV SOLN: 900.0000 mg | Freq: Once | INTRAVENOUS | Status: DC

## 2023-06-08 MED ORDER — DEXAMETHASONE SODIUM PHOSPHATE 10 MG/ML IJ SOLN
INTRAMUSCULAR | Status: DC | PRN
  Administered 2023-06-08: 5 mg via INTRAVENOUS

## 2023-06-08 MED ORDER — FENTANYL CITRATE (PF) 100 MCG/2ML IJ SOLN
INTRAMUSCULAR | Status: AC
  Filled 2023-06-08: qty 2

## 2023-06-08 MED ORDER — DEXAMETHASONE SODIUM PHOSPHATE 10 MG/ML IJ SOLN
INTRAMUSCULAR | Status: AC
  Filled 2023-06-08: qty 1

## 2023-06-08 MED ORDER — CLINDAMYCIN PHOSPHATE 600 MG/50ML IV SOLN
INTRAVENOUS | Status: AC
  Filled 2023-06-08: qty 50

## 2023-06-08 MED ORDER — ROCURONIUM BROMIDE 10 MG/ML (PF) SYRINGE
PREFILLED_SYRINGE | INTRAVENOUS | Status: DC | PRN
  Administered 2023-06-08: 20 mg via INTRAVENOUS
  Administered 2023-06-08: 60 mg via INTRAVENOUS

## 2023-06-08 MED ORDER — MIDAZOLAM HCL 2 MG/2ML IJ SOLN
INTRAMUSCULAR | Status: AC
  Filled 2023-06-08: qty 2

## 2023-06-08 MED ORDER — EPHEDRINE SULFATE-NACL 50-0.9 MG/10ML-% IV SOSY
PREFILLED_SYRINGE | INTRAVENOUS | Status: DC | PRN
  Administered 2023-06-08 (×2): 5 mg via INTRAVENOUS

## 2023-06-08 MED ORDER — PROPOFOL 10 MG/ML IV BOLUS
INTRAVENOUS | Status: DC | PRN
  Administered 2023-06-08: 150 mg via INTRAVENOUS

## 2023-06-08 MED ORDER — PHENYLEPHRINE 80 MCG/ML (10ML) SYRINGE FOR IV PUSH (FOR BLOOD PRESSURE SUPPORT)
PREFILLED_SYRINGE | INTRAVENOUS | Status: AC
  Filled 2023-06-08: qty 10

## 2023-06-08 MED ORDER — GLYCOPYRROLATE PF 0.2 MG/ML IJ SOSY
PREFILLED_SYRINGE | INTRAMUSCULAR | Status: DC | PRN
Start: 2023-06-08 — End: 2023-06-08
  Administered 2023-06-08: .2 mg via INTRAVENOUS

## 2023-06-08 MED ORDER — LACTATED RINGERS IV SOLN: INTRAVENOUS | Status: DC

## 2023-06-08 MED ORDER — PROPOFOL 10 MG/ML IV BOLUS
INTRAVENOUS | Status: AC
  Filled 2023-06-08: qty 20

## 2023-06-08 MED ORDER — BUPIVACAINE HCL (PF) 0.5 % IJ SOLN
INTRAMUSCULAR | Status: AC
  Filled 2023-06-08: qty 30

## 2023-06-08 MED ORDER — ONDANSETRON HCL 4 MG/2ML IJ SOLN
INTRAMUSCULAR | Status: DC | PRN
  Administered 2023-06-08: 4 mg via INTRAVENOUS

## 2023-06-08 MED ORDER — 0.9 % SODIUM CHLORIDE (POUR BTL) OPTIME
TOPICAL | Status: DC | PRN
  Administered 2023-06-08: 1000 mL

## 2023-06-08 MED ORDER — CLINDAMYCIN PHOSPHATE 600 MG/50ML IV SOLN
600.0000 mg | Freq: Once | INTRAVENOUS | Status: AC
  Administered 2023-06-08: 600 mg via INTRAVENOUS

## 2023-06-08 MED ORDER — CLINDAMYCIN PHOSPHATE 900 MG/50ML IV SOLN: 900.0000 mg | INTRAVENOUS | Status: DC

## 2023-06-08 MED ORDER — SUGAMMADEX SODIUM 200 MG/2ML IV SOLN
INTRAVENOUS | Status: AC
  Filled 2023-06-08: qty 2

## 2023-06-08 MED ORDER — ACETAMINOPHEN 10 MG/ML IV SOLN
INTRAVENOUS | Status: AC
  Filled 2023-06-08: qty 100

## 2023-06-08 MED ORDER — DEXMEDETOMIDINE HCL IN NACL 80 MCG/20ML IV SOLN
INTRAVENOUS | Status: AC
  Filled 2023-06-08: qty 20

## 2023-06-08 MED ORDER — KETOROLAC TROMETHAMINE 30 MG/ML IJ SOLN
INTRAMUSCULAR | Status: DC | PRN
  Administered 2023-06-08: 30 mg via INTRAVENOUS

## 2023-06-08 MED ORDER — OXYCODONE HCL 5 MG/5ML PO SOLN: 5.0000 mg | Freq: Once | ORAL | Status: DC | PRN

## 2023-06-08 MED ORDER — ROCURONIUM BROMIDE 10 MG/ML (PF) SYRINGE
PREFILLED_SYRINGE | INTRAVENOUS | Status: AC
  Filled 2023-06-08: qty 10

## 2023-06-08 MED ORDER — ONDANSETRON HCL 4 MG/2ML IJ SOLN
INTRAMUSCULAR | Status: AC
  Filled 2023-06-08: qty 2

## 2023-06-08 MED ORDER — ONDANSETRON HCL 4 MG/2ML IJ SOLN: 4.0000 mg | Freq: Once | INTRAMUSCULAR | Status: DC | PRN

## 2023-06-08 MED ORDER — LIDOCAINE HCL (PF) 2 % IJ SOLN
INTRAMUSCULAR | Status: AC
  Filled 2023-06-08: qty 5

## 2023-06-08 MED ORDER — CHLORHEXIDINE GLUCONATE CLOTH 2 % EX PADS: 6.0000 | MEDICATED_PAD | Freq: Once | CUTANEOUS | Status: DC

## 2023-06-08 MED ORDER — LIDOCAINE HCL (PF) 1 % IJ SOLN
INTRAMUSCULAR | Status: AC
  Filled 2023-06-08: qty 30

## 2023-06-08 MED ORDER — FENTANYL CITRATE (PF) 250 MCG/5ML IJ SOLN
INTRAMUSCULAR | Status: DC | PRN
  Administered 2023-06-08 (×7): 50 ug via INTRAVENOUS

## 2023-06-08 MED ORDER — GLYCOPYRROLATE PF 0.2 MG/ML IJ SOSY
PREFILLED_SYRINGE | INTRAMUSCULAR | Status: AC
  Filled 2023-06-08: qty 1

## 2023-06-08 MED ORDER — PHENYLEPHRINE 80 MCG/ML (10ML) SYRINGE FOR IV PUSH (FOR BLOOD PRESSURE SUPPORT)
PREFILLED_SYRINGE | INTRAVENOUS | Status: DC | PRN
  Administered 2023-06-08: 80 ug via INTRAVENOUS

## 2023-06-08 MED ORDER — MIDAZOLAM HCL 2 MG/2ML IJ SOLN
INTRAMUSCULAR | Status: DC | PRN
Start: 2023-06-08 — End: 2023-06-08
  Administered 2023-06-08: 2 mg via INTRAVENOUS

## 2023-06-08 MED ORDER — FENTANYL CITRATE (PF) 250 MCG/5ML IJ SOLN
INTRAMUSCULAR | Status: AC
  Filled 2023-06-08: qty 5

## 2023-06-08 SURGICAL SUPPLY — 43 items
BANDAGE ESMARK 4X12 BL STRL LF (DISPOSABLE) ×1 IMPLANT
BLADE AVERAGE 25X9 (BLADE) ×1 IMPLANT
BLADE SURG 15 STRL LF DISP TIS (BLADE) ×2 IMPLANT
BNDG ELASTIC 4X5.8 VLCR NS LF (GAUZE/BANDAGES/DRESSINGS) ×1 IMPLANT
BNDG ESMARK 4X12 BLUE STRL LF (DISPOSABLE) ×1 IMPLANT
BNDG GAUZE DERMACEA FLUFF 4 (GAUZE/BANDAGES/DRESSINGS) IMPLANT
BOOT STEPPER DURA XLG (SOFTGOODS) IMPLANT
CHLORAPREP W/TINT 26 (MISCELLANEOUS) ×1 IMPLANT
CLOTH BEACON ORANGE TIMEOUT ST (SAFETY) ×1 IMPLANT
COVER LIGHT HANDLE STERIS (MISCELLANEOUS) ×2 IMPLANT
CUFF TRNQT CYL 34X4.125X (TOURNIQUET CUFF) ×1 IMPLANT
DECANTER SPIKE VIAL GLASS SM (MISCELLANEOUS) ×2 IMPLANT
DRAPE HALF SHEET 40X57 (DRAPES) IMPLANT
DRSG ADAPTIC 3X8 NADH LF (GAUZE/BANDAGES/DRESSINGS) ×1 IMPLANT
ELECT REM PT RETURN 9FT ADLT (ELECTROSURGICAL) ×1 IMPLANT
ELECTRODE REM PT RTRN 9FT ADLT (ELECTROSURGICAL) ×1 IMPLANT
GAUZE SPONGE 4X4 12PLY STRL (GAUZE/BANDAGES/DRESSINGS) ×1 IMPLANT
GLOVE BIO SURGEON STRL SZ7 (GLOVE) IMPLANT
GLOVE BIOGEL PI IND STRL 7.0 (GLOVE) ×2 IMPLANT
GLOVE BIOGEL PI IND STRL 7.5 (GLOVE) ×1 IMPLANT
GLOVE ECLIPSE 7.0 STRL STRAW (GLOVE) ×1 IMPLANT
GOWN STRL REUS W/ TWL LRG LVL3 (GOWN DISPOSABLE) ×1 IMPLANT
GOWN STRL REUS W/TWL LRG LVL3 (GOWN DISPOSABLE) ×2 IMPLANT
IMPL SYS BIOCOMP ACH SPEED (Anchor) IMPLANT
IMPLANT SYS BIOCOMP ACH SPEED (Anchor) ×1 IMPLANT
KIT TURNOVER KIT A (KITS) ×1 IMPLANT
MANIFOLD NEPTUNE II (INSTRUMENTS) ×1 IMPLANT
NDL HYPO 25X1 1.5 SAFETY (NEEDLE) IMPLANT
NDL MAYO 6 CRC TAPER PT (NEEDLE) ×1 IMPLANT
NEEDLE HYPO 25X1 1.5 SAFETY (NEEDLE) ×1 IMPLANT
NEEDLE MAYO 6 CRC TAPER PT (NEEDLE) ×1 IMPLANT
NS IRRIG 1000ML POUR BTL (IV SOLUTION) ×1 IMPLANT
PACK BASIC LIMB (CUSTOM PROCEDURE TRAY) ×1 IMPLANT
PAD ARMBOARD POSITIONER FOAM (MISCELLANEOUS) ×1 IMPLANT
POSITIONER FOAM HEAD TRAC HOLE (MISCELLANEOUS) ×1 IMPLANT
RASP SM TEAR CROSS CUT (RASP) ×1 IMPLANT
SET BASIN LINEN APH (SET/KITS/TRAYS/PACK) ×1 IMPLANT
SPLINT CAST 1 STEP 5X30 WHT (MISCELLANEOUS) IMPLANT
SPONGE T-LAP 18X18 ~~LOC~~+RFID (SPONGE) IMPLANT
STAPLER VISISTAT (STAPLE) IMPLANT
SUT VIC AB 2-0 CT2 27 (SUTURE) ×1 IMPLANT
SUT VIC AB 5-0 P-3 18X BRD (SUTURE) IMPLANT
SYR CONTROL 10ML LL (SYRINGE) ×2 IMPLANT

## 2023-06-08 NOTE — Brief Op Note (Signed)
 06/08/2023  11:30 AM  PATIENT:  Erik Parker  58 y.o. male  PRE-OPERATIVE DIAGNOSIS:  LEFT ACHILLES TENDON TEAR, LEFT RETRO CALCANIAL HEEL SPUR  POST-OPERATIVE DIAGNOSIS:  LEFT ACHILLES TENDON TEAR, LEFT RETRO CALCANIAL HEEL SPUR  PROCEDURE:  Procedure(s) with comments: EXCISION, BONE SPUR, CALCANEUS (Left) REPAIR, TENDON, ACHILLES (Left) - WITH APPLICATION OF LEG SPLINT  SURGEON:  Surgeons and Role:    * [name], DPM - Primary  PHYSICIAN ASSISTANT:   ASSISTANTS: none   ANESTHESIA:   general  EBL: None.   BLOOD ADMINISTERED:none  DRAINS: none   LOCAL MEDICATIONS USED:  MARCAINE    and Amount: 9 ml post op  SPECIMEN:  No Specimen  DISPOSITION OF SPECIMEN:  N/A  COUNTS:  YES  TOURNIQUET:   Total Tourniquet Time Documented: Thigh (Left) - 120 minutes Total: Thigh (Left) - 120 minutes   DICTATION: .Reubin Milan Dictation  PLAN OF CARE: Discharge to home after PACU  PATIENT DISPOSITION:  PACU - hemodynamically stable.   Delay start of Pharmacological VTE agent (>24hrs) due to surgical blood loss or risk of bleeding: yes

## 2023-06-08 NOTE — H&P (Signed)
.  HISTORY AND PHYSICAL INTERVAL NOTE:  06/08/2023  8:04 AM  Erik Parker  has presented today for surgery, with the diagnosis of LEFT ACHILLES TENDON TEAR LEFT RETRO CALCANIAL HEEL SPURR.  The various methods of treatment have been discussed with the patient.  No guarantees were given.  After consideration of risks, benefits and other options for treatment, the patient has consented to surgery.  I have reviewed the patients' chart and labs.    Patient Vitals for the past 24 hrs:  BP Temp Temp src Pulse Resp SpO2 Height Weight  06/08/23 0712 134/76 97.9 F (36.6 C) Oral (!) 56 15 98 % 6\' 1"  (1.854 m) 122.5 kg    A history and physical examination was performed in my office.  The patient was reexamined.  There have been no changes to this history and physical examination.  [name], DPM

## 2023-06-08 NOTE — Discharge Instructions (Addendum)
 These instructions will give you an idea of what to expect after surgery and how to manage issues that may arise before your first post op office visit.  Pain Management Pain is best managed by "staying ahead" of it. If pain gets out of control, it is difficult to get it back under control. Local anesthesia that lasts 6-8 hours is used to numb the foot and decrease pain.  For the best pain control, take the pain medication every 4 hours for the first 2 days post op. On the third day pain medication can be taken as needed.   Post Op Nausea Nausea is common after surgery, so it is managed proactively.  If prescribed, use the prescribed nausea medication regularly for the first 2 days post op.  Bandages Do not worry if there is blood on the bandage. What looks like a lot of blood on the bandage is actually a small amount. Blood on the dressing spreads out as it is absorbed by the gauze, the same way a drop of water spreads out on a paper towel.  If the bandages feel wet or dry, stiff and uncomfortable, call the office during office hours and we will schedule a time for you to have the bandage changed.  Unless you are specifically told otherwise, we will do the first bandage change in the office.  Keep your bandage dry. If the bandage becomes wet or soiled, notify the office and we will schedule a time to change the bandage.  Activity It is best to spend most of the first 2 days after surgery lying down with the foot elevated above the level of your heart. You may put weight on your heel while wearing the surgical shoe.   You may only get up to go to the restroom.  Driving Do not drive until you are able to respond in an emergency (i.e. slam on the brakes). This usually occurs after the bone has healed - 6 to 8 weeks.  Call the Office If you have a fever over 101F.  If you have increasing pain after the initial post op pain has settled down.  If you have increasing redness, swelling, or  drainage.  If you have any questions or concerns.

## 2023-06-08 NOTE — Anesthesia Preprocedure Evaluation (Addendum)
 Anesthesia Evaluation  Patient identified by MRN, date of birth, ID band Patient awake    Reviewed: Allergy & Precautions, H&P , NPO status , Patient's Chart, lab work & pertinent test results, reviewed documented beta blocker date and time   Airway Mallampati: II  TM Distance: >3 FB Neck ROM: full    Dental no notable dental hx.    Pulmonary former smoker   Pulmonary exam normal breath sounds clear to auscultation       Cardiovascular Exercise Tolerance: Good hypertension,  Rhythm:regular Rate:Normal     Neuro/Psych CVA  negative psych ROS   GI/Hepatic negative GI ROS, Neg liver ROS,,,  Endo/Other  negative endocrine ROS    Renal/GU negative Renal ROS  negative genitourinary   Musculoskeletal   Abdominal   Peds  Hematology negative hematology ROS (+)   Anesthesia Other Findings   Reproductive/Obstetrics negative OB ROS                             Anesthesia Physical Anesthesia Plan  ASA: 3  Anesthesia Plan: General and General LMA   Post-op Pain Management:    Induction:   PONV Risk Score and Plan: Ondansetron  Airway Management Planned:   Additional Equipment:   Intra-op Plan:   Post-operative Plan:   Informed Consent: I have reviewed the patients History and Physical, chart, labs and discussed the procedure including the risks, benefits and alternatives for the proposed anesthesia with the patient or authorized representative who has indicated his/her understanding and acceptance.     Dental Advisory Given  Plan Discussed with: CRNA  Anesthesia Plan Comments:        Anesthesia Quick Evaluation

## 2023-06-08 NOTE — Anesthesia Procedure Notes (Signed)
 Procedure Name: Intubation Date/Time: 06/08/2023 8:40 AM  Performed by: Julian Reil, CRNAPre-anesthesia Checklist: Patient identified, Emergency Drugs available, Suction available and Patient being monitored Patient Re-evaluated:Patient Re-evaluated prior to induction Oxygen Delivery Method: Circle system utilized Preoxygenation: Pre-oxygenation with 100% oxygen Induction Type: IV induction Ventilation: Mask ventilation without difficulty and Oral airway inserted - appropriate to patient size Laryngoscope Size: Mac and 4 Grade View: Grade II Tube type: Oral Tube size: 7.5 mm Number of attempts: 1 Airway Equipment and Method: Stylet and Oral airway Placement Confirmation: ETT inserted through vocal cords under direct vision, positive ETCO2 and breath sounds checked- equal and bilateral Secured at: 24 cm Tube secured with: Tape Dental Injury: Teeth and Oropharynx as per pre-operative assessment  Comments: Large tongue and tonsils noted with DL.

## 2023-06-08 NOTE — Op Note (Signed)
 06/08/2023  11:30 AM  PATIENT:  Erik Parker  58 y.o. male  PRE-OPERATIVE DIAGNOSIS:  LEFT ACHILLES TENDON TEAR, LEFT RETRO CALCANIAL HEEL SPUR  POST-OPERATIVE DIAGNOSIS:  LEFT ACHILLES TENDON TEAR, LEFT RETRO CALCANIAL HEEL SPUR  PROCEDURE:  Procedure(s) with comments: EXCISION, BONE SPUR, CALCANEUS (Left) REPAIR, TENDON, ACHILLES (Left) - WITH APPLICATION OF LEG SPLINT  SURGEON:  Surgeons and Role:    * [name], DPM - Primary  PHYSICIAN ASSISTANT:   ASSISTANTS: none   ANESTHESIA:   general  EBL: None.   Materials: [employer] speed bridge, 4 x 30 posterior splint.   LOCAL MEDICATIONS USED:  MARCAINE    and Amount: 9 ml post op  SPECIMEN:  No Specimen  DISPOSITION OF SPECIMEN:  N/A  TOURNIQUET:   Total Tourniquet Time Documented: Thigh (Left) - 120 minutes Total: Thigh (Left) - 120 minutes    Patient was brought into the operating room. Following IV sedation and General anesthesia patient was laid prone on to the operating table. Care was made to pad all the bony prominence.  Thigh tourniquet was applied to the left surgical extremity. The left foot and ankle were the prepped upto the knee, scrubbed and draped in aseptic manner. Using an esmarch band the tourniquet on left the surgical site was inflatted at 350 mmHG.    Attention was directed toward left posterior heel. There was prominent bump visualized with skin discoloration at the posterior superior aspect of the calcaneus. A midline incision approach was planned. A 6 cm midline incision to achilles tendon was made using #15 blade down to skin and subcutaneous tissue with full flap at the inferior aspect of the incision. Care was made to preserve the paratenon. Next the paratenon was dissected off carefully from achilles tendon. There was fragments of bony exostosis felt with the tendon and most inferior part of the incision. The achilles tendon was now visualized and thickened at the most inferior part. Achilles  tendon was split in midline to the incision in full thickness, from posterior to anterior.   Upon inspection of the tendon, the tendon was thickened and tears were noted within distal segment of the tendon. Calcification within tendon noted. All tendinopathic tissue was removed from the tendon and around the tendon.  Achilles tendon distally was now reflected medially and laterally and expose the whole calcaneal tuberosity with a Haglund's prominence. Care was maintained to save medial  and lateral attachments to assist with the accurate restoration of the Achilles' length. The bursa was inflamed and red and removed using sharp dissesction.  At this time the Haglund's prominence using a microsagittal saw. Power rasp was used to rasp down any bony prominence. Fluoroscopy was used to see if any bony prominent left. At this time surgical site was flushed with normal saline.   At this bone was ready to insert the speedbride and reattach the achilles tendon.  Two (2) 4.75 mm BioComposite SwiveLock anchors were drilled about 1cm apart from each other using drill guide from the set. The 2 holes were about 1 cm proximal to the distal insertion of the Achilles tendon and central to each half of the tendon. Then 4.75 mm SwiveLock tap was used to prepare the holes for the 4.75 mm SwiveLock anchors. Inserted two (2) 4.75 mm BioComposite SwiveLock anchors loaded with FiberTape suture, 1 blue and 1 white/black, into the proximal holes. Anchors were noted to be flushed with bone. The anchor driver was then removed and needle attached to the 2mm  fiber tape were sutured through the achilles tendon on each side. At this time the distal holes were drilled with 3.5 mm drill in the same manner as the proximal holes. Holes were tapped using 4.74mm SwiveLock anchors. After cutting the swedged portion on each proximal anchor, retrieved 1 FiberTape suture tail from each proximal anchor (1 blue and 1 white/black) and preloaded them  through the distal SwiveLock anchor eyelet. Tension of the FiberTape suture was adjusted and inserted the 4.75 mm SwiveLock anchor into the prepared distal bone socket until the anchor body contacts bone. Anchors were flushed to the bone. The tails on the distal row were cut and anchor was flushed to the bone. Fluoroscopy was used to confirm the position of the anchors. Tendon was firmly attached and strength was tested on the table. Remaining achilles tendon was suture using 2-0 fiber tape from the speedbridge set. The paratenon was closed using 2-0 Vicryl in running fashion. Sub cuticular stiches were done to close the skin using 3-0 Vicryl. Tourniquet was deflated. Staples were also used for the skin. 9cc of ml0.5%marcaine was injected into the surgical site. Dry sterile dressing was applied with jump start directly on the skin. Fibreglass posterior splint was applied to the  left surgical extremity in plantar flexion position. Capillary refill was immediate to all the toes. Patient was extubated and transferred to PACU. Vitals are stable. Patient to be nonweightbearing for 3 weeks and using crutches on surgical extremity.

## 2023-06-08 NOTE — Transfer of Care (Signed)
 Immediate Anesthesia Transfer of Care Note  Patient: Erik Parker  Procedure(s) Performed: EXCISION, BONE SPUR, CALCANEUS (Left: Heel) REPAIR, TENDON, ACHILLES (Left: Heel)  Patient Location: PACU  Anesthesia Type:General  Level of Consciousness: awake  Airway & Oxygen Therapy: Patient Spontanous Breathing and Patient connected to face mask oxygen  Post-op Assessment: Report given to RN and Post -op Vital signs reviewed and stable  Post vital signs: Reviewed and stable  Last Vitals:  Vitals Value Taken Time  BP 139/91 06/08/23 1130  Temp 36.5 C 06/08/23 1130  Pulse 57 06/08/23 1133  Resp 21 06/08/23 1133  SpO2 100 % 06/08/23 1133  Vitals shown include unfiled device data.  Last Pain:  Vitals:   06/08/23 0712  TempSrc: Oral  PainSc: 5          Complications: No notable events documented.

## 2023-06-09 NOTE — Anesthesia Postprocedure Evaluation (Signed)
 Anesthesia Post Note  Patient: Erik Parker  Procedure(s) Performed: EXCISION, BONE SPUR, CALCANEUS (Left: Heel) REPAIR, TENDON, ACHILLES (Left: Heel)  Patient location during evaluation: Phase II Anesthesia Type: General Level of consciousness: awake Pain management: pain level controlled Vital Signs Assessment: post-procedure vital signs reviewed and stable Respiratory status: spontaneous breathing and respiratory function stable Cardiovascular status: blood pressure returned to baseline and stable Postop Assessment: no headache and no apparent nausea or vomiting Anesthetic complications: no Comments: Late entry   No notable events documented.   Last Vitals:  Vitals:   06/08/23 1200 06/08/23 1239  BP: 132/71 134/71  Pulse: (!) 50 64  Resp: 17 20  Temp:  36.4 C  SpO2: 100% 99%    Last Pain:  Vitals:   06/08/23 1239  TempSrc: Oral  PainSc: 0-No pain                 Windell Norfolk

## 2023-06-10 ENCOUNTER — Encounter (HOSPITAL_COMMUNITY): Payer: Self-pay | Admitting: Podiatry

## 2023-06-13 DIAGNOSIS — Z4889 Encounter for other specified surgical aftercare: Secondary | ICD-10-CM | POA: Diagnosis not present

## 2023-06-27 DIAGNOSIS — Z4889 Encounter for other specified surgical aftercare: Secondary | ICD-10-CM | POA: Diagnosis not present

## 2023-07-04 DIAGNOSIS — Z4889 Encounter for other specified surgical aftercare: Secondary | ICD-10-CM | POA: Diagnosis not present

## 2023-07-11 DIAGNOSIS — T8130XA Disruption of wound, unspecified, initial encounter: Secondary | ICD-10-CM | POA: Diagnosis not present

## 2023-07-11 DIAGNOSIS — Z4889 Encounter for other specified surgical aftercare: Secondary | ICD-10-CM | POA: Diagnosis not present

## 2023-07-18 DIAGNOSIS — Z4889 Encounter for other specified surgical aftercare: Secondary | ICD-10-CM | POA: Diagnosis not present

## 2023-08-01 DIAGNOSIS — Z4889 Encounter for other specified surgical aftercare: Secondary | ICD-10-CM | POA: Diagnosis not present

## 2023-08-08 DIAGNOSIS — Z4889 Encounter for other specified surgical aftercare: Secondary | ICD-10-CM | POA: Diagnosis not present

## 2023-08-08 DIAGNOSIS — T8130XA Disruption of wound, unspecified, initial encounter: Secondary | ICD-10-CM | POA: Diagnosis not present

## 2023-08-12 ENCOUNTER — Other Ambulatory Visit: Payer: Self-pay | Admitting: Surgery

## 2023-08-12 DIAGNOSIS — I7101 Dissection of ascending aorta: Secondary | ICD-10-CM

## 2023-08-29 DIAGNOSIS — Z4889 Encounter for other specified surgical aftercare: Secondary | ICD-10-CM | POA: Diagnosis not present

## 2023-09-26 DIAGNOSIS — Z4889 Encounter for other specified surgical aftercare: Secondary | ICD-10-CM | POA: Diagnosis not present

## 2023-09-28 ENCOUNTER — Ambulatory Visit: Admitting: Surgery

## 2023-10-05 ENCOUNTER — Ambulatory Visit: Admitting: Surgery

## 2023-10-05 ENCOUNTER — Ambulatory Visit

## 2023-10-05 ENCOUNTER — Ambulatory Visit (HOSPITAL_COMMUNITY)
Admission: RE | Admit: 2023-10-05 | Discharge: 2023-10-05 | Disposition: A | Discharge status: Home / Self Care | Source: Ambulatory Visit | Attending: Cardiovascular Disease | Admitting: Cardiovascular Disease

## 2023-10-05 VITALS — BP 149/74 | HR 52 | Resp 18 | Ht 73.0 in | Wt 274.0 lb

## 2023-10-05 DIAGNOSIS — I7101 Dissection of ascending aorta: Secondary | ICD-10-CM | POA: Diagnosis not present

## 2023-10-05 DIAGNOSIS — I71011 Dissection of aortic arch: Secondary | ICD-10-CM | POA: Diagnosis not present

## 2023-10-05 DIAGNOSIS — I7779 Dissection of other artery: Secondary | ICD-10-CM | POA: Diagnosis not present

## 2023-10-05 DIAGNOSIS — I71019 Dissection of thoracic aorta, unspecified: Secondary | ICD-10-CM | POA: Diagnosis not present

## 2023-10-05 DIAGNOSIS — Z95828 Presence of other vascular implants and grafts: Secondary | ICD-10-CM | POA: Diagnosis not present

## 2023-10-05 MED ORDER — IOHEXOL 350 MG/ML SOLN
75.0000 mL | Freq: Once | INTRAVENOUS | Status: AC | PRN
  Administered 2023-10-05: 75 mL via INTRAVENOUS

## 2023-10-05 NOTE — Patient Instructions (Signed)
-  Continue control of blood pressure (prefer BP 130/80 or less) -Exercise and activity limitations is individualized, but in general, contact sports are to be avoided and one should avoid heavy lifting (defined as half of ideal body weight) and exercises involving sustained Valsalva maneuver. -Follow-up in one year with CTA of chest.

## 2023-10-05 NOTE — Progress Notes (Addendum)
 [address] [place] [address]             [phone]            Erik Parker [id number] 10/10/1965   History of Present Illness: Erik Parker is a 58 year old male with medical history of aortic dissection with surgical ascending aortic replacement, hypertension, acute cerebrovascular accident, dyslipidemia and tobacco abuse presents to the clinic for follow-up. CTA of chest 4 years after repair of a type a aortic dissection in March 2021.   His blood pressure slightly elevated at today's visit with systolic being in the 140s.  He reports that he does check his blood pressure at home and readings are usually in the 120s -130s/70s.  He is currently using amlodipine  and metoprolol  for blood pressure control.  He does lift weights for exercise but does not lift heavy.  Reports that he uses lighter weights with more reps.  He is a former smoker and quit in 2021.  He reports that today he is doing well and remains symptom-free he denies chest pain, shortness of breath and lower leg swelling.  He does report having some indigestion at times which causes slight chest discomfort and is resolved with Tums.  This does not happen frequently.   Current Outpatient Medications on File Prior to Visit  Medication Sig Dispense Refill   amLODipine  (NORVASC ) 10 MG tablet TAKE 1 TABLET BY MOUTH DAILY 90 tablet 3   aspirin  EC 325 MG EC tablet Take 1 tablet (325 mg total) by mouth daily. 30 tablet 0   atorvastatin  (LIPITOR) 20 MG tablet Take 1 tablet (20 mg total) by mouth daily. 90 tablet 3   ezetimibe  (ZETIA ) 10 MG tablet Take 1 tablet (10 mg total) by mouth daily. 90 tablet 3   metoprolol  tartrate (LOPRESSOR ) 25 MG tablet TAKE 1 TABLET BY MOUTH TWICE A DAY 180 tablet 1   Multiple Vitamin (MULTIVITAMIN WITH MINERALS) TABS tablet Take 1 tablet by mouth daily.     No current facility-administered medications on file prior to visit.     ROS: Review of Systems   Respiratory: Negative.  Negative for cough and shortness of breath.   Cardiovascular: Negative.  Negative for chest pain, palpitations and leg swelling.     BP (!) 149/74   Pulse (!) 52   Resp 18   Ht 6' 1 (1.854 m)   Wt 274 lb (124.3 kg)   SpO2 94%   BMI 36.15 kg/m   Physical Exam Constitutional:      Appearance: Normal appearance.  HENT:     Head: Normocephalic and atraumatic.  Cardiovascular:     Rate and Rhythm: Normal rate and regular rhythm.     Heart sounds: Normal heart sounds, S1 normal and S2 normal.  Pulmonary:     Effort: Pulmonary effort is normal.     Breath sounds: Normal breath sounds.  Skin:    General: Skin is warm and dry.  Neurological:     General: No focal deficit present.     Mental Status: He is alert and oriented to person, place, and time.      Imaging: CLINICAL DATA:  Follow-up thoracic aortic dissection   EXAM: CT ANGIOGRAPHY CHEST WITH CONTRAST   TECHNIQUE: Multidetector CT imaging of the chest was performed using the standard protocol during bolus administration of intravenous contrast. Multiplanar CT image reconstructions and MIPs were obtained to evaluate the vascular  anatomy.   RADIATION DOSE REDUCTION: This exam was performed according to the departmental dose-optimization program which includes automated exposure control, adjustment of the mA and/or kV according to patient size and/or use of iterative reconstruction technique.   CONTRAST:  75mL OMNIPAQUE  IOHEXOL  350 MG/ML SOLN   COMPARISON:  August 30, 2022   FINDINGS: Cardiovascular: Unchanged sequelae of prior ascending thoracic aortic repair with unchanged focal dissection flap in the aortic arch with dissection extending into the proximal left subclavian artery. Common origin of the brachiocephalic artery and left common carotid artery. The visualized great vessels are patent. Unchanged focal angulation in dilation of ascending thoracic aorta status post repair,  measuring up to 4.2 cm.   Mediastinum/Nodes: No lymphadenopathy.   Lungs/Pleura: Clear lungs.  No pleural effusion or pneumothorax.   Upper Abdomen: Unremarkable.   Musculoskeletal: No acute osseous findings. Status post sternotomy. In the   Review of the MIP images confirms the above findings.   IMPRESSION: 1. Unchanged sequela of prior ascending thoracic aortic repair with similar appearance of focal dissection in the aortic arch extending into the proximal left subclavian artery. No acute findings.     Electronically Signed   By: Michaeline Blanch M.D.   On: 10/16/2023 09:49    A/P: S/P ascending aortic replacement -Reviewed CTA scan with patient which shows intact repair of ascending aortic hemiarch graft. Discussed continuance of blood pressure control.  He is to report readings over 140s/90s to his PCP.   - Follow-up in 1 year with CTA of chest for continued surveillance of the aortic dissection repair    Erik CHRISTELLA Rough, PA-C 10/05/23

## 2023-11-28 DIAGNOSIS — Z4889 Encounter for other specified surgical aftercare: Secondary | ICD-10-CM | POA: Diagnosis not present

## 2023-12-16 ENCOUNTER — Ambulatory Visit: Admitting: Cardiology

## 2024-02-27 DIAGNOSIS — Z4889 Encounter for other specified surgical aftercare: Secondary | ICD-10-CM | POA: Diagnosis not present

## 2024-03-06 ENCOUNTER — Other Ambulatory Visit: Payer: Self-pay | Admitting: Cardiology

## 2024-04-26 ENCOUNTER — Ambulatory Visit: Admitting: Cardiology
